# Patient Record
Sex: Female | Born: 1960
Health system: Southern US, Community
[De-identification: ages and names within clinical notes are randomized; demographics above are authoritative.]

## PROBLEM LIST (undated history)

## (undated) DIAGNOSIS — E119 Type 2 diabetes mellitus without complications: Secondary | ICD-10-CM

## (undated) DIAGNOSIS — I10 Essential (primary) hypertension: Secondary | ICD-10-CM

## (undated) DIAGNOSIS — E785 Hyperlipidemia, unspecified: Secondary | ICD-10-CM

## (undated) DIAGNOSIS — Z8719 Personal history of other diseases of the digestive system: Secondary | ICD-10-CM

## (undated) DIAGNOSIS — Z973 Presence of spectacles and contact lenses: Secondary | ICD-10-CM

## (undated) DIAGNOSIS — K589 Irritable bowel syndrome without diarrhea: Secondary | ICD-10-CM

## (undated) DIAGNOSIS — Z86010 Personal history of colonic polyps: Secondary | ICD-10-CM

## (undated) DIAGNOSIS — G4733 Obstructive sleep apnea (adult) (pediatric): Secondary | ICD-10-CM

## (undated) DIAGNOSIS — Z860101 Personal history of adenomatous and serrated colon polyps: Secondary | ICD-10-CM

## (undated) DIAGNOSIS — R112 Nausea with vomiting, unspecified: Secondary | ICD-10-CM

## (undated) DIAGNOSIS — F419 Anxiety disorder, unspecified: Secondary | ICD-10-CM

## (undated) DIAGNOSIS — T7840XA Allergy, unspecified, initial encounter: Secondary | ICD-10-CM

## (undated) DIAGNOSIS — K219 Gastro-esophageal reflux disease without esophagitis: Secondary | ICD-10-CM

## (undated) DIAGNOSIS — M6530 Trigger finger, unspecified finger: Secondary | ICD-10-CM

## (undated) DIAGNOSIS — I499 Cardiac arrhythmia, unspecified: Secondary | ICD-10-CM

## (undated) DIAGNOSIS — E1165 Type 2 diabetes mellitus with hyperglycemia: Secondary | ICD-10-CM

## (undated) DIAGNOSIS — G5602 Carpal tunnel syndrome, left upper limb: Secondary | ICD-10-CM

## (undated) DIAGNOSIS — Z8742 Personal history of other diseases of the female genital tract: Secondary | ICD-10-CM

## (undated) DIAGNOSIS — L121 Cicatricial pemphigoid: Secondary | ICD-10-CM

## (undated) DIAGNOSIS — R911 Solitary pulmonary nodule: Secondary | ICD-10-CM

## (undated) DIAGNOSIS — Z9889 Other specified postprocedural states: Secondary | ICD-10-CM

## (undated) DIAGNOSIS — D3500 Benign neoplasm of unspecified adrenal gland: Secondary | ICD-10-CM

## (undated) HISTORY — DX: Essential (primary) hypertension: I10

## (undated) HISTORY — DX: Cicatricial pemphigoid: L12.1

## (undated) HISTORY — DX: Anxiety disorder, unspecified: F41.9

## (undated) HISTORY — DX: Allergy, unspecified, initial encounter: T78.40XA

## (undated) HISTORY — DX: Hyperlipidemia, unspecified: E78.5

## (undated) HISTORY — PX: POLYPECTOMY: SHX149

## (undated) HISTORY — DX: Cardiac arrhythmia, unspecified: I49.9

---
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
## 1987-11-08 HISTORY — PX: CHOLECYSTECTOMY: SHX55

## 1998-11-07 HISTORY — PX: TUBAL LIGATION: SHX77

## 1999-04-15 ENCOUNTER — Other Ambulatory Visit: Admission: RE | Admit: 1999-04-15 | Discharge: 1999-04-15 | Payer: Self-pay | Admitting: Family Medicine

## 1999-07-29 ENCOUNTER — Other Ambulatory Visit: Admission: RE | Admit: 1999-07-29 | Discharge: 1999-07-29 | Payer: Self-pay | Admitting: Family Medicine

## 2001-07-05 ENCOUNTER — Encounter: Payer: Self-pay | Admitting: Unknown Physician Specialty

## 2001-07-05 ENCOUNTER — Ambulatory Visit (HOSPITAL_COMMUNITY): Admission: RE | Admit: 2001-07-05 | Discharge: 2001-07-05 | Payer: Self-pay | Admitting: Unknown Physician Specialty

## 2002-03-14 ENCOUNTER — Other Ambulatory Visit: Admission: RE | Admit: 2002-03-14 | Discharge: 2002-03-14 | Payer: Self-pay | Admitting: Unknown Physician Specialty

## 2002-11-04 ENCOUNTER — Ambulatory Visit: Admission: RE | Admit: 2002-11-04 | Discharge: 2002-11-04 | Payer: Self-pay | Admitting: Pulmonary Disease

## 2003-01-08 ENCOUNTER — Encounter: Payer: Self-pay | Admitting: Unknown Physician Specialty

## 2003-01-08 ENCOUNTER — Ambulatory Visit (HOSPITAL_COMMUNITY): Admission: RE | Admit: 2003-01-08 | Discharge: 2003-01-08 | Payer: Self-pay | Admitting: Unknown Physician Specialty

## 2003-01-08 ENCOUNTER — Ambulatory Visit (HOSPITAL_COMMUNITY): Admission: RE | Admit: 2003-01-08 | Discharge: 2003-01-08 | Payer: Self-pay | Admitting: Family Medicine

## 2003-01-08 ENCOUNTER — Encounter: Payer: Self-pay | Admitting: Family Medicine

## 2003-04-30 ENCOUNTER — Encounter (HOSPITAL_COMMUNITY): Admission: RE | Admit: 2003-04-30 | Discharge: 2003-05-30 | Payer: Self-pay | Admitting: *Deleted

## 2003-04-30 ENCOUNTER — Encounter: Payer: Self-pay | Admitting: *Deleted

## 2003-04-30 HISTORY — PX: CARDIOVASCULAR STRESS TEST: SHX262

## 2003-04-30 HISTORY — PX: TRANSTHORACIC ECHOCARDIOGRAM: SHX275

## 2004-01-15 ENCOUNTER — Ambulatory Visit (HOSPITAL_COMMUNITY): Admission: RE | Admit: 2004-01-15 | Discharge: 2004-01-15 | Payer: Self-pay | Admitting: Unknown Physician Specialty

## 2004-08-04 ENCOUNTER — Other Ambulatory Visit: Admission: RE | Admit: 2004-08-04 | Discharge: 2004-08-04 | Payer: Self-pay | Admitting: Dermatology

## 2004-10-06 ENCOUNTER — Ambulatory Visit: Payer: Self-pay | Admitting: Family Medicine

## 2004-10-06 ENCOUNTER — Ambulatory Visit (HOSPITAL_COMMUNITY): Admission: RE | Admit: 2004-10-06 | Discharge: 2004-10-06 | Payer: Self-pay | Admitting: Family Medicine

## 2005-01-13 ENCOUNTER — Ambulatory Visit: Payer: Self-pay | Admitting: Family Medicine

## 2005-01-13 ENCOUNTER — Ambulatory Visit (HOSPITAL_COMMUNITY): Admission: RE | Admit: 2005-01-13 | Discharge: 2005-01-13 | Payer: Self-pay | Admitting: Unknown Physician Specialty

## 2005-01-28 ENCOUNTER — Ambulatory Visit (HOSPITAL_COMMUNITY): Admission: RE | Admit: 2005-01-28 | Discharge: 2005-01-28 | Payer: Self-pay | Admitting: Unknown Physician Specialty

## 2005-03-23 ENCOUNTER — Ambulatory Visit: Payer: Self-pay | Admitting: Family Medicine

## 2005-04-27 ENCOUNTER — Ambulatory Visit: Payer: Self-pay | Admitting: Family Medicine

## 2005-06-13 ENCOUNTER — Ambulatory Visit: Payer: Self-pay | Admitting: Cardiology

## 2005-07-25 ENCOUNTER — Ambulatory Visit: Payer: Self-pay | Admitting: Family Medicine

## 2005-11-24 ENCOUNTER — Ambulatory Visit: Payer: Self-pay | Admitting: Family Medicine

## 2005-11-24 ENCOUNTER — Ambulatory Visit (HOSPITAL_COMMUNITY): Admission: RE | Admit: 2005-11-24 | Discharge: 2005-11-24 | Payer: Self-pay | Admitting: *Deleted

## 2005-11-24 ENCOUNTER — Ambulatory Visit: Payer: Self-pay | Admitting: *Deleted

## 2006-04-04 ENCOUNTER — Ambulatory Visit: Payer: Self-pay | Admitting: Family Medicine

## 2006-04-05 ENCOUNTER — Ambulatory Visit (HOSPITAL_COMMUNITY): Admission: RE | Admit: 2006-04-05 | Discharge: 2006-04-05 | Payer: Self-pay | Admitting: Unknown Physician Specialty

## 2006-05-15 ENCOUNTER — Ambulatory Visit: Payer: Self-pay | Admitting: Family Medicine

## 2006-05-16 ENCOUNTER — Ambulatory Visit (HOSPITAL_COMMUNITY): Admission: RE | Admit: 2006-05-16 | Discharge: 2006-05-16 | Payer: Self-pay | Admitting: Family Medicine

## 2006-08-04 ENCOUNTER — Ambulatory Visit: Payer: Self-pay | Admitting: Family Medicine

## 2006-08-23 ENCOUNTER — Encounter: Payer: Self-pay | Admitting: Family Medicine

## 2006-08-24 ENCOUNTER — Ambulatory Visit (HOSPITAL_COMMUNITY): Payer: Self-pay | Admitting: Psychiatry

## 2007-01-03 ENCOUNTER — Ambulatory Visit: Payer: Self-pay | Admitting: Family Medicine

## 2007-01-11 ENCOUNTER — Encounter: Payer: Self-pay | Admitting: Family Medicine

## 2007-01-11 LAB — CONVERTED CEMR LAB
ALT: 30 units/L (ref 0–35)
Albumin: 4.8 g/dL (ref 3.5–5.2)
Alkaline Phosphatase: 65 units/L (ref 39–117)
BUN: 14 mg/dL (ref 6–23)
Bilirubin, Direct: 0.1 mg/dL (ref 0.0–0.3)
CO2: 28 meq/L (ref 19–32)
Chloride: 102 meq/L (ref 96–112)
Cholesterol: 176 mg/dL (ref 0–200)
Hgb A1c MFr Bld: 7.1 % — ABNORMAL HIGH (ref 4.6–6.1)
Indirect Bilirubin: 0.3 mg/dL (ref 0.0–0.9)
LDL Cholesterol: 83 mg/dL (ref 0–99)
Potassium: 4.3 meq/L (ref 3.5–5.3)
Total Protein: 6.8 g/dL (ref 6.0–8.3)
VLDL: 45 mg/dL — ABNORMAL HIGH (ref 0–40)

## 2007-04-03 ENCOUNTER — Ambulatory Visit: Payer: Self-pay | Admitting: Family Medicine

## 2007-07-31 ENCOUNTER — Ambulatory Visit: Payer: Self-pay | Admitting: Family Medicine

## 2007-07-31 LAB — CONVERTED CEMR LAB: Blood Glucose, Fasting: 141 mg/dL

## 2007-12-06 ENCOUNTER — Encounter: Payer: Self-pay | Admitting: Family Medicine

## 2007-12-06 DIAGNOSIS — F411 Generalized anxiety disorder: Secondary | ICD-10-CM

## 2007-12-06 DIAGNOSIS — F419 Anxiety disorder, unspecified: Secondary | ICD-10-CM | POA: Insufficient documentation

## 2007-12-06 DIAGNOSIS — F332 Major depressive disorder, recurrent severe without psychotic features: Secondary | ICD-10-CM | POA: Insufficient documentation

## 2008-01-17 ENCOUNTER — Ambulatory Visit: Payer: Self-pay | Admitting: Family Medicine

## 2008-01-18 ENCOUNTER — Encounter: Payer: Self-pay | Admitting: Family Medicine

## 2008-01-23 ENCOUNTER — Ambulatory Visit (HOSPITAL_COMMUNITY): Admission: RE | Admit: 2008-01-23 | Discharge: 2008-01-23 | Payer: Self-pay | Admitting: Family Medicine

## 2008-01-25 ENCOUNTER — Encounter: Payer: Self-pay | Admitting: Family Medicine

## 2008-01-25 LAB — CONVERTED CEMR LAB
ALT: 42 units/L — ABNORMAL HIGH (ref 0–35)
AST: 23 units/L (ref 0–37)
Albumin: 4.6 g/dL (ref 3.5–5.2)
Alkaline Phosphatase: 75 units/L (ref 39–117)
CO2: 24 meq/L (ref 19–32)
Chloride: 100 meq/L (ref 96–112)
Cholesterol: 269 mg/dL — ABNORMAL HIGH (ref 0–200)
Eosinophils Absolute: 0.2 10*3/uL (ref 0.0–0.7)
Glucose, Bld: 155 mg/dL — ABNORMAL HIGH (ref 70–99)
HCT: 43.2 % (ref 36.0–46.0)
Hemoglobin: 14.3 g/dL (ref 12.0–15.0)
Indirect Bilirubin: 0.4 mg/dL (ref 0.0–0.9)
LDL Cholesterol: 169 mg/dL — ABNORMAL HIGH (ref 0–99)
Monocytes Absolute: 0.7 10*3/uL (ref 0.1–1.0)
Potassium: 4.4 meq/L (ref 3.5–5.3)
RBC: 4.65 M/uL (ref 3.87–5.11)
Sodium: 138 meq/L (ref 135–145)
TSH: 2.028 microintl units/mL (ref 0.350–5.50)
Total Protein: 7.1 g/dL (ref 6.0–8.3)
VLDL: 52 mg/dL — ABNORMAL HIGH (ref 0–40)
WBC: 10.5 10*3/uL (ref 4.0–10.5)

## 2008-03-20 ENCOUNTER — Ambulatory Visit: Payer: Self-pay | Admitting: Family Medicine

## 2008-03-24 ENCOUNTER — Ambulatory Visit: Payer: Self-pay | Admitting: Family Medicine

## 2008-08-11 ENCOUNTER — Encounter: Payer: Self-pay | Admitting: Family Medicine

## 2008-08-26 ENCOUNTER — Encounter: Payer: Self-pay | Admitting: Family Medicine

## 2008-09-15 ENCOUNTER — Ambulatory Visit: Payer: Self-pay | Admitting: Family Medicine

## 2008-09-15 ENCOUNTER — Ambulatory Visit (HOSPITAL_COMMUNITY): Admission: RE | Admit: 2008-09-15 | Discharge: 2008-09-15 | Payer: Self-pay | Admitting: Family Medicine

## 2008-09-15 DIAGNOSIS — J441 Chronic obstructive pulmonary disease with (acute) exacerbation: Secondary | ICD-10-CM | POA: Insufficient documentation

## 2008-09-15 LAB — CONVERTED CEMR LAB
Glucose, Bld: 240 mg/dL
Hgb A1c MFr Bld: 6.8 %

## 2008-09-16 LAB — CONVERTED CEMR LAB
Eosinophils Absolute: 0.3 10*3/uL (ref 0.0–0.7)
Monocytes Absolute: 1 10*3/uL (ref 0.1–1.0)
Monocytes Relative: 8 % (ref 3–12)
RDW: 12.8 % (ref 11.5–15.5)
WBC: 11.7 10*3/uL — ABNORMAL HIGH (ref 4.0–10.5)

## 2008-10-07 ENCOUNTER — Telehealth: Payer: Self-pay | Admitting: Family Medicine

## 2008-12-10 ENCOUNTER — Telehealth: Payer: Self-pay | Admitting: Family Medicine

## 2008-12-19 ENCOUNTER — Ambulatory Visit (HOSPITAL_COMMUNITY): Admission: RE | Admit: 2008-12-19 | Discharge: 2008-12-19 | Payer: Self-pay | Admitting: Family Medicine

## 2009-09-02 ENCOUNTER — Encounter: Payer: Self-pay | Admitting: Family Medicine

## 2009-09-14 ENCOUNTER — Telehealth: Payer: Self-pay | Admitting: Family Medicine

## 2009-10-09 ENCOUNTER — Encounter: Payer: Self-pay | Admitting: Family Medicine

## 2009-10-13 ENCOUNTER — Encounter: Payer: Self-pay | Admitting: Family Medicine

## 2009-10-14 ENCOUNTER — Encounter: Payer: Self-pay | Admitting: Family Medicine

## 2009-10-14 LAB — CONVERTED CEMR LAB
AST: 47 units/L — ABNORMAL HIGH (ref 0–37)
Albumin: 4.6 g/dL (ref 3.5–5.2)
Basophils Relative: 1 % (ref 0–1)
Calcium: 9 mg/dL (ref 8.4–10.5)
Chloride: 101 meq/L (ref 96–112)
Cholesterol: 236 mg/dL — ABNORMAL HIGH (ref 0–200)
Creatinine, Ser: 0.69 mg/dL (ref 0.40–1.20)
Eosinophils Absolute: 0.2 10*3/uL (ref 0.0–0.7)
Eosinophils Relative: 3 % (ref 0–5)
HCT: 42.9 % (ref 36.0–46.0)
HCV Ab: NEGATIVE
Hemoglobin: 14.3 g/dL (ref 12.0–15.0)
LDL Cholesterol: 150 mg/dL — ABNORMAL HIGH (ref 0–99)
MCHC: 33.3 g/dL (ref 30.0–36.0)
MCV: 91.9 fL (ref 78.0–100.0)
Microalb Creat Ratio: 15.6 mg/g (ref 0.0–30.0)
Microalb, Ur: 0.99 mg/dL (ref 0.00–1.89)
Monocytes Absolute: 0.5 10*3/uL (ref 0.1–1.0)
Neutrophils Relative %: 69 % (ref 43–77)
Potassium: 4.3 meq/L (ref 3.5–5.3)
Sodium: 140 meq/L (ref 135–145)
Total CHOL/HDL Ratio: 4.9
Total Protein: 6.7 g/dL (ref 6.0–8.3)
VLDL: 38 mg/dL (ref 0–40)
WBC: 8.1 10*3/uL (ref 4.0–10.5)

## 2009-10-16 ENCOUNTER — Ambulatory Visit: Payer: Self-pay | Admitting: Family Medicine

## 2009-10-16 DIAGNOSIS — R740 Nonspecific elevation of levels of transaminase and lactic acid dehydrogenase [LDH]: Secondary | ICD-10-CM

## 2009-10-16 DIAGNOSIS — R7401 Elevation of levels of liver transaminase levels: Secondary | ICD-10-CM | POA: Insufficient documentation

## 2009-10-16 LAB — CONVERTED CEMR LAB
Glucose, Bld: 159 mg/dL
Hgb A1c MFr Bld: 7.6 %
TSH: 1.349 microintl units/mL (ref 0.350–4.500)

## 2009-10-27 ENCOUNTER — Ambulatory Visit (HOSPITAL_COMMUNITY): Admission: RE | Admit: 2009-10-27 | Discharge: 2009-10-27 | Payer: Self-pay | Admitting: Family Medicine

## 2009-10-27 ENCOUNTER — Encounter: Payer: Self-pay | Admitting: Gastroenterology

## 2009-10-28 ENCOUNTER — Encounter: Payer: Self-pay | Admitting: Gastroenterology

## 2009-11-04 ENCOUNTER — Ambulatory Visit: Payer: Self-pay | Admitting: Gastroenterology

## 2009-11-04 ENCOUNTER — Ambulatory Visit (HOSPITAL_COMMUNITY): Admission: RE | Admit: 2009-11-04 | Discharge: 2009-11-04 | Payer: Self-pay | Admitting: Gastroenterology

## 2009-11-04 HISTORY — PX: COLONOSCOPY: SHX174

## 2010-01-25 ENCOUNTER — Telehealth: Payer: Self-pay | Admitting: Family Medicine

## 2010-04-27 ENCOUNTER — Telehealth: Payer: Self-pay | Admitting: Family Medicine

## 2010-04-30 ENCOUNTER — Ambulatory Visit: Payer: Self-pay | Admitting: Family Medicine

## 2010-04-30 ENCOUNTER — Encounter (INDEPENDENT_AMBULATORY_CARE_PROVIDER_SITE_OTHER): Payer: Self-pay | Admitting: *Deleted

## 2010-04-30 DIAGNOSIS — R0789 Other chest pain: Secondary | ICD-10-CM | POA: Insufficient documentation

## 2010-04-30 DIAGNOSIS — R079 Chest pain, unspecified: Secondary | ICD-10-CM | POA: Insufficient documentation

## 2010-04-30 LAB — CONVERTED CEMR LAB
AST: 32 units/L
AST: 32 units/L
Albumin: 4.6 g/dL
BUN: 13 mg/dL
Bilirubin, Direct: 0.1 mg/dL
CO2: 27 meq/L
CO2: 27 meq/L
Chloride: 101 meq/L
Chloride: 101 meq/L
Cholesterol: 248 mg/dL
HDL: 44 mg/dL
LDL Cholesterol: 161 mg/dL
Potassium: 4.4 meq/L
Sodium: 141 meq/L
Sodium: 141 meq/L
Triglycerides: 217 mg/dL
Triglycerides: 217 mg/dL

## 2010-05-02 DIAGNOSIS — E1169 Type 2 diabetes mellitus with other specified complication: Secondary | ICD-10-CM | POA: Insufficient documentation

## 2010-05-02 DIAGNOSIS — E669 Obesity, unspecified: Secondary | ICD-10-CM

## 2010-05-02 DIAGNOSIS — E1159 Type 2 diabetes mellitus with other circulatory complications: Secondary | ICD-10-CM | POA: Insufficient documentation

## 2010-05-02 DIAGNOSIS — Z87891 Personal history of nicotine dependence: Secondary | ICD-10-CM | POA: Insufficient documentation

## 2010-05-02 DIAGNOSIS — E785 Hyperlipidemia, unspecified: Secondary | ICD-10-CM | POA: Insufficient documentation

## 2010-05-02 DIAGNOSIS — J309 Allergic rhinitis, unspecified: Secondary | ICD-10-CM | POA: Insufficient documentation

## 2010-05-04 ENCOUNTER — Encounter (INDEPENDENT_AMBULATORY_CARE_PROVIDER_SITE_OTHER): Payer: Self-pay | Admitting: *Deleted

## 2010-05-06 ENCOUNTER — Encounter (INDEPENDENT_AMBULATORY_CARE_PROVIDER_SITE_OTHER): Payer: Self-pay | Admitting: *Deleted

## 2010-05-06 ENCOUNTER — Telehealth: Payer: Self-pay | Admitting: Family Medicine

## 2010-05-06 ENCOUNTER — Ambulatory Visit: Payer: Self-pay | Admitting: Cardiology

## 2010-05-06 DIAGNOSIS — E782 Mixed hyperlipidemia: Secondary | ICD-10-CM | POA: Insufficient documentation

## 2010-05-06 LAB — CONVERTED CEMR LAB
ALT: 54 units/L — ABNORMAL HIGH (ref 0–35)
AST: 32 units/L (ref 0–37)
Albumin: 4.6 g/dL (ref 3.5–5.2)
BUN: 13 mg/dL (ref 6–23)
CO2: 27 meq/L (ref 19–32)
Calcium: 9.4 mg/dL (ref 8.4–10.5)
Chloride: 101 meq/L (ref 96–112)
Glucose, Bld: 107 mg/dL — ABNORMAL HIGH (ref 70–99)
HDL: 44 mg/dL (ref >39)
Hgb A1c MFr Bld: 7.1 % — ABNORMAL HIGH (ref <5.7)
Indirect Bilirubin: 0.2 mg/dL (ref 0.0–0.9)
LDL Cholesterol: 161 mg/dL — ABNORMAL HIGH (ref 0–99)
Total Bilirubin: 0.3 mg/dL (ref 0.3–1.2)
Total Protein: 6.8 g/dL (ref 6.0–8.3)

## 2010-05-17 ENCOUNTER — Ambulatory Visit (HOSPITAL_COMMUNITY): Admission: RE | Admit: 2010-05-17 | Discharge: 2010-05-17 | Payer: Self-pay | Admitting: Cardiology

## 2010-05-17 ENCOUNTER — Encounter: Payer: Self-pay | Admitting: Cardiology

## 2010-05-17 ENCOUNTER — Ambulatory Visit: Payer: Self-pay | Admitting: Cardiology

## 2010-06-15 ENCOUNTER — Ambulatory Visit: Payer: Self-pay | Admitting: Psychology

## 2010-06-23 ENCOUNTER — Encounter (INDEPENDENT_AMBULATORY_CARE_PROVIDER_SITE_OTHER): Payer: Self-pay | Admitting: *Deleted

## 2010-06-24 ENCOUNTER — Ambulatory Visit: Payer: Self-pay | Admitting: Cardiology

## 2010-06-24 ENCOUNTER — Encounter (INDEPENDENT_AMBULATORY_CARE_PROVIDER_SITE_OTHER): Payer: Self-pay | Admitting: *Deleted

## 2010-07-02 ENCOUNTER — Ambulatory Visit: Payer: Self-pay | Admitting: Psychology

## 2010-07-30 ENCOUNTER — Ambulatory Visit: Payer: Self-pay | Admitting: Family Medicine

## 2010-08-02 ENCOUNTER — Encounter: Payer: Self-pay | Admitting: Family Medicine

## 2010-08-02 LAB — CONVERTED CEMR LAB: Hepatitis B Surface Ag: NEGATIVE

## 2010-08-03 LAB — CONVERTED CEMR LAB
ALT: 78 units/L — ABNORMAL HIGH (ref 0–35)
AST: 60 units/L — ABNORMAL HIGH (ref 0–37)
BUN: 12 mg/dL (ref 6–23)
Bilirubin, Direct: 0.1 mg/dL (ref 0.0–0.3)
Calcium: 9.2 mg/dL (ref 8.4–10.5)
Chloride: 100 meq/L (ref 96–112)
Cholesterol: 266 mg/dL — ABNORMAL HIGH (ref 0–200)
Creatinine, Ser: 0.68 mg/dL (ref 0.40–1.20)
HDL: 51 mg/dL (ref >39)
Hgb A1c MFr Bld: 7.9 % — ABNORMAL HIGH (ref <5.7)
Potassium: 4.1 meq/L (ref 3.5–5.3)
Total Bilirubin: 0.5 mg/dL (ref 0.3–1.2)
Total CHOL/HDL Ratio: 5.2
Triglycerides: 238 mg/dL — ABNORMAL HIGH (ref <150)

## 2010-08-04 ENCOUNTER — Telehealth: Payer: Self-pay | Admitting: Family Medicine

## 2010-08-09 ENCOUNTER — Encounter: Payer: Self-pay | Admitting: Family Medicine

## 2010-08-18 ENCOUNTER — Ambulatory Visit: Payer: Self-pay | Admitting: Family Medicine

## 2010-08-18 DIAGNOSIS — B009 Herpesviral infection, unspecified: Secondary | ICD-10-CM | POA: Insufficient documentation

## 2010-08-20 ENCOUNTER — Encounter: Payer: Self-pay | Admitting: Family Medicine

## 2010-08-23 ENCOUNTER — Encounter (INDEPENDENT_AMBULATORY_CARE_PROVIDER_SITE_OTHER): Payer: Self-pay | Admitting: *Deleted

## 2010-10-05 ENCOUNTER — Encounter (INDEPENDENT_AMBULATORY_CARE_PROVIDER_SITE_OTHER): Payer: Self-pay | Admitting: *Deleted

## 2010-11-02 ENCOUNTER — Telehealth: Payer: Self-pay | Admitting: Family Medicine

## 2010-11-04 ENCOUNTER — Ambulatory Visit: Payer: Self-pay | Admitting: Family Medicine

## 2010-11-05 ENCOUNTER — Encounter: Payer: Self-pay | Admitting: Family Medicine

## 2010-11-05 LAB — CONVERTED CEMR LAB
ALT: 64 units/L — ABNORMAL HIGH (ref 0–35)
AST: 36 units/L (ref 0–37)
Albumin: 4.7 g/dL (ref 3.5–5.2)
Alkaline Phosphatase: 72 units/L (ref 39–117)
BUN: 10 mg/dL (ref 6–23)
Basophils Absolute: 0.1 10*3/uL (ref 0.0–0.1)
Basophils Relative: 1 % (ref 0–1)
Bilirubin, Direct: 0.1 mg/dL (ref 0.0–0.3)
CO2: 27 meq/L (ref 19–32)
Calcium: 10 mg/dL (ref 8.4–10.5)
Chloride: 100 meq/L (ref 96–112)
Cholesterol: 245 mg/dL — ABNORMAL HIGH (ref 0–200)
Creatinine, Ser: 0.63 mg/dL (ref 0.40–1.20)
Creatinine, Urine: 87.3 mg/dL
Eosinophils Absolute: 0.2 10*3/uL (ref 0.0–0.7)
Eosinophils Relative: 2 % (ref 0–5)
Glucose, Bld: 143 mg/dL — ABNORMAL HIGH (ref 70–99)
HCT: 42.4 % (ref 36.0–46.0)
HDL: 50 mg/dL (ref >39)
Hemoglobin: 13.8 g/dL (ref 12.0–15.0)
Indirect Bilirubin: 0.4 mg/dL (ref 0.0–0.9)
LDL Cholesterol: 155 mg/dL — ABNORMAL HIGH (ref 0–99)
Lymphocytes Relative: 20 % (ref 12–46)
Lymphs Abs: 2 10*3/uL (ref 0.7–4.0)
MCHC: 32.5 g/dL (ref 30.0–36.0)
MCV: 94.6 fL (ref 78.0–100.0)
Microalb Creat Ratio: 9.4 mg/g (ref 0.0–30.0)
Microalb, Ur: 0.82 mg/dL (ref 0.00–1.89)
Monocytes Absolute: 0.6 10*3/uL (ref 0.1–1.0)
Monocytes Relative: 7 % (ref 3–12)
Neutro Abs: 7 10*3/uL (ref 1.7–7.7)
Neutrophils Relative %: 71 % (ref 43–77)
Platelets: 375 10*3/uL (ref 150–400)
Potassium: 4.3 meq/L (ref 3.5–5.3)
RBC: 4.48 M/uL (ref 3.87–5.11)
RDW: 14.4 % (ref 11.5–15.5)
Sodium: 138 meq/L (ref 135–145)
TSH: 1.344 microintl units/mL (ref 0.350–4.500)
Total Bilirubin: 0.5 mg/dL (ref 0.3–1.2)
Total CHOL/HDL Ratio: 4.9
Total Protein: 7.1 g/dL (ref 6.0–8.3)
Triglycerides: 198 mg/dL — ABNORMAL HIGH (ref <150)
VLDL: 40 mg/dL (ref 0–40)
WBC: 9.9 10*3/uL (ref 4.0–10.5)

## 2010-11-09 ENCOUNTER — Ambulatory Visit (HOSPITAL_COMMUNITY): Admission: RE | Admit: 2010-11-09 | Payer: Self-pay | Source: Home / Self Care | Admitting: Family Medicine

## 2010-11-09 ENCOUNTER — Encounter: Payer: Self-pay | Admitting: Family Medicine

## 2010-11-09 LAB — CONVERTED CEMR LAB: Hgb A1c MFr Bld: 8.7 % — ABNORMAL HIGH (ref <5.7)

## 2010-11-18 ENCOUNTER — Encounter: Payer: Self-pay | Admitting: Family Medicine

## 2010-11-28 ENCOUNTER — Encounter: Payer: Self-pay | Admitting: *Deleted

## 2010-11-28 ENCOUNTER — Encounter: Payer: Self-pay | Admitting: Family Medicine

## 2010-11-28 ENCOUNTER — Encounter: Payer: Self-pay | Admitting: Unknown Physician Specialty

## 2010-12-02 ENCOUNTER — Encounter: Payer: Self-pay | Admitting: Family Medicine

## 2010-12-07 ENCOUNTER — Encounter: Payer: Self-pay | Admitting: Family Medicine

## 2010-12-07 NOTE — Miscellaneous (Signed)
Summary: labs bmp,lipids,liver,hemoglobin,04/30/2010  Clinical Lists Changes  Observations: Added new observation of CALCIUM: 9.4 mg/dL (47/82/9562 13:08) Added new observation of ALBUMIN: 4.6 g/dL (65/78/4696 29:52) Added new observation of PROTEIN, TOT: 6.8 g/dL (84/13/2440 10:27) Added new observation of SGPT (ALT): 54 units/L (04/30/2010 16:24) Added new observation of SGOT (AST): 32 units/L (04/30/2010 16:24) Added new observation of ALK PHOS: 63 units/L (04/30/2010 16:24) Added new observation of BILI DIRECT: 0.1 mg/dL (25/36/6440 34:74) Added new observation of CREATININE: 0.74 mg/dL (25/95/6387 56:43) Added new observation of BUN: 13 mg/dL (32/95/1884 16:60) Added new observation of BG RANDOM: 107 mg/dL (63/11/6008 93:23) Added new observation of CO2 PLSM/SER: 27 meq/L (04/30/2010 16:24) Added new observation of CL SERUM: 101 meq/L (04/30/2010 16:24) Added new observation of K SERUM: 4.4 meq/L (04/30/2010 16:24) Added new observation of NA: 141 meq/L (04/30/2010 16:24) Added new observation of LDL: 161 mg/dL (55/73/2202 54:27) Added new observation of HDL: 44 mg/dL (04/30/7627 31:51) Added new observation of TRIGLYC TOT: 217 mg/dL (76/16/0737 10:62) Added new observation of CHOLESTEROL: 248 mg/dL (69/48/5462 70:35) Added new observation of HGBA1C: 7.1 % (04/30/2010 16:24)

## 2010-12-07 NOTE — Letter (Signed)
Summary: Stress Echocardiogram Information Sheet  [facility]  [address] [place] [address]   Phone: [phone]  Fax: [phone]      May 06, 2010 MRN: [id number] light prior to the test.   Alicia Neal  Doctor: Appointment Date: Appointment Time: Appointment Location: [facility]  Stress Echocardiogram Information Sheet    Instructions:   1. DO NOT  take your am  medicine morning  before the test.  2. Nothing to eat or drink after midnight  3. Dress prepared to exercise.  4. DO NOT use ANY caffine or tobacco products 3 hours before appointment.  5. Report to the Short Stay Center on the1st floor.  6. Please bring all current prescription medications.  7. If you have any questions, please call [phone]

## 2010-12-07 NOTE — Miscellaneous (Signed)
Summary: echo 05/17/2010  Clinical Lists Changes  Observations: Added new observation of ECHOINTERP:  Study Conclusions    - Stress ECG conclusions: There were no stress arrhythmias or     conduction abnormalities. Insignificant upsloping ST segment     depression present. The stress ECG was negative for ischemia.   - Staged echo: There was no echocardiographic evidence for     stress-induced ischemia.   Bruce protocol. Stress echocardiography. Height: Height: 162.6cm.   Height: 64in. Weight: Weight: 85.5kg. Weight: 188lb. Body mass   index: BMI: 32.3kg/m^2. Body surface area: BSA: 16m^2. Blood   pressure: 140/82. Patient status: Outpatient.    --------------------------------------------------------------------  (05/17/2010 14:32) Added new observation of COLONOSCOPY:  RECOMMENDATIONS:   1. Will call Ms. Groeneveld with the results of her biopsies.   2. She should follow a high-fiber and low-fat diet.  She is given a       handout on high-fiber and low-fat diet, polyps, hemorrhoids,       gastritis, and reflux.   3. Screening colonoscopy in 10 years with propofol and Entrada       overtube.   4. No aspirin, NSAIDs or anticoagulation for 7 days.   5. Add omeprazole 20 mg 30 minutes prior to her first meal.      MEDICATIONS:   1. Demerol 125 mg IV.   2. Versed 8 mg IV.      DESCRIPTION OF PROCEDURE:  Physical exam was performed.  Informed   consent was obtained for the patient after explaining the benefits,   risks and alternatives to the procedure.  The patient was connected to   monitor placed in left lateral position.  Continuous oxygen was provided   by nasal cannula and IV medicine administered through an indwelling   cannula.  After administration of sedation and rectal exam, the   patient's rectum is intubated.  The scope was advanced under direct   visualization to the cecum.  Due to inability to straighten the scope to   deploy a snare to remove the cecal polyp, the scope  was withdrawn.  The   Entrada overtube was  placed over the scope.  The scope was advanced   under direct visualization to 70 cm from the anal verge.  The Entrada   tube was inserted without resistance.  The scope was advanced to the   cecum.  The polyps were removed.  The scope was withdrawn slowly by   carefully examine the color, texture, anatomy and integrity of the   mucosa on the way out.  When the scope was withdrawn to 80 cm the   Entrada overtube was withdrawn.  The colonoscope exam was complete.  The   patient was recovered in endoscopy and discharged to home in   satisfactory condition.      PATH: SERRATED ADENOMAS. NORMAL COLON AND DUODENAL BIOPSIES. MILD GASTRITIS.   (12/01/2009 14:33)      Echocardiogram  Procedure date:  05/17/2010  Findings:       Study Conclusions    - Stress ECG conclusions: There were no stress arrhythmias or     conduction abnormalities. Insignificant upsloping ST segment     depression present. The stress ECG was negative for ischemia.   - Staged echo: There was no echocardiographic evidence for     stress-induced ischemia.   Bruce protocol. Stress echocardiography. Height: Height: 162.6cm.   Height: 64in. Weight: Weight: 85.5kg. Weight: 188lb. Body mass   index: BMI: 32.3kg/m^2. Body surface area:  BSA: 84m^2. Blood   pressure: 140/82. Patient status: Outpatient.    --------------------------------------------------------------------   Colonoscopy  Procedure date:  12/01/2009  Findings:       RECOMMENDATIONS:   1. Will call Ms. Worthing with the results of her biopsies.   2. She should follow a high-fiber and low-fat diet.  She is given a       handout on high-fiber and low-fat diet, polyps, hemorrhoids,       gastritis, and reflux.   3. Screening colonoscopy in 10 years with propofol and Entrada       overtube.   4. No aspirin, NSAIDs or anticoagulation for 7 days.   5. Add omeprazole 20 mg 30 minutes prior to her first meal.        MEDICATIONS:   1. Demerol 125 mg IV.   2. Versed 8 mg IV.      DESCRIPTION OF PROCEDURE:  Physical exam was performed.  Informed   consent was obtained for the patient after explaining the benefits,   risks and alternatives to the procedure.  The patient was connected to   monitor placed in left lateral position.  Continuous oxygen was provided   by nasal cannula and IV medicine administered through an indwelling   cannula.  After administration of sedation and rectal exam, the   patient's rectum is intubated.  The scope was advanced under direct   visualization to the cecum.  Due to inability to straighten the scope to   deploy a snare to remove the cecal polyp, the scope was withdrawn.  The   Entrada overtube was  placed over the scope.  The scope was advanced   under direct visualization to 70 cm from the anal verge.  The Entrada   tube was inserted without resistance.  The scope was advanced to the   cecum.  The polyps were removed.  The scope was withdrawn slowly by   carefully examine the color, texture, anatomy and integrity of the   mucosa on the way out.  When the scope was withdrawn to 80 cm the   Entrada overtube was withdrawn.  The colonoscope exam was complete.  The   patient was recovered in endoscopy and discharged to home in   satisfactory condition.      PATH: SERRATED ADENOMAS. NORMAL COLON AND DUODENAL BIOPSIES. MILD GASTRITIS.

## 2010-12-07 NOTE — Letter (Signed)
Summary: Appointment - Missed   [facility] [place]  [address], [place] [address]   Phone: [phone]  Fax: [phone]     October 05, 2010 MRN: [id number]   [facility] [address] [place], [place]  [address]   Dear Ms. Alen,  Our records indicate you missed your appointment on   10/05/10 NURSE VISIT      .                                    It is very important that we reach you to reschedule this appointment. We look forward to participating in your health care needs. Please contact us at the number listed above at your earliest convenience to reschedule this appointment.     Sincerely,    Glass blower/designer

## 2010-12-07 NOTE — Letter (Signed)
Summary: Appointment - Missed  [facility] [place]  [address], [place] [address]   Phone: [phone]  Fax: [phone]     August 23, 2010 MRN: [id number]   [facility] [address] [place], [place]  [address]   Dear Ms. Forcier,  Our records indicate you missed your appointment on         08/23/10 NURSE VISIT     .                                    It is very important that we reach you to reschedule this appointment. We look forward to participating in your health care needs. Please contact us at the number listed above at your earliest convenience to reschedule this appointment.     Sincerely,    Glass blower/designer

## 2010-12-07 NOTE — Assessment & Plan Note (Signed)
Summary: office visit   Vital Signs:  Patient profile:   50 year old female Height:      61.5 inches Weight:      192.50 pounds BMI:     35.91 O2 Sat:      94 % Pulse rate:   86 / minute Pulse rhythm:   regular Resp:     16 per minute BP sitting:   130 / 90  (right arm)  Vitals Entered By: Everitt Amber LPN (July 30, 2010 8:29 AM)  Nutrition Counseling: Patient's BMI is greater than 25 and therefore counseled on weight management options. CC: Follow up chronic problems   Primary Care Nandi Tonnesen:  Syliva Overman MD  CC:  Follow up chronic problems.  History of Present Illness: Reports  that she feels as though she has made a little progress. She did attend smoking cessation classes, and had quit with the patch, this past week while on vacation, she will again stop next week. She did go to a therapist, can't see the benefit , she is not depressed, just feels foolish and frustrated. Denies recent fever or chills. Denies sinus pressure, nasal congestion , ear pain or sore throat. Denies chest congestion, or cough productive of sputum. Denies chest pain, palpitations, PND, orthopnea or leg swelling. Denies abdominal pain, nausea, vomitting, diarrhea or constipation. Denies change in bowel movements or bloody stool. Denies dysuria , frequency, incontinence or hesitancy. Denies  joint pain, swelling, or reduced mobility. Denies headaches, vertigo, seizures. Denies depression, anxiety or insomnia. Denies  rash, lesions, or itch. She has started walking her dog also     Preventive Screening-Counseling & Management  Alcohol-Tobacco     Smoking Cessation Counseling: yes  Current Medications (verified): 1)  Celexa 40 Mg  Tabs (Citalopram Hydrobromide) .... Take One Tablet By Mouth At Bedtime 2)  Singulair 10 Mg  Tabs (Montelukast Sodium) .... Take One Tablet By Mouth At Bedtime 3)  Janumet 50-1000 Mg Tabs (Sitagliptin-Metformin Hcl) .... Take 1 Tablet By Mouth Two Times A  Day 4)  Flonase 50 Mcg/act Susp (Fluticasone Propionate) .... One Puff Per Nostril Daily 5)  Questran 4 Gm/dose Powd (Cholestyramine) .... Take 8 Mg Two Times A Day With Meals  in Water or Juice 6)  Losartan Potassium-Hctz 100-12.5 Mg Tabs (Losartan Potassium-Hctz) .... Take 1 Tablet By Mouth Once A Day  Allergies (verified): 1)  ! Zocor 2)  ! Vytorin (Ezetimibe-Simvastatin)  Review of Systems      See HPI General:  Complains of fatigue. Eyes:  Denies blurring and discharge. MS:  Complains of low back pain and mid back pain. Derm:  Complains of lesion(s) and rash; 6 week h/o rash across the forhead, blue star ointmenti. Endo:  Denies excessive thirst and excessive urination; not testing regulalrly. Heme:  Denies abnormal bruising and bleeding. Allergy:  Denies hives or rash and itching eyes.  Physical Exam  General:  Well-developed,obese,in no acute distress; alert,appropriate and cooperative throughout examination HEENT: No facial asymmetry,  EOMI, No sinus tenderness, TM's Clear, oropharynx  pink and moist.   Chest: decreased air entry, no crackles or wheezes CVS: S1, S2, No murmurs, No S3.   Abd: Soft, Nontender.  MS: Adequate ROM spine, hips, shoulders and knees.  Ext: No edema.   CNS: CN 2-12 intact, power tone and sensation normal throughout.   Skin: Intact, no visible lesions or rashes.  Psych: Good eye contact, normal affect.  Memory intact, not anxious or depressed appearing.    Impression & Recommendations:  Problem # 1:  HYPERTENSION (ICD-401.1) Assessment Improved  Her updated medication list for this problem includes:    Losartan Potassium-hctz 100-12.5 Mg Tabs (Losartan potassium-hctz) .Marland Kitchen... Take 1 tablet by mouth once a day  Orders: T-Basic Metabolic Panel [phone])  BP today: 130/90 Prior BP: 144/89 (06/24/2010)  Labs Reviewed: K+: 4.4 (04/30/2010) Creat: : 0.74 (04/30/2010)   Chol: 248 (04/30/2010)   HDL: 44 (04/30/2010)   LDL: 161 (04/30/2010)    TG: 217 (04/30/2010)  Problem # 2:  HYPERLIPIDEMIA (ICD-272.4) Assessment: Comment Only  Her updated medication list for this problem includes:    Questran 4 Gm/dose Powd (Cholestyramine) .Marland Kitchen... Take 8 mg two times a day with meals  in water or juice, has been using welchol to date Low fat dietdiscussed and encouraged  Orders: T-Hepatic Function [phone]) T-Lipid Profile [phone])  Labs Reviewed: SGOT: 32 (04/30/2010)   SGPT: 54 (04/30/2010)   HDL:44 (04/30/2010), 44 (04/30/2010)  LDL:161 (04/30/2010), 161 (04/30/2010)  Chol:248 (04/30/2010), 248 (04/30/2010)  Trig:217 (04/30/2010), 217 (04/30/2010)  Problem # 3:  TOBACCO ABUSE (ICD-305.1) Assessment: Improved  Encouraged smoking cessation and discussed different methods for smoking cessation.   Problem # 4:  OBESITY (ICD-278.00) Assessment: Deteriorated  Ht: 61.5 (07/30/2010)   Wt: 192.50 (07/30/2010)   BMI: 35.91 (07/30/2010) therapeutic lifestyle change discussed and encouraged  Problem # 5:  DIABETES MELLITUS, TYPE II (ICD-250.00) Assessment: Comment Only  Her updated medication list for this problem includes:    Janumet 50-1000 Mg Tabs (Sitagliptin-metformin hcl) .Marland Kitchen... Take 1 tablet by mouth two times a day    Losartan Potassium-hctz 100-12.5 Mg Tabs (Losartan potassium-hctz) .Marland Kitchen... Take 1 tablet by mouth once a day  Orders: T- Hemoglobin A1C ([id number]) Patient advised to reduce carbs and sweets, commit to regular physical activity, take meds as prescribed, test blood sugars as directed, and attempt to lose weight , to improve blood sugar control.  Labs Reviewed: Creat: 0.74 (04/30/2010)    Reviewed HgBA1c results: 7.1 (04/30/2010)  7.1 (04/30/2010)  Complete Medication List: 1)  Celexa 40 Mg Tabs (Citalopram hydrobromide) .... Take one tablet by mouth at bedtime 2)  Singulair 10 Mg Tabs (Montelukast sodium) .... Take one tablet by mouth at bedtime 3)  Janumet 50-1000 Mg Tabs (Sitagliptin-metformin hcl)  .... Take 1 tablet by mouth two times a day 4)  Flonase 50 Mcg/act Susp (Fluticasone propionate) .... One puff per nostril daily 5)  Questran 4 Gm/dose Powd (Cholestyramine) .... Take 8 mg two times a day with meals  in water or juice 6)  Losartan Potassium-hctz 100-12.5 Mg Tabs (Losartan potassium-hctz) .... Take 1 tablet by mouth once a day  Patient Instructions: 1)  Please schedule a follow-up appointment in 3 months. 2)  Tobacco is very bad for your health and your loved ones! You Should stop smoking!. 3)  Stop Smoking Tips: Choose a Quit date. Cut down before the Quit date. decide what you will do as a substitute when you feel the urge to smoke(gum,toothpick,exercise). 4)  It is important that you exercise regularly at least 20 minutes 5 times a week. If you develop chest pain, have severe difficulty breathing, or feel very tired , stop exercising immediately and seek medical attention. 5)  You need to lose weight. Consider a lower calorie diet and regular exercise.  6)  BMP prior to visit, ICD-9: 7)  Hepatic Panel prior to visit, ICD-9: 8)  Lipid Panel prior to visit, ICD-9:  fasting today 9)  HbgA1C prior to visit, ICD-9: 10)  BMP prior  to visit, ICD-9: 11)  Hepatic Panel prior to visit, ICD-9: 12)  Lipid Panel prior to visit, ICD-9:  fasting labs 13)  HbgA1C prior to visit, ICD-9:

## 2010-12-07 NOTE — Miscellaneous (Signed)
Summary: LABS BMP,LIPIDS,LIVER,04/30/2010  Clinical Lists Changes  Observations: Added new observation of CALCIUM: 9.4 mg/dL (16/08/9603 54:09) Added new observation of ALBUMIN: 4.6 g/dL (81/19/1478 29:56) Added new observation of PROTEIN, TOT: 6.8 g/dL (21/30/8657 84:69) Added new observation of SGPT (ALT): 54 units/L (04/30/2010 14:57) Added new observation of SGOT (AST): 32 units/L (04/30/2010 14:57) Added new observation of ALK PHOS: 63 units/L (04/30/2010 14:57) Added new observation of CREATININE: 0.74 mg/dL (62/95/2841 32:44) Added new observation of BUN: 13 mg/dL (11/09/7251 66:44) Added new observation of BG RANDOM: 107 mg/dL (03/47/4259 56:38) Added new observation of CO2 PLSM/SER: 27 meq/L (04/30/2010 14:57) Added new observation of CL SERUM: 101 meq/L (04/30/2010 14:57) Added new observation of K SERUM: 4.4 meq/L (04/30/2010 14:57) Added new observation of NA: 141 meq/L (04/30/2010 14:57) Added new observation of LDL: 161 mg/dL (75/64/3329 51:88) Added new observation of HDL: 44 mg/dL (41/66/0630 16:01) Added new observation of TRIGLYC TOT: 217 mg/dL (09/32/3557 32:20) Added new observation of CHOLESTEROL: 248 mg/dL (25/42/7062 37:62)

## 2010-12-07 NOTE — Progress Notes (Signed)
Summary: refills  Phone Note Call from Patient   Summary of Call: needs to get junmet and celexa and [facility] [phone] Initial call taken by: Rudene Anda,  April 27, 2010 1:56 PM  Follow-up for Phone Call        patient to make appt for this week per dr simpson patient aware Follow-up by: Adella Hare LPN,  April 27, 2010 2:05 PM

## 2010-12-07 NOTE — Letter (Signed)
Summary: [facility] [employer]  [address] [place] [address]   Phone: [phone]  Fax: [phone]     June 24, 2010 MRN: [id number]   [facility] [address] [place], [place]  [address]      YOUR LAB WORK IS DUE   July 26, 2010  Please go to [facility], located across the street from [facility] on the second floor.  Hours are Monday - Friday 7am until 7:30pm         Saturday 8am until 12noon    _X_  DO NOT EAT OR DRINK AFTER MIDNIGHT EVENING PRIOR TO LABWORK  __ YOUR LABWORK IS NOT FASTING --YOU MAY EAT PRIOR TO LABWORK

## 2010-12-07 NOTE — Progress Notes (Signed)
Summary: [facility] SEND ALL RX  Phone Note Call from Patient   Summary of Call: SEND ALL MEDICATIONS TO [facility]. WAS SUPPOSED TO HAVE BEEN DONE LAST FRIDAY OUT OF HER MEDS Initial call taken by: Lind Guest,  August 04, 2010 4:27 PM    Prescriptions: LOSARTAN POTASSIUM-HCTZ 100-12.5 MG TABS (LOSARTAN POTASSIUM-HCTZ) Take 1 tablet by mouth once a day  #90 x 2   Entered by:   Everitt Amber LPN   Authorized by:   Syliva Overman MD   Signed by:   Everitt Amber LPN on 16/08/9603   Method used:   Electronically to        [facility]* (retail)       [address]       [address] Shipping/mailing       [place], [place]  [address]       Ph: [phone]       Fax: [phone]   RxID:   [id number] JANUMET 50-1000 MG TABS (SITAGLIPTIN-METFORMIN HCL) Take 1 tablet by mouth two times a day  #180 x 2   Entered by:   Everitt Amber LPN   Authorized by:   Syliva Overman MD   Signed by:   Everitt Amber LPN on 09/03/2535   Method used:   Electronically to        [facility]* (retail)       [address]       [address] Shipping/mailing       [place], [place]  [address]       Ph: [phone]       Fax: [phone]   RxID:   [id number] SINGULAIR 10 MG  TABS (MONTELUKAST SODIUM) Take one tablet by mouth at bedtime  #90 x 2   Entered by:   Everitt Amber LPN   Authorized by:   Syliva Overman MD   Signed by:   Everitt Amber LPN on 32/35/5732   Method used:   Electronically to        [facility]* (retail)       [address]       [address] Shipping/mailing       [place], [place]  [address]       Ph: [phone]       Fax: [phone]   RxID:   [id number] CELEXA 40 MG  TABS (CITALOPRAM HYDROBROMIDE) Take one tablet by mouth at bedtime  #90 x 2   Entered by:   Everitt Amber LPN   Authorized by:   Syliva Overman MD   Signed by:   Everitt Amber LPN on 35/00/9381   Method used:   Electronically to       [facility]* (retail)       [address]       [address] Shipping/mailing       [place], [place]  [address]       Ph: [phone]       Fax: [phone]   RxID:   [id number]

## 2010-12-07 NOTE — Assessment & Plan Note (Signed)
Summary: 7 WK F/U PER CHECKOUT ON 05/06/10/TG   Visit Type:  Follow-up Referring Dyanara Cozza:  Psychology-Dr. Dellia Cloud Primary Camiyah Friberg:  Syliva Overman MD   History of Present Illness: Alicia Neal has done well from a symptomatic standpoint since I last saw her 2 months ago.  She is walking her dog on a daily basis without difficulty.  She has been evaluated by Dr. Dellia Cloud and is continuing treatment with him.  She discontinued her ACE inhibitor due to cough.  She attends the smoking cessation class where a nicotine taper is in progress.  She has difficulty with WelChol because of the large size of the pills.  As a result, she has been taking less than the recommended dosage, specifically one tablet b.i.d.  Current Medications (verified): 1)  Celexa 40 Mg  Tabs (Citalopram Hydrobromide) .... Take One Tablet By Mouth At Bedtime 2)  Singulair 10 Mg  Tabs (Montelukast Sodium) .... Take One Tablet By Mouth At Bedtime 3)  Janumet 50-1000 Mg Tabs (Sitagliptin-Metformin Hcl) .... Take 1 Tablet By Mouth Two Times A Day 4)  Flonase 50 Mcg/act Susp (Fluticasone Propionate) .... One Puff Per Nostril Daily 5)  Questran 4 Gm/dose Powd (Cholestyramine) .... Take 8 Mg Two Times A Day With Meals  in Water or Juice 6)  Losartan Potassium-Hctz 100-12.5 Mg Tabs (Losartan Potassium-Hctz) .... Take 1 Tablet By Mouth Once A Day  Allergies (verified): 1)  ! Zocor 2)  ! Vytorin (Ezetimibe-Simvastatin)  Past History:  PMH, FH, and Social History reviewed and updated.  Past Medical History: Hypertension Hyperlipidemia AODM Tobacco abuse-40 pack years OBESITY: BMI of 35.1 Adenomatous polyps of the colon-10/2009 ALLERGIC RHINITIS, SEASONAL (ICD-477.0) ANXIETY DISORDER (ICD-300.00)/DEPRESSION, CHRONIC-Dr. Dellia Cloud  Review of Systems       See history of present illness.  Vital Signs:  Patient profile:   50 year old female Weight:      190 pounds Pulse rate:   89 / minute BP sitting:    144 / 89  (right arm)  Vitals Entered By: Dreama Saa, CNA (June 24, 2010 1:11 PM)  Physical Exam  General:  Obese; well-developed; no acute distress;  HEENT-[place]/AT; PERRL; EOM intact; conjunctiva and lids nl:  Neck-No JVD; no carotid bruits: Endocrine-No thyromegaly: Lungs-No tachypnea, clear; no rales nor wheezes CV-normal PMI; normal S1 and S2; modest systolic ejection murmur Abdomen-BS normal; soft and non-tender without masses or organomegaly: MS-No deformities, cyanosis or clubbing: Neurologic-Nl cranial nerves; symmetric strength and tone: Skin- Warm, no sig. lesions: Extremities-Nl distal pulses; no edema    Impression & Recommendations:  Problem # 1:  HYPERTENSION (ICD-401.1) Blood pressure control is suboptimal for a diabetic.  Losartan/HCT 100/12.5 mg will be substituted for lisinopril/HCT.  Patient will follow her own blood pressure and return in 2 months for assessment by the cardiology nurses.  Problem # 2:  HYPERLIPIDEMIA (ICD-272.4) Her treatment will be switched to cholestyramine 8 g b.i.d. with a repeat lipid profile in 2 months.  Problem # 3:  TOBACCO ABUSE (ICD-305.1) Patient is not at all sanguine about her likelihood of discontinuing tobacco use.  She was encouraged to persist with her program and to call if she is tempted to smoke after her quit date of 8/30.  I will plan a return office visit in 6 months and try to monitor Alicia Neal's therapy in the interim.  Other Orders: Future Orders: T-Basic Metabolic Panel [phone]) ... 07/26/2010 T-Lipid Profile [phone]) ... 07/26/2010  Patient Instructions: 1)  Your physician recommends that you schedule  a follow-up appointment in: 6 months 2)  Your physician recommends that you return for lab work in: 1 month 3)  Your physician has recommended you make the following change in your medication:  stop lisinopril/hct, losartan/hct 100/12.5 Take 1 tablet by mouth once a day, stop welchol and start  cholestyramine 8mg  two times a day with meals in water or juice 4)  You have been referred to nurse visit in 2 months 5)  Your physician has requested that you regularly monitor and record your blood pressure readings at home.  Please use the same machine at the same time of day to check your readings and record them to bring to your follow-up visit. 6)  Your physician discussed the importance of regular exercise and recommended that you start or continue a regular exercise program for good health. continue walking 7)  Your physician discussed the hazards of tobacco use.  Tobacco use cessation is recommended and techniques and options to help you quit were discussed. stop 07/06/10 Prescriptions: LOSARTAN POTASSIUM-HCTZ 100-12.5 MG TABS (LOSARTAN POTASSIUM-HCTZ) Take 1 tablet by mouth once a day  #30 x 6   Entered by:   Teressa Lower RN   Authorized by:   Kathlen Brunswick, MD, [facility]   Signed by:   Teressa Lower RN on 06/24/2010   Method used:   Electronically to        [facility]* (retail)       [address]       [address] Shipping/mailing       [place], [place]  [address]       Ph: [phone]       Fax: [phone]   RxID:   [id number] QUESTRAN 4 GM/DOSE POWD (CHOLESTYRAMINE) take 8 mg two times a day with meals  in water or juice  #60 x 6   Entered by:   Teressa Lower RN   Authorized by:   Kathlen Brunswick, MD, [facility]   Signed by:   Teressa Lower RN on 06/24/2010   Method used:   Electronically to        [facility]* (retail)       [address]       [address] [place] [address]mail[address]       [place], [place]  [address]       Ph: [phone]       Fax: [phone]   RxID:   [id number]

## 2010-12-07 NOTE — Assessment & Plan Note (Signed)
Summary: NP6 CP MUTI RISH FACTORS   Visit Type:  Follow-up Primary Provider:  Syliva Overman MD   History of Present Illness: [name] returns to the office at the request of Dr. Lodema Hong after a four-year hiatus for reassessment of cardiovascular risk factors and evaluation of chest discomfort.  Alicia Neal continues to experience difficulty in improving her lifestyle.  She has not been able to maintain a healthful diet.  She works very long hours.  She continues to smoke cigarettes, but has been abstinent for the past few days.  She did cease tobacco use for more than a decade in the remote past and for 90 days more recently.  She describes localized left upper chest discomfort is unrelated to exertion.  There is no radiation, no associated dyspnea or diaphoresis and no pattern of recurrence.  Episodes were relatively brief and fairly mild, rated as intensity of 3/10.  This discomfort has resolved with no recurrence over the past week or so.    Patient continues to be sedentary, but reports no exercise intolerance.  She has no orthopnea, PND, lightheadedness or syncope.  She has never required hospital admission for pulmonary problems.  She denies chronic cough and sputum production.  Current Medications (verified): 1)  Celexa 40 Mg  Tabs (Citalopram Hydrobromide) .... Take One Tablet By Mouth At Bedtime 2)  Singulair 10 Mg  Tabs (Montelukast Sodium) .... Take One Tablet By Mouth At Bedtime 3)  Lisinopril-Hydrochlorothiazide 20-12.5 Mg Tabs (Lisinopril-Hydrochlorothiazide) .... .take 1 Tablet By Mouth Once A Day 4)  Janumet 50-1000 Mg Tabs (Sitagliptin-Metformin Hcl) .... Take 1 Tablet By Mouth Two Times A Day 5)  Flonase 50 Mcg/act Susp (Fluticasone Propionate) .... One Puff Per Nostril Daily 6)  Welchol 625 Mg Tabs (Colesevelam Hcl) .... Take 1 Three Times A Day X 1 Wk Then 2 Tablets By Mouth Three Times A Day  Allergies (verified): 1)  ! Zocor 2)  ! Vytorin  (Ezetimibe-Simvastatin)  Past History:  PMH, FH, and Social History reviewed and updated.  Past Medical History: Hypertension AODM Tobacco abuse-40 pack years OBESITY: BMI of 35.1 Adenomatous polyps of the colon-10/2009 ALLERGIC RHINITIS, SEASONAL (ICD-477.0) ANXIETY DISORDER (ICD-300.00)/DEPRESSION, CHRONIC  Social History: Married with one adult son Tobacco-40 pack years Alcohol use-no Drug use-no  Review of Systems       See history of present illness.  Vital Signs:  Patient profile:   50 year old female Weight:      188 pounds BMI:     35.07 Pulse rate:   82 / minute BP sitting:   142 / 94  (right arm)  Vitals Entered By: Dreama Saa, CNA (May 06, 2010 11:10 AM)  Physical Exam  General:  Obese; well-developed; no acute distress;  HEENT-[place]/AT; PERRL; EOM intact; conjunctiva and lids nl:  Neck-No JVD; no carotid bruits: Endocrine-No thyromegaly: Lungs-No tachypnea, clear without rales, rhonchi or wheezes: CV-normal PMI; normal S1 and S2; modest systolic ejection murmur Abdomen-BS normal; soft and non-tender without masses or organomegaly: MS-No deformities, cyanosis or clubbing: Neurologic-Nl cranial nerves; symmetric strength and tone: Skin- Warm, no sig. lesions: Extremities-Nl distal pulses; no edema    Impression & Recommendations:  Problem # 1:  CHEST PAIN (ICD-786.50) Patient's chest pain is very atypical and likely does not represent myocardial ischemia.  Due to the fact that she has diabetes and multiple cardiovascular risk factors, a stress test is certainly warranted.  We will proceed with a stress echocardiogram both to exclude coronary artery disease and to  evaluate exercise tolerance.  Problem # 2:  HYPERTENSION (ICD-401.1) Blood pressure control is suboptimal, but better than it has been on most occasions that I seen her in the past.  She will ultimately require adjustment of her antihypertensive medication, but I would like to maintain  clarity and focus in approaching her lifestyle problems , and would not be inclined to address this issue now.  Problem # 3:  TOBACCO ABUSE (ICD-305.1) Patient has repeatedly failed to discontinue cigarette smoking although she appears able to get through initial withdrawal.  This likely is a response to substantial stress and anxiety, which are the main issues that I believe need addressing at the present time  Problem # 4:  OBESITY (ICD-278.00) Patient eventually will require modification of her diet, but again, this is not something that she will be able to do successfully at present.  She may also be a candidate for bariatric surgery, but I would want to see her in a much better psychological state before even suggesting that.  Problem # 5:  DIABETES MELLITUS, TYPE II (ICD-250.00) Recent hemoglobin A1c level is 7.1 indicating near optimal management of at least this one problem.  Ultimately, I would hope that medication could be reduced if she is able to lose weight.  Problem # 6:  ANXIETY DISORDER (ICD-300.00) Alicia Neal has not had the psychological wherewithal to address her many lifestyle issues.  She works an excessive number of hours resulting in little time to prepare healthful meals or to achieve balance between her work life and non-work activities.  She reports that she has mentioned this to her supervisor, but I believe more needs to be done in this area.  [name] has given me permission to contact Alicia Neal.  I will do so and request her help in modifing Alicia Neal schedule.  After considerable effort, I also extracted an agreement to schedule an initial visit with a psychologist.  I would not be at all surprised if she does not keep that appointment, but I will refer her to Dr. Dellia Cloud.  Problem # 7:  HYPERLIPIDEMIA (ICD-272.4) Patient has experienced adverse effects of 2 statins.  We will attempt to achieve control with non-statin medication starting with WelChol one tablet t.i.d. with  meals.  She will increase that to 2 tablets, if the drug is tolerated, and return for a lipid profile in 6 weeks.  I will schedule a return office visit in 7 weeks.  Other Orders: Misc. Referral (Misc. Ref) Stress Echo (Stress Echo)  Patient Instructions: 1)  Your physician recommends that you schedule a follow-up appointment in: 7 weeks 2)  Your physician recommends that you return for lab work in: 6 weeks 3)  Your physician has recommended you make the following change in your medication: welchol 1 three times a day x 1 week then 2 tablets three times a day 4)  You have been referred to Dr. Dellia Cloud 5)  Your physician has requested that you have a stress echocardiogram. For further information please visit [email or web address]  Please follow instruction sheet as given. Prescriptions: WELCHOL 625 MG TABS (COLESEVELAM HCL) take 1 three times a day x 1 wk then 2 tablets by mouth three times a day  #90 x 3   Entered by:   Teressa Lower RN   Authorized by:   Kathlen Brunswick, MD, [facility]   Signed by:   Teressa Lower RN on 05/06/2010   Method used:   Electronically to  [facility]* (retail)       [address]       [address] Shipping/mailing       [place], [place]  [address]       Ph: [phone]       Fax: [phone]   RxID:   [id number]

## 2010-12-07 NOTE — Progress Notes (Signed)
Summary: refills  Phone Note Call from Alicia Neal   Summary of Call: needs to get singular, junmet and celexa call into [facility]. [phone] Initial call taken by: Rudene Anda,  January 25, 2010 10:55 AM    Prescriptions: JANUMET 50-1000 MG TABS (SITAGLIPTIN-METFORMIN HCL) Take 1 tablet by mouth two times a day  #180 x 0   Entered by:   Adella Hare LPN   Authorized by:   Syliva Overman MD   Signed by:   Adella Hare LPN on 45/40/9811   Method used:   Electronically to        [facility]* (retail)       [address]       [address] Shipping/mailing       [place], [place]  [address]       Ph: [phone]       Fax: [phone]   RxID:   [id number] SINGULAIR 10 MG  TABS (MONTELUKAST SODIUM) Take one tablet by mouth at bedtime  #90 x 0   Entered by:   Adella Hare LPN   Authorized by:   Syliva Overman MD   Signed by:   Adella Hare LPN on 66/44/0347   Method used:   Electronically to        [facility]* (retail)       [address]       [address] Shipping/mail[address]       [place], [place]  [address]       Ph: [phone]       Fax: [phone]   RxID:   [id number] CELEXA 40 MG  TABS (CITALOPRAM HYDROBROMIDE) Take one tablet by mouth at bedtime  #90 x 0   Entered by:   Adella Hare LPN   Authorized by:   Syliva Overman MD   Signed by:   Adella Hare LPN on 22/12/5425   Method used:   Electronically to        [facility]* (retail)       [address]       [address] Shipping/mailing       [place], [place]  [address]       Ph: [phone]       Fax: [phone]   RxID:   [id number]

## 2010-12-07 NOTE — Letter (Signed)
Summary: MED REVIEW SIGNES PAPER  MED REVIEW SIGNES PAPER   Imported By: Lind Guest 08/20/2010 09:09:21  _____________________________________________________________________  External Attachment:    Type:   Image     Comment:   External Document

## 2010-12-07 NOTE — Assessment & Plan Note (Signed)
Summary: OFFICE VISIT   Vital Signs:  Patient profile:   50 year old female Height:      61.5 inches Weight:      191.50 pounds BMI:     35.73 O2 Sat:      98 % on Room air Pulse rate:   87 / minute Pulse rhythm:   regular Resp:     16 per minute BP sitting:   142 / 90  (left arm)  Vitals Entered By: Johny Drilling  Nutrition Counseling: Patient's BMI is greater than 25 and therefore counseled on weight management options.  O2 Flow:  Room air CC: Blisters on forehead Comments did not bring meds   Primary Care Provider:  Syliva Overman MD  CC:  Blisters on forehead.  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 This past weekend she had a painful ulcer std the thursday resolved with additional valtrex dosing.  redness in lower gum with bleeding for the first  time about 2 weeks ago, first noted after valtrex.  Concerned about mRSA.  experienced chills the first nioght she took valtrex, nothing on rept dosing  Allergies (verified): 1)  ! Zocor 2)  ! Vytorin (Ezetimibe-Simvastatin)  Review of Systems      See HPI General:  Complains of fatigue and malaise. Eyes:  Denies blurring, discharge, eye pain, and red eye. ENT:  Denies earache and sinus pressure. CV:  Denies chest pain or discomfort and palpitations. Resp:  Denies cough and sputum productive. GI:  Denies abdominal pain, constipation, diarrhea, nausea, and vomiting. GU:  Denies dysuria and urinary frequency. MS:  Denies joint pain and stiffness. Endo:  Denies excessive thirst and excessive urination; testing more regularly and following a  healthier eating plan, aqlso inc activity.  Physical Exam  General:  Well-developed,well-nourished,in no acute distress; alert,appropriate and cooperative throughout examination HEENT: No facial asymmetry,  EOMI, No sinus tenderness, TM's Clear, oropharynx  pink and moist.   Chest: Clear to auscultation bilaterally.  CVS: S1, S2, No murmurs, No S3.   Abd: Soft, Nontender.  MS: Adequate ROM spine, hips, shoulders and knees.  Ext: No edema.   CNS: CN 2-12 intact, power tone and sensation normal throughout.   Skin:erythematous macularrash on left side of forehead with crusted vesicles, more extensive than at previous visit, also pt has 2 erythematous rashes on upper  ext . where she recently desribes as appearing like "pimples"  Psych: Good eye contact, normal affect.  Memory intact, not anxious or depressed appearing.    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  Problem # 2:  HYPERTENSION (ICD-401.1) Assessment: Comment Only  Her updated medication list for this problem  includes:    Losartan Potassium-hctz 100-12.5 Mg Tabs (Losartan potassium-hctz) .Marland Kitchen... Take 1 tablet by mouth once a day  BP today: 142/90 Prior BP: 130/90 (07/30/2010)  Labs Reviewed: K+: 4.1 (07/30/2010) Creat: : 0.68 (07/30/2010)   Chol: 266 (07/30/2010)   HDL: 51 (07/30/2010)   LDL: 167 (07/30/2010)   TG: 238 (07/30/2010)  Problem # 3:  HYPERLIPIDEMIA (ICD-272.4) Assessment: Comment Only  Her updated medication list for this problem includes:    Questran 4 Gm/dose Powd (Cholestyramine) .Marland Kitchen... Take 8 mg two times a day with meals   in water or juice Low fat dietdiscussed and encouraged  Labs Reviewed: SGOT: 60 (07/30/2010)   SGPT: 78 (07/30/2010)   HDL:51 (07/30/2010), 44 (04/30/2010)  LDL:167 (07/30/2010), 161 (04/30/2010)  Chol:266 (07/30/2010), 248 (04/30/2010)  Trig:238 (07/30/2010), 217 (04/30/2010)  Problem # 4:  DIABETES MELLITUS, TYPE II (ICD-250.00) Assessment: Comment Only  Her updated medication list for this problem includes:    Janumet 50-1000 Mg Tabs (Sitagliptin-metformin hcl) .Marland Kitchen... Take 1 tablet by mouth two times a day    Losartan Potassium-hctz 100-12.5 Mg Tabs (Losartan potassium-hctz) .Marland Kitchen... Take 1 tablet by mouth once a day  Labs Reviewed: Creat: 0.68 (07/30/2010)    Reviewed HgBA1c results: 7.9 (07/30/2010)  7.1 (04/30/2010)  Complete Medication List: 1)  Celexa 40 Mg Tabs (Citalopram hydrobromide) .... Take one tablet by mouth at bedtime 2)  Singulair 10 Mg Tabs (Montelukast sodium) .... Take one tablet by mouth at bedtime 3)  Janumet 50-1000 Mg Tabs (Sitagliptin-metformin hcl) .... Take 1 tablet by mouth two times a day 4)  Flonase 50 Mcg/act Susp (Fluticasone propionate) .... One puff per nostril daily 5)  Questran 4 Gm/dose Powd (Cholestyramine) .... Take 8 mg two times a day with meals  in water or juice 6)  Losartan Potassium-hctz 100-12.5 Mg Tabs (Losartan potassium-hctz) .... Take 1 tablet by mouth once a day  Patient Instructions: 1)  F/U as before 2)  Valtrex 500mg  one three times daily for 1 week.if the rash does not totally go away or worsensd, pLS do call to be seen by Dr Margo Aye as we discussed today. 3)  Use altabax  ointment if there appears to be pus in the area

## 2010-12-07 NOTE — Assessment & Plan Note (Signed)
Summary: PER DR   Vital Signs:  Patient profile:   50 year old female Height:      61.5 inches Weight:      190 pounds BMI:     35.45 O2 Sat:      96 % Pulse rate:   73 / minute Pulse rhythm:   regular Resp:     16 per minute BP sitting:   150 / 98  (left arm) Cuff size:   large  Vitals Entered By: Everitt Amber LPN (April 30, 2010 8:00 AM)  Nutrition Counseling: Patient's BMI is greater than 25 and therefore counseled on weight management options. CC: Follow up chronic problems   Primary Care Provider:  Syliva Overman MD  CC:  Follow up chronic problems.  History of Present Illness: Pt states she still is not caring herself. Eating is unheralthy and uncontrolled. Smoking is worse. Work demandscontinue to increase. She is testing her blood sugars and state that fastings are around 140 to 160. she has recently developed left chest pain radiating to back with increased fatigue. Her cardiovascular risk factors are worsening. She is not exercising regularly and she has gained weight.  Preventive Screening-Counseling & Management  Alcohol-Tobacco     Smoking Cessation Counseling: yes  Current Medications (verified): 1)  Celexa 40 Mg  Tabs (Citalopram Hydrobromide) .... Take One Tablet By Mouth At Bedtime 2)  Singulair 10 Mg  Tabs (Montelukast Sodium) .... Take One Tablet By Mouth At Bedtime 3)  Lisinopril-Hydrochlorothiazide 20-12.5 Mg Tabs (Lisinopril-Hydrochlorothiazide) .... .take 1 Tablet By Mouth Once A Day 4)  Janumet 50-1000 Mg Tabs (Sitagliptin-Metformin Hcl) .... Take 1 Tablet By Mouth Two Times A Day  Allergies (verified): 1)  ! Zocor 2)  ! Vytorin (Ezetimibe-Simvastatin)  Review of Systems      See HPI General:  Complains of fatigue and sleep disorder. Eyes:  Denies blurring and discharge. ENT:  Complains of nasal congestion and postnasal drainage; denies hoarseness, sinus pressure, and sore throat. CV:  Complains of chest pain or discomfort, fatigue, and  shortness of breath with exertion; denies difficulty breathing while lying down; 3 week h/o left chest discomfort, fatigue radiates to back smoking  aggravates it multiple risk factors pt did not take BP med today and has been under increased stress on the job with littlesleep  last night. Resp:  Complains of cough and shortness of breath; denies sputum productive. GI:  Denies abdominal pain, constipation, diarrhea, nausea, and vomiting. GU:  Denies dysuria and urinary frequency. MS:  Denies joint pain and stiffness. Derm:  Denies itching and rash. Neuro:  Complains of headaches; denies seizures and sensation of room spinning; sinus pressurand fullness. Psych:  alot of stress. Endo:  fasting blood sugars around 140. Heme:  Denies abnormal bruising and bleeding. Allergy:  Complains of seasonal allergies; uncontrolled allergies excessive sneezing.  Physical Exam  General:  Well-developed,obese,in no acute distress; alert,appropriate and cooperative throughout examination HEENT: No facial asymmetry,  EOMI, No sinus tenderness, TM's Clear, oropharynx  pink and moist.   Chest: decreased air entry, no crackles or wheezes CVS: S1, S2, No murmurs, No S3.   Abd: Soft, Nontender.  MS: Adequate ROM spine, hips, shoulders and knees.  Ext: No edema.   CNS: CN 2-12 intact, power tone and sensation normal throughout.   Skin: Intact, no visible lesions or rashes.  Psych: Good eye contact, normal affect.  Memory intact, not anxious or depressed appearing.    Impression & Recommendations:  Problem # 1:  CHEST  PAIN (ICD-786.50) Assessment Deteriorated  Orders: EKG w/ Interpretation (93000) Cardiology Referral (Cardiology)  Problem # 2:  OBESITY, UNSPECIFIED (ICD-278.00) Assessment: Deteriorated  Ht: 61.5 (04/30/2010)   Wt: 190 (04/30/2010)   BMI: 35.45 (04/30/2010)  Problem # 3:  ESSENTIAL HYPERTENSION, BENIGN (ICD-401.1) Assessment: Unchanged  The following medications were removed from  the medication list:    Lisinopril-hydrochlorothiazide 20-12.5 Mg Tabs (Lisinopril-hydrochlorothiazide) ..... One tab by mouth qd Her updated medication list for this problem includes:    Lisinopril-hydrochlorothiazide 20-12.5 Mg Tabs (Lisinopril-hydrochlorothiazide) ..... Marland Kitchentake 1 tablet by mouth once a day  Orders: T-Basic Metabolic Panel [id number])  BP today: 150/98 Prior BP: 170/100 (10/16/2009)  Labs Reviewed: K+: 4.3 (10/13/2009) Creat: : 0.69 (10/13/2009)   Chol: 236 (10/13/2009)   HDL: 48 (10/13/2009)   LDL: 150 (10/13/2009)   TG: 191 (10/13/2009)  Problem # 4:  NICOTINE ADDICTION (ICD-305.1) Assessment: Unchanged  Encouraged smoking cessation and discussed different methods for smoking cessation.   Problem # 5:  Preventive Health Care (ICD-V70.0)  Complete Medication List: 1)  Celexa 40 Mg Tabs (Citalopram hydrobromide) .... Take one tablet by mouth at bedtime 2)  Singulair 10 Mg Tabs (Montelukast sodium) .... Take one tablet by mouth at bedtime 3)  Lisinopril-hydrochlorothiazide 20-12.5 Mg Tabs (Lisinopril-hydrochlorothiazide) .... .take 1 tablet by mouth once a day 4)  Janumet 50-1000 Mg Tabs (Sitagliptin-metformin hcl) .... Take 1 tablet by mouth two times a day 5)  Flonase 50 Mcg/act Susp (Fluticasone propionate) .... One puff per nostril daily  Other Orders: T-Hepatic Function [id number]) T-Lipid Profile [id number]) T- Hemoglobin A1C ([id number])  Patient Instructions: 1)  Please schedule a follow-up appointment in 3 months. 2)  Tobacco is very bad for your health and your loved ones! You Should stop smoking!. 3)  Stop Smoking Tips: Choose a Quit date. Cut down before the Quit date. decide what you will do as a substitute when you feel the urge to smoke(gum,toothpick,exercise). 4)  It is important that you exercise regularly at least 20 minutes 5 times a week. If you develop chest pain, have severe difficulty breathing, or feel very tired , stop exercising  immediately and seek medical attention. 5)  You need to lose weight. Consider a lower calorie diet and regular exercise.  6)  BMP prior to visit, ICD-9: 7)  Hepatic Panel prior to visit, ICD-9: 8)  Lipid Panel prior to visit, ICD-9:    today 9)  HbgA1C prior to visit, ICD-9: 10)  Pls enroll with medlink, next session is July 26 11)  you are being referred to cardiology Prescriptions: FLONASE 50 MCG/ACT SUSP (FLUTICASONE PROPIONATE) one puff per nostril daily  #3 x 3   Entered and Authorized by:   Syliva Overman MD   Signed by:   Syliva Overman MD on 04/30/2010   Method used:   Electronically to        [facility]* (retail)       [address]       [address] Shipping/mailing       [place], [place]  [address]       Ph: [phone]       Fax: [phone]   RxID:   [id number] JANUMET 50-1000 MG TABS (SITAGLIPTIN-METFORMIN HCL) Take 1 tablet by mouth two times a day  #180 x 2   Entered by:   Everitt Amber LPN   Authorized by:   Syliva Overman MD   Signed by:   Everitt Amber LPN on 26/94/8546   Method  used:   Electronically to        [facility]* (retail)       [address]       [address] Shipping/mailing       [place], [place]  [address]       Ph: [phone]       Fax: [phone]   RxID:   [id number] LISINOPRIL-HYDROCHLOROTHIAZIDE 20-12.5 MG TABS (LISINOPRIL-HYDROCHLOROTHIAZIDE) .Take 1 tablet by mouth once a day  #90 x 2   Entered by:   Everitt Amber LPN   Authorized by:   Syliva Overman MD   Signed by:   Everitt Amber LPN on 27/25/3664   Method used:   Electronically to        [facility]* (retail)       [address]       [address] Shipping/mailing       [place], [place]  [address]       Ph: [phone]       Fax: [phone]   RxID:   [id number] SINGULAIR 10 MG  TABS (MONTELUKAST SODIUM) Take one tablet by mouth at bedtime  #90 x 2   Entered by:   Everitt Amber LPN   Authorized by:    Syliva Overman MD   Signed by:   Everitt Amber LPN on 35/57/3220   Method used:   Electronically to        [facility]* (retail)       [address]       [address] Shipping/mailing       [place], [place]  [address]       Ph: [phone]       Fax: [phone]   RxID:   [id number] CELEXA 40 MG  TABS (CITALOPRAM HYDROBROMIDE) Take one tablet by mouth at bedtime  #90 x 2   Entered by:   Everitt Amber LPN   Authorized by:   Syliva Overman MD   Signed by:   Everitt Amber LPN on 00/93/8182   Method used:   Electronically to        [facility]* (retail)       [address]       [address] Shipping/mailing       [place], [place]  [address]       Ph: [phone]       Fax: [phone]   RxID:   [id number]

## 2010-12-07 NOTE — Progress Notes (Signed)
Summary: RETURNING YOUR CALL  Phone Note Call from Patient   Summary of Call: CALL BACK AT [phone] Initial call taken by: Lind Guest,  May 06, 2010 10:48 AM  Follow-up for Phone Call        Already have an append opened for this patient. See previous note Follow-up by: Everitt Amber LPN,  May 06, 2010 11:40 AM

## 2010-12-07 NOTE — Letter (Signed)
Summary: LAB ADD ON   LAB ADD ON   Imported By: Lind Guest 08/10/2010 13:34:37  _____________________________________________________________________  External Attachment:    Type:   Image     Comment:   External Document

## 2010-12-08 ENCOUNTER — Encounter: Payer: Self-pay | Admitting: Family Medicine

## 2010-12-09 NOTE — Progress Notes (Signed)
Summary: MEDCIATION  Phone Note Call from Patient   Summary of Call: PATINET NEED REILL ON SINGULAR AND CELEXIA WILL BE OUT BEFOR OV  Initial call taken by: Eugenio Hoes,  November 02, 2010 9:02 AM    Prescriptions: SINGULAIR 10 MG  TABS (MONTELUKAST SODIUM) Take one tablet by mouth at bedtime  #90 x 0   Entered by:   Adella Hare LPN   Authorized by:   Syliva Overman MD   Signed by:   Adella Hare LPN on 78/29/5621   Method used:   Electronically to        [facility]* (retail)       [address]       [address] Shipping/mailing       [place], [place]  [address]       Ph: [phone]       Fax: [phone]   RxID:   [id number] CELEXA 40 MG  TABS (CITALOPRAM HYDROBROMIDE) Take one tablet by mouth at bedtime  #90 x 0   Entered by:   Adella Hare LPN   Authorized by:   Syliva Overman MD   Signed by:   Adella Hare LPN on 95/63/8756   Method used:   Electronically to        [facility]* (retail)       [address]       [address] Shipping/mailing       [place], [place]  [address]       Ph: [phone]       Fax: [phone]   RxID:   [id number]

## 2010-12-09 NOTE — Letter (Signed)
Summary: lab add on  lab add on   Imported By: Luann Bullins 11/09/2010 15:20:38  _____________________________________________________________________  External Attachment:    Type:   Image     Comment:   External Document

## 2010-12-09 NOTE — Letter (Signed)
Summary: DERMATOLOGY  DERMATOLOGY   Imported By: Lind Guest 11/29/2010 14:52:14  _____________________________________________________________________  External Attachment:    Type:   Image     Comment:   External Document
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
## 2010-12-15 NOTE — Letter (Signed)
Summary: DERMATOLOGY  DERMATOLOGY   Imported By: Lind Guest 12/06/2010 16:38:38  _____________________________________________________________________  External Attachment:    Type:   Image     Comment:   External Document

## 2010-12-23 NOTE — Letter (Signed)
Summary: eye exam  eye exam   Imported By: Lind Guest 12/13/2010 13:59:04  _____________________________________________________________________  External Attachment:    Type:   Image     Comment:   External Document

## 2010-12-29 NOTE — Letter (Signed)
Summary: DERMATOLOGY  DERMATOLOGY   Imported By: Lind Guest 12/22/2010 10:32:23  _____________________________________________________________________  External Attachment:    Type:   Image     Comment:   External Document

## 2011-01-07 ENCOUNTER — Encounter: Payer: Self-pay | Admitting: Family Medicine

## 2011-01-26 ENCOUNTER — Encounter: Payer: Self-pay | Admitting: Family Medicine

## 2011-01-26 ENCOUNTER — Other Ambulatory Visit: Payer: Self-pay | Admitting: *Deleted

## 2011-01-26 ENCOUNTER — Telehealth: Payer: Self-pay | Admitting: Family Medicine

## 2011-01-26 ENCOUNTER — Other Ambulatory Visit: Payer: Self-pay | Admitting: Family Medicine

## 2011-01-26 MED ORDER — CITALOPRAM HYDROBROMIDE 40 MG PO TABS: 40.0000 mg | ORAL_TABLET | Freq: Every day | ORAL | Status: DC

## 2011-01-27 NOTE — Telephone Encounter (Signed)
Has been done by [facility]

## 2011-02-10 ENCOUNTER — Other Ambulatory Visit: Payer: Self-pay | Admitting: Family Medicine

## 2011-02-24 ENCOUNTER — Ambulatory Visit (HOSPITAL_COMMUNITY)
Admission: RE | Admit: 2011-02-24 | Discharge: 2011-02-24 | Disposition: A | Discharge status: Home / Self Care | Payer: 59 | Source: Ambulatory Visit | Attending: Family Medicine | Admitting: Family Medicine

## 2011-02-24 ENCOUNTER — Telehealth: Payer: Self-pay | Admitting: Family Medicine

## 2011-02-24 ENCOUNTER — Other Ambulatory Visit: Payer: Self-pay

## 2011-02-24 DIAGNOSIS — M549 Dorsalgia, unspecified: Secondary | ICD-10-CM

## 2011-02-24 DIAGNOSIS — IMO0002 Reserved for concepts with insufficient information to code with codable children: Secondary | ICD-10-CM | POA: Insufficient documentation

## 2011-02-24 DIAGNOSIS — M533 Sacrococcygeal disorders, not elsewhere classified: Secondary | ICD-10-CM | POA: Insufficient documentation

## 2011-02-24 DIAGNOSIS — R296 Repeated falls: Secondary | ICD-10-CM | POA: Insufficient documentation

## 2011-02-24 NOTE — Telephone Encounter (Signed)
Ordered and called patient left message

## 2011-02-24 NOTE — Telephone Encounter (Signed)
Patient aware.

## 2011-02-24 NOTE — Telephone Encounter (Signed)
pls order xray of her lumbar spine and coccyx , dx evaluate pain recent h/o fall let her know

## 2011-02-24 NOTE — Telephone Encounter (Signed)
Can she get xray?

## 2011-02-25 ENCOUNTER — Telehealth: Payer: Self-pay | Admitting: Family Medicine

## 2011-02-25 ENCOUNTER — Encounter: Payer: Self-pay | Admitting: Family Medicine

## 2011-02-25 NOTE — Telephone Encounter (Signed)
Patient aware arthritis and spur in low back but no fracture

## 2011-02-28 ENCOUNTER — Encounter: Payer: Self-pay | Admitting: Family Medicine

## 2011-03-01 ENCOUNTER — Ambulatory Visit (INDEPENDENT_AMBULATORY_CARE_PROVIDER_SITE_OTHER): Payer: 59 | Admitting: Family Medicine

## 2011-03-01 ENCOUNTER — Encounter: Payer: Self-pay | Admitting: Family Medicine

## 2011-03-01 VITALS — BP 150/100 | HR 78 | Resp 18 | Ht 64.0 in | Wt 188.0 lb

## 2011-03-01 DIAGNOSIS — E785 Hyperlipidemia, unspecified: Secondary | ICD-10-CM

## 2011-03-01 DIAGNOSIS — K219 Gastro-esophageal reflux disease without esophagitis: Secondary | ICD-10-CM

## 2011-03-01 DIAGNOSIS — E119 Type 2 diabetes mellitus without complications: Secondary | ICD-10-CM

## 2011-03-01 DIAGNOSIS — I10 Essential (primary) hypertension: Secondary | ICD-10-CM

## 2011-03-01 MED ORDER — MONTELUKAST SODIUM 10 MG PO TABS: 10.0000 mg | ORAL_TABLET | Freq: Every day | ORAL | Status: DC

## 2011-03-01 MED ORDER — CITALOPRAM HYDROBROMIDE 40 MG PO TABS: 40.0000 mg | ORAL_TABLET | Freq: Every day | ORAL | Status: DC

## 2011-03-01 MED ORDER — SITAGLIPTIN PHOS-METFORMIN HCL 50-1000 MG PO TABS: 1.0000 | ORAL_TABLET | Freq: Two times a day (BID) | ORAL | Status: DC

## 2011-03-01 MED ORDER — ESOMEPRAZOLE MAGNESIUM 20 MG PO CPDR: 20.0000 mg | DELAYED_RELEASE_CAPSULE | Freq: Every day | ORAL | Status: DC

## 2011-03-01 MED ORDER — LOSARTAN POTASSIUM 100 MG PO TABS: 100.0000 mg | ORAL_TABLET | Freq: Every day | ORAL | Status: DC

## 2011-03-01 NOTE — Patient Instructions (Signed)
F/u in 3.5 months.  BP is high 150/100, pls start your med if you have probs let me know.  Pls sched your mammogram and an eye exam at [facility].  It is important that you exercise regularly at least 30 minutes 5 times a week. If you develop chest pain, have severe difficulty breathing, or feel very tired, stop exercising immediately and seek medical attention  i am happy that you have started walking. Continue to change your eating habits re food choices for better health.  Fasting labs are due , also p,ls try and get your next set before the next visit  Pls continue tom cut down ciggaretes with a view to quitting by next year

## 2011-03-01 NOTE — Progress Notes (Signed)
  Subjective:    Patient ID: Alicia Neal, female    DOB: 1961-07-23, 50 y.o.   MRN: [id number]  HPI The PT is here for follow up and re-evaluation of chronic medical conditions, medication management and review of recent lab and radiology data.  Preventive health is updated, specifically  Cancer screening, and Immunization.   Questions or concerns regarding consultations or procedures which the PT has had in the interim are  Addressed. 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 Unfortunately she has needed prednisone. She reports blood sugars to average about 160 when checked. She is less mentally stressed with poor self image, but still has not been able to live independently for financial reasons. Nicotine is approx 10/day. Walks about daily with her dogs for the past 3 weeks The PT is non compliant with BP med, it causes her to wet herself.  There are no new concerns.  There are no specific complaints       Review of Systems Denies recent fever or chills. Denies sinus pressure, nasal congestion, ear pain or sore throat. Denies chest congestion, productive cough or wheezing. Denies chest pains, palpitations, paroxysmal nocturnal dyspnea, orthopnea and leg swelling Denies abdominal pain, nausea, vomiting,diarrhea or constipation.  Denies rectal bleeding or change in bowel movement. Takes med for reflux and uses an excessive amt of caffeine Denies dysuria, frequency, hesitancy or incontinence. Denies joint pain, swelling and limitation in mobility. Denies headaches, seizure, numbness, or tingling. Denies depression, anxiety or insomnia. Denies skin break , rash on forehead improved       Objective:   Physical Exam Patient alert and oriented and in no Cardiopulmonary distress.  HEENT: No facial asymmetry, EOMI, no sinus tenderness, TM's clear, Oropharynx pink and moist.  Neck supple no  adenopathy.  Chest: Clear to auscultation bilaterally.Decreased air entry bilaterally  CVS: S1, S2 no murmurs, no S3.  ABD: Soft non tender. Bowel sounds normal.  Ext: No edema  MS: Adequate ROM spine, shoulders, hips and knees.  Skin: Intact, no ulcerations or rash noted.  Psych: Good eye contact, normal affect. Memory intact not anxious or depressed appearing.  CNS: CN 2-12 intact, power, tone and sensation normal throughout.        Assessment & Plan:  1. Hypertension:Uncotrolled, medication compliance addressed. Changes in medication made as needed and the importance of commitment to lifestyle changes to improve blood pressure discussed and encouraged. 2. DM: HBa1C to be checked. 3. Hyperlipidemia: fasting lipids to be checked 4.Nicotine use ; unchanged, cessation counseling done 5. Obesity: unchanged, lifestyle counseling done 6. Pemphigoid : improved

## 2011-03-02 ENCOUNTER — Encounter: Payer: Self-pay | Admitting: Family Medicine

## 2011-03-02 NOTE — Progress Notes (Signed)
Patient was in for OV monday

## 2011-03-25 NOTE — Procedures (Signed)
Alicia Neal, Alicia Neal                        ACCOUNT NO.:  [id number]   MEDICAL RECORD NO.:  [id number]                   PATIENT TYPE:  PREC   LOCATION:                                       FACILITY:   PHYSICIAN:  Kem Boroughs, M.D.                 DATE OF BIRTH:   DATE OF PROCEDURE:  04/30/2003  DATE OF DISCHARGE:                                  ECHOCARDIOGRAM   PROCEDURE:  Echocardiogram   SURGEON:  Sherral Hammers, M.D.   INDICATIONS:  This patient is a 50 year old female who has a quite strong  family history of heart disease and has been experiencing chest pain with  some shortness of breath.   The technical quality of this study is adequate.   FINDINGS:  1. The aorta is within normal limits at 3.1 cm.  2. The left atrium is also within normal limits at 3.7 cm.  No obvious clots     or masses were appreciated.  The patient appeared to be in sinus rhythm     during this procedure.  She did, however, exhibit significant heart rate     variability during the procedure.  3. The interventricular septum and posterior wall were within normal limits     in thickness.  4. The aortic valve appeared reasonably structurally normal with good     leaflet excursion.  No aortic insufficiency was noted.  Doppler     interrogation of the aortic valve was within normal limits.  5. The mitral valve was also structurally normal with good leaflet     excursion.  No mitral valve prolapse was noted.  Trivial mitral     regurgitation was noted.  Doppler interrogation of the mitral valve was     within normal limits.  6. The pulmonic valve was not well visualized.  7. The tricuspid valve appeared grossly structurally normal with mild     tricuspid regurgitation noted.  8. The left ventricle was normal in size with the LVIDD measuring 4.6 cm and     the LVISD measured at 3.0 cm.  Overall ejection fraction appeared to be     at the lower limits of normal (approximately 50%).  In one view  only     (apical 2-chamber) the inferior and posterior wall appeared mildly     hypokinetic as compared to the remaining walls. This was not confirmed in     multiple views.  Interior and posterior wall motion looks normal in the     remaining views.  The right atrium was normal in size,while the right     ventricle appeared mildly generous with normal right ventricular systolic     function.   IMPRESSION:  1. Normal left ventricular chamber size.  2. Overall ejection fraction at the lower limits of normal (approximately     50%).  3. In one view only (the apical 2-chamber  view), the inferior and posterior     wall appeared mildly hypokinetic; this was not appreciated in any other     view.  4. No mitral valve prolapse.  5.     Normal valvular function.  6. Trivial mitral and mild tricuspid regurgitation.  7. The right ventricle appeared mildly generous but with normal right     ventricular systolic function.                                               Kem Boroughs, M.D.    TB/MEDQ  D:  04/30/2003  T:  04/30/2003  Job:  [id number]   cc:   Milus Mallick. Lodema Hong, M.D.  [address]  [place], [place] [address]  Fax: [phone]

## 2011-03-25 NOTE — Procedures (Signed)
   NAME:  Alicia Neal, Alicia Neal                       ACCOUNT NO.:  [id number]   MEDICAL RECORD NO.:  [id number]                   PATIENT TYPE:  PREC   LOCATION:                                       FACILITY:   PHYSICIAN:  Kem Boroughs, M.D.                 DATE OF BIRTH:   DATE OF PROCEDURE:  04/30/2003  DATE OF DISCHARGE:                                    STRESS TEST   INDICATIONS FOR PROCEDURE:  Alicia Neal is a 50 year old female with known  diabetes mellitus and hypertension who has had chest pain.  She was referred  for a nuclear stress test to evaluate this chest pain, particularly given  her diabetic status.   PROCEDURE:  The baseline 12-lead electrocardiogram revealed normal sinus  rhythm.  Her baseline blood pressure was 126/84, with a resting pulse of 55  beats per minute.  She walked for 6 minutes and 44 seconds on the full Bruce  protocol.  Her peak heart rate was 156 beats per minute.  Her peak blood  pressure was 152/96 mmHg.  She experienced no chest discomfort during the  procedure.  She stopped secondary to fatigue and shortness of breath.  She  experienced no symptoms of claudication.  Her EKG peak heart rate revealed  sinus tachycardia with minimal (less than 0.5 mm) upsloping ST depression  inferiorly.  She recovered in less than five minutes.   EXERCISE STRESS TEST IMPRESSION:  Negative, adequate Bruce protocol exercise  stress test.   IMPRESSION:  1. Poor to fair overall exercise tolerance.  2. Appropriate blood pressure and heart rate response.  3. Negative, adequate Bruce protocol stress test.  4. Sinographic images are pending.                                               Kem Boroughs, M.D.    TB/MEDQ  D:  04/30/2003  T:  04/30/2003  Job:  [id number]   cc:   Milus Mallick. Lodema Hong, M.D.  [address]  [place], [place] [address]  Fax: [phone]

## 2011-08-17 ENCOUNTER — Other Ambulatory Visit: Payer: Self-pay | Admitting: Family Medicine

## 2011-08-17 DIAGNOSIS — Z139 Encounter for screening, unspecified: Secondary | ICD-10-CM

## 2011-08-19 ENCOUNTER — Ambulatory Visit (HOSPITAL_COMMUNITY)
Admission: RE | Admit: 2011-08-19 | Discharge: 2011-08-19 | Disposition: A | Discharge status: Home / Self Care | Payer: 59 | Source: Ambulatory Visit | Attending: Family Medicine | Admitting: Family Medicine

## 2011-08-19 DIAGNOSIS — Z139 Encounter for screening, unspecified: Secondary | ICD-10-CM

## 2011-08-19 DIAGNOSIS — Z1231 Encounter for screening mammogram for malignant neoplasm of breast: Secondary | ICD-10-CM | POA: Insufficient documentation

## 2011-09-15 ENCOUNTER — Ambulatory Visit (INDEPENDENT_AMBULATORY_CARE_PROVIDER_SITE_OTHER): Payer: 59 | Admitting: Otolaryngology

## 2011-09-15 DIAGNOSIS — R1312 Dysphagia, oropharyngeal phase: Secondary | ICD-10-CM

## 2011-09-15 DIAGNOSIS — R070 Pain in throat: Secondary | ICD-10-CM

## 2011-10-06 ENCOUNTER — Ambulatory Visit (INDEPENDENT_AMBULATORY_CARE_PROVIDER_SITE_OTHER): Payer: 59 | Admitting: Otolaryngology

## 2011-12-12 ENCOUNTER — Telehealth: Payer: Self-pay

## 2011-12-12 DIAGNOSIS — M899 Disorder of bone, unspecified: Secondary | ICD-10-CM

## 2011-12-12 DIAGNOSIS — M949 Disorder of cartilage, unspecified: Secondary | ICD-10-CM

## 2011-12-12 DIAGNOSIS — E785 Hyperlipidemia, unspecified: Secondary | ICD-10-CM

## 2011-12-12 DIAGNOSIS — E669 Obesity, unspecified: Secondary | ICD-10-CM

## 2011-12-12 DIAGNOSIS — E119 Type 2 diabetes mellitus without complications: Secondary | ICD-10-CM

## 2011-12-12 DIAGNOSIS — R5381 Other malaise: Secondary | ICD-10-CM

## 2011-12-12 NOTE — Telephone Encounter (Signed)
Ned to collect lab orders that are past due. Pt to collect

## 2012-01-12 ENCOUNTER — Encounter: Payer: Self-pay | Admitting: Family Medicine

## 2012-01-12 ENCOUNTER — Ambulatory Visit (INDEPENDENT_AMBULATORY_CARE_PROVIDER_SITE_OTHER): Payer: 59 | Admitting: Family Medicine

## 2012-01-12 VITALS — BP 130/90 | HR 83 | Resp 16 | Ht 64.0 in | Wt 187.0 lb

## 2012-01-12 DIAGNOSIS — E785 Hyperlipidemia, unspecified: Secondary | ICD-10-CM

## 2012-01-12 DIAGNOSIS — I10 Essential (primary) hypertension: Secondary | ICD-10-CM | POA: Insufficient documentation

## 2012-01-12 DIAGNOSIS — F3289 Other specified depressive episodes: Secondary | ICD-10-CM

## 2012-01-12 DIAGNOSIS — F32A Depression, unspecified: Secondary | ICD-10-CM

## 2012-01-12 DIAGNOSIS — E119 Type 2 diabetes mellitus without complications: Secondary | ICD-10-CM

## 2012-01-12 DIAGNOSIS — F17200 Nicotine dependence, unspecified, uncomplicated: Secondary | ICD-10-CM

## 2012-01-12 DIAGNOSIS — E559 Vitamin D deficiency, unspecified: Secondary | ICD-10-CM | POA: Insufficient documentation

## 2012-01-12 DIAGNOSIS — F329 Major depressive disorder, single episode, unspecified: Secondary | ICD-10-CM

## 2012-01-12 DIAGNOSIS — J301 Allergic rhinitis due to pollen: Secondary | ICD-10-CM

## 2012-01-12 LAB — CBC WITH DIFFERENTIAL/PLATELET
Basophils Absolute: 0.1 10*3/uL (ref 0.0–0.1)
Basophils Relative: 1 % (ref 0–1)
Hemoglobin: 13.3 g/dL (ref 12.0–15.0)
MCHC: 31.4 g/dL (ref 30.0–36.0)
Monocytes Relative: 6 % (ref 3–12)
Neutro Abs: 6.5 10*3/uL (ref 1.7–7.7)
Neutrophils Relative %: 71 % (ref 43–77)
Platelets: 392 10*3/uL (ref 150–400)

## 2012-01-12 LAB — COMPLETE METABOLIC PANEL WITH GFR
Albumin: 4.7 g/dL (ref 3.5–5.2)
BUN: 11 mg/dL (ref 6–23)
CO2: 21 mEq/L (ref 19–32)
Calcium: 9.7 mg/dL (ref 8.4–10.5)
Chloride: 100 mEq/L (ref 96–112)
Creat: 0.59 mg/dL (ref 0.50–1.10)
GFR, Est African American: >89 mL/min
Glucose, Bld: 152 mg/dL — ABNORMAL HIGH (ref 70–99)
Potassium: 4.2 mEq/L (ref 3.5–5.3)

## 2012-01-12 LAB — LIPID PANEL
Cholesterol: 267 mg/dL — ABNORMAL HIGH (ref 0–200)
Triglycerides: 179 mg/dL — ABNORMAL HIGH (ref <150)
VLDL: 36 mg/dL (ref 0–40)

## 2012-01-12 LAB — HEMOGLOBIN A1C: Mean Plasma Glucose: 183 mg/dL — ABNORMAL HIGH (ref <117)

## 2012-01-12 LAB — MICROALBUMIN / CREATININE URINE RATIO
Creatinine, Urine: 77.6 mg/dL
Microalb Creat Ratio: 25.1 mg/g (ref 0.0–30.0)
Microalb, Ur: 1.95 mg/dL — ABNORMAL HIGH (ref 0.00–1.89)

## 2012-01-12 MED ORDER — VITAMIN D3 1.25 MG (50000 UT) PO CAPS: 50000.0000 [IU] | ORAL_CAPSULE | ORAL | Status: DC

## 2012-01-12 MED ORDER — LOSARTAN POTASSIUM 25 MG PO TABS: ORAL_TABLET | ORAL | Status: DC

## 2012-01-12 MED ORDER — GLIPIZIDE ER 5 MG PO TB24: 5.0000 mg | ORAL_TABLET | Freq: Every day | ORAL | Status: DC

## 2012-01-12 MED ORDER — PRAVASTATIN SODIUM 40 MG PO TABS: ORAL_TABLET | ORAL | Status: DC

## 2012-01-12 NOTE — Patient Instructions (Addendum)
F/u  In 6 weeks  You will get a direct referral for individual diabetic counseling this week Monday at [facility]  You will be started on glipizide XL 5mg  one at breakfast.  Need to test up to twice, but at least need to test once daily record, bring the record to visit.  Start 30 mins per day of exercise.  New med pravachol , let me know if a problem.  Pls start the cozaar for blood pressure and kidney protection  9 ciggs per day in March,April 8 etc  Start calcium wit D 1200mg /1000IU one daily

## 2012-01-13 ENCOUNTER — Encounter: Payer: Self-pay | Admitting: Family Medicine

## 2012-01-13 DIAGNOSIS — F329 Major depressive disorder, single episode, unspecified: Secondary | ICD-10-CM | POA: Insufficient documentation

## 2012-01-13 DIAGNOSIS — F32A Depression, unspecified: Secondary | ICD-10-CM | POA: Insufficient documentation

## 2012-01-13 MED ORDER — ERGOCALCIFEROL 1.25 MG (50000 UT) PO CAPS: 50000.0000 [IU] | ORAL_CAPSULE | ORAL | Status: DC

## 2012-01-13 NOTE — Assessment & Plan Note (Signed)
Cessation counseling done, no quit date set at this time

## 2012-01-13 NOTE — Assessment & Plan Note (Signed)
Controlled, no change in medication  

## 2012-01-13 NOTE — Assessment & Plan Note (Signed)
Hyperlipidemia:Low fat diet discussed and encouraged.  Has been statin intolerant in the past with muscle cramps, has not been taking cholestyramine, trial of pravastatin

## 2012-01-13 NOTE — Assessment & Plan Note (Signed)
Improved since last checked, but still uncontrolled, start glipizide and refer to med link Pt to test at least once daily and return with diary in the next 6 weeks

## 2012-01-13 NOTE — Assessment & Plan Note (Addendum)
New dx med sent in pt counseled re importance of supplement

## 2012-01-13 NOTE — Progress Notes (Signed)
  Subjective:    Patient ID: Alicia Neal, female    DOB: [birth year], 51 y.o.   MRN: [id number]  HPI The PT is here for follow up and re-evaluation of chronic medical conditions, medication management and review of any available recent lab and radiology data. Has not had labs for over 12 months, and at that time they were abnormal. Smokes about 10 cigarettes daily, unwilling to set a quit date. Not compliant with regard to diet, exercise or blood sugar testing,currently re- challenged to change this Preventive health is updated, specifically  Cancer screening and Immunization.   Questions or concerns regarding consultations or procedures which the PT has had in the interim are  addressed.   There are no specific complaints      Review of Systems See HPI Denies recent fever or chills. Denies sinus pressure, nasal congestion, ear pain or sore throat. Denies chest congestion, productive cough or wheezing. Denies chest pains, palpitations and leg swelling Denies abdominal pain, nausea, vomiting,diarrhea or constipation.   Denies dysuria, frequency, hesitancy or incontinence. Denies joint pain, swelling and limitation in mobility. Denies headaches, seizures, numbness, or tingling. Denies depression, anxiety or insomnia. Denies skin break down or rash.        Objective:   Physical Exam  Patient alert and oriented and in no cardiopulmonary distress.  HEENT: No facial asymmetry, EOMI, no sinus tenderness,  oropharynx pink and moist.  Neck supple no adenopathy.  Chest: Clear to auscultation bilaterally.Decreased air entry throughout  CVS: S1, S2 no murmurs, no S3.  ABD: Soft non tender. Bowel sounds normal.  Ext: No edema  MS: Adequate ROM spine, shoulders, hips and knees.  Skin: Intact, no ulcerations or rash noted.  Psych: Good eye contact, normal affect. Memory intact not anxious or depressed appearing.  CNS: CN 2-12 intact, power, tone and sensation normal  throughout.       Assessment & Plan:

## 2012-02-07 ENCOUNTER — Telehealth: Payer: Self-pay | Admitting: Family Medicine

## 2012-02-08 NOTE — Telephone Encounter (Signed)
Labs faxed

## 2012-02-24 ENCOUNTER — Ambulatory Visit: Payer: 59 | Admitting: Family Medicine

## 2012-03-07 ENCOUNTER — Encounter: Payer: Self-pay | Admitting: Family Medicine

## 2012-03-07 ENCOUNTER — Ambulatory Visit (INDEPENDENT_AMBULATORY_CARE_PROVIDER_SITE_OTHER): Payer: 59 | Admitting: Family Medicine

## 2012-03-07 VITALS — BP 150/90 | HR 76 | Resp 16 | Ht 64.0 in | Wt 186.0 lb

## 2012-03-07 DIAGNOSIS — F411 Generalized anxiety disorder: Secondary | ICD-10-CM

## 2012-03-07 DIAGNOSIS — F3289 Other specified depressive episodes: Secondary | ICD-10-CM

## 2012-03-07 DIAGNOSIS — IMO0001 Reserved for inherently not codable concepts without codable children: Secondary | ICD-10-CM

## 2012-03-07 DIAGNOSIS — J301 Allergic rhinitis due to pollen: Secondary | ICD-10-CM

## 2012-03-07 DIAGNOSIS — I10 Essential (primary) hypertension: Secondary | ICD-10-CM

## 2012-03-07 DIAGNOSIS — L121 Cicatricial pemphigoid: Secondary | ICD-10-CM

## 2012-03-07 DIAGNOSIS — IMO0002 Reserved for concepts with insufficient information to code with codable children: Secondary | ICD-10-CM

## 2012-03-07 DIAGNOSIS — E1165 Type 2 diabetes mellitus with hyperglycemia: Secondary | ICD-10-CM

## 2012-03-07 DIAGNOSIS — F32A Depression, unspecified: Secondary | ICD-10-CM

## 2012-03-07 DIAGNOSIS — F329 Major depressive disorder, single episode, unspecified: Secondary | ICD-10-CM

## 2012-03-07 DIAGNOSIS — E785 Hyperlipidemia, unspecified: Secondary | ICD-10-CM

## 2012-03-07 DIAGNOSIS — F17200 Nicotine dependence, unspecified, uncomplicated: Secondary | ICD-10-CM

## 2012-03-07 DIAGNOSIS — E669 Obesity, unspecified: Secondary | ICD-10-CM

## 2012-03-07 MED ORDER — ALPRAZOLAM 0.25 MG PO TABS: ORAL_TABLET | ORAL | Status: AC

## 2012-03-07 NOTE — Patient Instructions (Signed)
F/u in end June  Fasting lipid, cmp and eGFR and hBA1C approx 3 to 7 days [facility] app[facility].  Blood pressure is high, pls commit to 30 minutes of exercise before you leave work every day and continue weekend exercise.  Dose increase on cozaar to 50mg  daily, ok to take tWO 25mg  tablets  Set weight loss goal of 2.5 pounds per month  Reduce cigarettes by 1 per month  cONGRATS on checking and logging blood sugars

## 2012-03-07 NOTE — Progress Notes (Signed)
  Subjective:    Patient ID: Alicia Neal, female    DOB: 01-23-1961, 51 y.o.   MRN: [id number]  HPI The PT is here for follow up and re-evaluation of chronic medical conditions, medication management and review of any available recent lab and radiology data.  Preventive health is updated, specifically  Cancer screening and Immunization.   Questions or concerns regarding consultations or procedures which the PT has had in the interim are  Addressed.Has had to have prednisone at high doses started in past several weeks due to pemphigoid flare. She has extreme anxiety and insomnia as a result  There are no new concerns.  There are no specific complaints  She has worked on dietary modification and recording blood sugars has her diary whch shows good numbers      Review of Systems See HPI Denies recent fever or chills. Denies sinus pressure, nasal congestion, ear pain or sore throat. Denies chest congestion, productive cough or wheezing. Denies chest pains, palpitations and leg swelling Denies abdominal pain, nausea, vomiting,diarrhea or constipation.   Denies dysuria, frequency, hesitancy or incontinence. Denies joint pain, swelling and limitation in mobility. Denies headaches, seizures, numbness, or tingling. Denies depression,has increased  Anxiety and insomnia. Flare of oral ulcers in past several weeks.        Objective:   Physical Exam  Patient alert and oriented and in no cardiopulmonary distress.  HEENT: No facial asymmetry, EOMI, no sinus tenderness,  oropharynx pink and moist.  Neck supple no adenopathy.  Chest: Clear to auscultation bilaterally.Decreased air entry throughout  CVS: S1, S2 no murmurs, no S3.  ABD: Soft non tender. Bowel sounds normal.  Ext: No edema  MS: Adequate ROM spine, shoulders, hips and knees.  Skin: Intact, no ulcerations or rash noted.  Psych: Good eye contact, normal affect. Memory intact not anxious or depressed appearing.  CNS:  CN 2-12 intact, power, tone and sensation normal throughout.       Assessment & Plan:

## 2012-03-07 NOTE — Assessment & Plan Note (Signed)
Currently on prednisone 30mg  , has slow taper from 40 mg start for pemphigoid flare orally, has extreme anxiety and some insonmnia

## 2012-03-11 DIAGNOSIS — L121 Cicatricial pemphigoid: Secondary | ICD-10-CM | POA: Insufficient documentation

## 2012-03-11 NOTE — Assessment & Plan Note (Signed)
Controlled, no change in medication  

## 2012-03-11 NOTE — Assessment & Plan Note (Signed)
Marked improvement, pt now testing dail has record which shows fasting sugars to average between 120 to 130, look forward to HBa1C result. Needs to start regular exercise

## 2012-03-11 NOTE — Assessment & Plan Note (Signed)
Unchanged. Patient re-educated about  the importance of commitment to a  minimum of 150 minutes of exercise per week. The importance of healthy food choices with portion control discussed. Encouraged to start a food diary, count calories and to consider  joining a support group. Sample diet sheets offered. Goals set by the patient for the next several months.    

## 2012-03-11 NOTE — Assessment & Plan Note (Signed)
Uncontrolled increase in med dose

## 2012-03-11 NOTE — Assessment & Plan Note (Signed)
Hyperlipidemia:Low fat diet discussed and encouraged.  currently compliant with pravastatin, no adverse effects noted

## 2012-03-11 NOTE — Assessment & Plan Note (Signed)
Currently having flare of oral ulcers , on high dose prednisone through derm at [facility]

## 2012-03-11 NOTE — Assessment & Plan Note (Signed)
Unchanged

## 2012-03-11 NOTE — Assessment & Plan Note (Signed)
Improved, and controlled on current med

## 2012-04-09 ENCOUNTER — Other Ambulatory Visit: Payer: Self-pay | Admitting: Family Medicine

## 2012-04-19 LAB — LIPID PANEL
LDL Cholesterol: 113 mg/dL — ABNORMAL HIGH (ref 0–99)
Total CHOL/HDL Ratio: 3.9 Ratio

## 2012-04-19 LAB — COMPLETE METABOLIC PANEL WITH GFR
ALT: 23 U/L (ref 0–35)
Albumin: 4.4 g/dL (ref 3.5–5.2)
Alkaline Phosphatase: 69 U/L (ref 39–117)
CO2: 26 mEq/L (ref 19–32)
GFR, Est Non African American: >89 mL/min
Glucose, Bld: 125 mg/dL — ABNORMAL HIGH (ref 70–99)
Potassium: 4.5 mEq/L (ref 3.5–5.3)
Sodium: 138 mEq/L (ref 135–145)
Total Protein: 6.8 g/dL (ref 6.0–8.3)

## 2012-04-21 LAB — HEMOGLOBIN A1C
Hgb A1c MFr Bld: 6.6 % — ABNORMAL HIGH (ref <5.7)
Mean Plasma Glucose: 143 mg/dL — ABNORMAL HIGH (ref <117)

## 2012-04-23 ENCOUNTER — Ambulatory Visit (INDEPENDENT_AMBULATORY_CARE_PROVIDER_SITE_OTHER): Payer: 59 | Admitting: Family Medicine

## 2012-04-23 VITALS — BP 122/84 | HR 88 | Resp 16 | Ht 64.0 in | Wt 186.0 lb

## 2012-04-23 DIAGNOSIS — G4730 Sleep apnea, unspecified: Secondary | ICD-10-CM

## 2012-04-23 DIAGNOSIS — E1165 Type 2 diabetes mellitus with hyperglycemia: Secondary | ICD-10-CM

## 2012-04-23 DIAGNOSIS — F17200 Nicotine dependence, unspecified, uncomplicated: Secondary | ICD-10-CM

## 2012-04-23 DIAGNOSIS — I10 Essential (primary) hypertension: Secondary | ICD-10-CM

## 2012-04-23 DIAGNOSIS — E559 Vitamin D deficiency, unspecified: Secondary | ICD-10-CM

## 2012-04-23 DIAGNOSIS — IMO0001 Reserved for inherently not codable concepts without codable children: Secondary | ICD-10-CM

## 2012-04-23 DIAGNOSIS — IMO0002 Reserved for concepts with insufficient information to code with codable children: Secondary | ICD-10-CM

## 2012-04-23 DIAGNOSIS — E785 Hyperlipidemia, unspecified: Secondary | ICD-10-CM

## 2012-04-23 DIAGNOSIS — E669 Obesity, unspecified: Secondary | ICD-10-CM

## 2012-04-23 NOTE — Patient Instructions (Addendum)
F/u in 3 month  CONGRATS on excellent  blood pressure, and cholesterol.  CONGRATS on commiting to regular exercise, pls set a goal of 5 days per week for at least 30 minutes each day.  CONGRATS blood sugar average is now 6.6!!!   You are being referred for chest CT scan and you really dO need to commit on cutting down cigarette use consistently, you NEED to quit!  Plan on cutting back by at least 6 cigarettes in the next 12 weeks please.  Start calcium with D gel cap 1200mg /1000IU one daily for [facility] please  You are being referred for a sleep study and to see Dr Gerilyn Pilgrim, you need your sleep apnea TREATED    Call for zyrtec to augment the singulair since your allergy symptoms are uncontrolled.  OK to use sudafed one daily for a few days when your symptoms of drainage and pressure are excessive  HBA1C and Vit D  in 3 months, non fasting

## 2012-04-23 NOTE — Progress Notes (Signed)
  Subjective:    Patient ID: Alicia Neal, female    DOB: [birth year], 51 y.o.   MRN: [id number]  HPI The PT is here for follow up and re-evaluation of chronic medical conditions, medication management and review of any available recent lab and radiology data.  Preventive health is updated, specifically  Cancer screening and Immunization.   Questions or concerns regarding consultations or procedures which the PT has had in the interim are  addressed. The PT denies any adverse reactions to current medications since the last visit.  C/o excessive fatigue and daytime sleepiness, was diagnosed with sleep apnea years agfo, never "tolerated" equipment. Nicotine use unchanged. Has been consisten with blood sugar testing and taking medication, and she adjusts her diet based on blood sugar results, eg cutting back on water melon    Review of Systems See HPI Denies recent fever or chills. Denies sinus pressure, nasal congestion, ear pain or sore throat. Denies chest congestion, productive cough or wheezing. Denies chest pains, palpitations and leg swelling Denies abdominal pain, nausea, vomiting,diarrhea or constipation.   Denies dysuria, frequency, hesitancy or incontinence. Denies joint pain, swelling and limitation in mobility. Denies headaches, seizures, numbness, or tingling. Denies depression, anxiety or insomnia. Denies skin break down or rash.        Objective:   Physical Exam Patient alert and oriented and in no cardiopulmonary distress.  HEENT: No facial asymmetry, EOMI, no sinus tenderness,  oropharynx pink and moist.  Neck supple no adenopathy.  Chest: Clear to auscultation bilaterally.Decreased air entry bilaterally  CVS: S1, S2 no murmurs, no S3.  ABD: Soft non tender. Bowel sounds normal.  Ext: No edema  MS: Adequate ROM spine, shoulders, hips and knees.  Skin: Intact, no ulcerations or rash noted.  Psych: Good eye contact, normal affect. Memory intact not  anxious or depressed appearing.  CNS: CN 2-12 intact, power, tone and sensation normal throughout.        Assessment & Plan:

## 2012-04-24 ENCOUNTER — Encounter: Payer: Self-pay | Admitting: Family Medicine

## 2012-04-24 NOTE — Assessment & Plan Note (Signed)
Verbally HBA1C is 6.6 marked improvement, pt congratulated on this

## 2012-04-24 NOTE — Assessment & Plan Note (Signed)
Unchanged, pt has comited to regular physical activity Patient re-educated about  the importance of commitment to a  minimum of 150 minutes of exercise per week. The importance of healthy food choices with portion control discussed. Encouraged to start a food diary, count calories and to consider  joining a support group. Sample diet sheets offered. Goals set by the patient for the next several months.

## 2012-04-24 NOTE — Assessment & Plan Note (Signed)
Marked improvement, no med change, further attention to low fat diet

## 2012-04-24 NOTE — Assessment & Plan Note (Signed)
Symptomatic and untreated, refer for study and to sleep specialist

## 2012-04-24 NOTE — Assessment & Plan Note (Signed)
Controlled, no change in medication  

## 2012-04-24 NOTE — Assessment & Plan Note (Signed)
Unchanged, strong family h/o lung cancer, chest ct ordered, counseled to quit

## 2012-04-25 ENCOUNTER — Ambulatory Visit (HOSPITAL_COMMUNITY)
Admission: RE | Admit: 2012-04-25 | Discharge: 2012-04-25 | Disposition: A | Discharge status: Home / Self Care | Payer: 59 | Source: Ambulatory Visit | Attending: Family Medicine | Admitting: Family Medicine

## 2012-04-25 DIAGNOSIS — E279 Disorder of adrenal gland, unspecified: Secondary | ICD-10-CM | POA: Insufficient documentation

## 2012-04-25 DIAGNOSIS — R059 Cough, unspecified: Secondary | ICD-10-CM | POA: Insufficient documentation

## 2012-04-25 DIAGNOSIS — F17200 Nicotine dependence, unspecified, uncomplicated: Secondary | ICD-10-CM | POA: Insufficient documentation

## 2012-04-25 DIAGNOSIS — I10 Essential (primary) hypertension: Secondary | ICD-10-CM | POA: Insufficient documentation

## 2012-04-25 DIAGNOSIS — K7689 Other specified diseases of liver: Secondary | ICD-10-CM | POA: Insufficient documentation

## 2012-04-25 DIAGNOSIS — R05 Cough: Secondary | ICD-10-CM | POA: Insufficient documentation

## 2012-04-25 DIAGNOSIS — E119 Type 2 diabetes mellitus without complications: Secondary | ICD-10-CM | POA: Insufficient documentation

## 2012-04-25 MED ORDER — IOHEXOL 300 MG/ML  SOLN: 100.0000 mL | Freq: Once | INTRAMUSCULAR | Status: AC | PRN

## 2012-05-04 ENCOUNTER — Telehealth: Payer: Self-pay | Admitting: Family Medicine

## 2012-05-07 ENCOUNTER — Other Ambulatory Visit: Payer: Self-pay | Admitting: Family Medicine

## 2012-05-08 NOTE — Telephone Encounter (Signed)
Patient is aware 

## 2012-05-14 ENCOUNTER — Telehealth: Payer: Self-pay | Admitting: Family Medicine

## 2012-05-14 MED ORDER — MONTELUKAST SODIUM 10 MG PO TABS: ORAL_TABLET | ORAL | Status: DC

## 2012-05-14 NOTE — Telephone Encounter (Signed)
Had already been refilled last month but I sent in again

## 2012-05-21 ENCOUNTER — Other Ambulatory Visit: Payer: Self-pay

## 2012-05-21 MED ORDER — SITAGLIPTIN PHOS-METFORMIN HCL 50-1000 MG PO TABS: 1.0000 | ORAL_TABLET | Freq: Two times a day (BID) | ORAL | Status: DC

## 2012-07-11 ENCOUNTER — Other Ambulatory Visit: Payer: Self-pay | Admitting: Family Medicine

## 2012-07-23 ENCOUNTER — Telehealth: Payer: Self-pay | Admitting: Family Medicine

## 2012-07-23 DIAGNOSIS — I10 Essential (primary) hypertension: Secondary | ICD-10-CM

## 2012-07-24 MED ORDER — LOSARTAN POTASSIUM 50 MG PO TABS: 50.0000 mg | ORAL_TABLET | Freq: Every day | ORAL | Status: DC

## 2012-07-24 NOTE — Telephone Encounter (Signed)
Sent in to [facility]

## 2012-08-06 ENCOUNTER — Other Ambulatory Visit: Payer: Self-pay

## 2012-08-06 MED ORDER — CITALOPRAM HYDROBROMIDE 40 MG PO TABS: 40.0000 mg | ORAL_TABLET | Freq: Every day | ORAL | Status: DC

## 2012-08-13 ENCOUNTER — Other Ambulatory Visit: Payer: Self-pay | Admitting: Family Medicine

## 2012-08-14 ENCOUNTER — Other Ambulatory Visit: Payer: Self-pay | Admitting: Family Medicine

## 2012-08-14 DIAGNOSIS — Z139 Encounter for screening, unspecified: Secondary | ICD-10-CM

## 2012-08-15 ENCOUNTER — Encounter: Payer: Self-pay | Admitting: Family Medicine

## 2012-08-17 LAB — VITAMIN D 25 HYDROXY (VIT D DEFICIENCY, FRACTURES): Vit D, 25-Hydroxy: 59 ng/mL (ref 30–89)

## 2012-08-21 ENCOUNTER — Inpatient Hospital Stay (HOSPITAL_COMMUNITY): Admission: RE | Admit: 2012-08-21 | Payer: 59 | Source: Ambulatory Visit

## 2012-10-16 ENCOUNTER — Telehealth: Payer: Self-pay | Admitting: Family Medicine

## 2012-10-16 DIAGNOSIS — E785 Hyperlipidemia, unspecified: Secondary | ICD-10-CM

## 2012-10-16 DIAGNOSIS — E1165 Type 2 diabetes mellitus with hyperglycemia: Secondary | ICD-10-CM

## 2012-10-16 DIAGNOSIS — IMO0002 Reserved for concepts with insufficient information to code with codable children: Secondary | ICD-10-CM

## 2012-10-16 NOTE — Telephone Encounter (Signed)
Fasting lipid, cmp and eGFR needed

## 2012-10-16 NOTE — Telephone Encounter (Signed)
What labs are to be ordered?

## 2012-10-17 NOTE — Telephone Encounter (Signed)
Labs ordered and message left on personal voicemail notifying patient.

## 2012-10-24 ENCOUNTER — Ambulatory Visit (INDEPENDENT_AMBULATORY_CARE_PROVIDER_SITE_OTHER): Payer: 59 | Admitting: Family Medicine

## 2012-10-24 ENCOUNTER — Other Ambulatory Visit: Payer: Self-pay | Admitting: Family Medicine

## 2012-10-24 ENCOUNTER — Encounter: Payer: Self-pay | Admitting: Family Medicine

## 2012-10-24 VITALS — BP 150/96 | HR 71 | Resp 16 | Ht 64.0 in | Wt 173.0 lb

## 2012-10-24 DIAGNOSIS — F3289 Other specified depressive episodes: Secondary | ICD-10-CM

## 2012-10-24 DIAGNOSIS — F329 Major depressive disorder, single episode, unspecified: Secondary | ICD-10-CM

## 2012-10-24 DIAGNOSIS — E669 Obesity, unspecified: Secondary | ICD-10-CM

## 2012-10-24 DIAGNOSIS — F17200 Nicotine dependence, unspecified, uncomplicated: Secondary | ICD-10-CM

## 2012-10-24 DIAGNOSIS — E538 Deficiency of other specified B group vitamins: Secondary | ICD-10-CM | POA: Insufficient documentation

## 2012-10-24 DIAGNOSIS — I10 Essential (primary) hypertension: Secondary | ICD-10-CM

## 2012-10-24 DIAGNOSIS — J301 Allergic rhinitis due to pollen: Secondary | ICD-10-CM

## 2012-10-24 DIAGNOSIS — K219 Gastro-esophageal reflux disease without esophagitis: Secondary | ICD-10-CM | POA: Insufficient documentation

## 2012-10-24 DIAGNOSIS — F411 Generalized anxiety disorder: Secondary | ICD-10-CM

## 2012-10-24 DIAGNOSIS — E119 Type 2 diabetes mellitus without complications: Secondary | ICD-10-CM

## 2012-10-24 DIAGNOSIS — E785 Hyperlipidemia, unspecified: Secondary | ICD-10-CM

## 2012-10-24 DIAGNOSIS — F32A Depression, unspecified: Secondary | ICD-10-CM

## 2012-10-24 LAB — COMPLETE METABOLIC PANEL WITH GFR
ALT: 13 U/L (ref 0–35)
CO2: 29 mEq/L (ref 19–32)
Calcium: 9.6 mg/dL (ref 8.4–10.5)
Chloride: 101 mEq/L (ref 96–112)
Creat: 0.71 mg/dL (ref 0.50–1.10)
GFR, Est African American: >89 mL/min
Sodium: 138 mEq/L (ref 135–145)
Total Protein: 6.7 g/dL (ref 6.0–8.3)

## 2012-10-24 LAB — LIPID PANEL
LDL Cholesterol: 82 mg/dL (ref 0–99)
Triglycerides: 151 mg/dL — ABNORMAL HIGH (ref <150)

## 2012-10-24 MED ORDER — ESOMEPRAZOLE MAGNESIUM 20 MG PO CPDR: 20.0000 mg | DELAYED_RELEASE_CAPSULE | Freq: Every day | ORAL | Status: DC

## 2012-10-24 MED ORDER — FLUTICASONE PROPIONATE 50 MCG/ACT NA SUSP: 2.0000 | Freq: Every day | NASAL | Status: DC

## 2012-10-24 NOTE — Assessment & Plan Note (Signed)
Uncontrolled DASH diet and commitment to daily physical activity for a minimum of 30 minutes discussed and encouraged, as a part of hypertension management. The importance of attaining a healthy weight is also discussed. Return in 6 weeks

## 2012-10-24 NOTE — Assessment & Plan Note (Signed)
Relates stress as a big factor. Willing to cut back to 15 per day and by 1 cigarette every 2 to 4 weeks, knows she needs to quit

## 2012-10-24 NOTE — Assessment & Plan Note (Signed)
Improved. Pt applauded on succesful weight loss through lifestyle change, and encouraged to continue same. Weight loss goal set for the next several months.  

## 2012-10-24 NOTE — Assessment & Plan Note (Signed)
Uncontrolled, pt encouraged to start daily flonase

## 2012-10-24 NOTE — Assessment & Plan Note (Signed)
Controlled, no change in medication  

## 2012-10-24 NOTE — Assessment & Plan Note (Signed)
Uses nexium

## 2012-10-24 NOTE — Assessment & Plan Note (Signed)
Controlled and improved. Pt applauded on dietary change and weight loss which has facilitated this. She needs to start walking

## 2012-10-24 NOTE — Assessment & Plan Note (Signed)
Updated lab today Hyperlipidemia:Low fat diet discussed and encouraged.   

## 2012-10-24 NOTE — Assessment & Plan Note (Signed)
Reports starting monthly injections through neurology, needs updated lab which I will fax to dr Gerilyn Pilgrim

## 2012-10-24 NOTE — Patient Instructions (Addendum)
F/U end January  Congrats on weight loss, and change in eating   Start 15 ciggs per day, and plan to reduce by 1 every 2 to 4 weeks.   It is important that you exercise regularly at least 15 minutes 5 times a week. If you develop chest pain, have severe difficulty breathing, or feel very tired, stop exercising immediately and seek medical attention    Reconsider the plan to have sleep study  Reconsider methotrexate  Blood pressure is high today, this is a concern  Resume flonase  And nexium will be sent in

## 2012-10-24 NOTE — Progress Notes (Signed)
  Subjective:    Patient ID: Alicia Neal, female    DOB: 16-Mar-1961, 51 y.o.   MRN: [id number]  HPI The PT is here for follow up and re-evaluation of chronic medical conditions, medication management and review of any available recent lab and radiology data.  Preventive health is updated, specifically  Cancer screening and Immunization.   Questions or concerns regarding consultations or procedures which the PT has had in the interim are  Addressed.Was evaluated by sleep specialist, has B12 deficiency and has been receiving monthly injections for this, states she feels better, and is still delaying the sleep study. 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  Feels very stressed as far as work is concerned, nicotine averages 1PPD       Review of Systems .See HPI Denies recent fever or chills. Denies sinus pressure, nasal congestion, ear pain or sore throat. Denies chest congestion, productive cough or wheezing. Denies chest pains, palpitations and leg swelling Denies abdominal pain, nausea, vomiting,diarrhea or constipation.   Denies dysuria, frequency, hesitancy or incontinence. Denies joint pain, swelling and limitation in mobility. Denies headaches, seizures, numbness, or tingling. Denies depression, anxiety when she takes prednisone, uses xanax to help with sleep.        Objective:   Physical Exam Patient alert and oriented and in no cardiopulmonary distress.  HEENT: No facial asymmetry, EOMI, no sinus tenderness,  oropharynx pink and moist.  Neck supple no adenopathy.Erytrhema and edema of nasal mucosa  Chest: Clear to auscultation bilaterally.  CVS: S1, S2 no murmurs, no S3.  ABD: Soft non tender. Bowel sounds normal.  Ext: No edema  MS: Adequate ROM spine, shoulders, hips and  knees.  Skin: Intact, no ulcerations or rash noted.  Psych: Good eye contact, normal affect. Memory intact not anxious or depressed appearing.  CNS: CN 2-12 intact, power, tone and sensation normal throughout.        Assessment & Plan:

## 2012-10-24 NOTE — Assessment & Plan Note (Signed)
Increased work stress, coping by smoking more. Encouraged pt to start walking daily [employer] reduction in nicotine every 2 to 4 weeks

## 2012-12-07 ENCOUNTER — Ambulatory Visit: Payer: 59 | Admitting: Family Medicine

## 2012-12-22 ENCOUNTER — Other Ambulatory Visit: Payer: Self-pay

## 2013-04-11 ENCOUNTER — Telehealth: Payer: Self-pay | Admitting: Family Medicine

## 2013-04-11 DIAGNOSIS — E119 Type 2 diabetes mellitus without complications: Secondary | ICD-10-CM

## 2013-04-11 DIAGNOSIS — R5381 Other malaise: Secondary | ICD-10-CM

## 2013-04-11 DIAGNOSIS — E669 Obesity, unspecified: Secondary | ICD-10-CM

## 2013-04-11 DIAGNOSIS — E785 Hyperlipidemia, unspecified: Secondary | ICD-10-CM

## 2013-04-11 DIAGNOSIS — R5383 Other fatigue: Secondary | ICD-10-CM

## 2013-04-11 NOTE — Telephone Encounter (Signed)
Microalb, tSH, fasting lipid, cmp and EGFR, cBC pls

## 2013-04-11 NOTE — Telephone Encounter (Signed)
What labs should be ordered?  

## 2013-04-12 NOTE — Telephone Encounter (Signed)
Ordered and will fax 

## 2013-04-15 ENCOUNTER — Other Ambulatory Visit: Payer: Self-pay | Admitting: Family Medicine

## 2013-04-15 ENCOUNTER — Ambulatory Visit (INDEPENDENT_AMBULATORY_CARE_PROVIDER_SITE_OTHER): Payer: 59 | Admitting: Family Medicine

## 2013-04-15 ENCOUNTER — Ambulatory Visit (HOSPITAL_COMMUNITY)
Admission: RE | Admit: 2013-04-15 | Discharge: 2013-04-15 | Disposition: A | Discharge status: Home / Self Care | Payer: 59 | Source: Ambulatory Visit | Attending: Family Medicine | Admitting: Family Medicine

## 2013-04-15 ENCOUNTER — Encounter: Payer: Self-pay | Admitting: Family Medicine

## 2013-04-15 VITALS — BP 134/82 | HR 76 | Resp 18 | Ht 64.0 in | Wt 175.1 lb

## 2013-04-15 DIAGNOSIS — E785 Hyperlipidemia, unspecified: Secondary | ICD-10-CM

## 2013-04-15 DIAGNOSIS — R3129 Other microscopic hematuria: Secondary | ICD-10-CM

## 2013-04-15 DIAGNOSIS — F3289 Other specified depressive episodes: Secondary | ICD-10-CM

## 2013-04-15 DIAGNOSIS — E119 Type 2 diabetes mellitus without complications: Secondary | ICD-10-CM

## 2013-04-15 DIAGNOSIS — M549 Dorsalgia, unspecified: Secondary | ICD-10-CM

## 2013-04-15 DIAGNOSIS — F17200 Nicotine dependence, unspecified, uncomplicated: Secondary | ICD-10-CM

## 2013-04-15 DIAGNOSIS — Z8742 Personal history of other diseases of the female genital tract: Secondary | ICD-10-CM

## 2013-04-15 DIAGNOSIS — R319 Hematuria, unspecified: Secondary | ICD-10-CM | POA: Insufficient documentation

## 2013-04-15 DIAGNOSIS — F32A Depression, unspecified: Secondary | ICD-10-CM

## 2013-04-15 DIAGNOSIS — F329 Major depressive disorder, single episode, unspecified: Secondary | ICD-10-CM

## 2013-04-15 DIAGNOSIS — E669 Obesity, unspecified: Secondary | ICD-10-CM

## 2013-04-15 DIAGNOSIS — N390 Urinary tract infection, site not specified: Secondary | ICD-10-CM | POA: Insufficient documentation

## 2013-04-15 DIAGNOSIS — I10 Essential (primary) hypertension: Secondary | ICD-10-CM

## 2013-04-15 LAB — POCT URINALYSIS DIPSTICK
Bilirubin, UA: NEGATIVE
Clarity, UA: NEGATIVE
Glucose, UA: NEGATIVE
Nitrite, UA: NEGATIVE

## 2013-04-15 LAB — CBC WITH DIFFERENTIAL/PLATELET
Eosinophils Relative: 4 % (ref 0–5)
HCT: 38.1 % (ref 36.0–46.0)
Hemoglobin: 13.1 g/dL (ref 12.0–15.0)
Lymphocytes Relative: 19 % (ref 12–46)
Lymphs Abs: 1.7 10*3/uL (ref 0.7–4.0)
MCV: 84.5 fL (ref 78.0–100.0)
Monocytes Absolute: 0.6 10*3/uL (ref 0.1–1.0)
RBC: 4.51 MIL/uL (ref 3.87–5.11)
WBC: 9 10*3/uL (ref 4.0–10.5)

## 2013-04-15 LAB — COMPLETE METABOLIC PANEL WITH GFR
Alkaline Phosphatase: 57 U/L (ref 39–117)
GFR, Est Non African American: >89 mL/min
Glucose, Bld: 116 mg/dL — ABNORMAL HIGH (ref 70–99)
Sodium: 136 mEq/L (ref 135–145)
Total Bilirubin: 0.5 mg/dL (ref 0.3–1.2)
Total Protein: 6.8 g/dL (ref 6.0–8.3)

## 2013-04-15 LAB — LIPID PANEL
HDL: 46 mg/dL (ref >39)
LDL Cholesterol: 113 mg/dL — ABNORMAL HIGH (ref 0–99)
Total CHOL/HDL Ratio: 4.2 Ratio

## 2013-04-15 MED ORDER — BUPROPION HCL ER (SR) 150 MG PO TB12: 150.0000 mg | ORAL_TABLET | Freq: Two times a day (BID) | ORAL | Status: DC

## 2013-04-15 MED ORDER — HYDROCODONE-ACETAMINOPHEN 7.5-300 MG PO TABS: ORAL_TABLET | ORAL | Status: AC

## 2013-04-15 MED ORDER — METHYLPREDNISOLONE ACETATE 80 MG/ML IJ SUSP
80.0000 mg | Freq: Once | INTRAMUSCULAR | Status: AC
  Administered 2013-04-15: 80 mg via INTRAMUSCULAR

## 2013-04-15 MED ORDER — KETOROLAC TROMETHAMINE 60 MG/2ML IM SOLN
60.0000 mg | Freq: Once | INTRAMUSCULAR | Status: AC
  Administered 2013-04-15: 60 mg via INTRAMUSCULAR

## 2013-04-15 MED ORDER — IBUPROFEN 800 MG PO TABS: 800.0000 mg | ORAL_TABLET | Freq: Three times a day (TID) | ORAL | Status: AC | PRN

## 2013-04-15 MED ORDER — CIPROFLOXACIN HCL 500 MG PO TABS: 500.0000 mg | ORAL_TABLET | Freq: Two times a day (BID) | ORAL | Status: AC

## 2013-04-15 MED ORDER — CYCLOBENZAPRINE HCL 10 MG PO TABS: 10.0000 mg | ORAL_TABLET | Freq: Three times a day (TID) | ORAL | Status: DC | PRN

## 2013-04-15 NOTE — Progress Notes (Signed)
  Subjective:    Patient ID: Alicia Neal, female    DOB: 1961/01/24, 52 y.o.   MRN: [id number]  HPI The PT is here for follow up and re-evaluation of chronic medical conditions, medication management and review of any available recent lab and radiology data.  Preventive health is updated, specifically  Cancer screening and Immunization.  Mammogram to be scheduled.  The PT denies any adverse reactions to current medications since the last visit.  6 day h/o intermittent sharp right lumbar pain raditing to groin, up to an 8 8, has past h/o disc disease, no intervention, no precipitating factor noted. No aggravating factor noted. Tends to be worse at night , waking pt from sleep in the past 6 days, and she breaks out in sweat.  Pain sometimes extends to right anterior thigh to the knee. Denies lower extremity weakness or numbness, and has no incontinence of stool or urine Ready to quit smoking , feels heavy in the chest , full of smoke, current is 15 per day. Will start wellbutrin Denies any urinary symptoms  Fasting sugars averaging 140's, not exercising as much as she would like       Review of Systems See HPI Denies recent fever or chills. Denies sinus pressure, nasal congestion, ear pain or sore throat.  Denies chest pains, palpitations and leg swelling Denies abdominal pain, nausea, vomiting,diarrhea or constipation.   C/o urinary frequency and malodour Denies headaches, seizures, numbness, or tingling. Denies uncontrolled  depression, anxiety or insomnia. Denies skin break down or rash.No recent flare of her pemphigoid disease        Objective:   Physical Exam  Patient alert and oriented and in no cardiopulmonary distress.pt in pain, unable to find a comfortable position to sit   HEENT: No facial asymmetry, EOMI, no sinus tenderness,  oropharynx pink and moist.  Neck supple no adenopathy.  Chest: Clear to auscultation bilaterally.Decreased air entry  CVS: S1, S2 no  murmurs, no S3.  ABD: Soft non tender. Bowel sounds normal.  Ext: No edema  MS: decreased  ROM spine, adequate in shoulders, hips and knees.  Skin: Intact, no ulcerations or rash noted.  Psych: Good eye contact, normal affect. Memory intact not anxious or depressed appearing.  CNS: CN 2-12 intact, power, tone and sensation normal throughout.       Assessment & Plan:

## 2013-04-15 NOTE — Patient Instructions (Addendum)
F/u in 6 weeks, call if you need me before  Urine suggests infection,though you deny urinary symptoms, this will be sent for culture, and you will be prescribed a 5 day course of antibiotics, we will contact you with f/u on labs and you can view this in my chart also  History and exam is more consistent with a flare of your disc disease, Toradol 60 mg IM in the office and depo medrol 80mg  Im . Take ibuprofen 800mg  as prescribed for 10 days start tomorrow, also prednisone 10mg  tablets one twice daily for 5 days start today.  Muscle relaxant sent in for 3 times  Daily as needed. Has sedative effect so limit to bedtime use  Initially.  Vicodin is also prescribed if pain is excessive, for bedtime use  Since there is blood in your urine with severe back pain , you are referred for ultrasound of your kidneys also  Please set a date to quit smoking latest 2 weeks after you start wellbutrin.Good to stop TODAY. Take wellbutrin once daily for 3 days then twice daily, as prescribed  You are referred for pelvic exam  Please schedule your mammogram  Pneumonia vaccine next visit

## 2013-04-16 ENCOUNTER — Ambulatory Visit (HOSPITAL_COMMUNITY): Payer: 59

## 2013-04-16 LAB — MICROALBUMIN / CREATININE URINE RATIO: Microalb Creat Ratio: 6 mg/g (ref 0.0–30.0)

## 2013-04-17 ENCOUNTER — Encounter: Payer: Self-pay | Admitting: *Deleted

## 2013-04-17 LAB — URINE CULTURE: Colony Count: >=100000

## 2013-04-21 NOTE — Assessment & Plan Note (Signed)
Deteriorated, has been non compliant with med, importance of same stressed Hyperlipidemia:Low fat diet discussed and encouraged. \

## 2013-04-21 NOTE — Assessment & Plan Note (Signed)
Controlled, no change in medication DASH diet and commitment to daily physical activity for a minimum of 30 minutes discussed and encouraged, as a part of hypertension management. The importance of attaining a healthy weight is also discussed.  

## 2013-04-21 NOTE — Assessment & Plan Note (Signed)
abnormal UA, positive for e coli, pt treated with atibiotics

## 2013-04-21 NOTE — Assessment & Plan Note (Signed)
New onset , severe and disabling. Anti inflammatories. Needs MRI based on history

## 2013-04-21 NOTE — Assessment & Plan Note (Signed)
Controlled, no change in medication  

## 2013-04-21 NOTE — Assessment & Plan Note (Signed)
Unchanged. Patient re-educated about  the importance of commitment to a  minimum of 150 minutes of exercise per week. The importance of healthy food choices with portion control discussed. Encouraged to start a food diary, count calories and to consider  joining a support group. Sample diet sheets offered. Goals set by the patient for the next several months.    

## 2013-04-21 NOTE — Assessment & Plan Note (Signed)
Unchanged,tates she is ready to quit, unwilling to set quit ate, will start wellbutrin Patient counseled for approximately 5 minutes regarding the health risks of ongoing nicotine use, specifically all types of cancer, heart disease, stroke and respiratory failure. The options available for help with cessation ,the behavioral changes to assist the process, and the option to either gradully reduce usage  Or abruptly stop.is also discussed. Pt is also encouraged to set specific goals in number of cigarettes used daily, as well as to set a quit date.

## 2013-04-30 ENCOUNTER — Ambulatory Visit (HOSPITAL_COMMUNITY)
Admission: RE | Admit: 2013-04-30 | Discharge: 2013-04-30 | Disposition: A | Discharge status: Home / Self Care | Payer: 59 | Source: Ambulatory Visit | Attending: Family Medicine | Admitting: Family Medicine

## 2013-04-30 DIAGNOSIS — M545 Low back pain, unspecified: Secondary | ICD-10-CM | POA: Insufficient documentation

## 2013-04-30 DIAGNOSIS — M51379 Other intervertebral disc degeneration, lumbosacral region without mention of lumbar back pain or lower extremity pain: Secondary | ICD-10-CM | POA: Insufficient documentation

## 2013-04-30 DIAGNOSIS — M5137 Other intervertebral disc degeneration, lumbosacral region: Secondary | ICD-10-CM | POA: Insufficient documentation

## 2013-04-30 DIAGNOSIS — M549 Dorsalgia, unspecified: Secondary | ICD-10-CM

## 2013-04-30 DIAGNOSIS — M5126 Other intervertebral disc displacement, lumbar region: Secondary | ICD-10-CM | POA: Insufficient documentation

## 2013-05-06 ENCOUNTER — Encounter: Payer: Self-pay | Admitting: Obstetrics and Gynecology

## 2013-05-16 ENCOUNTER — Other Ambulatory Visit: Payer: Self-pay

## 2013-05-21 ENCOUNTER — Other Ambulatory Visit: Payer: Self-pay | Admitting: Obstetrics & Gynecology

## 2013-05-27 ENCOUNTER — Encounter: Payer: Self-pay | Admitting: Family Medicine

## 2013-05-27 ENCOUNTER — Ambulatory Visit: Payer: 59 | Admitting: Family Medicine

## 2013-05-28 ENCOUNTER — Other Ambulatory Visit: Payer: Self-pay

## 2013-05-28 MED ORDER — SITAGLIPTIN PHOS-METFORMIN HCL 50-1000 MG PO TABS: 1.0000 | ORAL_TABLET | Freq: Two times a day (BID) | ORAL | Status: DC

## 2013-05-31 ENCOUNTER — Other Ambulatory Visit: Payer: Self-pay

## 2013-05-31 ENCOUNTER — Other Ambulatory Visit: Payer: Self-pay | Admitting: Family Medicine

## 2013-05-31 MED ORDER — CITALOPRAM HYDROBROMIDE 40 MG PO TABS: 40.0000 mg | ORAL_TABLET | Freq: Every day | ORAL | Status: DC

## 2013-06-04 ENCOUNTER — Ambulatory Visit (INDEPENDENT_AMBULATORY_CARE_PROVIDER_SITE_OTHER): Payer: 59 | Admitting: Women's Health

## 2013-06-04 ENCOUNTER — Other Ambulatory Visit (HOSPITAL_COMMUNITY)
Admission: RE | Admit: 2013-06-04 | Discharge: 2013-06-04 | Disposition: A | Discharge status: Home / Self Care | Payer: 59 | Source: Ambulatory Visit | Attending: Obstetrics & Gynecology | Admitting: Obstetrics & Gynecology

## 2013-06-04 ENCOUNTER — Encounter: Payer: Self-pay | Admitting: Women's Health

## 2013-06-04 VITALS — BP 158/94 | Ht 61.25 in | Wt 176.8 lb

## 2013-06-04 DIAGNOSIS — Z1212 Encounter for screening for malignant neoplasm of rectum: Secondary | ICD-10-CM

## 2013-06-04 DIAGNOSIS — Z01419 Encounter for gynecological examination (general) (routine) without abnormal findings: Secondary | ICD-10-CM

## 2013-06-04 DIAGNOSIS — Z1151 Encounter for screening for human papillomavirus (HPV): Secondary | ICD-10-CM | POA: Insufficient documentation

## 2013-06-04 DIAGNOSIS — N3946 Mixed incontinence: Secondary | ICD-10-CM | POA: Insufficient documentation

## 2013-06-04 LAB — HEMOCCULT GUIAC POC 1CARD (OFFICE)

## 2013-06-04 NOTE — Progress Notes (Addendum)
Subjective:     Alicia Neal is a 52 y.o. Caucasian female here for a well woman exam.  No LMP recorded. Patient is postmenopausal. x app 9 years. States last pap smear was app 7 years ago. Had abnormal pap w/ colpo and cyrosurgery, w/ 2-3 subsequent normal paps. Not sexually active. Last mammogram sometime last year and was normal, pt states she is due for another. Last TCS app 1 yr ago, had some benign polyps, and was told next TCS 10 yrs. Has strong family h/o colorectal CA, and gastroenterologist aware of this.  Dr. Lodema Hong is PCP and manages all of chronic conditions including CHTN, DM, hyperlipidemia, depression/anxiety, she sees her q 3months, and is due for another visit in app 2 months.  She smokes 1ppd cigarettes. Has a sinus infection and presently on Keflex.  Active Ambulatory Problems    Diagnosis Date Noted  . HSV 08/18/2010  . DM type 2, goal A1c below 7 05/02/2010  . HYPERLIPIDEMIA 05/06/2010  . OBESITY 05/02/2010  . ANXIETY DISORDER 12/06/2007  . TOBACCO ABUSE 05/02/2010  . ALLERGIC RHINITIS, SEASONAL 05/02/2010  . CHEST PAIN 04/30/2010  . TRANSAMINASES, SERUM, ELEVATED 10/16/2009  . Vitamin d deficiency 01/12/2012  . HTN, goal below 130/80 01/12/2012  . Depression 01/13/2012  . 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 Sleep apnea 04/23/2012  . B12 deficiency 10/24/2012  . GERD (gastroesophageal reflux disease) 10/24/2012  . H/O abnormal cervical Papanicolaou smear 04/15/2013  . Back pain with radiation 04/15/2013  . UTI (urinary tract infection) 04/15/2013  . 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 DEPRESSION, CHRONIC, SEVERE 12/06/2007  . Obstructive chronic bronchitis with exacerbation 09/15/2008   Past Medical History  Diagnosis Date  . Hypertension   . Hyperlipidemia   . Tobacco abuse   . Obesity   . Adenomatous polyps 10/2009  . Allergic rhinitis   . Anxiety disorder   . Chronic  depression   . Abnormal Pap smear   . Diabetes mellitus without complication      The following portions of the patient's history were reviewed and updated as appropriate: allergies, current medications, past family history, past medical history, past social history, past surgical history and problem list.  Review of Systems  Review of Systems  Constitutional: Negative for fever, chills, weight loss, malaise/fatigue and diaphoresis.  HENT: Negative for hearing loss, ear pain, nosebleeds, sore throat, neck pain, tinnitus and ear discharge.+congestion, has sinus infection.   Eyes: Negative for blurred vision, double vision, photophobia, pain, discharge and redness.  Respiratory: Negative for hemoptysis, shortness of breath, wheezing and stridor.  +productive cough- sinus infection. Cardiovascular: Negative for chest pain, palpitations, orthopnea, claudication, leg swelling and PND.  Gastrointestinal: negative for abdominal pain. Negative for heartburn, nausea, vomiting, diarrhea, constipation, blood in stool and melena.  Genitourinary: Negative for dysuria, frequency, hematuria and flank pain. Does report urge and stress UI, has to wear pad daily. Musculoskeletal: Negative for myalgias, back pain, joint pain and falls.  Skin: Negative for itching and rash.  Neurological: Negative for dizziness, tingling, tremors, sensory change, speech change, focal weakness, seizures, loss of consciousness, weakness and headaches.  Endo/Heme/Allergies: Negative for environmental allergies and polydipsia. Does not bruise/bleed easily.  Psychiatric/Behavioral: Negative for worsened depression, suicidal ideas, hallucinations, memory loss and substance abuse. The patient is not nervous/anxious and does not have insomnia.        Objective:   BP 158/94  Ht 5' 1.25" (1.556  m)  Wt 176 lb 12.8 oz (80.196 kg)  BMI 33.12 kg/m2    Physical Exam  Vitals reviewed. Constitutional: She is oriented to person, place,  and time. She appears well-developed and well-nourished.  HENT:  Head: Normocephalic and atraumatic.        Right Ear: External ear normal.  Left Ear: External ear normal.  Nose: Nose normal.  Mouth/Throat: Oropharynx is clear and moist.  Eyes: Conjunctivae and EOM are normal. Pupils are equal, round, and reactive to light. Right eye exhibits no discharge. Left eye exhibits no discharge. No scleral icterus.  Neck: Normal range of motion. Neck supple. No tracheal deviation present. No thyromegaly present.  Cardiovascular: Normal rate, regular rhythm, normal heart sounds and intact distal pulses.  Exam reveals no gallop and no friction rub.   No murmur heard. Respiratory: Effort normal and breath sounds normal. No respiratory distress. She has no wheezes. She has no rales. She exhibits no tenderness.  Breasts: no dominate palpable mass, retraction or nipple discharge  GI: Soft. Bowel sounds are normal. She exhibits no distension and no mass. There is no tenderness. There is no rebound and no guarding.  Genitourinary:  Vulva is normal without lesions Vagina is pink moist without discharge Cervix normal in appearance and pap is done w/ HR HPV screening Uterus is normal size shape and contour Adnexa is negative with normal sized ovaries   Rectal: Hemoccult neg Musculoskeletal: Normal range of motion. She exhibits no edema and no tenderness.  Neurological: She is alert and oriented to person, place, and time. She has normal reflexes. She displays normal reflexes. No cranial nerve deficit. She exhibits normal muscle tone. Coordination normal.  Skin: Skin is warm and dry. No rash noted. No erythema. No pallor.  Psychiatric: She has a normal mood and affect. Her behavior is normal. Judgment and thought content normal.       Assessment:    Healthy female exam.  Mixed stress & urge incontinence- declines meds CHTN, BP today 158/94 on Cozarr 50mg  daily DM Type II on Janumet Hyperlipidemia on  Pravachol Depression/Anxiety on Celexa   Plan:   Kegel exercises daily, to call if wants med for urge UI Call Dr. Lodema Hong today about BP  Mammogram ASAP as it is due per pt TCS 10 yrs from last as recommended by GI F/U 1 yr for physical as long as pap normal  Cheral Marker, CNM, [facility] 06/04/13 @ 0930

## 2013-06-04 NOTE — Patient Instructions (Addendum)

## 2013-08-05 ENCOUNTER — Telehealth: Payer: Self-pay | Admitting: Family Medicine

## 2013-08-05 ENCOUNTER — Other Ambulatory Visit: Payer: Self-pay | Admitting: Family Medicine

## 2013-08-05 DIAGNOSIS — E119 Type 2 diabetes mellitus without complications: Secondary | ICD-10-CM

## 2013-08-05 DIAGNOSIS — E785 Hyperlipidemia, unspecified: Secondary | ICD-10-CM

## 2013-08-05 NOTE — Telephone Encounter (Signed)
Pls order fasting lipid, cmp and EGFR and HBA1c due mid to end October, BEFORE visit, pls sched an appt date and time put her at 8 am and enclose appt card , pls mail to her home she is expecting this. putr a note for her to resched the appt date given if unable pls an  Specifically ask that she get the lab BEFORE the visit, thanks (I just spoke with her)

## 2013-08-06 NOTE — Telephone Encounter (Signed)
Labs ordered and appt card enclosed with them to be mailed

## 2013-08-06 NOTE — Addendum Note (Signed)
Addended by: Abner Greenspan on: 08/06/2013 04:14 PM   Modules accepted: Orders

## 2013-08-26 ENCOUNTER — Other Ambulatory Visit: Payer: Self-pay | Admitting: Family Medicine

## 2013-09-12 ENCOUNTER — Other Ambulatory Visit: Payer: Self-pay

## 2013-09-13 ENCOUNTER — Ambulatory Visit: Payer: 59 | Admitting: Family Medicine

## 2013-11-18 ENCOUNTER — Encounter: Payer: Self-pay | Admitting: Family Medicine

## 2013-11-18 ENCOUNTER — Ambulatory Visit (INDEPENDENT_AMBULATORY_CARE_PROVIDER_SITE_OTHER): Payer: 59 | Admitting: Family Medicine

## 2013-11-18 ENCOUNTER — Ambulatory Visit (HOSPITAL_COMMUNITY)
Admission: RE | Admit: 2013-11-18 | Discharge: 2013-11-18 | Disposition: A | Discharge status: Home / Self Care | Payer: 59 | Source: Ambulatory Visit | Attending: Family Medicine | Admitting: Family Medicine

## 2013-11-18 VITALS — BP 142/86 | HR 84 | Temp 98.7°F | Resp 16 | Ht 61.25 in | Wt 180.1 lb

## 2013-11-18 DIAGNOSIS — R0609 Other forms of dyspnea: Secondary | ICD-10-CM | POA: Insufficient documentation

## 2013-11-18 DIAGNOSIS — J208 Acute bronchitis due to other specified organisms: Secondary | ICD-10-CM

## 2013-11-18 DIAGNOSIS — F411 Generalized anxiety disorder: Secondary | ICD-10-CM

## 2013-11-18 DIAGNOSIS — J441 Chronic obstructive pulmonary disease with (acute) exacerbation: Secondary | ICD-10-CM

## 2013-11-18 DIAGNOSIS — E785 Hyperlipidemia, unspecified: Secondary | ICD-10-CM

## 2013-11-18 DIAGNOSIS — A499 Bacterial infection, unspecified: Secondary | ICD-10-CM

## 2013-11-18 DIAGNOSIS — E119 Type 2 diabetes mellitus without complications: Secondary | ICD-10-CM

## 2013-11-18 DIAGNOSIS — J209 Acute bronchitis, unspecified: Secondary | ICD-10-CM

## 2013-11-18 DIAGNOSIS — B9689 Other specified bacterial agents as the cause of diseases classified elsewhere: Secondary | ICD-10-CM

## 2013-11-18 DIAGNOSIS — R05 Cough: Secondary | ICD-10-CM | POA: Insufficient documentation

## 2013-11-18 DIAGNOSIS — J329 Chronic sinusitis, unspecified: Secondary | ICD-10-CM

## 2013-11-18 DIAGNOSIS — R059 Cough, unspecified: Secondary | ICD-10-CM | POA: Insufficient documentation

## 2013-11-18 DIAGNOSIS — R509 Fever, unspecified: Secondary | ICD-10-CM | POA: Insufficient documentation

## 2013-11-18 DIAGNOSIS — R0989 Other specified symptoms and signs involving the circulatory and respiratory systems: Secondary | ICD-10-CM | POA: Insufficient documentation

## 2013-11-18 DIAGNOSIS — I10 Essential (primary) hypertension: Secondary | ICD-10-CM

## 2013-11-18 DIAGNOSIS — E669 Obesity, unspecified: Secondary | ICD-10-CM

## 2013-11-18 LAB — CBC WITH DIFFERENTIAL/PLATELET
BASOS PCT: 1 % (ref 0–1)
Basophils Absolute: 0.1 10*3/uL (ref 0.0–0.1)
EOS ABS: 0.5 10*3/uL (ref 0.0–0.7)
EOS PCT: 6 % — AB (ref 0–5)
HEMATOCRIT: 39.1 % (ref 36.0–46.0)
HEMOGLOBIN: 13.1 g/dL (ref 12.0–15.0)
Lymphocytes Relative: 23 % (ref 12–46)
Lymphs Abs: 1.7 10*3/uL (ref 0.7–4.0)
MCH: 28.5 pg (ref 26.0–34.0)
MCHC: 33.5 g/dL (ref 30.0–36.0)
MCV: 85.2 fL (ref 78.0–100.0)
MONO ABS: 0.8 10*3/uL (ref 0.1–1.0)
MONOS PCT: 11 % (ref 3–12)
Neutro Abs: 4.5 10*3/uL (ref 1.7–7.7)
Neutrophils Relative %: 59 % (ref 43–77)
Platelets: 438 10*3/uL — ABNORMAL HIGH (ref 150–400)
RBC: 4.59 MIL/uL (ref 3.87–5.11)
RDW: 13.8 % (ref 11.5–15.5)
WBC: 7.5 10*3/uL (ref 4.0–10.5)

## 2013-11-18 MED ORDER — PREDNISONE (PAK) 5 MG PO TABS: 5.0000 mg | ORAL_TABLET | ORAL | Status: DC

## 2013-11-18 MED ORDER — METHYLPREDNISOLONE ACETATE 80 MG/ML IJ SUSP
80.0000 mg | Freq: Once | INTRAMUSCULAR | Status: AC
  Administered 2013-11-18: 80 mg via INTRAMUSCULAR

## 2013-11-18 MED ORDER — CEFTRIAXONE SODIUM 500 MG IJ SOLR
500.0000 mg | Freq: Once | INTRAMUSCULAR | Status: AC
  Administered 2013-11-18: 500 mg via INTRAMUSCULAR

## 2013-11-18 MED ORDER — SULFAMETHOXAZOLE-TMP DS 800-160 MG PO TABS: 1.0000 | ORAL_TABLET | Freq: Two times a day (BID) | ORAL | Status: DC

## 2013-11-18 MED ORDER — TIOTROPIUM BROMIDE MONOHYDRATE 18 MCG IN CAPS: 18.0000 ug | ORAL_CAPSULE | Freq: Every day | RESPIRATORY_TRACT | Status: DC

## 2013-11-18 MED ORDER — ALBUTEROL SULFATE HFA 108 (90 BASE) MCG/ACT IN AERS: 2.0000 | INHALATION_SPRAY | Freq: Four times a day (QID) | RESPIRATORY_TRACT | Status: DC | PRN

## 2013-11-18 MED ORDER — HYDROCOD POLST-CHLORPHEN POLST 10-8 MG/5ML PO LQCR: 5.0000 mL | Freq: Every evening | ORAL | Status: DC | PRN

## 2013-11-18 MED ORDER — BUDESONIDE-FORMOTEROL FUMARATE 160-4.5 MCG/ACT IN AERO: 2.0000 | INHALATION_SPRAY | Freq: Two times a day (BID) | RESPIRATORY_TRACT | Status: DC

## 2013-11-18 MED ORDER — BENZONATATE 100 MG PO CAPS: 100.0000 mg | ORAL_CAPSULE | Freq: Three times a day (TID) | ORAL | Status: DC | PRN

## 2013-11-18 NOTE — Patient Instructions (Addendum)
F/u in 3.5 month, call if you need me before please  CONGRATS on stopping nicotine since 11/07/2013, that is WONDERFUL, please keep this up  You need 2 weeks of antibiotics for chronic sinusitis and acute bacterial bronchitis,  CXR today, reconsider the sinus CT scan, I believe that you need this  Neb treatment, Rocephin 500mg  iM and depo medrol 80mg  Im in the office today  Start new for symbicort daily, and spiriva daily, albuterol MDI for daily use over the next 2 weeks based on symptoms over the weekend  You are being diagniosed also with COPD flare, and  you need lung function testing  Based on discussion , you will need to resume cholesterol medication  Lipid, cmp and EGFr, hBA1C and CBc and diff today please

## 2013-11-18 NOTE — Progress Notes (Signed)
Subjective:    Patient ID: Alicia Neal, female    DOB: 03/19/1961, 53 y.o.   MRN: [id number]  HPI  4 week h/o head a congestion with yellow green nasal drainage anterior and posterior started early December and finally cleared by end December. From Jan.5 to present cough, chest congestion, temp of 100.2 no sleep for 1 week due to cough, sputum is thick tenacious , white , has been hunks of green Has had episodes of inability to breathe over the past weekend, felt as if she was unable to breathe, woke her out sleep Awoke from sleep with tightness in her chest, not even with an associated cough. Smoking steadily since age 1; 1 and up to 1.5 PPD  Chronic health issues are also addressed and updated  Review of Systems See HPI . Denies chest pains, palpitations and leg swelling Denies abdominal pain, nausea, vomiting,diarrhea or constipation.   Denies dysuria, frequency, hesitancy or incontinence. Denies joint pain, swelling and limitation in mobility. Denies headaches, seizures, numbness, or tingling. Denies uncontrolled  depression, anxiety or insomnia. Denies skin break down or rash.        Objective:   Physical Exam BP 142/86  Pulse 84  Temp(Src) 98.7 F (37.1 C) (Oral)  Resp 16  Ht 5' 1.25" (1.556 m)  Wt 180 lb 1.9 oz (81.702 kg)  BMI 33.75 kg/m2  SpO2 97% Patient alert and oriented and in mild  cardiopulmonary distress.Ill appearing  HEENT: No facial asymmetry, EOMI, frontal and maxillary  sinus tenderness,  oropharynx pink and moist.  Neck supple, bilateral anterior cervical  Adenopathy.TM clear bilaterally  Chest: decreased air entry throughout , scattered crackles and wheezes  CVS: S1, S2 no murmurs, no S3.  ABD: Soft non tender. Bowel sounds normal.  Ext: No edema  MS: Adequate ROM spine, shoulders, hips and knees.  Skin: Intact, no ulcerations or rash noted.  Psych: Good eye contact, normal affect. Memory intact not anxious or depressed  appearing.  CNS: CN 2-12 intact, power, tone and sensation normal throughout.        Assessment & Plan:  Acute bronchitis, bacterial Antibiotics and decongestants prescribed medication also administered at office visit.  Pt was unable to tolerate neb treatment  Chronic recurrent sinusitis Antibiotics and decongestants prescribed medication also administered at office visit.  Needs sinus scan based on symptom report, holding oiff on this now per pt request  DM type 2, goal A1c below 7 Deteriorated. Patient advised to reduce carb and sweets, commit to regular physical activity, take meds as prescribed, test blood as directed, and attempt to lose weight, to improve blood sugar control. Updated lab needed at/ before next visit.   ANXIETY DISORDER Controlled, no change in medication   HYPERLIPIDEMIA Uncontrolled due to dietary and med non compliance, pt to address both Hyperlipidemia:Low fat diet discussed and encouraged.  Updated lab needed at/ before next visit.   OBESITY Deteriorated. Patient re-educated about  the importance of commitment to a  minimum of 150 minutes of exercise per week. The importance of healthy food choices with portion control discussed. Encouraged to start a food diary, count calories and to consider  joining a support group. Sample diet sheets offered. Goals set by the patient for the next several months.     HTN, goal below 130/80 Uncontrolled and not at goal. Pt has been using oTC decongestants and did not take med on day of visit as yet No med change today , will readdress at f/u DASH  diet and commitment to daily physical activity for a minimum of 30 minutes discussed and encouraged, as a part of hypertension management. The importance of attaining a healthy weight is also discussed. aed on history, she DOES need higher med dose , resistant however

## 2013-11-19 LAB — LIPID PANEL
CHOL/HDL RATIO: 5.4 ratio
CHOLESTEROL: 220 mg/dL — AB (ref 0–200)
HDL: 41 mg/dL (ref >39)
LDL Cholesterol: 155 mg/dL — ABNORMAL HIGH (ref 0–99)
TRIGLYCERIDES: 119 mg/dL (ref <150)
VLDL: 24 mg/dL (ref 0–40)

## 2013-11-19 LAB — COMPLETE METABOLIC PANEL WITH GFR
ALK PHOS: 69 U/L (ref 39–117)
ALT: 26 U/L (ref 0–35)
AST: 18 U/L (ref 0–37)
Albumin: 4.6 g/dL (ref 3.5–5.2)
BILIRUBIN TOTAL: 0.3 mg/dL (ref 0.3–1.2)
BUN: 12 mg/dL (ref 6–23)
CO2: 23 mEq/L (ref 19–32)
CREATININE: 0.55 mg/dL (ref 0.50–1.10)
Calcium: 9.3 mg/dL (ref 8.4–10.5)
Chloride: 101 mEq/L (ref 96–112)
GFR, Est African American: >89 mL/min
GLUCOSE: 102 mg/dL — AB (ref 70–99)
Potassium: 4.1 mEq/L (ref 3.5–5.3)
Sodium: 138 mEq/L (ref 135–145)
Total Protein: 6.9 g/dL (ref 6.0–8.3)

## 2013-11-19 LAB — HEMOGLOBIN A1C
Hgb A1c MFr Bld: 7.1 % — ABNORMAL HIGH (ref <5.7)
Mean Plasma Glucose: 157 mg/dL — ABNORMAL HIGH (ref <117)

## 2013-11-20 MED ORDER — CANAGLIFLOZIN 300 MG PO TABS: ORAL_TABLET | ORAL | Status: DC

## 2013-11-27 ENCOUNTER — Other Ambulatory Visit: Payer: Self-pay | Admitting: Family Medicine

## 2013-11-28 ENCOUNTER — Ambulatory Visit (HOSPITAL_COMMUNITY): Admission: RE | Admit: 2013-11-28 | Payer: 59 | Source: Ambulatory Visit

## 2013-12-15 ENCOUNTER — Encounter: Payer: Self-pay | Admitting: Family Medicine

## 2013-12-15 NOTE — Assessment & Plan Note (Signed)
Uncontrolled due to dietary and med non compliance, pt to address both Hyperlipidemia:Low fat diet discussed and encouraged.  Updated lab needed at/ before next visit.

## 2013-12-15 NOTE — Assessment & Plan Note (Signed)
Deteriorated. Patient re-educated about  the importance of commitment to a  minimum of 150 minutes of exercise per week. The importance of healthy food choices with portion control discussed. Encouraged to start a food diary, count calories and to consider  joining a support group. Sample diet sheets offered. Goals set by the patient for the next several months.    

## 2013-12-15 NOTE — Assessment & Plan Note (Signed)
Antibiotics and decongestants prescribed medication also administered at office visit.  Needs sinus scan based on symptom report, holding oiff on this now per pt request

## 2013-12-15 NOTE — Assessment & Plan Note (Signed)
Uncontrolled and not at goal. Pt has been using oTC decongestants and did not take med on day of visit as yet No med change today , will readdress at f/u DASH diet and commitment to daily physical activity for a minimum of 30 minutes discussed and encouraged, as a part of hypertension management. The importance of attaining a healthy weight is also discussed. aed on history, she DOES need higher med dose , resistant however

## 2013-12-15 NOTE — Assessment & Plan Note (Signed)
Controlled, no change in medication  

## 2013-12-15 NOTE — Assessment & Plan Note (Signed)
Antibiotics and decongestants prescribed medication also administered at office visit.  Pt was unable to tolerate neb treatment

## 2013-12-15 NOTE — Assessment & Plan Note (Signed)
Deteriorated Patient advised to reduce carb and sweets, commit to regular physical activity, take meds as prescribed, test blood as directed, and attempt to lose weight, to improve blood sugar control. Updated lab needed at/ before next visit.  

## 2014-02-20 ENCOUNTER — Other Ambulatory Visit: Payer: Self-pay | Admitting: Family Medicine

## 2014-02-20 DIAGNOSIS — Z1231 Encounter for screening mammogram for malignant neoplasm of breast: Secondary | ICD-10-CM

## 2014-02-24 ENCOUNTER — Ambulatory Visit (HOSPITAL_COMMUNITY): Payer: 59

## 2014-02-24 ENCOUNTER — Other Ambulatory Visit: Payer: Self-pay | Admitting: Family Medicine

## 2014-02-24 LAB — LIPID PANEL
CHOL/HDL RATIO: 4.7 ratio
CHOLESTEROL: 232 mg/dL — AB (ref 0–200)
HDL: 49 mg/dL (ref >39)
LDL Cholesterol: 159 mg/dL — ABNORMAL HIGH (ref 0–99)
Triglycerides: 119 mg/dL (ref <150)
VLDL: 24 mg/dL (ref 0–40)

## 2014-02-24 LAB — COMPLETE METABOLIC PANEL WITH GFR
ALK PHOS: 57 U/L (ref 39–117)
ALT: 20 U/L (ref 0–35)
AST: 14 U/L (ref 0–37)
Albumin: 4.2 g/dL (ref 3.5–5.2)
BUN: 12 mg/dL (ref 6–23)
CO2: 28 mEq/L (ref 19–32)
Calcium: 9.3 mg/dL (ref 8.4–10.5)
Chloride: 102 mEq/L (ref 96–112)
Creat: 0.59 mg/dL (ref 0.50–1.10)
GFR, Est African American: >89 mL/min
Glucose, Bld: 132 mg/dL — ABNORMAL HIGH (ref 70–99)
Potassium: 4.2 mEq/L (ref 3.5–5.3)
SODIUM: 138 meq/L (ref 135–145)
Total Bilirubin: 0.4 mg/dL (ref 0.2–1.2)
Total Protein: 6.4 g/dL (ref 6.0–8.3)

## 2014-02-24 LAB — HEMOGLOBIN A1C
Hgb A1c MFr Bld: 6.7 % — ABNORMAL HIGH (ref <5.7)
Mean Plasma Glucose: 146 mg/dL — ABNORMAL HIGH (ref <117)

## 2014-02-27 ENCOUNTER — Other Ambulatory Visit: Payer: Self-pay | Admitting: Family Medicine

## 2014-03-03 ENCOUNTER — Encounter: Payer: Self-pay | Admitting: Family Medicine

## 2014-03-03 ENCOUNTER — Ambulatory Visit (INDEPENDENT_AMBULATORY_CARE_PROVIDER_SITE_OTHER): Payer: 59 | Admitting: Family Medicine

## 2014-03-03 ENCOUNTER — Ambulatory Visit (HOSPITAL_COMMUNITY)
Admission: RE | Admit: 2014-03-03 | Discharge: 2014-03-03 | Disposition: A | Discharge status: Home / Self Care | Payer: 59 | Source: Ambulatory Visit | Attending: Family Medicine | Admitting: Family Medicine

## 2014-03-03 VITALS — BP 144/82 | HR 70 | Resp 16 | Ht 61.25 in | Wt 181.0 lb

## 2014-03-03 DIAGNOSIS — F329 Major depressive disorder, single episode, unspecified: Secondary | ICD-10-CM

## 2014-03-03 DIAGNOSIS — E278 Other specified disorders of adrenal gland: Secondary | ICD-10-CM

## 2014-03-03 DIAGNOSIS — L121 Cicatricial pemphigoid: Secondary | ICD-10-CM

## 2014-03-03 DIAGNOSIS — J301 Allergic rhinitis due to pollen: Secondary | ICD-10-CM

## 2014-03-03 DIAGNOSIS — F32A Depression, unspecified: Secondary | ICD-10-CM

## 2014-03-03 DIAGNOSIS — J449 Chronic obstructive pulmonary disease, unspecified: Secondary | ICD-10-CM | POA: Insufficient documentation

## 2014-03-03 DIAGNOSIS — E119 Type 2 diabetes mellitus without complications: Secondary | ICD-10-CM

## 2014-03-03 DIAGNOSIS — G4700 Insomnia, unspecified: Secondary | ICD-10-CM | POA: Insufficient documentation

## 2014-03-03 DIAGNOSIS — E669 Obesity, unspecified: Secondary | ICD-10-CM

## 2014-03-03 DIAGNOSIS — K219 Gastro-esophageal reflux disease without esophagitis: Secondary | ICD-10-CM

## 2014-03-03 DIAGNOSIS — I10 Essential (primary) hypertension: Secondary | ICD-10-CM

## 2014-03-03 DIAGNOSIS — E279 Disorder of adrenal gland, unspecified: Secondary | ICD-10-CM

## 2014-03-03 DIAGNOSIS — J4489 Other specified chronic obstructive pulmonary disease: Secondary | ICD-10-CM | POA: Insufficient documentation

## 2014-03-03 DIAGNOSIS — E559 Vitamin D deficiency, unspecified: Secondary | ICD-10-CM

## 2014-03-03 DIAGNOSIS — E785 Hyperlipidemia, unspecified: Secondary | ICD-10-CM

## 2014-03-03 DIAGNOSIS — F3289 Other specified depressive episodes: Secondary | ICD-10-CM

## 2014-03-03 DIAGNOSIS — F17200 Nicotine dependence, unspecified, uncomplicated: Secondary | ICD-10-CM

## 2014-03-03 DIAGNOSIS — D3500 Benign neoplasm of unspecified adrenal gland: Secondary | ICD-10-CM | POA: Insufficient documentation

## 2014-03-03 MED ORDER — PANTOPRAZOLE SODIUM 40 MG PO TBEC: 40.0000 mg | DELAYED_RELEASE_TABLET | Freq: Every day | ORAL | Status: DC

## 2014-03-03 MED ORDER — PRAVASTATIN SODIUM 40 MG PO TABS: 40.0000 mg | ORAL_TABLET | Freq: Every day | ORAL | Status: DC

## 2014-03-03 MED ORDER — MIRTAZAPINE 15 MG PO TABS: 15.0000 mg | ORAL_TABLET | Freq: Every day | ORAL | Status: DC

## 2014-03-03 NOTE — Patient Instructions (Addendum)
F/u in 3.5 month, depression screen, and pneumonia vaccine for next visit, and foot exam  Reduction in citalopram dose will start at next visit , once we have your depression screen completed and documented  Start aspirin $RemoveBefor'81mg'[id number]$  one daily to reduce stroke risk  Start calcium 1000 to 1200 mg daily for bone health  (OK to caol for this at the pharmacy please get it combined with vitamin D)  Continue weekly vitamin D supplement   New meds protonix, pravachol and remron, correction made to restoril Need mammogram , please schedule and keep appt  Referral for chest CT scan  F/u adrenal nodule ,      Blood pressure high today, work on regular exercise and weight loss, will need increase in dose of medication if not better  Labs for next visit, microalb, fasting lipid, cmp and EGFr, hBA1C , Vit D, CBC and TSH, fasting  CONGRATS on smoking cessation

## 2014-03-04 ENCOUNTER — Ambulatory Visit (HOSPITAL_COMMUNITY): Payer: 59

## 2014-03-04 ENCOUNTER — Ambulatory Visit (HOSPITAL_COMMUNITY)
Admission: RE | Admit: 2014-03-04 | Discharge: 2014-03-04 | Disposition: A | Discharge status: Home / Self Care | Payer: 59 | Source: Ambulatory Visit | Attending: Family Medicine | Admitting: Family Medicine

## 2014-03-04 DIAGNOSIS — Z1231 Encounter for screening mammogram for malignant neoplasm of breast: Secondary | ICD-10-CM | POA: Insufficient documentation

## 2014-03-05 ENCOUNTER — Ambulatory Visit (HOSPITAL_COMMUNITY): Admission: RE | Admit: 2014-03-05 | Payer: 59 | Source: Ambulatory Visit

## 2014-03-07 ENCOUNTER — Encounter: Payer: Self-pay | Admitting: Family Medicine

## 2014-03-07 ENCOUNTER — Other Ambulatory Visit: Payer: Self-pay | Admitting: Family Medicine

## 2014-03-07 DIAGNOSIS — Z87891 Personal history of nicotine dependence: Secondary | ICD-10-CM

## 2014-03-07 DIAGNOSIS — R918 Other nonspecific abnormal finding of lung field: Secondary | ICD-10-CM

## 2014-03-07 DIAGNOSIS — Z801 Family history of malignant neoplasm of trachea, bronchus and lung: Secondary | ICD-10-CM

## 2014-03-07 MED ORDER — TEMAZEPAM 7.5 MG PO CAPS: 7.5000 mg | ORAL_CAPSULE | Freq: Every evening | ORAL | Status: DC | PRN

## 2014-03-09 NOTE — Assessment & Plan Note (Signed)
Unchnaged, has not been taking statin states waa trying to "do on her own" Full benefit of statin in cv risk reduction stressed, states she will start med as prescribed Hyperlipidemia:Low fat diet discussed and encouraged.  Updated lab needed at/ before next visit.

## 2014-03-09 NOTE — Assessment & Plan Note (Signed)
remainjs quit. Positive f/h of lung cancer, pt has over 30 pack year history will obtain spiral chest scan  She is applauded on the fact that she has now been successful in smoking cessation

## 2014-03-09 NOTE — Assessment & Plan Note (Signed)
Currently stable, jot on daily medication currently

## 2014-03-09 NOTE — Assessment & Plan Note (Signed)
Intermittent flares occur followed by dermatology at [facility]

## 2014-03-09 NOTE — Assessment & Plan Note (Signed)
Not at goal pt resistant to increase in med dose at this time DASH diet and commitment to daily physical activity for a minimum of 30 minutes discussed and encouraged, as a part of hypertension management. The importance of attaining a healthy weight is also discussed.

## 2014-03-09 NOTE — Progress Notes (Signed)
Subjective:    Patient ID: Alicia Neal, female    DOB: October 17, 1961, 53 y.o.   MRN: [id number]  HPI The PT is here for follow up and re-evaluation of chronic medical conditions, medication management and review of any available recent lab and radiology data.  Preventive health is updated, specifically  Cancer screening and Immunization.  Needs mammogram Recently had pelvic and breast exam.The PT denies any adverse reactions to current medications since the last visit. Elected to take neither invokana or statin, blood suagr better and lipids are elevated and unchanged Wants re evaluation of chest scan and adrenal nodule, f/h of lung cancer and personal h/o over 30 pack year smoking, fortunately, she is now nicotine free    Review of Systems See HPI Denies recent fever or chills. Denies sinus pressure, nasal congestion, ear pain or sore throat. Denies chest congestion, productive cough or wheezing. Denies chest pains, palpitations and leg swelling Denies abdominal pain, nausea, vomiting,diarrhea or constipation.   Denies dysuria, frequency, hesitancy or incontinence. Denies joint pain, swelling and limitation in mobility. Denies headaches, seizures, numbness, or tingling. Denies depression, anxiety or insomnia.Wants to d/c antidepressant but requests help wit sleep, this is not as new problem but is worsening. Intermittent flares stil occur of her skin disease but not to a disabling extent        Objective:   Physical Exam  BP 144/82  Pulse 70  Resp 16  Ht 5' 1.25" (1.556 m)  Wt 181 lb (82.101 kg)  BMI 33.91 kg/m2  SpO2 98% Patient alert and oriented and in no cardiopulmonary distress.  HEENT: No facial asymmetry, EOMI, no sinus tenderness,  oropharynx pink and moist.  Neck supple no adenopathy.  Chest: Clear to auscultation bilaterally.  CVS: S1, S2 no murmurs, no S3.  ABD: Soft non tender. Bowel sounds normal.  Ext: No edema  MS: Adequate ROM spine, shoulders,  hips and knees.  Skin: Intact, no ulcerations or rash noted.  Psych: Good eye contact, normal affect. Memory intact not anxious or depressed appearing.  CNS: CN 2-12 intact, power, tone and sensation normal throughout.       Assessment & Plan:  HTN, goal below 130/80 Not at goal pt resistant to increase in med dose at this time DASH diet and commitment to daily physical activity for a minimum of 30 minutes discussed and encouraged, as a part of hypertension management. The importance of attaining a healthy weight is also discussed.   ALLERGIC RHINITIS, SEASONAL Currently stable, jot on daily medication currently  DM type 2, goal A1c below 7 Improved, she is applauded on this Patient advised to reduce carb and sweets, commit to regular physical activity, take meds as prescribed, test blood as directed, and attempt to lose weight, to improve blood sugar control.   Adrenal nodule F/u imaging needed  HYPERLIPIDEMIA Unchnaged, has not been taking statin states waa trying to "do on her own" Full benefit of statin in cv risk reduction stressed, states she will start med as prescribed Hyperlipidemia:Low fat diet discussed and encouraged.  Updated lab needed at/ before next visit.   OBESITY Unchanged Patient re-educated about  the importance of commitment to a  minimum of 150 minutes of exercise per week. The importance of healthy food choices with portion control discussed. Encouraged to start a food diary, count calories and to consider  joining a support group. Sample diet sheets offered. Goals set by the patient for the next several months.     H/O  tobacco use, presenting hazards to health remainjs quit. Positive f/h of lung cancer, pt has over 30 pack year history will obtain spiral chest scan  She is applauded on the fact that she has now been successful in smoking cessation  Insomnia Sleep hygiene reviewed and written information offered also. Prescription sent for   medication needed.   Depression Improving , requests reduction in citalopram dose , wants to eventually d/c totally will address at next visit. She is encouraged to commit to daily exercise as a means of stress and depression reduction  Pemphigoid, cicatricial Intermittent flares occur followed by dermatology at [facility]  GERD (gastroesophageal reflux disease) Uncontrolled needs daily PPI, also reduce caffeine intake

## 2014-03-09 NOTE — Assessment & Plan Note (Signed)
Sleep hygiene reviewed and written information offered also. Prescription sent for  medication needed.  

## 2014-03-09 NOTE — Assessment & Plan Note (Signed)
F/u imaging needed

## 2014-03-09 NOTE — Assessment & Plan Note (Signed)
Improved, she is applauded on this Patient advised to reduce carb and sweets, commit to regular physical activity, take meds as prescribed, test blood as directed, and attempt to lose weight, to improve blood sugar control.

## 2014-03-09 NOTE — Assessment & Plan Note (Addendum)
Uncontrolled needs daily PPI, also reduce caffeine intake

## 2014-03-09 NOTE — Assessment & Plan Note (Signed)
Improving , requests reduction in citalopram dose , wants to eventually d/c totally will address at next visit. She is encouraged to commit to daily exercise as a means of stress and depression reduction

## 2014-03-09 NOTE — Assessment & Plan Note (Signed)
Unchanged. Patient re-educated about  the importance of commitment to a  minimum of 150 minutes of exercise per week. The importance of healthy food choices with portion control discussed. Encouraged to start a food diary, count calories and to consider  joining a support group. Sample diet sheets offered. Goals set by the patient for the next several months.    

## 2014-03-18 ENCOUNTER — Ambulatory Visit (INDEPENDENT_AMBULATORY_CARE_PROVIDER_SITE_OTHER): Payer: 59 | Admitting: Pulmonary Disease

## 2014-03-18 ENCOUNTER — Encounter: Payer: Self-pay | Admitting: Pulmonary Disease

## 2014-03-18 VITALS — BP 142/86 | HR 61 | Ht 62.0 in | Wt 182.0 lb

## 2014-03-18 DIAGNOSIS — R918 Other nonspecific abnormal finding of lung field: Secondary | ICD-10-CM | POA: Insufficient documentation

## 2014-03-18 DIAGNOSIS — Z87891 Personal history of nicotine dependence: Secondary | ICD-10-CM

## 2014-03-18 DIAGNOSIS — R0602 Shortness of breath: Secondary | ICD-10-CM

## 2014-03-18 NOTE — Progress Notes (Signed)
Subjective:    Patient ID: Alicia Neal, female    DOB: 08/08/1961, 53 y.o.   MRN: [id number]  HPI  This is a very pleasant 53 year old female who comes her clinic today for evaluation of multiple pulmonary nodules which are seen on a CT scan. She has smoked up to one pack of cigarettes daily for 39 years and quit on 11/07/2013. She is able to quit cold [place] and she has not started smoking since then. She has CT scan performed initially in 2013 as a screening CT because of her smoking history. Apparently that showed some 1-2 mm nodules. She had a repeat CT scan performed this year and that showed no change in the 1-2 mm nodules but there was some emphysema.  She has not had weight loss, shortness of breath, chest pain, or hemoptysis.  She does have a family history significant for lung can great uncle died at age 41 from lung cancer. Both were smokers.  She denies any significant shortness of breath on exertion. She does have some allergies this time year. She does not have a personal history of malignancy. No weight loss, chest pain and malignancy   Past Medical History  Diagnosis Date  . Hypertension   . Hyperlipidemia   . Tobacco abuse   . Obesity     BMI 35.1  . Adenomatous polyps 10/2009    of the colon  . Allergic rhinitis   . Anxiety disorder   . Chronic depression     Dr. Cheryln Manly  . Pemphigoid, cicatricial 2012  . Abnormal Pap smear   . Diabetes mellitus without complication      Family History  Problem Relation Age of Onset  . Lung cancer Father   . Diabetes Father   . Cancer Father     lung  . Arthritis Mother   . Cancer Brother     skin  . Cancer Maternal Grandmother     colon and vaginal  . Cancer Maternal Grandfather     colon  . COPD Maternal Grandfather   . Hypertension Paternal Grandmother   . Stroke Paternal Grandmother      History   Social History  . Marital Status: Legally Separated    Spouse Name: N/A    Number of Children: 1  .  Years of Education: N/A   Occupational History  . Not on file.   Social History Main Topics  . Smoking status: Former Smoker -- 1.00 packs/day for 40 years    Types: Cigarettes    Quit date: 11/07/2013  . Smokeless tobacco: Not on file  . Alcohol Use: No  . Drug Use: No  . Sexual Activity: No   Other Topics Concern  . Not on file   Social History Narrative  . No narrative on file     Allergies  Allergen Reactions  . Ezetimibe-Simvastatin     REACTION: muscle aches  . Simvastatin     REACTION: muscle aches     Outpatient Prescriptions Prior to Visit  Medication Sig Dispense Refill  . albuterol (PROVENTIL HFA;VENTOLIN HFA) 108 (90 BASE) MCG/ACT inhaler Inhale 2 puffs into the lungs every 6 (six) hours as needed for wheezing or shortness of breath.  1 Inhaler  2  . citalopram (CELEXA) 40 MG tablet TAKE 1 TABLET BY MOUTH ONCE DAILY  90 tablet  2  . JANUMET 50-1000 MG per tablet TAKE 1 TABLET BY MOUTH 2 TIMES DAILY WITH A MEAL.  180 tablet  PRN  .  losartan (COZAAR) 50 MG tablet TAKE 1 TABLET BY MOUTH DAILY  90 tablet  3  . montelukast (SINGULAIR) 10 MG tablet TAKE 1 TABLET (10 MG TOTAL) BY MOUTH AT BEDTIME.  90 tablet  3  . mycophenolate (CELLCEPT) 500 MG tablet 3 tabs twice a day      . pantoprazole (PROTONIX) 40 MG tablet Take 1 tablet (40 mg total) by mouth daily.  90 tablet  3  . pravastatin (PRAVACHOL) 40 MG tablet Take 1 tablet (40 mg total) by mouth daily.  90 tablet  3  . temazepam (RESTORIL) 7.5 MG capsule Take 1 capsule (7.5 mg total) by mouth at bedtime as needed for sleep.  30 capsule  4  . Vitamin D, Ergocalciferol, (DRISDOL) 50000 UNITS CAPS capsule TAKE 1 CAPSULE BY MOUTH ONCE A WEEK.  12 capsule  PRN  . fluticasone (FLONASE) 50 MCG/ACT nasal spray Place 2 sprays into the nose daily.  16 g  3   No facility-administered medications prior to visit.      Review of Systems  Constitutional: Negative for fever and unexpected weight change.  HENT: Negative for  congestion, dental problem, ear pain, nosebleeds, postnasal drip, rhinorrhea, sinus pressure, sneezing, sore throat and trouble swallowing.   Eyes: Negative for redness and itching.  Respiratory: Negative for cough, chest tightness, shortness of breath and wheezing.   Cardiovascular: Negative for palpitations and leg swelling.  Gastrointestinal: Negative for nausea and vomiting.  Genitourinary: Negative for dysuria.  Musculoskeletal: Negative for joint swelling.  Skin: Negative for rash.  Neurological: Negative for headaches.  Hematological: Does not bruise/bleed easily.  Psychiatric/Behavioral: Negative for dysphoric mood. The patient is not nervous/anxious.        Objective:   Physical Exam Filed Vitals:   03/18/14 1040  BP: 142/86  Pulse: 61  Height: 5\' 2"  (1.575 m)  Weight: 182 lb (82.555 kg)  SpO2: 99%   Gen: well appearing, no acute distress HEENT: NCAT, PERRL, EOMi, OP clear, neck supple without masses PULM: CTA B CV: RRR, no mgr, no JVD AB: BS+, soft, nontender, no hsm Ext: warm, no edema, no clubbing, no cyanosis Derm: no rash or skin breakdown Neuro: A&Ox4, CN II-XII intact, strength 5/5 in all 4 extremities   03/18/2014 simple spirometry normal     Assessment & Plan:   H/O tobacco use, presenting hazards to health She quit smoking in January 2015. Because her overall smoking history is consistent with greater than 30-pack-year history and she is age 43 she does qualify for low dose CT screening.  Today we performed simple spirometry clinic and it was completely normal.  She does not have COPD.  Plan: -Repeat low dose CTs screening for lung cancer in 2016  Pulmonary nodules The lung nodule seen on the 2015 CT scan were tiny. They were 1-2 mm in size which considering the fact that they have not grown in 2 years would normally not be followed further.  However, because she has smoked more than 30 pack years and she is age greater than 20 she will need repeat  CT scans on a screening basis for the next 15 years.  Plan: -Repeat low dose lung cancer screening CT scan in 2016   Updated Medication List Outpatient Encounter Prescriptions as of 03/18/2014  Medication Sig  . albuterol (PROVENTIL HFA;VENTOLIN HFA) 108 (90 BASE) MCG/ACT inhaler Inhale 2 puffs into the lungs every 6 (six) hours as needed for wheezing or shortness of breath.  . citalopram (CELEXA) 40 MG tablet  TAKE 1 TABLET BY MOUTH ONCE DAILY  . JANUMET 50-1000 MG per tablet TAKE 1 TABLET BY MOUTH 2 TIMES DAILY WITH A MEAL.  Marland Kitchen losartan (COZAAR) 50 MG tablet TAKE 1 TABLET BY MOUTH DAILY  . montelukast (SINGULAIR) 10 MG tablet TAKE 1 TABLET (10 MG TOTAL) BY MOUTH AT BEDTIME.  . mycophenolate (CELLCEPT) 500 MG tablet 3 tabs twice a day  . pantoprazole (PROTONIX) 40 MG tablet Take 1 tablet (40 mg total) by mouth daily.  . pravastatin (PRAVACHOL) 40 MG tablet Take 1 tablet (40 mg total) by mouth daily.  . temazepam (RESTORIL) 7.5 MG capsule Take 1 capsule (7.5 mg total) by mouth at bedtime as needed for sleep.  . Vitamin D, Ergocalciferol, (DRISDOL) 50000 UNITS CAPS capsule TAKE 1 CAPSULE BY MOUTH ONCE A WEEK.  . fluticasone (FLONASE) 50 MCG/ACT nasal spray Place 2 sprays into the nose daily.

## 2014-03-18 NOTE — Assessment & Plan Note (Signed)
The lung nodule seen on the 2015 CT scan were tiny. They were 1-2 mm in size which considering the fact that they have not grown in 2 years would normally not be followed further.  However, because she has smoked more than 30 pack years and she is age greater than 23 she will need repeat CT scans on a screening basis for the next 15 years.  Plan: -Repeat low dose lung cancer screening CT scan in 2016

## 2014-03-18 NOTE — Patient Instructions (Signed)
You don't have COPD You need annual CT scans of the chest (Low Dose CT Scan of the chest) through 2030

## 2014-03-18 NOTE — Assessment & Plan Note (Signed)
She quit smoking in January 2015. Because her overall smoking history is consistent with greater than 30-pack-year history and she is age 53 she does qualify for low dose CT screening.  Today we performed simple spirometry clinic and it was completely normal.  She does not have COPD.  Plan: -Repeat low dose CTs screening for lung cancer in 2016

## 2014-03-19 ENCOUNTER — Institutional Professional Consult (permissible substitution): Payer: 59 | Admitting: Pulmonary Disease

## 2014-03-24 ENCOUNTER — Other Ambulatory Visit: Payer: Self-pay | Admitting: Family Medicine

## 2014-03-24 MED ORDER — EZETIMIBE 10 MG PO TABS: 10.0000 mg | ORAL_TABLET | Freq: Every day | ORAL | Status: DC

## 2014-06-02 ENCOUNTER — Other Ambulatory Visit: Payer: Self-pay | Admitting: Family Medicine

## 2014-06-23 LAB — CBC WITH DIFFERENTIAL/PLATELET
BASOS ABS: 0.1 10*3/uL (ref 0.0–0.1)
Basophils Relative: 1 % (ref 0–1)
EOS PCT: 3 % (ref 0–5)
Eosinophils Absolute: 0.2 10*3/uL (ref 0.0–0.7)
HCT: 37.7 % (ref 36.0–46.0)
Hemoglobin: 12.3 g/dL (ref 12.0–15.0)
Lymphocytes Relative: 17 % (ref 12–46)
Lymphs Abs: 1.3 10*3/uL (ref 0.7–4.0)
MCH: 28.3 pg (ref 26.0–34.0)
MCHC: 32.6 g/dL (ref 30.0–36.0)
MCV: 86.9 fL (ref 78.0–100.0)
Monocytes Absolute: 0.6 10*3/uL (ref 0.1–1.0)
Monocytes Relative: 8 % (ref 3–12)
Neutro Abs: 5.5 10*3/uL (ref 1.7–7.7)
Neutrophils Relative %: 71 % (ref 43–77)
Platelets: 396 10*3/uL (ref 150–400)
RBC: 4.34 MIL/uL (ref 3.87–5.11)
RDW: 14.2 % (ref 11.5–15.5)
WBC: 7.7 10*3/uL (ref 4.0–10.5)

## 2014-06-24 LAB — COMPLETE METABOLIC PANEL WITH GFR
ALT: 19 U/L (ref 0–35)
AST: 13 U/L (ref 0–37)
Albumin: 4.5 g/dL (ref 3.5–5.2)
Alkaline Phosphatase: 56 U/L (ref 39–117)
BUN: 15 mg/dL (ref 6–23)
CO2: 27 mEq/L (ref 19–32)
CREATININE: 0.59 mg/dL (ref 0.50–1.10)
Calcium: 9.1 mg/dL (ref 8.4–10.5)
Chloride: 101 mEq/L (ref 96–112)
GFR, Est African American: >89 mL/min
GLUCOSE: 126 mg/dL — AB (ref 70–99)
Potassium: 4.2 mEq/L (ref 3.5–5.3)
Sodium: 138 mEq/L (ref 135–145)
Total Bilirubin: 0.3 mg/dL (ref 0.2–1.2)
Total Protein: 6.4 g/dL (ref 6.0–8.3)

## 2014-06-24 LAB — HEMOGLOBIN A1C
Hgb A1c MFr Bld: 6.6 % — ABNORMAL HIGH (ref <5.7)
Mean Plasma Glucose: 143 mg/dL — ABNORMAL HIGH (ref <117)

## 2014-06-24 LAB — LIPID PANEL
CHOLESTEROL: 192 mg/dL (ref 0–200)
HDL: 61 mg/dL (ref >39)
LDL Cholesterol: 113 mg/dL — ABNORMAL HIGH (ref 0–99)
TRIGLYCERIDES: 91 mg/dL (ref <150)
Total CHOL/HDL Ratio: 3.1 Ratio
VLDL: 18 mg/dL (ref 0–40)

## 2014-06-24 LAB — MICROALBUMIN / CREATININE URINE RATIO
Creatinine, Urine: 133.9 mg/dL
MICROALB/CREAT RATIO: 6.9 mg/g (ref 0.0–30.0)
Microalb, Ur: 0.93 mg/dL (ref 0.00–1.89)

## 2014-06-24 LAB — VITAMIN D 25 HYDROXY (VIT D DEFICIENCY, FRACTURES): Vit D, 25-Hydroxy: 57 ng/mL (ref 30–89)

## 2014-06-24 LAB — TSH: TSH: 1.739 u[IU]/mL (ref 0.350–4.500)

## 2014-06-25 ENCOUNTER — Encounter: Payer: Self-pay | Admitting: Family Medicine

## 2014-06-25 ENCOUNTER — Ambulatory Visit (INDEPENDENT_AMBULATORY_CARE_PROVIDER_SITE_OTHER): Payer: 59 | Admitting: Family Medicine

## 2014-06-25 VITALS — BP 128/82 | HR 70 | Resp 16 | Ht 62.0 in | Wt 178.0 lb

## 2014-06-25 DIAGNOSIS — E785 Hyperlipidemia, unspecified: Secondary | ICD-10-CM

## 2014-06-25 DIAGNOSIS — E669 Obesity, unspecified: Secondary | ICD-10-CM

## 2014-06-25 DIAGNOSIS — B351 Tinea unguium: Secondary | ICD-10-CM

## 2014-06-25 DIAGNOSIS — I10 Essential (primary) hypertension: Secondary | ICD-10-CM

## 2014-06-25 DIAGNOSIS — Z23 Encounter for immunization: Secondary | ICD-10-CM | POA: Insufficient documentation

## 2014-06-25 DIAGNOSIS — G4700 Insomnia, unspecified: Secondary | ICD-10-CM

## 2014-06-25 DIAGNOSIS — R918 Other nonspecific abnormal finding of lung field: Secondary | ICD-10-CM

## 2014-06-25 DIAGNOSIS — F329 Major depressive disorder, single episode, unspecified: Secondary | ICD-10-CM

## 2014-06-25 DIAGNOSIS — E119 Type 2 diabetes mellitus without complications: Secondary | ICD-10-CM

## 2014-06-25 DIAGNOSIS — L121 Cicatricial pemphigoid: Secondary | ICD-10-CM

## 2014-06-25 DIAGNOSIS — F3289 Other specified depressive episodes: Secondary | ICD-10-CM

## 2014-06-25 DIAGNOSIS — Z87891 Personal history of nicotine dependence: Secondary | ICD-10-CM

## 2014-06-25 DIAGNOSIS — F32A Depression, unspecified: Secondary | ICD-10-CM

## 2014-06-25 MED ORDER — TERBINAFINE HCL 250 MG PO TABS: 250.0000 mg | ORAL_TABLET | Freq: Every day | ORAL | Status: DC

## 2014-06-25 MED ORDER — CITALOPRAM HYDROBROMIDE 20 MG PO TABS: 20.0000 mg | ORAL_TABLET | Freq: Every day | ORAL | Status: DC

## 2014-06-25 NOTE — Assessment & Plan Note (Signed)
Followed in [place], continues to have intermittent flares

## 2014-06-25 NOTE — Assessment & Plan Note (Signed)
Controlled, no change in medication Improved Patient advised to reduce carb and sweets, commit to regular physical activity, take meds as prescribed, test blood as directed, and attempt to lose weight, to improve blood sugar control.

## 2014-06-25 NOTE — Assessment & Plan Note (Signed)
Administered at visit 

## 2014-06-25 NOTE — Assessment & Plan Note (Signed)
Controlled, no change in medication DASH diet and commitment to daily physical activity for a minimum of 30 minutes discussed and encouraged, as a part of hypertension management. The importance of attaining a healthy weight is also discussed.  

## 2014-06-25 NOTE — Assessment & Plan Note (Signed)
3 month course of terbinafine prescribed

## 2014-06-25 NOTE — Progress Notes (Signed)
   Subjective:    Patient ID: Alicia Neal, female    DOB: 04/06/1961, 53 y.o.   MRN: [id number]  HPI  The PT is here for follow up and re-evaluation of chronic medical conditions, medication management and review of any available recent lab and radiology data.  Preventive health is updated, specifically  Cancer screening and Immunization.   Questions or concerns regarding consultations or procedures which the PT has had in the interim are  addressed. The PT denies any adverse reactions to current medications since the last visit. Excellent result with low dose restoril, feels like a "new person"  Wants to try to reduce lexapro dose, and I applaud this idea There are no new concerns.  There are no specific complaints      Review of Systems See HPI Denies recent fever or chills. Denies sinus pressure, nasal congestion, ear pain or sore throat. Denies chest congestion, productive cough or wheezing. Denies chest pains, palpitations and leg swelling Denies abdominal pain, nausea, vomiting,diarrhea or constipation.   Denies dysuria, frequency, hesitancy or incontinence. Denies joint pain, swelling and limitation in mobility. Denies headaches, seizures, numbness, or tingling. Denies depression, anxiety or insomnia. Denies skin break down or rash.Chronic thick and crumbly toenails        Objective:   Physical Exam  BP 128/82  Pulse 70  Resp 16  Ht 5\' 2"  (1.575 m)  Wt 178 lb (80.74 kg)  BMI 32.55 kg/m2  SpO2 98% Patient alert and oriented and in no cardiopulmonary distress.  HEENT: No facial asymmetry, EOMI,   oropharynx pink and moist.  Neck supple no JVD, no mass.  Chest: Clear to auscultation bilaterally.  CVS: S1, S2 no murmurs, no S3.Regular rate.  ABD: Soft non tender.   Ext: No edema  MS: Adequate ROM spine, shoulders, hips and knees.  Skin: Intact, no ulcerations or rash noted.  Psych: Good eye contact, normal affect. Memory intact not anxious or  depressed appearing.  CNS: CN 2-12 intact, power,  normal throughout.no focal deficits noted.       Assessment & Plan:  HTN, goal below 130/80 Controlled, no change in medication DASH diet and commitment to daily physical activity for a minimum of 30 minutes discussed and encouraged, as a part of hypertension management. The importance of attaining a healthy weight is also discussed.    Depression Improved , with better sleep Reduce dose to 20mg  daily, continue exercise  Insomnia Excellent response to low dose restoril, continue same   OBESITY Improved. Pt applauded on succesful weight loss through lifestyle change, and encouraged to continue same. Weight loss goal set for the next several months.   DM type 2, goal A1c below 7 Controlled, no change in medication Improved Patient advised to reduce carb and sweets, commit to regular physical activity, take meds as prescribed, test blood as directed, and attempt to lose weight, to improve blood sugar control.   Onychomycosis 3 month course of terbinafine prescribed  Pemphigoid, cicatricial Followed in [place], continues to have intermittent flares  Need for Tdap vaccination Administered at visit  Need for vaccination with 13-polyvalent pneumococcal conjugate vaccine Administered at visit  HYPERLIPIDEMIA Improved, not at goal as yet.Pt applauded on this Hyperlipidemia:Low fat diet discussed and encouraged.  Updated lab needed at/ before next visit.

## 2014-06-25 NOTE — Assessment & Plan Note (Signed)
Improved , with better sleep Reduce dose to 20mg  daily, continue exercise

## 2014-06-25 NOTE — Assessment & Plan Note (Signed)
Improved, not at goal as yet.Pt applauded on this Hyperlipidemia:Low fat diet discussed and encouraged.  Updated lab needed at/ before next visit.

## 2014-06-25 NOTE — Assessment & Plan Note (Signed)
Improved. Pt applauded on succesful weight loss through lifestyle change, and encouraged to continue same. Weight loss goal set for the next several months.  

## 2014-06-25 NOTE — Assessment & Plan Note (Signed)
Excellent response to low dose restoril, continue same

## 2014-06-25 NOTE — Patient Instructions (Addendum)
CONGRATS, keep doing well!!  F/u end December, call if you need me before  Reduce citalopram to $RemoveBefor'20mg'AnMGWaHckOEh$  once daily  New for nails is terbinafine one daily for 3 months only   START aspirin $RemoveBefor'81mg'UxAKZeHdEToE$  once daily for stroke risk reduction  Enjoy an extra 15 mins of exercise even 3 days per week   Foot exam is good, excellent labs and blood pressure  REDUCE FAT so LDL is less than 100 (PLEASE)  Fasting lipid, cmp and EGFR, HBA1C

## 2014-09-04 ENCOUNTER — Other Ambulatory Visit: Payer: Self-pay | Admitting: Family Medicine

## 2014-09-29 ENCOUNTER — Other Ambulatory Visit: Payer: Self-pay | Admitting: Family Medicine

## 2014-09-30 ENCOUNTER — Other Ambulatory Visit: Payer: Self-pay

## 2014-09-30 MED ORDER — TEMAZEPAM 7.5 MG PO CAPS: 7.5000 mg | ORAL_CAPSULE | Freq: Every evening | ORAL | Status: DC | PRN

## 2014-11-03 LAB — COMPLETE METABOLIC PANEL WITH GFR
ALBUMIN: 4.3 g/dL (ref 3.5–5.2)
ALK PHOS: 61 U/L (ref 39–117)
ALT: 31 U/L (ref 0–35)
AST: 23 U/L (ref 0–37)
BILIRUBIN TOTAL: 0.4 mg/dL (ref 0.2–1.2)
BUN: 12 mg/dL (ref 6–23)
CO2: 29 mEq/L (ref 19–32)
Calcium: 9 mg/dL (ref 8.4–10.5)
Chloride: 100 mEq/L (ref 96–112)
Creat: 0.65 mg/dL (ref 0.50–1.10)
GFR, Est African American: >89 mL/min
GFR, Est Non African American: >89 mL/min
Glucose, Bld: 157 mg/dL — ABNORMAL HIGH (ref 70–99)
POTASSIUM: 3.9 meq/L (ref 3.5–5.3)
SODIUM: 138 meq/L (ref 135–145)
TOTAL PROTEIN: 6.3 g/dL (ref 6.0–8.3)

## 2014-11-03 LAB — LIPID PANEL
CHOL/HDL RATIO: 4.2 ratio
Cholesterol: 204 mg/dL — ABNORMAL HIGH (ref 0–200)
HDL: 49 mg/dL (ref >39)
LDL CALC: 122 mg/dL — AB (ref 0–99)
TRIGLYCERIDES: 165 mg/dL — AB (ref <150)
VLDL: 33 mg/dL (ref 0–40)

## 2014-11-03 LAB — HEMOGLOBIN A1C
HEMOGLOBIN A1C: 8.5 % — AB (ref <5.7)
Mean Plasma Glucose: 197 mg/dL — ABNORMAL HIGH (ref <117)

## 2014-11-05 ENCOUNTER — Ambulatory Visit: Payer: 59 | Admitting: Family Medicine

## 2014-11-06 ENCOUNTER — Encounter: Payer: Self-pay | Admitting: Family Medicine

## 2014-11-11 ENCOUNTER — Ambulatory Visit (INDEPENDENT_AMBULATORY_CARE_PROVIDER_SITE_OTHER): Payer: 59 | Admitting: Orthopedic Surgery

## 2014-11-11 VITALS — BP 147/89 | Ht 62.0 in | Wt 190.0 lb

## 2014-11-11 DIAGNOSIS — G5602 Carpal tunnel syndrome, left upper limb: Secondary | ICD-10-CM

## 2014-11-11 MED ORDER — GABAPENTIN 100 MG PO CAPS: 100.0000 mg | ORAL_CAPSULE | Freq: Three times a day (TID) | ORAL | Status: DC

## 2014-11-11 MED ORDER — VITAMIN B-6 100 MG PO TABS: 100.0000 mg | ORAL_TABLET | Freq: Two times a day (BID) | ORAL | Status: DC

## 2014-11-11 NOTE — Progress Notes (Signed)
Patient ID: Alicia Neal, female   DOB: 1961-04-13, 54 y.o.   MRN: [id number] Patient ID: Alicia Neal, female   DOB: [birth year], 54 y.o.   MRN: [id number]  Chief Complaint  Patient presents with  . Hand Pain    Left hand pain and numbness, no injury.    HPI Alicia Neal is a 54 y.o. female.  Who presents with severe pain numbness and tingling in the left hand and wrist. The fingers of the left hand are completely numb including the fingertips with pain in the distal forearm and wrist crease. Her pain is 10 its worse at night is unrelieved by Aleve or Advil or hydrocodone. She did note improvement while she was on prednisone for different matter. However, she is on various medications to control her diabetes and the prednisone of course makes that more difficult. She notes difficulties driving holding the phone and doing certain activities daily living involving her left hand. No previous history of problems with this extremity. She did have some mild to moderate shoulder pain which has improved over the last few weeks  Her review of systems is notable for hayfever and seasonal allergy she's had some joint pain in her knee and foot her other systems were reviewed and were normal. HPI  Review of Systems Review of Systems   Past Medical History  Diagnosis Date  . Hypertension   . Hyperlipidemia   . Tobacco abuse   . Obesity     BMI 35.1  . Adenomatous polyps 10/2009    of the colon  . Allergic rhinitis   . Anxiety disorder   . Chronic depression     Dr. Cheryln Manly  . Pemphigoid, cicatricial 2012  . Abnormal Pap smear   . Diabetes mellitus without complication     Past Surgical History  Procedure Laterality Date  . Cholecystectomy  1989  . Tubal ligation  2000    Family History  Problem Relation Age of Onset  . Lung cancer Father   . Diabetes Father   . Cancer Father     lung  . Arthritis Mother   . Cancer Brother     skin  . Cancer Maternal Grandmother    colon and vaginal  . Cancer Maternal Grandfather     colon  . COPD Maternal Grandfather   . Hypertension Paternal Grandmother   . Stroke Paternal Grandmother     Social History History  Substance Use Topics  . Smoking status: Former Smoker -- 1.00 packs/day for 40 years    Types: Cigarettes    Quit date: 11/07/2013  . Smokeless tobacco: Not on file  . Alcohol Use: No    Allergies  Allergen Reactions  . Ezetimibe-Simvastatin     REACTION: muscle aches  . Pravachol [Pravastatin Sodium] Other (See Comments)    Joint pains and memory loss  . Simvastatin     REACTION: muscle aches    Current Outpatient Prescriptions  Medication Sig Dispense Refill  . albuterol (PROVENTIL HFA;VENTOLIN HFA) 108 (90 BASE) MCG/ACT inhaler Inhale 2 puffs into the lungs every 6 (six) hours as needed for wheezing or shortness of breath. 1 Inhaler 2  . citalopram (CELEXA) 20 MG tablet Take 1 tablet (20 mg total) by mouth daily. 90 tablet 1  . ezetimibe (ZETIA) 10 MG tablet Take 1 tablet (10 mg total) by mouth daily. 90 tablet 3  . fluticasone (FLONASE) 50 MCG/ACT nasal spray Place 2 sprays into the nose daily. 16 g 3  .  gabapentin (NEURONTIN) 100 MG capsule Take 1 capsule (100 mg total) by mouth 3 (three) times daily. 60 capsule 0  . JANUMET 50-1000 MG per tablet TAKE 1 TABLET BY MOUTH 2 TIMES DAILY WITH A MEAL. 180 tablet PRN  . losartan (COZAAR) 50 MG tablet TAKE 1 TABLET BY MOUTH DAILY 90 tablet 3  . mycophenolate (CELLCEPT) 500 MG tablet 3 tabs twice a day    . pantoprazole (PROTONIX) 40 MG tablet Take 1 tablet (40 mg total) by mouth daily. 90 tablet 3  . pyridOXINE (VITAMIN B-6) 100 MG tablet Take 1 tablet (100 mg total) by mouth 2 (two) times daily. 90 tablet 0  . temazepam (RESTORIL) 7.5 MG capsule Take 1 capsule (7.5 mg total) by mouth at bedtime as needed for sleep. 30 capsule 3  . terbinafine (LAMISIL) 250 MG tablet Take 1 tablet (250 mg total) by mouth daily. 90 tablet 0  . Vitamin D,  Ergocalciferol, (DRISDOL) 50000 UNITS CAPS capsule TAKE 1 CAPSULE BY MOUTH ONCE A WEEK. 12 capsule PRN   No current facility-administered medications for this visit.       Physical Exam Blood pressure 147/89, height 5\' 2"  (1.575 m), weight 190 lb (86.183 kg). Physical Exam Meticulously groomed well-developed well-nourished female, she is alert and oriented 3 mood and affect are normal. She has tenderness over the carpal tunnel the left wrist with decreased sensation in all 5 digits. Wrist joint is stable her grip strength is normal the skin is intact there are no changes in terms of color. She has a good pulse sensation is noted system is negative. Data Reviewed Cervical spine range of motion is normal and without symptoms  Assessment    Most likely a carpal tunnel syndrome    Plan    She would like to proceed with surgery but I think a course of nonoperative treatment is in order we will start be 600 mg twice a day and gabapentin 100 mg 3 times a day follow-up in 2 weeks. However, she would like to communicate by e - chart if possible and I have gone ahead and approve that.

## 2014-11-20 ENCOUNTER — Telehealth: Payer: Self-pay | Admitting: Family Medicine

## 2014-11-20 ENCOUNTER — Encounter: Payer: Self-pay | Admitting: Family Medicine

## 2014-11-20 NOTE — Telephone Encounter (Signed)
needs to add invokana 100 mg one daily pls send in 30 day supply, discuss s/e of excess urination and  may lower blood pressure , increased risk of vag yeast infection as extra sugar excreted in the urine bUT ensure good water intake and kep carbs in check, generally no problem and may have hte extra benefit of weight loss Dose my need to be increased to 300 mg so only 30 day supply for starters

## 2014-11-21 ENCOUNTER — Other Ambulatory Visit: Payer: Self-pay

## 2014-11-21 ENCOUNTER — Encounter: Payer: Self-pay | Admitting: Family Medicine

## 2014-11-21 MED ORDER — CANAGLIFLOZIN 100 MG PO TABS: 100.0000 mg | ORAL_TABLET | Freq: Every day | ORAL | Status: DC

## 2014-11-21 NOTE — Telephone Encounter (Signed)
Patient aware.  Invokana sent to [place].

## 2014-11-23 ENCOUNTER — Other Ambulatory Visit: Payer: Self-pay | Admitting: Family Medicine

## 2014-11-23 DIAGNOSIS — R52 Pain, unspecified: Secondary | ICD-10-CM

## 2014-11-23 DIAGNOSIS — Z8261 Family history of arthritis: Secondary | ICD-10-CM

## 2014-11-23 DIAGNOSIS — L121 Cicatricial pemphigoid: Secondary | ICD-10-CM

## 2014-11-24 ENCOUNTER — Other Ambulatory Visit: Payer: Self-pay | Admitting: Family Medicine

## 2014-11-24 MED ORDER — CANAGLIFLOZIN 300 MG PO TABS: 300.0000 mg | ORAL_TABLET | Freq: Every day | ORAL | Status: DC

## 2014-12-02 ENCOUNTER — Encounter: Payer: Self-pay | Admitting: Family Medicine

## 2014-12-02 ENCOUNTER — Ambulatory Visit (INDEPENDENT_AMBULATORY_CARE_PROVIDER_SITE_OTHER): Payer: 59 | Admitting: Family Medicine

## 2014-12-02 VITALS — BP 120/82 | HR 81 | Resp 16 | Ht 62.0 in | Wt 183.0 lb

## 2014-12-02 DIAGNOSIS — I10 Essential (primary) hypertension: Secondary | ICD-10-CM

## 2014-12-02 DIAGNOSIS — L121 Cicatricial pemphigoid: Secondary | ICD-10-CM

## 2014-12-02 DIAGNOSIS — E785 Hyperlipidemia, unspecified: Secondary | ICD-10-CM

## 2014-12-02 DIAGNOSIS — M79643 Pain in unspecified hand: Secondary | ICD-10-CM

## 2014-12-02 DIAGNOSIS — F32A Depression, unspecified: Secondary | ICD-10-CM

## 2014-12-02 DIAGNOSIS — E119 Type 2 diabetes mellitus without complications: Secondary | ICD-10-CM

## 2014-12-02 DIAGNOSIS — J301 Allergic rhinitis due to pollen: Secondary | ICD-10-CM

## 2014-12-02 DIAGNOSIS — E669 Obesity, unspecified: Secondary | ICD-10-CM

## 2014-12-02 DIAGNOSIS — F329 Major depressive disorder, single episode, unspecified: Secondary | ICD-10-CM

## 2014-12-02 DIAGNOSIS — G4700 Insomnia, unspecified: Secondary | ICD-10-CM

## 2014-12-02 LAB — GLUCOSE, POCT (MANUAL RESULT ENTRY): POC Glucose: 117 mg/dl — AB (ref 70–99)

## 2014-12-02 NOTE — Patient Instructions (Addendum)
F/u in 3 month, call if you need me before  Restrict carbohydrate load to 45 gm per m,eal, crackers and potato soup add up to [facility] more than that  Commit to daily exercise for 30 minutes, figure out some exercise to do in the house  Please reduce fried and fattty foods  Fasting l;ipid, cmp and EGFR and HBA1C in 3 months, approx 5 days before visit  Goal for fasting blood sugar ranges from 80 to 120 and 2 hours after any meal or at bedtime should be between 130 to 170.   OK to test twice daily for next several weeks until blood sugar gets within a normal rnage, call with questions  Call Dr Anette Guarneri office for appt if you do not hear from them in the next 2 weeks  Pls schedule your eye exam   Congrats on 1 year nicotine free!  All the best for 2016!

## 2014-12-02 NOTE — Progress Notes (Signed)
Subjective:    Patient ID: Alicia Neal, female    DOB: December 02, 1960, 54 y.o.   MRN: [id number]  HPI The PT is here for follow up and re-evaluation of chronic medical conditions, medication management and review of any available recent lab and radiology data.  Preventive health is updated, specifically  Cancer screening and Immunization.   She has had severe problems with her pemphigoid disease, states that she had to be on approx 6 weeks of prednisone, blood sugar has become uncontrolled and she has been experiencing significant joint pain, she has expressed concern re the possibility of rheumatoid arhtritis, and wishe  To have evaluation, her Mom has severe disease, she has some small joint defromity and one established auto immune disease     Review of Systems See HPI Denies recent fever or chills. Denies sinus pressure, nasal congestion, ear pain or sore throat. Denies chest congestion, productive cough or wheezing. Recent increased clear nasal drainage and sniffles, no fevr , chills or sinus pressure,  Denies chest pains, palpitations and leg swelling Denies abdominal pain, nausea, vomiting,diarrhea or constipation.   Denies dysuria, frequency, hesitancy or incontinence.  Denies headaches, seizures, numbness, or tingling. Denies depression, anxiety or insomnia. Denies skin break down or rash.        Objective:   Physical Exam  BP 120/82 mmHg  Pulse 81  Resp 16  Ht 5\' 2"  (1.575 m)  Wt 183 lb (83.008 kg)  BMI 33.46 kg/m2  SpO2 98% Patient alert and oriented and in no cardiopulmonary distress.  HEENT: No facial asymmetry, EOMI,   oropharynx pink and moist.  Neck supple no JVD, no mass. TM clear bilaterally, nasal mucosa erythematous and edematous. Oropharynx and gums appear normal, no ulcers, no sinus tenderness Chest: Clear to auscultation bilaterally.  CVS: S1, S2 no murmurs, no S3.Regular rate.  ABD: Soft non tender.   Ext: No edema  MS: Adequate ROM spine,  shoulders, hips and knees.  Skin: Intact, no ulcerations or rash noted.  Psych: Good eye contact, normal affect. Memory intact not anxious or depressed appearing.  CNS: CN 2-12 intact, power,  normal throughout.no focal deficits noted.       Assessment & Plan:  HTN, goal below 130/80 Controlled, no change in medication DASH diet and commitment to daily physical activity for a minimum of 30 minutes discussed and encouraged, as a part of hypertension management. The importance of attaining a healthy weight is also discussed.    ALLERGIC RHINITIS, SEASONAL Uncontrolled, needs to start daily meds   DM type 2, goal A1c below 7 Marked deterioration in ecent times, but has been exposed to high dose steroids , with recent medication adjustment blood sugar has improved, in house test at visit was good (better than she had gotten earlier this morning at home, she is to check her monitor   Pemphigoid, cicatricial Recent severe flare entir mouth involved reportedly, necessitating 6 weeks of prednisone, I advised hat in future , at symptom onset she call dermatology for help , in the hope that extent of debility and duration of high dose prednisone are both lessened   Depression Controlled, no change in medication    Insomnia Sleep hygiene reviewed and written information offered also. Prescription sent for  medication needed.    Obesity (BMI 30.0-34.9) Deteriorated. Patient re-educated about  the importance of commitment to a  minimum of 150 minutes of exercise per week. The importance of healthy food choices with portion control discussed. Encouraged to start  a food diary, count calories and to consider  joining a support group. Sample diet sheets offered. Goals set by the patient for the next several months.      Hyperlipidemia LDL goal <100 Deteriorated Hyperlipidemia:Low fat diet discussed and encouraged.  Updated lab needed at/ before next visit.    Pain, joint,  hand Bilateral hand and finger pain, deformity of digits , f/h of rheumatoid arthritis, pt referred for eval for this disease. 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

## 2014-12-07 ENCOUNTER — Encounter: Payer: Self-pay | Admitting: Family Medicine

## 2014-12-07 DIAGNOSIS — M25549 Pain in joints of unspecified hand: Secondary | ICD-10-CM | POA: Insufficient documentation

## 2014-12-07 NOTE — Assessment & Plan Note (Signed)
Bilateral hand and finger pain, deformity of digits , f/h of rheumatoid arthritis, pt referred for eval for this disease. 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

## 2014-12-07 NOTE — Assessment & Plan Note (Signed)
Marked deterioration in ecent times, but has been exposed to high dose steroids , with recent medication adjustment blood sugar has improved, in house test at visit was good (better than she had gotten earlier this morning at home, she is to check her monitor

## 2014-12-07 NOTE — Assessment & Plan Note (Signed)
Recent severe flare entir mouth involved reportedly, necessitating 6 weeks of prednisone, I advised hat in future , at symptom onset she call dermatology for help , in the hope that extent of debility and duration of high dose prednisone are both lessened

## 2014-12-07 NOTE — Assessment & Plan Note (Signed)
Controlled, no change in medication  

## 2014-12-07 NOTE — Assessment & Plan Note (Signed)
Controlled, no change in medication DASH diet and commitment to daily physical activity for a minimum of 30 minutes discussed and encouraged, as a part of hypertension management. The importance of attaining a healthy weight is also discussed.  

## 2014-12-07 NOTE — Assessment & Plan Note (Signed)
Uncontrolled, needs to start daily meds

## 2014-12-07 NOTE — Assessment & Plan Note (Signed)
Sleep hygiene reviewed and written information offered also. Prescription sent for  medication needed.  

## 2014-12-07 NOTE — Assessment & Plan Note (Signed)
Deteriorated Hyperlipidemia:Low fat diet discussed and encouraged.  Updated lab needed at/ before next visit.

## 2014-12-07 NOTE — Assessment & Plan Note (Signed)
Deteriorated. Patient re-educated about  the importance of commitment to a  minimum of 150 minutes of exercise per week. The importance of healthy food choices with portion control discussed. Encouraged to start a food diary, count calories and to consider  joining a support group. Sample diet sheets offered. Goals set by the patient for the next several months.    

## 2015-01-16 ENCOUNTER — Other Ambulatory Visit (HOSPITAL_COMMUNITY)
Admission: RE | Admit: 2015-01-16 | Discharge: 2015-01-16 | Disposition: A | Discharge status: Home / Self Care | Payer: 59 | Source: Ambulatory Visit | Attending: Gastroenterology | Admitting: Gastroenterology

## 2015-01-16 DIAGNOSIS — I10 Essential (primary) hypertension: Secondary | ICD-10-CM | POA: Insufficient documentation

## 2015-01-16 DIAGNOSIS — E785 Hyperlipidemia, unspecified: Secondary | ICD-10-CM | POA: Diagnosis not present

## 2015-01-16 DIAGNOSIS — L129 Pemphigoid, unspecified: Secondary | ICD-10-CM | POA: Diagnosis not present

## 2015-01-16 DIAGNOSIS — Z79899 Other long term (current) drug therapy: Secondary | ICD-10-CM | POA: Diagnosis not present

## 2015-01-16 DIAGNOSIS — E119 Type 2 diabetes mellitus without complications: Secondary | ICD-10-CM | POA: Diagnosis not present

## 2015-01-16 LAB — COMPREHENSIVE METABOLIC PANEL
ALT: 33 U/L (ref 0–35)
AST: 24 U/L (ref 0–37)
Albumin: 4.2 g/dL (ref 3.5–5.2)
Alkaline Phosphatase: 77 U/L (ref 39–117)
Anion gap: 9 (ref 5–15)
BUN: 13 mg/dL (ref 6–23)
CO2: 27 mmol/L (ref 19–32)
Calcium: 9.8 mg/dL (ref 8.4–10.5)
Chloride: 104 mmol/L (ref 96–112)
Creatinine, Ser: 0.66 mg/dL (ref 0.50–1.10)
GFR calc Af Amer: >90 mL/min (ref >90)
GFR calc non Af Amer: >90 mL/min (ref >90)
GLUCOSE: 83 mg/dL (ref 70–99)
POTASSIUM: 4 mmol/L (ref 3.5–5.1)
Sodium: 140 mmol/L (ref 135–145)
Total Bilirubin: 0.6 mg/dL (ref 0.3–1.2)
Total Protein: 7.9 g/dL (ref 6.0–8.3)

## 2015-01-16 LAB — CBC
HEMATOCRIT: 42.2 % (ref 36.0–46.0)
Hemoglobin: 13.4 g/dL (ref 12.0–15.0)
MCH: 28.1 pg (ref 26.0–34.0)
MCHC: 31.8 g/dL (ref 30.0–36.0)
MCV: 88.5 fL (ref 78.0–100.0)
Platelets: 510 10*3/uL — ABNORMAL HIGH (ref 150–400)
RBC: 4.77 MIL/uL (ref 3.87–5.11)
RDW: 12.9 % (ref 11.5–15.5)
WBC: 10.6 10*3/uL — AB (ref 4.0–10.5)

## 2015-01-22 ENCOUNTER — Other Ambulatory Visit: Payer: Self-pay

## 2015-01-22 DIAGNOSIS — E119 Type 2 diabetes mellitus without complications: Secondary | ICD-10-CM

## 2015-01-22 DIAGNOSIS — Z1159 Encounter for screening for other viral diseases: Secondary | ICD-10-CM

## 2015-01-22 DIAGNOSIS — E785 Hyperlipidemia, unspecified: Secondary | ICD-10-CM

## 2015-01-26 ENCOUNTER — Other Ambulatory Visit: Payer: Self-pay

## 2015-01-26 DIAGNOSIS — F329 Major depressive disorder, single episode, unspecified: Secondary | ICD-10-CM

## 2015-01-26 DIAGNOSIS — F32A Depression, unspecified: Secondary | ICD-10-CM

## 2015-01-26 MED ORDER — CITALOPRAM HYDROBROMIDE 20 MG PO TABS: 20.0000 mg | ORAL_TABLET | Freq: Every day | ORAL | Status: DC

## 2015-02-11 ENCOUNTER — Other Ambulatory Visit (HOSPITAL_COMMUNITY)
Admission: RE | Admit: 2015-02-11 | Discharge: 2015-02-11 | Disposition: A | Discharge status: Home / Self Care | Payer: 59 | Source: Ambulatory Visit | Attending: Family Medicine | Admitting: Family Medicine

## 2015-02-11 DIAGNOSIS — E119 Type 2 diabetes mellitus without complications: Secondary | ICD-10-CM | POA: Diagnosis present

## 2015-02-11 DIAGNOSIS — E785 Hyperlipidemia, unspecified: Secondary | ICD-10-CM | POA: Diagnosis not present

## 2015-02-11 LAB — LIPID PANEL
CHOLESTEROL: 230 mg/dL — AB (ref 0–200)
HDL: 56 mg/dL (ref >39)
LDL Cholesterol: 145 mg/dL — ABNORMAL HIGH (ref 0–99)
Total CHOL/HDL Ratio: 4.1 RATIO
Triglycerides: 143 mg/dL (ref <150)
VLDL: 29 mg/dL (ref 0–40)

## 2015-02-11 LAB — COMPREHENSIVE METABOLIC PANEL
ALBUMIN: 4.2 g/dL (ref 3.5–5.2)
ALT: 21 U/L (ref 0–35)
ANION GAP: 11 (ref 5–15)
AST: 22 U/L (ref 0–37)
Alkaline Phosphatase: 64 U/L (ref 39–117)
BUN: 19 mg/dL (ref 6–23)
CALCIUM: 9.4 mg/dL (ref 8.4–10.5)
CO2: 24 mmol/L (ref 19–32)
Chloride: 104 mmol/L (ref 96–112)
Creatinine, Ser: 0.66 mg/dL (ref 0.50–1.10)
GFR calc Af Amer: >90 mL/min (ref >90)
GFR calc non Af Amer: >90 mL/min (ref >90)
GLUCOSE: 106 mg/dL — AB (ref 70–99)
Potassium: 4.5 mmol/L (ref 3.5–5.1)
SODIUM: 139 mmol/L (ref 135–145)
TOTAL PROTEIN: 7.1 g/dL (ref 6.0–8.3)
Total Bilirubin: 0.4 mg/dL (ref 0.3–1.2)

## 2015-02-12 LAB — HEMOGLOBIN A1C
Hgb A1c MFr Bld: 6.9 % — ABNORMAL HIGH (ref 4.8–5.6)
Mean Plasma Glucose: 151 mg/dL

## 2015-02-13 LAB — HM DIABETES EYE EXAM

## 2015-02-17 ENCOUNTER — Encounter: Payer: Self-pay | Admitting: Family Medicine

## 2015-02-17 ENCOUNTER — Ambulatory Visit (INDEPENDENT_AMBULATORY_CARE_PROVIDER_SITE_OTHER): Payer: 59 | Admitting: Family Medicine

## 2015-02-17 VITALS — BP 130/82 | HR 83 | Resp 16 | Ht 62.0 in | Wt 176.0 lb

## 2015-02-17 DIAGNOSIS — F32A Depression, unspecified: Secondary | ICD-10-CM

## 2015-02-17 DIAGNOSIS — G4730 Sleep apnea, unspecified: Secondary | ICD-10-CM | POA: Diagnosis not present

## 2015-02-17 DIAGNOSIS — E119 Type 2 diabetes mellitus without complications: Secondary | ICD-10-CM | POA: Diagnosis not present

## 2015-02-17 DIAGNOSIS — F329 Major depressive disorder, single episode, unspecified: Secondary | ICD-10-CM

## 2015-02-17 DIAGNOSIS — L121 Cicatricial pemphigoid: Secondary | ICD-10-CM

## 2015-02-17 DIAGNOSIS — R0683 Snoring: Secondary | ICD-10-CM | POA: Diagnosis not present

## 2015-02-17 DIAGNOSIS — E559 Vitamin D deficiency, unspecified: Secondary | ICD-10-CM

## 2015-02-17 DIAGNOSIS — E66811 Obesity, class 1: Secondary | ICD-10-CM

## 2015-02-17 DIAGNOSIS — I10 Essential (primary) hypertension: Secondary | ICD-10-CM

## 2015-02-17 DIAGNOSIS — E785 Hyperlipidemia, unspecified: Secondary | ICD-10-CM

## 2015-02-17 DIAGNOSIS — G4700 Insomnia, unspecified: Secondary | ICD-10-CM

## 2015-02-17 DIAGNOSIS — E669 Obesity, unspecified: Secondary | ICD-10-CM

## 2015-02-17 MED ORDER — CITALOPRAM HYDROBROMIDE 10 MG PO TABS: 10.0000 mg | ORAL_TABLET | Freq: Every day | ORAL | Status: DC

## 2015-02-17 NOTE — Assessment & Plan Note (Signed)
Currently in remission , had sever episode last Fall

## 2015-02-17 NOTE — Assessment & Plan Note (Signed)
Controlled, no change in medication DASH diet and commitment to daily physical activity for a minimum of 30 minutes discussed and encouraged, as a part of hypertension management. The importance of attaining a healthy weight is also discussed.  BP/Weight 02/17/2015 12/02/2014 11/11/2014 06/25/2014 03/18/2014 03/03/2014 4/70/7615  Systolic BP 183 437 357 897 847 841 282  Diastolic BP 82 82 89 82 86 82 86  Wt. (Lbs) 176 183 190 178 182 181 180.12  BMI 32.18 33.46 34.74 32.55 33.28 33.91 33.75

## 2015-02-17 NOTE — Assessment & Plan Note (Signed)
Excessive snoring and apneic spells noted ,refer for eval and treatment, has been intolerant of mask in the past

## 2015-02-17 NOTE — Assessment & Plan Note (Signed)
Improved. Pt applauded on succesful weight loss through lifestyle change, and encouraged to continue same. Weight loss goal set for the next several months.  

## 2015-02-17 NOTE — Patient Instructions (Signed)
F/u in 4 months, call if you need  Me before  Congrats on healthy lifestyle, keep it up  Celexa dose is reduced to 10 mg   STOP zetia  Stop butter, and continue regular exercise   Fasting lipid chem 7 and HBA1C, vit D and TSH

## 2015-02-17 NOTE — Assessment & Plan Note (Signed)
Improved Patient advised to reduce carb and sweets, commit to regular physical activity, take meds as prescribed, test blood as directed, and attempt to lose weight, to improve blood sugar control. Patient educated about the importance of limiting  Carbohydrate intake , the need to commit to daily physical activity for a minimum of 30 minutes , and to commit weight loss. The fact that changes in all these areas will reduce or eliminate all together the development of diabetes is stressed.   Diabetic Labs Latest Ref Rng 02/11/2015 01/16/2015 11/03/2014 06/23/2014 02/24/2014  HbA1c 4.8 - 5.6 % 6.9(H) - 8.5(H) 6.6(H) 6.7(H)  Microalbumin 0.00 - 1.89 mg/dL - - - 0.93 -  Micro/Creat Ratio 0.0 - 30.0 mg/g - - - 6.9 -  Chol 0 - 200 mg/dL 230(H) - 204(H) 192 232(H)  HDL >39 mg/dL 56 - 49 61 49  Calc LDL 0 - 99 mg/dL 145(H) - 122(H) 113(H) 159(H)  Triglycerides <150 mg/dL 143 - 165(H) 91 119  Creatinine 0.50 - 1.10 mg/dL 0.66 0.66 0.65 0.59 0.59   BP/Weight 02/17/2015 12/02/2014 11/11/2014 06/25/2014 03/18/2014 03/03/2014 06/24/2992  Systolic BP 716 967 893 810 175 102 585  Diastolic BP 82 82 89 82 86 82 86  Wt. (Lbs) 176 183 190 178 182 181 180.12  BMI 32.18 33.46 34.74 32.55 33.28 33.91 33.75   Foot/eye exam completion dates 06/25/2014 04/15/2013  Foot Form Completion Done Done

## 2015-02-17 NOTE — Assessment & Plan Note (Signed)
Unchanged and intolerant of zetia , caused joint pain, will work on diet only, butter is a problem Hyperlipidemia:Low fat diet discussed and encouraged.   Lipid Panel  Lab Results  Component Value Date   CHOL 230* 02/11/2015   HDL 56 02/11/2015   LDLCALC 145* 02/11/2015   TRIG 143 02/11/2015   CHOLHDL 4.1 02/11/2015      Updated lab needed at/ before next visit.

## 2015-02-17 NOTE — Assessment & Plan Note (Signed)
Reduce celexa to 10 mg daily

## 2015-02-17 NOTE — Progress Notes (Signed)
Alicia Neal     MRN: [id number]      DOB: Feb 07, 1961   HPI Alicia Neal is here for follow up and re-evaluation of chronic medical conditions, medication management and review of any available recent lab and radiology data.  Preventive health is updated, specifically  Cancer screening and Immunization.   Questions or concerns regarding consultations or procedures which the PT has had in the interim are  addressed. The PT denies any adverse reactions to current medications since the last visit.  There are no new concerns.  There are no specific complaints   ROS Denies recent fever or chills. Denies sinus pressure, nasal congestion, ear pain or sore throat. Denies chest congestion, productive cough or wheezing. Denies chest pains, palpitations and leg swelling Denies abdominal pain, nausea, vomiting,diarrhea or constipation.   Denies dysuria, frequency, hesitancy or incontinence. Denies joint pain, swelling and limitation in mobility. Denies headaches, seizures, numbness, or tingling. Denies uncontrolled depression, anxiety or insomnia.Concerned about snoring and sleep apnea wants this checked Denies skin break down or rash.   PE  BP 130/82 mmHg  Pulse 83  Resp 16  Ht 5\' 2"  (1.575 m)  Wt 176 lb (79.833 kg)  BMI 32.18 kg/m2  SpO2 98%  Patient alert and oriented and in no cardiopulmonary distress.  HEENT: No facial asymmetry, EOMI,   oropharynx pink and moist.  Neck supple no JVD, no mass.  Chest: Clear to auscultation bilaterally.  CVS: S1, S2 no murmurs, no S3.Regular rate.  ABD: Soft non tender.   Ext: No edema  MS: Adequate ROM spine, shoulders, hips and knees.  Skin: Intact, no ulcerations or rash noted.  Psych: Good eye contact, normal affect. Memory intact not anxious or depressed appearing.  CNS: CN 2-12 intact, power,  normal throughout.no focal deficits noted.   Assessment & Plan   HTN, goal below 130/80 Controlled, no change in medication DASH  diet and commitment to daily physical activity for a minimum of 30 minutes discussed and encouraged, as a part of hypertension management. The importance of attaining a healthy weight is also discussed.  BP/Weight 02/17/2015 12/02/2014 11/11/2014 06/25/2014 03/18/2014 03/03/2014 02/02/9241  Systolic BP 683 419 622 297 989 211 941  Diastolic BP 82 82 89 82 86 82 86  Wt. (Lbs) 176 183 190 178 182 181 180.12  BMI 32.18 33.46 34.74 32.55 33.28 33.91 33.75         DM type 2, goal A1c below 7 Improved Patient advised to reduce carb and sweets, commit to regular physical activity, take meds as prescribed, test blood as directed, and attempt to lose weight, to improve blood sugar control. Patient educated about the importance of limiting  Carbohydrate intake , the need to commit to daily physical activity for a minimum of 30 minutes , and to commit weight loss. The fact that changes in all these areas will reduce or eliminate all together the development of diabetes is stressed.   Diabetic Labs Latest Ref Rng 02/11/2015 01/16/2015 11/03/2014 06/23/2014 02/24/2014  HbA1c 4.8 - 5.6 % 6.9(H) - 8.5(H) 6.6(H) 6.7(H)  Microalbumin 0.00 - 1.89 mg/dL - - - 0.93 -  Micro/Creat Ratio 0.0 - 30.0 mg/g - - - 6.9 -  Chol 0 - 200 mg/dL 230(H) - 204(H) 192 232(H)  HDL >39 mg/dL 56 - 49 61 49  Calc LDL 0 - 99 mg/dL 145(H) - 122(H) 113(H) 159(H)  Triglycerides <150 mg/dL 143 - 165(H) 91 119  Creatinine 0.50 - 1.10 mg/dL 0.66  0.66 0.65 0.59 0.59   BP/Weight 02/17/2015 12/02/2014 11/11/2014 06/25/2014 03/18/2014 03/03/2014 03/23/16  Systolic BP 494 496 759 163 846 659 935  Diastolic BP 82 82 89 82 86 82 86  Wt. (Lbs) 176 183 190 178 182 181 180.12  BMI 32.18 33.46 34.74 32.55 33.28 33.91 33.75   Foot/eye exam completion dates 06/25/2014 04/15/2013  Foot Form Completion Done Done        Insomnia Sleep hygiene reviewed and written information offered also. Prescription sent for  medication needed. Pt cutting back on  depenedence on restoril   Depression Reduce celexa to 10 mg daily   Obesity (BMI 30.0-34.9) Improved. Pt applauded on succesful weight loss through lifestyle change, and encouraged to continue same. Weight loss goal set for the next several months.    Hyperlipidemia LDL goal <100 Unchanged and intolerant of zetia , caused joint pain, will work on diet only, butter is a problem Hyperlipidemia:Low fat diet discussed and encouraged.   Lipid Panel  Lab Results  Component Value Date   CHOL 230* 02/11/2015   HDL 56 02/11/2015   LDLCALC 145* 02/11/2015   TRIG 143 02/11/2015   CHOLHDL 4.1 02/11/2015      Updated lab needed at/ before next visit.    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

## 2015-02-17 NOTE — Assessment & Plan Note (Signed)
Sleep hygiene reviewed and written information offered also. Prescription sent for  medication needed. Pt cutting back on depenedence on restoril

## 2015-02-25 ENCOUNTER — Other Ambulatory Visit (HOSPITAL_COMMUNITY): Payer: Self-pay | Admitting: Radiology

## 2015-02-25 DIAGNOSIS — G4730 Sleep apnea, unspecified: Secondary | ICD-10-CM

## 2015-03-03 ENCOUNTER — Other Ambulatory Visit: Payer: Self-pay | Admitting: Family Medicine

## 2015-04-07 ENCOUNTER — Other Ambulatory Visit: Payer: Self-pay | Admitting: Family Medicine

## 2015-05-27 ENCOUNTER — Other Ambulatory Visit: Payer: Self-pay | Admitting: Family Medicine

## 2015-05-27 DIAGNOSIS — Z1231 Encounter for screening mammogram for malignant neoplasm of breast: Secondary | ICD-10-CM

## 2015-05-29 ENCOUNTER — Ambulatory Visit (HOSPITAL_COMMUNITY)
Admission: RE | Admit: 2015-05-29 | Discharge: 2015-05-29 | Disposition: A | Discharge status: Home / Self Care | Payer: 59 | Source: Ambulatory Visit | Attending: Family Medicine | Admitting: Family Medicine

## 2015-05-29 DIAGNOSIS — Z1231 Encounter for screening mammogram for malignant neoplasm of breast: Secondary | ICD-10-CM | POA: Insufficient documentation

## 2015-06-03 ENCOUNTER — Other Ambulatory Visit: Payer: Self-pay

## 2015-06-03 MED ORDER — CANAGLIFLOZIN 300 MG PO TABS: 300.0000 mg | ORAL_TABLET | Freq: Every day | ORAL | Status: DC

## 2015-06-08 ENCOUNTER — Other Ambulatory Visit (HOSPITAL_COMMUNITY)
Admission: RE | Admit: 2015-06-08 | Discharge: 2015-06-08 | Disposition: A | Discharge status: Home / Self Care | Payer: 59 | Source: Ambulatory Visit | Attending: Family Medicine | Admitting: Family Medicine

## 2015-06-08 ENCOUNTER — Other Ambulatory Visit (HOSPITAL_COMMUNITY): Payer: Self-pay | Admitting: Respiratory Therapy

## 2015-06-08 DIAGNOSIS — F329 Major depressive disorder, single episode, unspecified: Secondary | ICD-10-CM | POA: Insufficient documentation

## 2015-06-08 DIAGNOSIS — E119 Type 2 diabetes mellitus without complications: Secondary | ICD-10-CM | POA: Insufficient documentation

## 2015-06-08 DIAGNOSIS — E785 Hyperlipidemia, unspecified: Secondary | ICD-10-CM | POA: Insufficient documentation

## 2015-06-08 DIAGNOSIS — E559 Vitamin D deficiency, unspecified: Secondary | ICD-10-CM | POA: Diagnosis not present

## 2015-06-08 LAB — BASIC METABOLIC PANEL
ANION GAP: 8 (ref 5–15)
BUN: 12 mg/dL (ref 6–20)
CALCIUM: 8.7 mg/dL — AB (ref 8.9–10.3)
CHLORIDE: 104 mmol/L (ref 101–111)
CO2: 24 mmol/L (ref 22–32)
Creatinine, Ser: 0.72 mg/dL (ref 0.44–1.00)
GFR calc non Af Amer: >60 mL/min (ref >60)
Glucose, Bld: 112 mg/dL — ABNORMAL HIGH (ref 65–99)
Potassium: 3.9 mmol/L (ref 3.5–5.1)
Sodium: 136 mmol/L (ref 135–145)

## 2015-06-08 LAB — LIPID PANEL
CHOL/HDL RATIO: 4.5 ratio
Cholesterol: 236 mg/dL — ABNORMAL HIGH (ref 0–200)
HDL: 52 mg/dL (ref >40)
LDL Cholesterol: 152 mg/dL — ABNORMAL HIGH (ref 0–99)
Triglycerides: 158 mg/dL — ABNORMAL HIGH (ref <150)
VLDL: 32 mg/dL (ref 0–40)

## 2015-06-08 LAB — TSH: TSH: 1.62 u[IU]/mL (ref 0.350–4.500)

## 2015-06-09 LAB — HEMOGLOBIN A1C
Hgb A1c MFr Bld: 6.3 % — ABNORMAL HIGH (ref 4.8–5.6)
MEAN PLASMA GLUCOSE: 134 mg/dL

## 2015-06-09 LAB — VITAMIN D 25 HYDROXY (VIT D DEFICIENCY, FRACTURES): VIT D 25 HYDROXY: 27.8 ng/mL — AB (ref 30.0–100.0)

## 2015-06-12 ENCOUNTER — Other Ambulatory Visit (HOSPITAL_COMMUNITY): Payer: Self-pay | Admitting: Respiratory Therapy

## 2015-06-17 ENCOUNTER — Other Ambulatory Visit (HOSPITAL_COMMUNITY): Payer: Self-pay | Admitting: Respiratory Therapy

## 2015-06-18 ENCOUNTER — Encounter: Payer: 59 | Admitting: Sleep Medicine

## 2015-06-18 ENCOUNTER — Ambulatory Visit (INDEPENDENT_AMBULATORY_CARE_PROVIDER_SITE_OTHER): Payer: 59 | Admitting: Family Medicine

## 2015-06-18 ENCOUNTER — Encounter: Payer: Self-pay | Admitting: Family Medicine

## 2015-06-18 VITALS — BP 122/74 | HR 70 | Resp 16 | Ht 62.0 in | Wt 170.0 lb

## 2015-06-18 DIAGNOSIS — F1910 Other psychoactive substance abuse, uncomplicated: Secondary | ICD-10-CM

## 2015-06-18 DIAGNOSIS — Z1159 Encounter for screening for other viral diseases: Secondary | ICD-10-CM | POA: Diagnosis not present

## 2015-06-18 DIAGNOSIS — K219 Gastro-esophageal reflux disease without esophagitis: Secondary | ICD-10-CM

## 2015-06-18 DIAGNOSIS — E785 Hyperlipidemia, unspecified: Secondary | ICD-10-CM

## 2015-06-18 DIAGNOSIS — I10 Essential (primary) hypertension: Secondary | ICD-10-CM | POA: Diagnosis not present

## 2015-06-18 DIAGNOSIS — G4700 Insomnia, unspecified: Secondary | ICD-10-CM

## 2015-06-18 DIAGNOSIS — F329 Major depressive disorder, single episode, unspecified: Secondary | ICD-10-CM

## 2015-06-18 DIAGNOSIS — Z87891 Personal history of nicotine dependence: Secondary | ICD-10-CM

## 2015-06-18 DIAGNOSIS — Z720 Tobacco use: Secondary | ICD-10-CM

## 2015-06-18 DIAGNOSIS — E66811 Obesity, class 1: Secondary | ICD-10-CM

## 2015-06-18 DIAGNOSIS — E119 Type 2 diabetes mellitus without complications: Secondary | ICD-10-CM

## 2015-06-18 DIAGNOSIS — E669 Obesity, unspecified: Secondary | ICD-10-CM

## 2015-06-18 DIAGNOSIS — L121 Cicatricial pemphigoid: Secondary | ICD-10-CM

## 2015-06-18 DIAGNOSIS — E559 Vitamin D deficiency, unspecified: Secondary | ICD-10-CM

## 2015-06-18 DIAGNOSIS — J301 Allergic rhinitis due to pollen: Secondary | ICD-10-CM

## 2015-06-18 DIAGNOSIS — F32A Depression, unspecified: Secondary | ICD-10-CM

## 2015-06-18 MED ORDER — JANUMET 50-1000 MG PO TABS: ORAL_TABLET | ORAL | Status: DC

## 2015-06-18 NOTE — Patient Instructions (Addendum)
F/u in 4 months, call if you need me before  Fasting lipid, cmp and EGFr, HIV, HBA1C in 4 month  CONGRATS on improved blood sugar  Take celexa alt days for 2 weeks, then twice weekly for 2 weeks , then stop  Reduce janumet to one daily due to s/e  Cheese, red meat, butter need to be reduced  Low dose chest scan  Will be scheduled  It is important that you exercise regularly at least 30 minutes 5 times a week. If you develop chest pain, have severe difficulty breathing, or feel very tired, stop exercising immediately and seek medical attention    Aim for additional 5 pound weight

## 2015-06-22 ENCOUNTER — Other Ambulatory Visit (HOSPITAL_COMMUNITY): Payer: Self-pay | Admitting: Respiratory Therapy

## 2015-06-23 DIAGNOSIS — E559 Vitamin D deficiency, unspecified: Secondary | ICD-10-CM | POA: Insufficient documentation

## 2015-06-23 NOTE — Progress Notes (Signed)
Alicia Neal     MRN: [id number]      DOB: [birth year]   HPI Alicia Neal is here for follow up and re-evaluation of chronic medical conditions, medication management and review of any available recent lab and radiology data.  Preventive health is updated, specifically  Cancer screening and Immunization.   Questions or concerns regarding consultations or procedures which the PT has had in the interim are  Addressed.Just completing prednisone taper for pemphigus flare C/o excess diarrhea, not new, will reduce dose of janumet Committed to healthy lifestyle with excellent results except that cholesterol needs to improve Remains nicotine free but ready to enrol in annual screening Denies polyuria, polydipsia, blurred vision , or hypoglycemic episodes.    ROS Denies recent fever or chills. Denies sinus pressure, nasal congestion, ear pain or sore throat. Denies chest congestion, productive cough or wheezing. Denies chest pains, palpitations and leg swelling .   Denies dysuria, frequency, hesitancy or incontinence. Denies joint pain, swelling and limitation in mobility. Denies headaches, seizures, numbness, or tingling. Denies depression, anxiety or insomnia.  PE  BP 122/74 mmHg  Pulse 70  Resp 16  Ht 5\' 2"  (1.575 m)  Wt 170 lb (77.111 kg)  BMI 31.09 kg/m2  SpO2 98%  Patient alert and oriented and in no cardiopulmonary distress.  HEENT: No facial asymmetry, EOMI,   oropharynx pink and moist.  Neck supple no JVD, no mass.  Chest: Clear to auscultation bilaterally.  CVS: S1, S2 no murmurs, no S3.Regular rate.  ABD: Soft non tender.   Ext: No edema  MS: Adequate ROM spine, shoulders, hips and knees.  Skin: Intact, no ulcerations or rash noted.  Psych: Good eye contact, normal affect. Memory intact not anxious or depressed appearing.  CNS: CN 2-12 intact, power,  normal throughout.no focal deficits noted.   Assessment & Plan   HTN, goal below 130/80 Controlled, no  change in medication DASH diet and commitment to daily physical activity for a minimum of 30 minutes discussed and encouraged, as a part of hypertension management. The importance of attaining a healthy weight is also discussed.  BP/Weight 06/18/2015 02/17/2015 12/02/2014 11/11/2014 06/25/2014 03/18/2014 2/70/3500  Systolic BP 938 182 993 716 967 893 810  Diastolic BP 74 82 82 89 82 86 82  Wt. (Lbs) 170 176 183 190 178 182 181  BMI 31.09 32.18 33.46 34.74 32.55 33.28 33.91        DM type 2, goal A1c below 7 Controlled, no change in medication Alicia Neal is reminded of the importance of commitment to daily physical activity for 30 minutes or more, as able and the need to limit carbohydrate intake to 30 to 60 grams per meal to help with blood sugar control.   The need to take medication as prescribed, test blood sugar as directed, and to call between visits if there is a concern that blood sugar is uncontrolled is also discussed.   Alicia Neal is reminded of the importance of daily foot exam, annual eye examination, and good blood sugar, blood pressure and cholesterol control.  Diabetic Labs Latest Ref Rng 06/08/2015 02/11/2015 01/16/2015 11/03/2014 06/23/2014  HbA1c 4.8 - 5.6 % 6.3(H) 6.9(H) - 8.5(H) 6.6(H)  Microalbumin 0.00 - 1.89 mg/dL - - - - 0.93  Micro/Creat Ratio 0.0 - 30.0 mg/g - - - - 6.9  Chol 0 - 200 mg/dL 236(H) 230(H) - 204(H) 192  HDL >40 mg/dL 52 56 - 49 61  Calc LDL 0 - 99 mg/dL 152(H)  145(H) - 122(H) 113(H)  Triglycerides <150 mg/dL 158(H) 143 - 165(H) 91  Creatinine 0.44 - 1.00 mg/dL 0.72 0.66 0.66 0.65 0.59   BP/Weight 06/18/2015 02/17/2015 12/02/2014 11/11/2014 06/25/2014 03/18/2014 3/97/6734  Systolic BP 193 790 240 973 532 992 426  Diastolic BP 74 82 82 89 82 86 82  Wt. (Lbs) 170 176 183 190 178 182 181  BMI 31.09 32.18 33.46 34.74 32.55 33.28 33.91   Foot/eye exam completion dates Latest Ref Rng 02/13/2015 06/25/2014  Eye Exam No Retinopathy No Retinopathy -  Foot Form  Completion - - Done   Reduce med dose due to GI side effect      H/O tobacco use, presenting hazards to health Pt has an established smoking history that exceeds 30 pack years, she quit 18 months ago and will start low dose chest screening  Depression Marked improvement , will complete celexa taper  Obesity (BMI 30.0-34.9) Improved Patient re-educated about  the importance of commitment to a  minimum of 150 minutes of exercise per week.  The importance of healthy food choices with portion control discussed. Encouraged to start a food diary, count calories and to consider  joining a support group. Sample diet sheets offered. Goals set by the patient for the next several months.   Weight /BMI 06/18/2015 02/17/2015 12/02/2014  WEIGHT 170 lb 176 lb 183 lb  HEIGHT 5\' 2"  5\' 2"  5\' 2"   BMI 31.09 kg/m2 32.18 kg/m2 33.46 kg/m2    Current exercise per week 200 minutes.   Hyperlipidemia LDL goal <100 Uncontrolled Hyperlipidemia:Low fat diet discussed and encouraged.   Lipid Panel  Lab Results  Component Value Date   CHOL 236* 06/08/2015   HDL 52 06/08/2015   LDLCALC 152* 06/08/2015   TRIG 158* 06/08/2015   CHOLHDL 4.5 06/08/2015   Updated lab needed at/ before next visit.      GERD (gastroesophageal reflux disease) Controlled, no change in medication   ALLERGIC RHINITIS, SEASONAL Controlled, no change in medication   Pemphigoid, cicatricial Managed through dermatology at [facility], recovering from recent flare, maintained on cellcept  Insomnia Improved Sleep hygiene reviewed and written information offered also. Prescription sent for  medication needed.   Vitamin D deficiency Updated lab needed at/ before next visit. Continue weekly vit D

## 2015-06-23 NOTE — Assessment & Plan Note (Signed)
Controlled, no change in medication  

## 2015-06-23 NOTE — Assessment & Plan Note (Signed)
Controlled, no change in medication Alicia Neal is reminded of the importance of commitment to daily physical activity for 30 minutes or more, as able and the need to limit carbohydrate intake to 30 to 60 grams per meal to help with blood sugar control.   The need to take medication as prescribed, test blood sugar as directed, and to call between visits if there is a concern that blood sugar is uncontrolled is also discussed.   Alicia Neal is reminded of the importance of daily foot exam, annual eye examination, and good blood sugar, blood pressure and cholesterol control.  Diabetic Labs Latest Ref Rng 06/08/2015 02/11/2015 01/16/2015 11/03/2014 06/23/2014  HbA1c 4.8 - 5.6 % 6.3(H) 6.9(H) - 8.5(H) 6.6(H)  Microalbumin 0.00 - 1.89 mg/dL - - - - 0.93  Micro/Creat Ratio 0.0 - 30.0 mg/g - - - - 6.9  Chol 0 - 200 mg/dL 236(H) 230(H) - 204(H) 192  HDL >40 mg/dL 52 56 - 49 61  Calc LDL 0 - 99 mg/dL 152(H) 145(H) - 122(H) 113(H)  Triglycerides <150 mg/dL 158(H) 143 - 165(H) 91  Creatinine 0.44 - 1.00 mg/dL 0.72 0.66 0.66 0.65 0.59   BP/Weight 06/18/2015 02/17/2015 12/02/2014 11/11/2014 06/25/2014 03/18/2014 5/88/3254  Systolic BP 982 641 583 094 076 808 811  Diastolic BP 74 82 82 89 82 86 82  Wt. (Lbs) 170 176 183 190 178 182 181  BMI 31.09 32.18 33.46 34.74 32.55 33.28 33.91   Foot/eye exam completion dates Latest Ref Rng 02/13/2015 06/25/2014  Eye Exam No Retinopathy No Retinopathy -  Foot Form Completion - - Done   Reduce med dose due to GI side effect

## 2015-06-23 NOTE — Assessment & Plan Note (Signed)
Updated lab needed at/ before next visit. Continue weekly vit D

## 2015-06-23 NOTE — Assessment & Plan Note (Signed)
Improved Patient re-educated about  the importance of commitment to a  minimum of 150 minutes of exercise per week.  The importance of healthy food choices with portion control discussed. Encouraged to start a food diary, count calories and to consider  joining a support group. Sample diet sheets offered. Goals set by the patient for the next several months.   Weight /BMI 06/18/2015 02/17/2015 12/02/2014  WEIGHT 170 lb 176 lb 183 lb  HEIGHT 5\' 2"  5\' 2"  5\' 2"   BMI 31.09 kg/m2 32.18 kg/m2 33.46 kg/m2    Current exercise per week 200 minutes.

## 2015-06-23 NOTE — Assessment & Plan Note (Signed)
Pt has an established smoking history that exceeds 30 pack years, she quit 18 months ago and will start low dose chest screening

## 2015-06-23 NOTE — Assessment & Plan Note (Signed)
Marked improvement , will complete celexa taper

## 2015-06-23 NOTE — Assessment & Plan Note (Signed)
Improved Sleep hygiene reviewed and written information offered also. Prescription sent for  medication needed.

## 2015-06-23 NOTE — Assessment & Plan Note (Signed)
Uncontrolled Hyperlipidemia:Low fat diet discussed and encouraged.   Lipid Panel  Lab Results  Component Value Date   CHOL 236* 06/08/2015   HDL 52 06/08/2015   LDLCALC 152* 06/08/2015   TRIG 158* 06/08/2015   CHOLHDL 4.5 06/08/2015   Updated lab needed at/ before next visit.

## 2015-06-23 NOTE — Assessment & Plan Note (Signed)
Managed through dermatology at [facility], recovering from recent flare, maintained on cellcept

## 2015-06-23 NOTE — Assessment & Plan Note (Signed)
Controlled, no change in medication DASH diet and commitment to daily physical activity for a minimum of 30 minutes discussed and encouraged, as a part of hypertension management. The importance of attaining a healthy weight is also discussed.  BP/Weight 06/18/2015 02/17/2015 12/02/2014 11/11/2014 06/25/2014 03/18/2014 08/05/5746  Systolic BP 340 370 964 383 818 403 754  Diastolic BP 74 82 82 89 82 86 82  Wt. (Lbs) 170 176 183 190 178 182 181  BMI 31.09 32.18 33.46 34.74 32.55 33.28 33.91

## 2015-06-26 ENCOUNTER — Ambulatory Visit (HOSPITAL_COMMUNITY): Admission: RE | Admit: 2015-06-26 | Payer: 59 | Source: Ambulatory Visit

## 2015-07-24 ENCOUNTER — Other Ambulatory Visit (HOSPITAL_COMMUNITY): Payer: Self-pay | Admitting: Respiratory Therapy

## 2015-08-19 ENCOUNTER — Encounter: Payer: Self-pay | Admitting: Family Medicine

## 2015-08-23 ENCOUNTER — Other Ambulatory Visit: Payer: Self-pay | Admitting: Family Medicine

## 2015-08-25 ENCOUNTER — Other Ambulatory Visit: Payer: Self-pay

## 2015-08-25 MED ORDER — CITALOPRAM HYDROBROMIDE 10 MG PO TABS: 10.0000 mg | ORAL_TABLET | Freq: Every day | ORAL | Status: DC

## 2015-08-27 ENCOUNTER — Other Ambulatory Visit: Payer: Self-pay | Admitting: Family Medicine

## 2015-09-10 ENCOUNTER — Ambulatory Visit (INDEPENDENT_AMBULATORY_CARE_PROVIDER_SITE_OTHER): Payer: 59 | Admitting: Acute Care

## 2015-09-10 ENCOUNTER — Encounter: Payer: Self-pay | Admitting: Acute Care

## 2015-09-10 ENCOUNTER — Ambulatory Visit: Admission: RE | Admit: 2015-09-10 | Payer: 59 | Source: Ambulatory Visit

## 2015-09-10 ENCOUNTER — Other Ambulatory Visit: Payer: Self-pay | Admitting: Acute Care

## 2015-09-10 DIAGNOSIS — F17210 Nicotine dependence, cigarettes, uncomplicated: Secondary | ICD-10-CM

## 2015-09-10 DIAGNOSIS — Z87891 Personal history of nicotine dependence: Secondary | ICD-10-CM

## 2015-09-10 NOTE — Progress Notes (Signed)
Shared Decision Making Visit Lung Cancer Screening Program [phone])   Eligibility:  Age 54 y.o.  Pack Years Smoking History Calculation 40 pack years (# packs/per year x # years smoked)  Recent History of coughing up blood  no  Unexplained weight loss? no ( >Than 15 pounds within the last 6 months )  Prior History Lung / other cancer no (Diagnosis within the last 5 years already requiring surveillance chest CT Scans).  Smoking Status: Former smoker  Former Smokers: Years since quit: 1  Quit Date: 11/07/2013  Visit Components:  Discussion included one or more decision making aids. yes  Discussion included risk/benefits of screening. yes  Discussion included potential follow up diagnostic testing for abnormal scans. yes  Discussion included meaning and risk of over diagnosis. yes  Discussion included meaning and risk of False Positives. yes  Discussion included meaning of total radiation exposure. yes  Counseling Included:  Importance of adherence to annual lung cancer LDCT screening. yes  Impact of comorbidities on ability to participate in the program. yes  Ability and willingness to under diagnostic treatment. yes  Smoking Cessation Counseling:  Current Smokers:   Discussed importance of smoking cessation. NA; Former smoker  Information about tobacco cessation classes and interventions provided to patient. yes  Patient provided with "ticket" for LDCT Scan. yes  Symptomatic Patient. no  CounselingNA  Diagnosis Code: Tobacco Use Z72.0  Asymptomatic Patient yes  Counseling NA; already quit  Former Smokers:   Discussed the importance of maintaining cigarette abstinence. Yes  Diagnosis Code: Personal History of Nicotine Dependence. X79.390  Information about tobacco cessation classes and interventions provided to patient. Yes  Patient provided with "ticket" for LDCT Scan. yes  Written Order for Lung Cancer Screening with LDCT placed in Epic. Yes (CT  Chest Lung Cancer Screening Low Dose W/O CM) [id number] Z12.2-Screening of respiratory organs Z87.891-Personal history of nicotine dependence  I spent 15 minutes of face to face time with Ms. Marton discussing the risks and benefits of lung cancer screening. We viewed a power point together that discussed the above noted topics, pausing at intervals to allow for questions to be asked and answered to ensure understanding. We discussed that she had already taken  the single most powerful action that she could to decrease her risk of developing lung cancer by quitting smoking. She quit cold [place] and has no desire to smoke again. We discussed the time and location of her scan appointment, and that I will call with the results of the scan within 24-48 hours of receiving them. I have given Ms. Schauf my card and contact information and told her to call me with any questions or concerns. Additionally I have provided her with a copy of the power point we reviewed for use as a resource in the future.She verbalized understanding of all of the above and had no further questions upon leaving my office.   Magdalen Spatz, NP

## 2015-09-11 ENCOUNTER — Ambulatory Visit (HOSPITAL_COMMUNITY)
Admission: RE | Admit: 2015-09-11 | Discharge: 2015-09-11 | Disposition: A | Discharge status: Home / Self Care | Payer: 59 | Source: Ambulatory Visit | Attending: Acute Care | Admitting: Acute Care

## 2015-09-11 DIAGNOSIS — Z87891 Personal history of nicotine dependence: Secondary | ICD-10-CM | POA: Diagnosis present

## 2015-09-11 DIAGNOSIS — Z122 Encounter for screening for malignant neoplasm of respiratory organs: Secondary | ICD-10-CM | POA: Insufficient documentation

## 2015-09-11 DIAGNOSIS — D3500 Benign neoplasm of unspecified adrenal gland: Secondary | ICD-10-CM | POA: Diagnosis not present

## 2015-09-11 DIAGNOSIS — I700 Atherosclerosis of aorta: Secondary | ICD-10-CM | POA: Diagnosis not present

## 2015-09-14 ENCOUNTER — Telehealth: Payer: Self-pay | Admitting: Acute Care

## 2015-09-14 NOTE — Telephone Encounter (Signed)
I have called these results to the patient. I explained that her scan was read as a Lung RADS 2, nodules with very low likelihood of becoming a clinically active cancer due to lack is size or growth. I explained that the recommendation is for a follow up CT in 12 months, which we will call and schedule to be done at [facility] in [place] . I also reviewed the incidental findings of Aortic Atherosclerosis and a right adrenal nodule, which the radiologist feels is a benign adenoma, both of which were seen in previous Ct's and the patient was aware of. I will message these results to Alicia Neal, the patient's PCP, for continuity of care. Alicia Neal had no further questions for me upon ending the call, but has my contact information in the event she does have any questions in the future.

## 2015-10-14 ENCOUNTER — Other Ambulatory Visit (HOSPITAL_COMMUNITY)
Admission: RE | Admit: 2015-10-14 | Discharge: 2015-10-14 | Disposition: A | Discharge status: Home / Self Care | Payer: 59 | Source: Ambulatory Visit | Attending: Family Medicine | Admitting: Family Medicine

## 2015-10-14 DIAGNOSIS — E119 Type 2 diabetes mellitus without complications: Secondary | ICD-10-CM | POA: Insufficient documentation

## 2015-10-14 DIAGNOSIS — Z1159 Encounter for screening for other viral diseases: Secondary | ICD-10-CM | POA: Insufficient documentation

## 2015-10-14 DIAGNOSIS — E785 Hyperlipidemia, unspecified: Secondary | ICD-10-CM | POA: Insufficient documentation

## 2015-10-14 LAB — COMPREHENSIVE METABOLIC PANEL
ALT: 18 U/L (ref 14–54)
AST: 16 U/L (ref 15–41)
Albumin: 4.5 g/dL (ref 3.5–5.0)
Alkaline Phosphatase: 59 U/L (ref 38–126)
Anion gap: 5 (ref 5–15)
BUN: 18 mg/dL (ref 6–20)
CO2: 30 mmol/L (ref 22–32)
Calcium: 9.3 mg/dL (ref 8.9–10.3)
Chloride: 102 mmol/L (ref 101–111)
Creatinine, Ser: 0.68 mg/dL (ref 0.44–1.00)
GFR calc Af Amer: >60 mL/min (ref >60)
GFR calc non Af Amer: >60 mL/min (ref >60)
Glucose, Bld: 120 mg/dL — ABNORMAL HIGH (ref 65–99)
Potassium: 4.3 mmol/L (ref 3.5–5.1)
Sodium: 137 mmol/L (ref 135–145)
Total Bilirubin: 0.7 mg/dL (ref 0.3–1.2)
Total Protein: 7.2 g/dL (ref 6.5–8.1)

## 2015-10-14 LAB — LIPID PANEL
Cholesterol: 272 mg/dL — ABNORMAL HIGH (ref 0–200)
HDL: 56 mg/dL (ref >40)
LDL Cholesterol: 176 mg/dL — ABNORMAL HIGH (ref 0–99)
Total CHOL/HDL Ratio: 4.9 RATIO
Triglycerides: 199 mg/dL — ABNORMAL HIGH (ref <150)
VLDL: 40 mg/dL (ref 0–40)

## 2015-10-15 LAB — HEMOGLOBIN A1C
Hgb A1c MFr Bld: 6.5 % — ABNORMAL HIGH (ref 4.8–5.6)
MEAN PLASMA GLUCOSE: 140 mg/dL

## 2015-10-15 LAB — HIV ANTIBODY (ROUTINE TESTING W REFLEX): HIV SCREEN 4TH GENERATION: NONREACTIVE

## 2015-10-19 ENCOUNTER — Ambulatory Visit: Payer: 59 | Admitting: Orthopedic Surgery

## 2015-10-22 ENCOUNTER — Encounter: Payer: Self-pay | Admitting: Family Medicine

## 2015-10-22 ENCOUNTER — Ambulatory Visit (INDEPENDENT_AMBULATORY_CARE_PROVIDER_SITE_OTHER): Payer: 59 | Admitting: Family Medicine

## 2015-10-22 VITALS — BP 124/82 | HR 70 | Resp 16 | Ht 62.0 in | Wt 173.0 lb

## 2015-10-22 DIAGNOSIS — I10 Essential (primary) hypertension: Secondary | ICD-10-CM | POA: Diagnosis not present

## 2015-10-22 DIAGNOSIS — G5600 Carpal tunnel syndrome, unspecified upper limb: Secondary | ICD-10-CM

## 2015-10-22 DIAGNOSIS — E785 Hyperlipidemia, unspecified: Secondary | ICD-10-CM | POA: Diagnosis not present

## 2015-10-22 DIAGNOSIS — F411 Generalized anxiety disorder: Secondary | ICD-10-CM

## 2015-10-22 DIAGNOSIS — E559 Vitamin D deficiency, unspecified: Secondary | ICD-10-CM | POA: Diagnosis not present

## 2015-10-22 DIAGNOSIS — K219 Gastro-esophageal reflux disease without esophagitis: Secondary | ICD-10-CM

## 2015-10-22 DIAGNOSIS — E669 Obesity, unspecified: Secondary | ICD-10-CM

## 2015-10-22 DIAGNOSIS — E119 Type 2 diabetes mellitus without complications: Secondary | ICD-10-CM

## 2015-10-22 DIAGNOSIS — L121 Cicatricial pemphigoid: Secondary | ICD-10-CM

## 2015-10-22 MED ORDER — FLUTICASONE PROPIONATE 50 MCG/ACT NA SUSP: 2.0000 | Freq: Every day | NASAL | Status: DC

## 2015-10-22 MED ORDER — LOSARTAN POTASSIUM 25 MG PO TABS: 25.0000 mg | ORAL_TABLET | Freq: Every day | ORAL | Status: DC

## 2015-10-22 MED ORDER — EZETIMIBE 10 MG PO TABS: 10.0000 mg | ORAL_TABLET | Freq: Every day | ORAL | Status: DC

## 2015-10-22 NOTE — Patient Instructions (Addendum)
F/u in 4 month, call if you need me before  Start new is zetia  one at bedtime, and stop the NUTS!  Cholesterol will improve  Losartan lower dose Half 50 mg tab till done, only for kidney protectiomn  It is important that you exercise regularly at least 30 minutes 5 times a week. If you develop chest pain, have severe difficulty breathing, or feel very tired, stop exercising immediately and seek medical attention   A healthy diet is rich in fruit, vegetables and whole grains. Poultry fish, nuts and beans are a healthy choice for protein rather then red meat. A low sodium diet and drinking 64 ounces of water daily is generally recommended. Oils and sweet should be limited. Carbohydrates especially for those who are diabetic or overweight, should be limited to 45 to 60 gram per meal. It is important to eat on a regular schedule, at least 3 times daily. Snacks should be primarily fruits, vegetables or nuts.  Fasting lipid, cmp and EGFr, Vit D, hBA1c in 4 month and CBC  All the best with surgery and 2017!

## 2015-10-22 NOTE — Progress Notes (Signed)
Subjective:    Patient ID: Alicia Neal, female    DOB: 11-Apr-1961, 54 y.o.   MRN: [id number]  HPI   Alicia Neal     MRN: [id number]      DOB: June 19, 1961   HPI Alicia Neal is here for follow up and re-evaluation of chronic medical conditions, medication management and review of any available recent lab and radiology data.  Preventive health is updated, specifically  Cancer screening and Immunization.   Questions or concerns regarding consultations or procedures which the PT has had in the interim are  Addressed.Orhto recommends surgery for  Carpal tunnel synd which now disturbs her sleep, also has a hand nodule which may be causing triggering  The PT denies any adverse reactions to current medications since the last visit.  Denies polyuria, polydipsia, blurred vision , or hypoglycemic episodes. Still walks daily and enjoys this. Had to resume citalopram for uncontrolled GAD C/ o excessive reflux symptoms, denies dysphagia, has been off PPI will start an H2 blocker and no caffeine, elevation of HOB also recommended  ROS Denies recent fever or chills. Denies sinus pressure, nasal congestion, ear pain or sore throat. Denies chest congestion, productive cough or wheezing. Denies chest pains, palpitations and leg swelling Denies , nausea, vomiting,diarrhea or constipation.   Denies dysuria, frequency, hesitancy does have  incontinence.  Denies headaches, seizures, . Denies depression, uncontrolled  anxiety  Denies skin break down or rash.   PE  BP 124/82 mmHg  Pulse 70  Resp 16  Ht 5\' 2"  (1.575 m)  Wt 173 lb (78.472 kg)  BMI 31.63 kg/m2  SpO2 98%  Patient alert and oriented and in no cardiopulmonary distress.  HEENT: No facial asymmetry, EOMI,   oropharynx pink and moist.  Neck supple no JVD, no mass.  Chest: Clear to auscultation bilaterally.  CVS: S1, S2 no murmurs, no S3.Regular rate.  ABD: Soft non tender.   Ext: No edema  MS: Adequate ROM spine, shoulders,  hips and knees.  Skin: Intact, no ulcerations or rash noted.  Psych: Good eye contact, normal affect. Memory intact not anxious or depressed appearing.  CNS: CN 2-12 intact, power,  normal throughout.no focal deficits noted.   Assessment & Plan  HTN, goal below 130/80 Controlled off med, however , will reduce lower dose ARB for renalk protection as she is diabetic DASH diet and commitment to daily physical activity for a minimum of 30 minutes discussed and encouraged, as a part of hypertension management. The importance of attaining a healthy weight is also discussed.  BP/Weight 10/22/2015 06/18/2015 02/17/2015 12/02/2014 11/11/2014 06/25/2014 99991111  Systolic BP A999333 [id number] 0000000 [id number]  Diastolic BP 82 74 82 82 89 82 86  Wt. (Lbs) 173 170 176 183 190 178 182  BMI 31.63 31.09 32.18 33.46 34.74 32.55 33.28        GERD (gastroesophageal reflux disease) Uncontrolled with severe symptoms of reflux actually disturbing her sleep. She had discontinued protonix in an attempt to wean off of med, howaver, is advised to resume zantac/ H2  blocker initially.Needs hydrogen breath test due to symptom severity She denies dysphagia Caffeine to be fully eliminated and elevation of bed head also suggested  She is to call back in next 2 to 3 weeks if symptoms persist  For resumption of PPI  DM type 2, goal A1c below 7 Alicia Neal is reminded of the importance of commitment to daily physical activity for 30 minutes or more, as able and  the need to limit carbohydrate intake to 30 to 60 grams per meal to help with blood sugar control.   The need to take medication as prescribed, test blood sugar as directed, and to call between visits if there is a concern that blood sugar is uncontrolled is also discussed.   Alicia Neal is reminded of the importance of daily foot exam, annual eye examination, and good blood sugar, blood pressure and cholesterol control. Controlled, no change in  medication    Diabetic Labs Latest Ref Rng 10/14/2015 06/08/2015 02/11/2015 01/16/2015 11/03/2014  HbA1c 4.8 - 5.6 % 6.5(H) 6.3(H) 6.9(H) - 8.5(H)  Microalbumin 0.00 - 1.89 mg/dL - - - - -  Micro/Creat Ratio 0.0 - 30.0 mg/g - - - - -  Chol 0 - 200 mg/dL 272(H) 236(H) 230(H) - 204(H)  HDL >40 mg/dL 56 52 56 - 49  Calc LDL 0 - 99 mg/dL 176(H) 152(H) 145(H) - 122(H)  Triglycerides <150 mg/dL 199(H) 158(H) 143 - 165(H)  Creatinine 0.44 - 1.00 mg/dL 0.68 0.72 0.66 0.66 0.65   BP/Weight 10/22/2015 06/18/2015 02/17/2015 12/02/2014 11/11/2014 06/25/2014 [id number]  Systolic BP A999333 [id number] 123456 [id number] 0000000 [id number]  Diastolic BP 82 74 82 82 89 82 86  Wt. (Lbs) 173 170 176 183 190 178 182  BMI 31.63 31.09 32.18 33.46 34.74 32.55 33.28   Foot/eye exam completion dates Latest Ref Rng 02/13/2015 06/25/2014  Eye Exam No Retinopathy No Retinopathy -  Foot Form Completion - - Done         Pemphigoid, cicatricial Currently stable no flare since last visit  Obesity (BMI 30.0-34.9) Deteriorated. Patient re-educated about  the importance of commitment to a  minimum of 150 minutes of exercise per week.  The importance of healthy food choices with portion control discussed. Encouraged to start a food diary, count calories and to consider  joining a support group. Sample diet sheets offered. Goals set by the patient for the next several months.   Weight /BMI 10/22/2015 06/18/2015 02/17/2015  WEIGHT 173 lb 170 lb 176 lb  HEIGHT 5\' 2"  5\' 2"  5\' 2"   BMI 31.63 kg/m2 31.09 kg/m2 32.18 kg/m2    Current exercise per week over 250 minutes.   Hyperlipidemia LDL goal <100 Deteriroated, over eating nuts Start zetia, toitally intolerant of all other lipid lowering meds Hyperlipidemia:Low fat diet discussed and encouraged.   Lipid Panel  Lab Results  Component Value Date   CHOL 272* 10/14/2015   HDL 56 10/14/2015   LDLCALC 176* 10/14/2015   TRIG 199* 10/14/2015   CHOLHDL 4.9 10/14/2015      Updated lab needed at/  before next visit.   GAD (generalized anxiety disorder) Recent attempt to discontinue citalopram WAS UNSUCCESSFUL, DUE TO ANXIETY, RESUMED SAME AT LOW DOSE WITH GREAT SUCCESS, SHE IS TO REMAIN ON MEDICATION  Carpal tunnel syndrome Has had recent ortho eval and surgery is planned for January, also has cyst/ nodule in hand causing trigger finger      Review of Systems     Objective:   Physical Exam        Assessment & Plan:

## 2015-10-25 ENCOUNTER — Telehealth: Payer: Self-pay | Admitting: Family Medicine

## 2015-10-25 DIAGNOSIS — G5600 Carpal tunnel syndrome, unspecified upper limb: Secondary | ICD-10-CM | POA: Insufficient documentation

## 2015-10-25 DIAGNOSIS — F411 Generalized anxiety disorder: Secondary | ICD-10-CM | POA: Insufficient documentation

## 2015-10-25 DIAGNOSIS — K219 Gastro-esophageal reflux disease without esophagitis: Secondary | ICD-10-CM

## 2015-10-25 DIAGNOSIS — R1013 Epigastric pain: Secondary | ICD-10-CM

## 2015-10-25 NOTE — Assessment & Plan Note (Signed)
Deteriorated. Patient re-educated about  the importance of commitment to a  minimum of 150 minutes of exercise per week.  The importance of healthy food choices with portion control discussed. Encouraged to start a food diary, count calories and to consider  joining a support group. Sample diet sheets offered. Goals set by the patient for the next several months.   Weight /BMI 10/22/2015 06/18/2015 02/17/2015  WEIGHT 173 lb 170 lb 176 lb  HEIGHT 5\' 2"  5\' 2"  5\' 2"   BMI 31.63 kg/m2 31.09 kg/m2 32.18 kg/m2    Current exercise per week over 250 minutes.

## 2015-10-25 NOTE — Assessment & Plan Note (Addendum)
Uncontrolled with severe symptoms of reflux actually disturbing her sleep. She had discontinued protonix in an attempt to wean off of med, howaver, is advised to resume zantac/ H2  blocker initially.Needs hydrogen breath test due to symptom severity She denies dysphagia Caffeine to be fully eliminated and elevation of bed head also suggested  She is to call back in next 2 to 3 weeks if symptoms persist  For resumption of PPI

## 2015-10-25 NOTE — Assessment & Plan Note (Signed)
Deteriroated, over eating nuts Start zetia, toitally intolerant of all other lipid lowering meds Hyperlipidemia:Low fat diet discussed and encouraged.   Lipid Panel  Lab Results  Component Value Date   CHOL 272* 10/14/2015   HDL 56 10/14/2015   LDLCALC 176* 10/14/2015   TRIG 199* 10/14/2015   CHOLHDL 4.9 10/14/2015      Updated lab needed at/ before next visit.

## 2015-10-25 NOTE — Assessment & Plan Note (Signed)
Currently stable no flare since last visit

## 2015-10-25 NOTE — Assessment & Plan Note (Addendum)
Has had recent ortho eval and surgery is planned for January, also has cyst/ nodule in hand causing trigger finger

## 2015-10-25 NOTE — Assessment & Plan Note (Signed)
Controlled off med, however , will reduce lower dose ARB for renalk protection as she is diabetic DASH diet and commitment to daily physical activity for a minimum of 30 minutes discussed and encouraged, as a part of hypertension management. The importance of attaining a healthy weight is also discussed.  BP/Weight 10/22/2015 06/18/2015 02/17/2015 12/02/2014 11/11/2014 06/25/2014 [id number]  Systolic BP A999333 [id number] 123456 [id number] 0000000 [id number]  Diastolic BP 82 74 82 82 89 82 86  Wt. (Lbs) 173 170 176 183 190 178 182  BMI 31.63 31.09 32.18 33.46 34.74 32.55 33.28

## 2015-10-25 NOTE — Assessment & Plan Note (Signed)
Recent attempt to discontinue citalopram WAS UNSUCCESSFUL, DUE TO ANXIETY, RESUMED SAME AT LOW DOSE WITH GREAT SUCCESS, SHE IS TO REMAIN ON MEDICATION

## 2015-10-25 NOTE — Assessment & Plan Note (Signed)
Alicia Neal is reminded of the importance of commitment to daily physical activity for 30 minutes or more, as able and the need to limit carbohydrate intake to 30 to 60 grams per meal to help with blood sugar control.   The need to take medication as prescribed, test blood sugar as directed, and to call between visits if there is a concern that blood sugar is uncontrolled is also discussed.   Alicia Neal is reminded of the importance of daily foot exam, annual eye examination, and good blood sugar, blood pressure and cholesterol control. Controlled, no change in medication    Diabetic Labs Latest Ref Rng 10/14/2015 06/08/2015 02/11/2015 01/16/2015 11/03/2014  HbA1c 4.8 - 5.6 % 6.5(H) 6.3(H) 6.9(H) - 8.5(H)  Microalbumin 0.00 - 1.89 mg/dL - - - - -  Micro/Creat Ratio 0.0 - 30.0 mg/g - - - - -  Chol 0 - 200 mg/dL 272(H) 236(H) 230(H) - 204(H)  HDL >40 mg/dL 56 52 56 - 49  Calc LDL 0 - 99 mg/dL 176(H) 152(H) 145(H) - 122(H)  Triglycerides <150 mg/dL 199(H) 158(H) 143 - 165(H)  Creatinine 0.44 - 1.00 mg/dL 0.68 0.72 0.66 0.66 0.65   BP/Weight 10/22/2015 06/18/2015 02/17/2015 12/02/2014 11/11/2014 06/25/2014 [id number]  Systolic BP A999333 [id number] 123456 [id number] 0000000 [id number]  Diastolic BP 82 74 82 82 89 82 86  Wt. (Lbs) 173 170 176 183 190 178 182  BMI 31.63 31.09 32.18 33.46 34.74 32.55 33.28   Foot/eye exam completion dates Latest Ref Rng 02/13/2015 06/25/2014  Eye Exam No Retinopathy No Retinopathy -  Foot Form Completion - - Done

## 2015-10-25 NOTE — Telephone Encounter (Signed)
Pls do f/u call to pt. GERD not adequately addressed at visit. Pls mail her info on management to inc no caffeine , weight loss and elevation of HOB etc, stress importance of doing this My understanding is that she intends to try an H2 blocker like  Zantac or pepcid which she can call in for before resuming PPI NEEDS breath test due to symptom severity, pls check lab to see if any prob with her getting this now, she is not on a PPI and she needs it  Pls get back to me with updated info, thanks!

## 2015-10-26 LAB — MICROALBUMIN / CREATININE URINE RATIO
CREATININE, URINE: 65 mg/dL (ref 20–320)
MICROALB/CREAT RATIO: 6 ug/mg{creat} (ref <30)
Microalb, Ur: 0.4 mg/dL

## 2015-10-27 NOTE — Addendum Note (Signed)
Addended by: Denman George B on: 10/27/2015 10:30 AM   Modules accepted: Orders

## 2015-10-27 NOTE — Telephone Encounter (Signed)
Patient agrees to breath test.  Wants to see if she can have done at [facility] lab so will mail lab order.  Will also mail info on gerd tips

## 2015-10-29 ENCOUNTER — Other Ambulatory Visit: Payer: Self-pay

## 2015-10-29 MED ORDER — TEMAZEPAM 7.5 MG PO CAPS: ORAL_CAPSULE | ORAL | Status: DC

## 2015-11-19 ENCOUNTER — Encounter (HOSPITAL_BASED_OUTPATIENT_CLINIC_OR_DEPARTMENT_OTHER): Payer: Self-pay | Admitting: *Deleted

## 2015-11-20 ENCOUNTER — Encounter (HOSPITAL_BASED_OUTPATIENT_CLINIC_OR_DEPARTMENT_OTHER): Payer: Self-pay | Admitting: *Deleted

## 2015-11-20 NOTE — Progress Notes (Addendum)
NPO AFTER MN WITH EXCEPTION CLEAR LIQUIDS UNTIL 0800 (NO CREAM/ MILK PRODUCTS).  ARRIVE AT 1230.  NEEDS ISTAT AND EKG.  WILL TAKE AM MEDS AND PROTONIX ,W/ EXCEPTION NO INVOKONA, DOS W/ SIPS OF WATER.

## 2015-11-26 ENCOUNTER — Ambulatory Visit (HOSPITAL_COMMUNITY)
Admission: RE | Admit: 2015-11-26 | Discharge: 2015-11-26 | Disposition: A | Discharge status: Home / Self Care | Payer: 59 | Source: Ambulatory Visit | Attending: Orthopedic Surgery | Admitting: Orthopedic Surgery

## 2015-11-26 ENCOUNTER — Encounter (HOSPITAL_COMMUNITY): Payer: Self-pay | Admitting: *Deleted

## 2015-11-26 ENCOUNTER — Ambulatory Visit (HOSPITAL_COMMUNITY): Payer: 59 | Admitting: Certified Registered Nurse Anesthetist

## 2015-11-26 ENCOUNTER — Encounter (HOSPITAL_COMMUNITY)
Admission: RE | Disposition: A | Discharge status: Home / Self Care | Payer: Self-pay | Source: Ambulatory Visit | Attending: Orthopedic Surgery

## 2015-11-26 DIAGNOSIS — Z79899 Other long term (current) drug therapy: Secondary | ICD-10-CM | POA: Diagnosis not present

## 2015-11-26 DIAGNOSIS — Z6832 Body mass index (BMI) 32.0-32.9, adult: Secondary | ICD-10-CM | POA: Insufficient documentation

## 2015-11-26 DIAGNOSIS — E119 Type 2 diabetes mellitus without complications: Secondary | ICD-10-CM | POA: Insufficient documentation

## 2015-11-26 DIAGNOSIS — Z7984 Long term (current) use of oral hypoglycemic drugs: Secondary | ICD-10-CM | POA: Insufficient documentation

## 2015-11-26 DIAGNOSIS — E669 Obesity, unspecified: Secondary | ICD-10-CM | POA: Diagnosis not present

## 2015-11-26 DIAGNOSIS — K219 Gastro-esophageal reflux disease without esophagitis: Secondary | ICD-10-CM | POA: Insufficient documentation

## 2015-11-26 DIAGNOSIS — K589 Irritable bowel syndrome without diarrhea: Secondary | ICD-10-CM | POA: Insufficient documentation

## 2015-11-26 DIAGNOSIS — M65342 Trigger finger, left ring finger: Secondary | ICD-10-CM | POA: Insufficient documentation

## 2015-11-26 DIAGNOSIS — E785 Hyperlipidemia, unspecified: Secondary | ICD-10-CM | POA: Insufficient documentation

## 2015-11-26 DIAGNOSIS — G5602 Carpal tunnel syndrome, left upper limb: Secondary | ICD-10-CM | POA: Diagnosis not present

## 2015-11-26 DIAGNOSIS — G4733 Obstructive sleep apnea (adult) (pediatric): Secondary | ICD-10-CM | POA: Insufficient documentation

## 2015-11-26 DIAGNOSIS — I10 Essential (primary) hypertension: Secondary | ICD-10-CM | POA: Insufficient documentation

## 2015-11-26 DIAGNOSIS — F419 Anxiety disorder, unspecified: Secondary | ICD-10-CM | POA: Insufficient documentation

## 2015-11-26 DIAGNOSIS — Z87891 Personal history of nicotine dependence: Secondary | ICD-10-CM | POA: Insufficient documentation

## 2015-11-26 DIAGNOSIS — M65842 Other synovitis and tenosynovitis, left hand: Secondary | ICD-10-CM | POA: Diagnosis not present

## 2015-11-26 HISTORY — DX: Personal history of other diseases of the female genital tract: Z87.42

## 2015-11-26 HISTORY — DX: Trigger finger, unspecified finger: M65.30

## 2015-11-26 HISTORY — DX: Carpal tunnel syndrome, left upper limb: G56.02

## 2015-11-26 HISTORY — DX: Personal history of colonic polyps: Z86.010

## 2015-11-26 HISTORY — DX: Personal history of other diseases of the digestive system: Z87.19

## 2015-11-26 HISTORY — DX: Obstructive sleep apnea (adult) (pediatric): G47.33

## 2015-11-26 HISTORY — DX: Personal history of adenomatous and serrated colon polyps: Z86.0101

## 2015-11-26 HISTORY — DX: Presence of spectacles and contact lenses: Z97.3

## 2015-11-26 HISTORY — PX: CARPAL TUNNEL RELEASE: SHX101

## 2015-11-26 HISTORY — DX: Irritable bowel syndrome, unspecified: K58.9

## 2015-11-26 HISTORY — DX: Other specified postprocedural states: Z98.890

## 2015-11-26 HISTORY — DX: Gastro-esophageal reflux disease without esophagitis: K21.9

## 2015-11-26 HISTORY — DX: Nausea with vomiting, unspecified: R11.2

## 2015-11-26 HISTORY — DX: Solitary pulmonary nodule: R91.1

## 2015-11-26 HISTORY — PX: TRIGGER FINGER RELEASE: SHX641

## 2015-11-26 HISTORY — DX: Benign neoplasm of unspecified adrenal gland: D35.00

## 2015-11-26 HISTORY — DX: Type 2 diabetes mellitus without complications: E11.9

## 2015-11-26 LAB — GLUCOSE, CAPILLARY: Glucose-Capillary: 89 mg/dL (ref 65–99)

## 2015-11-26 LAB — POCT I-STAT 4, (NA,K, GLUC, HGB,HCT)
GLUCOSE: 107 mg/dL — AB (ref 65–99)
HCT: 41 % (ref 36.0–46.0)
Hemoglobin: 13.9 g/dL (ref 12.0–15.0)
POTASSIUM: 4 mmol/L (ref 3.5–5.1)
Sodium: 139 mmol/L (ref 135–145)

## 2015-11-26 SURGERY — CARPAL TUNNEL RELEASE
Anesthesia: Monitor Anesthesia Care | Site: Hand | Laterality: Left

## 2015-11-26 MED ORDER — LACTATED RINGERS IV SOLN
INTRAVENOUS | Status: DC
  Administered 2015-11-26 (×2): via INTRAVENOUS

## 2015-11-26 MED ORDER — BUPIVACAINE HCL (PF) 0.25 % IJ SOLN
INTRAMUSCULAR | Status: AC
  Filled 2015-11-26: qty 30

## 2015-11-26 MED ORDER — FENTANYL CITRATE (PF) 100 MCG/2ML IJ SOLN
INTRAMUSCULAR | Status: DC | PRN
  Administered 2015-11-26 (×2): 50 ug via INTRAVENOUS

## 2015-11-26 MED ORDER — DOCUSATE SODIUM 100 MG PO CAPS: 100.0000 mg | ORAL_CAPSULE | Freq: Two times a day (BID) | ORAL | Status: DC

## 2015-11-26 MED ORDER — LIDOCAINE HCL (PF) 1 % IJ SOLN
INTRAMUSCULAR | Status: AC
  Filled 2015-11-26: qty 30

## 2015-11-26 MED ORDER — BUPIVACAINE HCL (PF) 0.25 % IJ SOLN
INTRAMUSCULAR | Status: DC | PRN
  Administered 2015-11-26: 9 mL

## 2015-11-26 MED ORDER — PROPOFOL 10 MG/ML IV BOLUS
INTRAVENOUS | Status: AC
  Filled 2015-11-26: qty 20

## 2015-11-26 MED ORDER — 0.9 % SODIUM CHLORIDE (POUR BTL) OPTIME
TOPICAL | Status: DC | PRN
  Administered 2015-11-26: 1000 mL

## 2015-11-26 MED ORDER — LIDOCAINE HCL (CARDIAC) 20 MG/ML IV SOLN
INTRAVENOUS | Status: DC | PRN
  Administered 2015-11-26 (×2): 50 mg via INTRAVENOUS

## 2015-11-26 MED ORDER — EPHEDRINE SULFATE 50 MG/ML IJ SOLN
INTRAMUSCULAR | Status: DC | PRN
  Administered 2015-11-26: 10 mg via INTRAVENOUS

## 2015-11-26 MED ORDER — CEFAZOLIN SODIUM-DEXTROSE 2-3 GM-% IV SOLR
2.0000 g | INTRAVENOUS | Status: AC
  Administered 2015-11-26: 2 g via INTRAVENOUS
  Filled 2015-11-26: qty 50

## 2015-11-26 MED ORDER — LIDOCAINE HCL (PF) 1 % IJ SOLN
INTRAMUSCULAR | Status: DC | PRN
  Administered 2015-11-26: 9 mL

## 2015-11-26 MED ORDER — HYDROCODONE-ACETAMINOPHEN 5-300 MG PO TABS: 1.0000 | ORAL_TABLET | Freq: Four times a day (QID) | ORAL | Status: DC | PRN

## 2015-11-26 MED ORDER — ONDANSETRON HCL 4 MG/2ML IJ SOLN
INTRAMUSCULAR | Status: DC | PRN
  Administered 2015-11-26: 4 mg via INTRAVENOUS

## 2015-11-26 MED ORDER — MIDAZOLAM HCL 2 MG/2ML IJ SOLN
INTRAMUSCULAR | Status: AC
  Filled 2015-11-26: qty 2

## 2015-11-26 MED ORDER — FENTANYL CITRATE (PF) 250 MCG/5ML IJ SOLN
INTRAMUSCULAR | Status: AC
  Filled 2015-11-26: qty 5

## 2015-11-26 MED ORDER — GLYCOPYRROLATE 0.2 MG/ML IJ SOLN
INTRAMUSCULAR | Status: DC | PRN
  Administered 2015-11-26 (×2): 0.1 mg via INTRAVENOUS

## 2015-11-26 MED ORDER — CHLORHEXIDINE GLUCONATE 4 % EX LIQD: 60.0000 mL | Freq: Once | CUTANEOUS | Status: DC

## 2015-11-26 MED ORDER — DEXAMETHASONE SODIUM PHOSPHATE 10 MG/ML IJ SOLN
INTRAMUSCULAR | Status: DC | PRN
  Administered 2015-11-26: 10 mg via INTRAVENOUS

## 2015-11-26 MED ORDER — LACTATED RINGERS IV SOLN: INTRAVENOUS | Status: DC

## 2015-11-26 MED ORDER — PROPOFOL 500 MG/50ML IV EMUL
INTRAVENOUS | Status: DC | PRN
  Administered 2015-11-26: 75 ug/kg/min via INTRAVENOUS

## 2015-11-26 MED ORDER — MIDAZOLAM HCL 5 MG/5ML IJ SOLN
INTRAMUSCULAR | Status: DC | PRN
  Administered 2015-11-26: 2 mg via INTRAVENOUS

## 2015-11-26 MED FILL — DOK 100 MG SOFTGEL: 100 | 15 days supply | Qty: 30 | Fill #0

## 2015-11-26 SURGICAL SUPPLY — 45 items
BANDAGE ACE 3X5.8 VEL STRL LF (GAUZE/BANDAGES/DRESSINGS) ×2 IMPLANT
BANDAGE ELASTIC 3 VELCRO ST LF (GAUZE/BANDAGES/DRESSINGS) ×4 IMPLANT
BANDAGE ELASTIC 4 VELCRO ST LF (GAUZE/BANDAGES/DRESSINGS) ×2 IMPLANT
BNDG ESMARK 4X9 LF (GAUZE/BANDAGES/DRESSINGS) ×2 IMPLANT
BNDG GAUZE ELAST 4 BULKY (GAUZE/BANDAGES/DRESSINGS) ×2 IMPLANT
CORDS BIPOLAR (ELECTRODE) ×2 IMPLANT
COVER SURGICAL LIGHT HANDLE (MISCELLANEOUS) ×2 IMPLANT
CUFF TOURNIQUET SINGLE 18IN (TOURNIQUET CUFF) ×2 IMPLANT
CUFF TOURNIQUET SINGLE 24IN (TOURNIQUET CUFF) IMPLANT
DRAPE SURG 17X23 STRL (DRAPES) ×2 IMPLANT
EVACUATOR 1/8 PVC DRAIN (DRAIN) IMPLANT
GAUZE SPONGE 4X4 12PLY STRL (GAUZE/BANDAGES/DRESSINGS) ×2 IMPLANT
GAUZE XEROFORM 1X8 LF (GAUZE/BANDAGES/DRESSINGS) ×2 IMPLANT
GLOVE BIOGEL PI IND STRL 6.5 (GLOVE) ×1 IMPLANT
GLOVE BIOGEL PI IND STRL 8.5 (GLOVE) ×1 IMPLANT
GLOVE BIOGEL PI INDICATOR 6.5 (GLOVE) ×1
GLOVE BIOGEL PI INDICATOR 8.5 (GLOVE) ×1
GLOVE SURG ORTHO 8.0 STRL STRW (GLOVE) ×2 IMPLANT
GOWN STRL REUS W/ TWL LRG LVL3 (GOWN DISPOSABLE) ×2 IMPLANT
GOWN STRL REUS W/ TWL XL LVL3 (GOWN DISPOSABLE) ×1 IMPLANT
GOWN STRL REUS W/TWL LRG LVL3 (GOWN DISPOSABLE) ×2
GOWN STRL REUS W/TWL XL LVL3 (GOWN DISPOSABLE) ×1
KIT BASIN OR (CUSTOM PROCEDURE TRAY) ×2 IMPLANT
KIT ROOM TURNOVER OR (KITS) ×2 IMPLANT
LOOP VESSEL MAXI BLUE (MISCELLANEOUS) IMPLANT
NEEDLE HYPO 25GX1X1/2 BEV (NEEDLE) ×4 IMPLANT
NS IRRIG 1000ML POUR BTL (IV SOLUTION) ×2 IMPLANT
PACK ORTHO EXTREMITY (CUSTOM PROCEDURE TRAY) ×2 IMPLANT
PAD ARMBOARD 7.5X6 YLW CONV (MISCELLANEOUS) ×4 IMPLANT
PAD CAST 4YDX4 CTTN HI CHSV (CAST SUPPLIES) ×2 IMPLANT
PADDING CAST COTTON 4X4 STRL (CAST SUPPLIES) ×2
SOAP 2 % CHG 4 OZ (WOUND CARE) ×2 IMPLANT
SPONGE GAUZE 4X4 12PLY STER LF (GAUZE/BANDAGES/DRESSINGS) ×2 IMPLANT
SUCTION FRAZIER TIP 10 FR DISP (SUCTIONS) IMPLANT
SUT PROLENE 4 0 PS 2 18 (SUTURE) ×4 IMPLANT
SUT VIC AB 2-0 CT1 27 (SUTURE)
SUT VIC AB 2-0 CT1 TAPERPNT 27 (SUTURE) IMPLANT
SUT VIC AB 3-0 FS2 27 (SUTURE) IMPLANT
SYR CONTROL 10ML LL (SYRINGE) ×4 IMPLANT
SYSTEM CHEST DRAIN TLS 7FR (DRAIN) IMPLANT
TOWEL OR 17X24 6PK STRL BLUE (TOWEL DISPOSABLE) ×2 IMPLANT
TOWEL OR 17X26 10 PK STRL BLUE (TOWEL DISPOSABLE) ×2 IMPLANT
TUBE CONNECTING 12X1/4 (SUCTIONS) IMPLANT
UNDERPAD 30X30 INCONTINENT (UNDERPADS AND DIAPERS) ×2 IMPLANT
WATER STERILE IRR 1000ML POUR (IV SOLUTION) IMPLANT

## 2015-11-26 NOTE — Discharge Instructions (Signed)
KEEP BANDAGE CLEAN AND DRY °CALL OFFICE FOR F/U APPT [phone] in 14 days °Dr Mosi Hannold cell [phone] °KEEP HAND ELEVATED ABOVE HEART °OK TO APPLY ICE TO OPERATIVE AREA °CONTACT OFFICE IF ANY WORSENING PAIN OR CONCERNS. °

## 2015-11-26 NOTE — Brief Op Note (Signed)
11/26/2015  11:58 AM  PATIENT:  [name]  55 y.o. female  PRE-OPERATIVE DIAGNOSIS:  LEFT HAND CARPAL TUNNEL SYNDROME,LEFT RING FINGER TRIGGER  POST-OPERATIVE DIAGNOSIS:  * No post-op diagnosis entered *  PROCEDURE:  Procedure(s): CARPAL TUNNEL RELEASE (Left) RELEASE TRIGGER FINGER/A-1 PULLEY LEFT RING  FINGER (Left)  SURGEON:  Surgeon(s) and Role:    * Iran Planas, MD - Primary  PHYSICIAN ASSISTANT:   ASSISTANTS: NONE  ANESTHESIA:   general  EBL:     BLOOD ADMINISTERED:none  DRAINS: none   LOCAL MEDICATIONS USED:  MARCAINE     SPECIMEN:  No Specimen  DISPOSITION OF SPECIMEN:  N/A  COUNTS:  YES  TOURNIQUET:    DICTATION: [id number]  PLAN OF CARE: Discharge to home after PACU  PATIENT DISPOSITION:  PACU - hemodynamically stable.   Delay start of Pharmacological VTE agent (>24hrs) due to surgical blood loss or risk of bleeding: not applicable

## 2015-11-26 NOTE — H&P (Signed)
Alicia Neal is an 55 y.o. female.   Chief Complaint: left hand numbness and left ring finger trigger finger HPI: Pt followed in office Pt here for surgery  No prior surgery to left hand Pt concerned about numbness and tingling in hand and triggering and nodule of left ring finger  Past Medical History  Diagnosis Date  . Hypertension   . Hyperlipidemia   . Allergic rhinitis   . Anxiety disorder   . Chronic depression     Dr. Cheryln Manly  . Pemphigoid, cicatricial     pruritic autoimmune blistering skin disorder  . History of adenomatous polyp of colon   . History of abnormal cervical Pap smear   . Type 2 diabetes mellitus ([place])   . Adrenal adenoma     right side  . Carpal tunnel syndrome of left wrist   . Pulmonary nodule, right pulmologist-  dr Nathaneil Canary mcquaid    x2    per CT 09-11-2015  . Trigger finger, left     ring finger  . GERD (gastroesophageal reflux disease)   . History of hiatal hernia   . IBS (irritable bowel syndrome)   . Wears glasses   . OSA (obstructive sleep apnea)     per pt study yrs ago-- CPAP intolerant   . PONV (postoperative nausea and vomiting)     Past Surgical History  Procedure Laterality Date  . Cholecystectomy  1989  . Tubal ligation  2000  . Cardiovascular stress test  04-30-2003    Small focus of decreased perfusion on rest images in the mid-distal anterior wall (worrisome for ischemia),  normal LV function and wall motion , ef 60%  . Transthoracic echocardiogram  04-30-2003    mild hypokinetic in the inferior and posterior wall, ef 50%/  trivial MR /  mild TR    Family History  Problem Relation Age of Onset  . Lung cancer Father   . Diabetes Father   . Cancer Father     lung  . Arthritis Mother   . Cancer Brother     skin  . Cancer Maternal Grandmother     colon and vaginal  . Cancer Maternal Grandfather     colon  . COPD Maternal Grandfather   . Hypertension Paternal Grandmother   . Stroke Paternal Grandmother    Social  History:  reports that she quit smoking about 2 years ago. Her smoking use included Cigarettes. She has a 40 pack-year smoking history. She has never used smokeless tobacco. She reports that she drinks alcohol. She reports that she does not use illicit drugs.  Allergies:  Allergies  Allergen Reactions  . Ezetimibe-Simvastatin Other (See Comments)    REACTION: muscle aches  . Pravachol [Pravastatin Sodium] Other (See Comments)    Joint pains and memory loss  . Simvastatin Other (See Comments)    REACTION: muscle aches  . Zetia [Ezetimibe] Other (See Comments)    Generalized joint pains    Medications Prior to Admission  Medication Sig Dispense Refill  . canagliflozin (INVOKANA) 300 MG TABS tablet Take 300 mg by mouth daily before breakfast. 90 tablet 1  . citalopram (CELEXA) 10 MG tablet Take 1 tablet (10 mg total) by mouth daily. (Patient taking differently: Take 10 mg by mouth every evening. ) 90 tablet 1  . ezetimibe (ZETIA) 10 MG tablet Take 1 tablet (10 mg total) by mouth daily. (Patient taking differently: Take 10 mg by mouth every morning. ) 90 tablet 1  . fluticasone (FLONASE)  50 MCG/ACT nasal spray Place 2 sprays into both nostrils daily. (Patient taking differently: Place 2 sprays into both nostrils daily as needed for allergies. ) 16 g 3  . JANUMET 50-1000 MG per tablet TAKE 1 TABLET BY MOUTH once daily (Patient taking differently: Take 1 tablet by mouth. TAKE 1 TABLET BY MOUTH once daily -- takes in pm--  Pt states will take in am if CBG is >120) 90 tablet 1  . losartan (COZAAR) 25 MG tablet Take 1 tablet (25 mg total) by mouth daily. (Patient taking differently: Take 25 mg by mouth every morning. ) 90 tablet 1  . mycophenolate (CELLCEPT) 500 MG tablet Take 1,500 mg by mouth 2 (two) times daily. 3 tabs twice a day    . pantoprazole (PROTONIX) 40 MG tablet Take 40 mg by mouth as needed.    . temazepam (RESTORIL) 7.5 MG capsule TAKE 1 CAPSULE BY MOUTH DAILY AT BEDTIME AS NEEDED FOR  SLEEP 30 capsule 3  . Vitamin D, Ergocalciferol, (DRISDOL) 50000 UNITS CAPS capsule TAKE 1 CAPSULE BY MOUTH ONCE A WEEK. 12 capsule PRN    Results for orders placed or performed during the hospital encounter of 11/26/15 (from the past 48 hour(s))  I-STAT 4, (NA,K, GLUC, HGB,HCT)     Status: Abnormal   Collection Time: 11/26/15 10:45 AM  Result Value Ref Range   Sodium 139 135 - 145 mmol/L   Potassium 4.0 3.5 - 5.1 mmol/L   Glucose, Bld 107 (H) 65 - 99 mg/dL   HCT 41.0 36.0 - 46.0 %   Hemoglobin 13.9 12.0 - 15.0 g/dL   No results found.  ROS NO RECENT ILLNESSES OR HOSPITALIZATIONS  Blood pressure 123/73, pulse 60, temperature 98.2 F (36.8 C), temperature source Oral, resp. rate 20, height 5\' 2"  (1.575 m), weight 79.379 kg (175 lb), SpO2 99 %. Physical Exam  General Appearance:  Alert, cooperative, no distress, appears stated age  Head:  Normocephalic, without obvious abnormality, atraumatic  Eyes:  Pupils equal, conjunctiva/corneas clear,         Throat: Lips, mucosa, and tongue normal; teeth and gums normal  Neck: No visible masses     Lungs:   respirations unlabored  Chest Wall:  No tenderness or deformity  Heart:  Regular rate and rhythm,  Abdomen:   Soft, non-tender,         Extremities: LEFT HAND: ABLE TO PALMAR ABDUCT THUMB +COMPRESSION TEST OVER MEDIAN NERVE GOOD DIGITAL MOTION FINGERS WARM WELL PERFUSED +TRIGGERING TO RING FINGER  Pulses: 2+ and symmetric  Skin: Skin color, texture, turgor normal, no rashes or lesions     Neurologic: Normal   Assessment/Plan LEFT HAND CARPAL TUNNEL SYNDROME, LEFT RING FINGER STENOSING TENOSYNOVITIS, TRIGGERING  LEFT HAND CARPAL TUNNEL RELEASE, LEFT RING FINGER A1 PULLEY RELEASE AND NODULE EXCISION  R/B/A DISCUSSED WITH PT IN OFFICE.  PT VOICED UNDERSTANDING OF PLAN CONSENT SIGNED DAY OF SURGERY PT SEEN AND EXAMINED PRIOR TO OPERATIVE PROCEDURE/DAY OF SURGERY SITE MARKED. QUESTIONS ANSWERED WILL GO HOME FOLLOWING  SURGERY  WE ARE PLANNING SURGERY FOR YOUR UPPER EXTREMITY. THE RISKS AND BENEFITS OF SURGERY INCLUDE BUT NOT LIMITED TO BLEEDING INFECTION, DAMAGE TO NEARBY NERVES ARTERIES TENDONS, FAILURE OF SURGERY TO ACCOMPLISH ITS INTENDED GOALS, PERSISTENT SYMPTOMS AND NEED FOR FURTHER SURGICAL INTERVENTION. WITH THIS IN MIND WE WILL PROCEED. I HAVE DISCUSSED WITH THE PATIENT THE PRE AND POSTOPERATIVE REGIMEN AND THE DOS AND DON'TS. PT VOICED UNDERSTANDING AND INFORMED CONSENT SIGNED.  Linna Hoff 11/26/2015, 11:51 AM

## 2015-11-26 NOTE — Op Note (Signed)
NAMEINDIRA, WESBY              ACCOUNT NO.:  [id number]  MEDICAL RECORD NO.:  [id number]  LOCATION:  [facility]                         FACILITY:  [facility]  PHYSICIAN:  Linna Hoff IV, M.D.DATE OF BIRTH:  1961-05-19  DATE OF PROCEDURE:  11/26/2015 DATE OF DISCHARGE:  11/26/2015                              OPERATIVE REPORT   PREOPERATIVE DIAGNOSES: 1. Left hand carpal tunnel syndrome. 2. Left ring finger stenosing tenosynovitis.  POSTOPERATIVE DIAGNOSES: 1. Left hand carpal tunnel syndrome. 2. Left ring finger stenosing tenosynovitis.  SURGEON:  Linna Hoff, M.D., who scrubbed and present for the entire procedure.  ASSISTANT SURGEON:  None.  ANESTHESIA:  General via LMA.  PROCEDURE: 1. Left hand carpal tunnel release. 2. Left ring finger tenosynovectomy, flexor digitorum superficialis. 3. Left ring finger tenosynovectomy, flexor digitorum profundus.  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  It was with interligamentous.  This was carefully dissected out and protected. Anatomical variant was noted.  SURGICAL INDICATIONS:  Ms. Almeda is a right-hand-dominant female with signs and symptoms consistent with left hand carpal tunnel syndrome and left ring finger stenosing tenosynovitis.  The patient elected to undergo the above procedure.  Risks, benefits, and alternatives were discussed in detail with the patient and signed informed consent was obtained.  Risks include, but not limited to bleeding; infection; damage to nearby nerves, arteries, or tendons; loss of motion of wrist and digits; incomplete relief of symptoms; need for further surgical intervention.  DESCRIPTION OF PROCEDURE:  The patient was properly identified in the preop holding area, marked with a permanent marker made on left hand to indicate the correct operative site.  The patient was brought back to the operating room,  placed supine on the anesthesia table, where general anesthesia was administered.  We tried to do this under local MAC, but the patient was unable to sit still, therefore LMA was introduced.  A well-padded tourniquet was placed on the left forearm and sealed with 1000 drape.  Left upper extremity was prepped and draped in normal sterile fashion.  Time-out was called, correct side was identified, and procedure then begun.  Attention then turned to the left hand.  Several centimeters incision was made directly in the mid palm.  Tourniquet was insufflated.  Dissection carried down through the skin and subcutaneous tissues.  The dissection carried down through the palmar fascia.  The transligamentous branch was a recurrent branch, median nerve was then carefully identified.  The median nerve was protected throughout.  It was released both proximally and distally.  No other abnormalities were noted.  The wound was then thoroughly irrigated.  Copious wound irrigation done.  The skin was then closed using horizontal mattress Prolene sutures.  Attention was then turned to the ring finger, where an oblique incision was made directly over the A1 pulley.  Dissection was carried down through the skin and subcutaneous tissues.  Deep dissection was carried down to the A1 pulley which was released.  The patient did have a large amount of inflammatory changes along the FDS and tenosynovectomy was then carried out in the palm extending into the finger.  This was also done through the FDP.  The wound was then thoroughly irrigated after tenosynovectomy of both tendons.  The skin was then closed using horizontal mattress Prolene sutures.  Xeroform dressing and sterile compressive bandage were applied.  The patient tolerated the procedure well.  Returned to recovery room in good condition.  POSTPROCEDURE PLAN:  The patient discharged home, seen back in the office in approximately 2 weeks for wound check,  suture removal, and gradual use and activity and begin a postoperative carpal tunnel evaluation and treatment with GelFlex glove.     Melrose Nakayama, M.D.     FWO/MEDQ  D:  11/26/2015  T:  11/26/2015  Job:  [id number]

## 2015-11-26 NOTE — Anesthesia Postprocedure Evaluation (Signed)
Anesthesia Post Note  Patient: [name]  Procedure(s) Performed: Procedure(s) (LRB): CARPAL TUNNEL RELEASE (Left) RELEASE TRIGGER FINGER/A-1 PULLEY LEFT RING  FINGER (Left)  Patient location during evaluation: PACU Anesthesia Type: General Level of consciousness: awake and alert Pain management: pain level controlled Vital Signs Assessment: post-procedure vital signs reviewed and stable Respiratory status: spontaneous breathing, nonlabored ventilation, respiratory function stable and patient connected to nasal cannula oxygen Cardiovascular status: blood pressure returned to baseline and stable Postop Assessment: no signs of nausea or vomiting Anesthetic complications: no    Last Vitals:  Filed Vitals:   11/26/15 1430 11/26/15 1448  BP: 122/75 119/61  Pulse: 65 62  Temp: 36.1 C   Resp:  17    Last Pain:  Filed Vitals:   11/26/15 1504  PainSc: 0-No pain                 Keatin Benham,JAMES TERRILL

## 2015-11-26 NOTE — Anesthesia Preprocedure Evaluation (Signed)
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  Reviewed: Allergy & Precautions, NPO status , Patient's Chart, lab work & pertinent test results  History of Anesthesia Complications (+) PONV  Airway Mallampati: II  TM Distance: >3 FB Neck ROM: Full    Dental  (+) Teeth Intact   Pulmonary sleep apnea , former smoker,    breath sounds clear to auscultation       Cardiovascular hypertension,  Rhythm:Regular Rate:Normal     Neuro/Psych    GI/Hepatic hiatal hernia, GERD  ,  Endo/Other  diabetes  Renal/GU      Musculoskeletal   Abdominal (+) + obese,   Peds  Hematology   Anesthesia Other Findings   Reproductive/Obstetrics                             Anesthesia Physical Anesthesia Plan  ASA: II  Anesthesia Plan: MAC   Post-op Pain Management:    Induction: Intravenous  Airway Management Planned: Natural Airway  Additional Equipment:   Intra-op Plan:   Post-operative Plan:   Informed Consent: I have reviewed the patients History and Physical, chart, labs and discussed the procedure including the risks, benefits and alternatives for the proposed anesthesia with the patient or authorized representative who has indicated his/her understanding and acceptance.     Plan Discussed with: CRNA and Surgeon  Anesthesia Plan Comments:         Anesthesia Quick Evaluation

## 2015-11-26 NOTE — Anesthesia Procedure Notes (Signed)
Procedure Name: LMA Insertion Performed by: Judeth Cornfield T Pre-anesthesia Checklist: Patient identified, Emergency Drugs available, Suction available, Patient being monitored and Timeout performed Patient Re-evaluated:Patient Re-evaluated prior to inductionOxygen Delivery Method: Circle system utilized Preoxygenation: Pre-oxygenation with 100% oxygen Intubation Type: IV induction Ventilation: Mask ventilation without difficulty LMA: LMA inserted LMA Size: 4.0 Number of attempts: 1 Placement Confirmation: positive ETCO2,  breath sounds checked- equal and bilateral and CO2 detector Tube secured with: Tape Dental Injury: Teeth and Oropharynx as per pre-operative assessment

## 2015-11-26 NOTE — Transfer of Care (Signed)
Immediate Anesthesia Transfer of Care Note  Patient: Alicia Neal  Procedure(s) Performed: Procedure(s): CARPAL TUNNEL RELEASE (Left) RELEASE TRIGGER FINGER/A-1 PULLEY LEFT RING  FINGER (Left)  Patient Location: PACU  Anesthesia Type:MAC and General  Level of Consciousness: awake, alert , oriented, patient cooperative and responds to stimulation  Airway & Oxygen Therapy: Patient Spontanous Breathing and Patient connected to nasal cannula oxygen  Post-op Assessment: Report given to RN and Post -op Vital signs reviewed and stable  Post vital signs: Reviewed and stable  Last Vitals:  Filed Vitals:   11/26/15 1013  BP: 123/73  Pulse: 60  Temp: 36.8 C  Resp: 20    Complications: No apparent anesthesia complications

## 2015-11-27 ENCOUNTER — Encounter (HOSPITAL_COMMUNITY): Payer: Self-pay | Admitting: Orthopedic Surgery

## 2015-12-04 ENCOUNTER — Other Ambulatory Visit: Payer: Self-pay | Admitting: Family Medicine

## 2015-12-04 MED FILL — INVOKANA 300 MG TABLET: 300 | 90 days supply | Qty: 90 | Fill #0

## 2015-12-07 ENCOUNTER — Other Ambulatory Visit: Payer: Self-pay | Admitting: Family Medicine

## 2015-12-07 MED FILL — VIT D2 1.25 MG (50,000 UNIT: 1.25 MG | 84 days supply | Qty: 12 | Fill #0

## 2015-12-10 DIAGNOSIS — M65342 Trigger finger, left ring finger: Secondary | ICD-10-CM | POA: Diagnosis not present

## 2015-12-10 DIAGNOSIS — Z4789 Encounter for other orthopedic aftercare: Secondary | ICD-10-CM | POA: Diagnosis not present

## 2015-12-10 DIAGNOSIS — G5602 Carpal tunnel syndrome, left upper limb: Secondary | ICD-10-CM | POA: Diagnosis not present

## 2015-12-30 MED FILL — TEMAZEPAM 7.5 MG CAPSULE: 7.5 | 30 days supply | Qty: 30 | Fill #1

## 2016-01-15 ENCOUNTER — Telehealth: Payer: Self-pay

## 2016-01-15 NOTE — Telephone Encounter (Signed)
States she hasn't been feeling good and her lungs hurt when she breathes in. Wanted to be seen today but advised Dr wasn't in the office. Advised patient to be seen and the urgent care or the ER and she said it if worsens then she would

## 2016-01-18 ENCOUNTER — Other Ambulatory Visit (HOSPITAL_COMMUNITY)
Admission: RE | Admit: 2016-01-18 | Discharge: 2016-01-18 | Disposition: A | Discharge status: Home / Self Care | Payer: 59 | Source: Ambulatory Visit | Attending: Dermatology | Admitting: Dermatology

## 2016-01-18 DIAGNOSIS — Z5181 Encounter for therapeutic drug level monitoring: Secondary | ICD-10-CM | POA: Insufficient documentation

## 2016-01-18 LAB — CBC WITH DIFFERENTIAL/PLATELET
BASOS ABS: 0.1 10*3/uL (ref 0.0–0.1)
Basophils Relative: 1 %
EOS PCT: 5 %
Eosinophils Absolute: 0.4 10*3/uL (ref 0.0–0.7)
HEMATOCRIT: 41.3 % (ref 36.0–46.0)
Hemoglobin: 13.3 g/dL (ref 12.0–15.0)
LYMPHS ABS: 1.7 10*3/uL (ref 0.7–4.0)
LYMPHS PCT: 22 %
MCH: 29 pg (ref 26.0–34.0)
MCHC: 32.2 g/dL (ref 30.0–36.0)
MCV: 90.2 fL (ref 78.0–100.0)
MONO ABS: 0.4 10*3/uL (ref 0.1–1.0)
MONOS PCT: 5 %
NEUTROS ABS: 5.4 10*3/uL (ref 1.7–7.7)
Neutrophils Relative %: 67 %
Platelets: 355 10*3/uL (ref 150–400)
RBC: 4.58 MIL/uL (ref 3.87–5.11)
RDW: 13.2 % (ref 11.5–15.5)
WBC: 8 10*3/uL (ref 4.0–10.5)

## 2016-01-18 MED FILL — MYCOPHENOLATE 500 MG TABLET: 500 | 90 days supply | Qty: 540 | Fill #0

## 2016-01-18 MED FILL — LOSARTAN POTASSIUM 50 MG TA: 50 | 90 days supply | Qty: 90 | Fill #1

## 2016-01-21 DIAGNOSIS — Z5181 Encounter for therapeutic drug level monitoring: Secondary | ICD-10-CM | POA: Diagnosis not present

## 2016-01-21 DIAGNOSIS — L121 Cicatricial pemphigoid: Secondary | ICD-10-CM | POA: Diagnosis not present

## 2016-01-25 MED FILL — JANUMET 50-1,000 MG TABLET: 50-1000 | 90 days supply | Qty: 180 | Fill #3

## 2016-01-27 ENCOUNTER — Other Ambulatory Visit: Payer: Self-pay | Admitting: Acute Care

## 2016-01-27 DIAGNOSIS — Z87891 Personal history of nicotine dependence: Secondary | ICD-10-CM

## 2016-02-16 ENCOUNTER — Other Ambulatory Visit (HOSPITAL_COMMUNITY)
Admission: RE | Admit: 2016-02-16 | Discharge: 2016-02-16 | Disposition: A | Discharge status: Home / Self Care | Payer: 59 | Source: Ambulatory Visit | Attending: Family Medicine | Admitting: Family Medicine

## 2016-02-16 DIAGNOSIS — E119 Type 2 diabetes mellitus without complications: Secondary | ICD-10-CM | POA: Diagnosis not present

## 2016-02-16 DIAGNOSIS — E559 Vitamin D deficiency, unspecified: Secondary | ICD-10-CM | POA: Insufficient documentation

## 2016-02-16 DIAGNOSIS — E785 Hyperlipidemia, unspecified: Secondary | ICD-10-CM | POA: Insufficient documentation

## 2016-02-16 DIAGNOSIS — I10 Essential (primary) hypertension: Secondary | ICD-10-CM | POA: Insufficient documentation

## 2016-02-16 LAB — LIPID PANEL
Cholesterol: 299 mg/dL — ABNORMAL HIGH (ref 0–200)
HDL: 54 mg/dL (ref >40)
LDL CALC: 208 mg/dL — AB (ref 0–99)
Total CHOL/HDL Ratio: 5.5 RATIO
Triglycerides: 184 mg/dL — ABNORMAL HIGH (ref <150)
VLDL: 37 mg/dL (ref 0–40)

## 2016-02-16 LAB — CBC
HCT: 40.9 % (ref 36.0–46.0)
HEMOGLOBIN: 13.4 g/dL (ref 12.0–15.0)
MCH: 29 pg (ref 26.0–34.0)
MCHC: 32.8 g/dL (ref 30.0–36.0)
MCV: 88.5 fL (ref 78.0–100.0)
PLATELETS: 371 10*3/uL (ref 150–400)
RBC: 4.62 MIL/uL (ref 3.87–5.11)
RDW: 13.4 % (ref 11.5–15.5)
WBC: 8.4 10*3/uL (ref 4.0–10.5)

## 2016-02-16 LAB — COMPREHENSIVE METABOLIC PANEL
ALK PHOS: 63 U/L (ref 38–126)
ALT: 21 U/L (ref 14–54)
AST: 20 U/L (ref 15–41)
Albumin: 4.4 g/dL (ref 3.5–5.0)
Anion gap: 11 (ref 5–15)
BUN: 15 mg/dL (ref 6–20)
CHLORIDE: 104 mmol/L (ref 101–111)
CO2: 21 mmol/L — AB (ref 22–32)
Calcium: 9.1 mg/dL (ref 8.9–10.3)
Creatinine, Ser: 0.76 mg/dL (ref 0.44–1.00)
GFR calc non Af Amer: >60 mL/min (ref >60)
GLUCOSE: 119 mg/dL — AB (ref 65–99)
Potassium: 4 mmol/L (ref 3.5–5.1)
SODIUM: 136 mmol/L (ref 135–145)
Total Bilirubin: 0.4 mg/dL (ref 0.3–1.2)
Total Protein: 7.2 g/dL (ref 6.5–8.1)

## 2016-02-17 LAB — HEMOGLOBIN A1C
Hgb A1c MFr Bld: 6.8 % — ABNORMAL HIGH (ref 4.8–5.6)
Mean Plasma Glucose: 148 mg/dL

## 2016-02-17 LAB — VITAMIN D 25 HYDROXY (VIT D DEFICIENCY, FRACTURES): VIT D 25 HYDROXY: 30.8 ng/mL (ref 30.0–100.0)

## 2016-02-22 MED FILL — TEMAZEPAM 7.5 MG CAPSULE: 7.5 | 30 days supply | Qty: 30 | Fill #2

## 2016-02-24 ENCOUNTER — Ambulatory Visit (INDEPENDENT_AMBULATORY_CARE_PROVIDER_SITE_OTHER): Payer: 59 | Admitting: Family Medicine

## 2016-02-24 ENCOUNTER — Encounter: Payer: Self-pay | Admitting: Family Medicine

## 2016-02-24 VITALS — BP 120/82 | HR 57 | Resp 16 | Ht 62.0 in | Wt 180.0 lb

## 2016-02-24 DIAGNOSIS — G5602 Carpal tunnel syndrome, left upper limb: Secondary | ICD-10-CM

## 2016-02-24 DIAGNOSIS — E785 Hyperlipidemia, unspecified: Secondary | ICD-10-CM | POA: Diagnosis not present

## 2016-02-24 DIAGNOSIS — E119 Type 2 diabetes mellitus without complications: Secondary | ICD-10-CM | POA: Diagnosis not present

## 2016-02-24 DIAGNOSIS — I10 Essential (primary) hypertension: Secondary | ICD-10-CM | POA: Diagnosis not present

## 2016-02-24 DIAGNOSIS — G4700 Insomnia, unspecified: Secondary | ICD-10-CM

## 2016-02-24 DIAGNOSIS — E669 Obesity, unspecified: Secondary | ICD-10-CM | POA: Diagnosis not present

## 2016-02-24 DIAGNOSIS — F411 Generalized anxiety disorder: Secondary | ICD-10-CM

## 2016-02-24 DIAGNOSIS — E559 Vitamin D deficiency, unspecified: Secondary | ICD-10-CM

## 2016-02-24 DIAGNOSIS — L121 Cicatricial pemphigoid: Secondary | ICD-10-CM

## 2016-02-24 MED ORDER — EZETIMIBE 10 MG PO TABS: 10.0000 mg | ORAL_TABLET | Freq: Every day | ORAL | Status: DC

## 2016-02-24 MED FILL — EZETIMIBE 10 MG TABLET: 10 | 90 days supply | Qty: 90 | Fill #0

## 2016-02-24 NOTE — Assessment & Plan Note (Signed)
Controlled, no change in medication DASH diet and commitment to daily physical activity for a minimum of 30 minutes discussed and encouraged, as a part of hypertension management. The importance of attaining a healthy weight is also discussed.  BP/Weight 02/24/2016 11/26/2015 10/22/2015 06/18/2015 02/17/2015 [id number]  Systolic BP 123456 123456 [id number] 123456 [id number]  Diastolic BP 82 61 82 74 82 82 89  Wt. (Lbs) 180 175 173 170 176 183 190  BMI 32.91 32 31.63 31.09 32.18 33.46 34.74

## 2016-02-24 NOTE — Assessment & Plan Note (Addendum)
Successful surgery , pain free, surgery on 11/21/2015

## 2016-02-24 NOTE — Assessment & Plan Note (Signed)
Controlled, no change in medication  

## 2016-02-24 NOTE — Assessment & Plan Note (Signed)
Deteriorated, needs to start zetia and change diet Hyperlipidemia:Low fat diet discussed and encouraged.   Lipid Panel  Lab Results  Component Value Date   CHOL 299* 02/16/2016   HDL 54 02/16/2016   LDLCALC 208* 02/16/2016   TRIG 184* 02/16/2016   CHOLHDL 5.5 02/16/2016      Updated lab needed at/ before next visit.

## 2016-02-24 NOTE — Assessment & Plan Note (Signed)
Controlled, no change in medication Alicia Neal is reminded of the importance of commitment to daily physical activity for 30 minutes or more, as able and the need to limit carbohydrate intake to 30 to 60 grams per meal to help with blood sugar control.   The need to take medication as prescribed, test blood sugar as directed, and to call between visits if there is a concern that blood sugar is uncontrolled is also discussed.   Alicia Neal is reminded of the importance of daily foot exam, annual eye examination, and good blood sugar, blood pressure and cholesterol control.  Diabetic Labs Latest Ref Rng 02/16/2016 10/26/2015 10/14/2015 06/08/2015 02/11/2015  HbA1c 4.8 - 5.6 % 6.8(H) - 6.5(H) 6.3(H) 6.9(H)  Microalbumin Not estab mg/dL - 0.4 - - -  Micro/Creat Ratio <30 mcg/mg creat - 6 - - -  Chol 0 - 200 mg/dL 299(H) - 272(H) 236(H) 230(H)  HDL >40 mg/dL 54 - 56 52 56  Calc LDL 0 - 99 mg/dL 208(H) - 176(H) 152(H) 145(H)  Triglycerides <150 mg/dL 184(H) - 199(H) 158(H) 143  Creatinine 0.44 - 1.00 mg/dL 0.76 - 0.68 0.72 0.66   BP/Weight 02/24/2016 11/26/2015 10/22/2015 06/18/2015 02/17/2015 [id number]  Systolic BP 123456 123456 [id number] 123456 [id number]  Diastolic BP 82 61 82 74 82 82 89  Wt. (Lbs) 180 175 173 170 176 183 190  BMI 32.91 32 31.63 31.09 32.18 33.46 34.74   Foot/eye exam completion dates Latest Ref Rng 10/22/2015 02/13/2015  Eye Exam No Retinopathy - No Retinopathy  Foot Form Completion - Done -

## 2016-02-24 NOTE — Assessment & Plan Note (Signed)
Deteriorated. Patient re-educated about  the importance of commitment to a  minimum of 150 minutes of exercise per week.  The importance of healthy food choices with portion control discussed. Encouraged to start a food diary, count calories and to consider  joining a support group. Sample diet sheets offered. Goals set by the patient for the next several months.   Weight /BMI 02/24/2016 11/26/2015 10/22/2015  WEIGHT 180 lb 175 lb 173 lb  HEIGHT 5\' 2"  5\' 2"  5\' 2"   BMI 32.91 kg/m2 32 kg/m2 31.63 kg/m2    Current exercise per week over 200 minutes.

## 2016-02-24 NOTE — Patient Instructions (Signed)
F/u in 3 months, call if you need me before  Start zetia again, and change food environment    Fasting lipid, cmp and EGFR, and HBA1C in 3 months  Congrats on daily exercise and nicotine free   Thank you  for choosing [facility]. We consider it a privelige to serve you.  Delivering excellent health care in a caring and  compassionate way is our goal.  Partnering with you,  so that together we can achieve this goal is our strategy.

## 2016-02-24 NOTE — Assessment & Plan Note (Signed)
Controlled, no change in medication No flare in past 4 month, followed in [place]

## 2016-02-24 NOTE — Assessment & Plan Note (Signed)
Corrected needs to continue weekly supplement

## 2016-02-24 NOTE — Assessment & Plan Note (Signed)
Improved, using chamomilee tea more often, and restoril less often, which is  good

## 2016-02-24 NOTE — Progress Notes (Signed)
Subjective:    Patient ID: Alicia Neal, female    DOB: August 06, 1961, 55 y.o.   MRN: [id number]  HPI   Alicia Neal     MRN: [id number]      DOB: 04-17-1961   HPI Alicia Neal is here for follow up and re-evaluation of chronic medical conditions, medication management and review of any available recent lab and radiology data.  Preventive health is updated, specifically  Cancer screening and Immunization.   Questions or concerns regarding consultations or procedures which the PT has had in the interim are  addressed. The PT denies any adverse reactions to current medications since the last visit.  Has not been taking zetia, states that the pharmacy told her that none had been sent in, and has been overindulging in sweets , deserts etc since Christmas Still exercising and still nicotine free Not depressed ROS Denies recent fever or chills. Denies sinus pressure, nasal congestion, ear pain or sore throat. Denies chest congestion, productive cough or wheezing. Denies chest pains, palpitations and leg swelling Denies abdominal pain, nausea, vomiting,diarrhea or constipation.   Denies dysuria, frequency, hesitancy or incontinence. Denies joint pain, swelling and limitation in mobility. Denies headaches, seizures, numbness, or tingling. Denies depression, anxiety or insomnia. Denies skin break down or rash.   PE  BP 120/82 mmHg  Pulse 57  Resp 16  Ht 5\' 2"  (1.575 m)  Wt 180 lb (81.647 kg)  BMI 32.91 kg/m2  SpO2 97%  Patient alert and oriented and in no cardiopulmonary distress.  HEENT: No facial asymmetry, EOMI,   oropharynx pink and moist.  Neck supple no JVD, no mass.  Chest: Clear to auscultation bilaterally.  CVS: S1, S2 no murmurs, no S3.Regular rate.  ABD: Soft non tender.   Ext: No edema  MS: Adequate ROM spine, shoulders, hips and knees.  Skin: Intact, no ulcerations or rash noted.  Psych: Good eye contact, normal affect. Memory intact not anxious or  depressed appearing.  CNS: CN 2-12 intact, power,  normal throughout.no focal deficits noted.   Assessment & Plan  HTN, goal below 130/80 Controlled, no change in medication DASH diet and commitment to daily physical activity for a minimum of 30 minutes discussed and encouraged, as a part of hypertension management. The importance of attaining a healthy weight is also discussed.  BP/Weight 02/24/2016 11/26/2015 10/22/2015 06/18/2015 02/17/2015 [id number]  Systolic BP 123456 123456 [id number] 123456 [id number]  Diastolic BP 82 61 82 74 82 82 89  Wt. (Lbs) 180 175 173 170 176 183 190  BMI 32.91 32 31.63 31.09 32.18 33.46 34.74        Hyperlipidemia LDL goal <100 Deteriorated, needs to start zetia and change diet Hyperlipidemia:Low fat diet discussed and encouraged.   Lipid Panel  Lab Results  Component Value Date   CHOL 299* 02/16/2016   HDL 54 02/16/2016   LDLCALC 208* 02/16/2016   TRIG 184* 02/16/2016   CHOLHDL 5.5 02/16/2016      Updated lab needed at/ before next visit.   Obesity (BMI 30.0-34.9) Deteriorated. Patient re-educated about  the importance of commitment to a  minimum of 150 minutes of exercise per week.  The importance of healthy food choices with portion control discussed. Encouraged to start a food diary, count calories and to consider  joining a support group. Sample diet sheets offered. Goals set by the patient for the next several months.   Weight /BMI 02/24/2016 11/26/2015 10/22/2015  WEIGHT 180 lb 175 lb 173  lb  HEIGHT 5\' 2"  5\' 2"  5\' 2"   BMI 32.91 kg/m2 32 kg/m2 31.63 kg/m2    Current exercise per week over 200 minutes.   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   The need to take medication as prescribed, test blood sugar as directed, and to call between visits if there is a concern that blood sugar is uncontrolled is also discussed.   Alicia Neal is reminded of the importance of daily foot exam, annual eye examination, and good blood sugar, blood pressure and cholesterol control.  Diabetic Labs Latest Ref Rng 02/16/2016 10/26/2015 10/14/2015 06/08/2015 02/11/2015  HbA1c 4.8 - 5.6 % 6.8(H) - 6.5(H) 6.3(H) 6.9(H)  Microalbumin Not estab mg/dL - 0.4 - - -  Micro/Creat Ratio <30 mcg/mg creat - 6 - - -  Chol 0 - 200 mg/dL 299(H) - 272(H) 236(H) 230(H)  HDL >40 mg/dL 54 - 56 52 56  Calc LDL 0 - 99 mg/dL 208(H) - 176(H) 152(H) 145(H)  Triglycerides <150 mg/dL 184(H) - 199(H) 158(H) 143  Creatinine 0.44 - 1.00 mg/dL 0.76 - 0.68 0.72 0.66   BP/Weight 02/24/2016 11/26/2015 10/22/2015 06/18/2015 02/17/2015 [id number]  Systolic BP 123456 123456 [id number] 123456 [id number]  Diastolic BP 82 61 82 74 82 82 89  Wt. (Lbs) 180 175 173 170 176 183 190  BMI 32.91 32 31.63 31.09 32.18 33.46 34.74   Foot/eye exam completion dates Latest Ref Rng 10/22/2015 02/13/2015  Eye Exam No Retinopathy - No Retinopathy  Foot Form Completion - Done -             Review of Systems     Objective:   Physical Exam        Assessment & Plan:

## 2016-03-02 MED FILL — INVOKANA 300 MG TABLET: 300 | 90 days supply | Qty: 90 | Fill #1

## 2016-03-16 MED FILL — VIT D2 1.25 MG (50,000 UNIT: 1.25 MG | 84 days supply | Qty: 12 | Fill #1

## 2016-03-18 ENCOUNTER — Other Ambulatory Visit: Payer: Self-pay

## 2016-03-18 MED ORDER — CITALOPRAM HYDROBROMIDE 10 MG PO TABS: 10.0000 mg | ORAL_TABLET | Freq: Every day | ORAL | Status: DC

## 2016-03-18 MED FILL — CITALOPRAM HBR 10 MG TABLET: 10 | 90 days supply | Qty: 90 | Fill #0

## 2016-04-25 MED FILL — MYCOPHENOLATE 500 MG TABLET: 500 | 90 days supply | Qty: 540 | Fill #1

## 2016-04-25 MED FILL — TEMAZEPAM 7.5 MG CAPSULE: 7.5 | 30 days supply | Qty: 30 | Fill #3

## 2016-05-23 ENCOUNTER — Ambulatory Visit: Payer: 59 | Admitting: Family Medicine

## 2016-06-06 ENCOUNTER — Other Ambulatory Visit: Payer: Self-pay | Admitting: Family Medicine

## 2016-06-06 DIAGNOSIS — Z1231 Encounter for screening mammogram for malignant neoplasm of breast: Secondary | ICD-10-CM

## 2016-06-08 ENCOUNTER — Ambulatory Visit (HOSPITAL_COMMUNITY): Payer: 59

## 2016-06-10 MED FILL — INVOKANA 300 MG TABLET: 300 | 90 days supply | Qty: 90 | Fill #2

## 2016-06-13 ENCOUNTER — Other Ambulatory Visit (HOSPITAL_COMMUNITY)
Admission: RE | Admit: 2016-06-13 | Discharge: 2016-06-13 | Disposition: A | Discharge status: Home / Self Care | Payer: 59 | Source: Ambulatory Visit | Attending: Family Medicine | Admitting: Family Medicine

## 2016-06-13 DIAGNOSIS — E785 Hyperlipidemia, unspecified: Secondary | ICD-10-CM | POA: Insufficient documentation

## 2016-06-13 DIAGNOSIS — E119 Type 2 diabetes mellitus without complications: Secondary | ICD-10-CM | POA: Diagnosis not present

## 2016-06-13 LAB — COMPREHENSIVE METABOLIC PANEL
ALK PHOS: 57 U/L (ref 38–126)
ALT: 30 U/L (ref 14–54)
AST: 21 U/L (ref 15–41)
Albumin: 4.1 g/dL (ref 3.5–5.0)
Anion gap: 8 (ref 5–15)
BILIRUBIN TOTAL: 0.5 mg/dL (ref 0.3–1.2)
BUN: 18 mg/dL (ref 6–20)
CHLORIDE: 102 mmol/L (ref 101–111)
CO2: 24 mmol/L (ref 22–32)
Calcium: 8.2 mg/dL — ABNORMAL LOW (ref 8.9–10.3)
Creatinine, Ser: 0.61 mg/dL (ref 0.44–1.00)
Glucose, Bld: 132 mg/dL — ABNORMAL HIGH (ref 65–99)
POTASSIUM: 4.1 mmol/L (ref 3.5–5.1)
Sodium: 134 mmol/L — ABNORMAL LOW (ref 135–145)
Total Protein: 6.8 g/dL (ref 6.5–8.1)

## 2016-06-13 LAB — LIPID PANEL
CHOLESTEROL: 259 mg/dL — AB (ref 0–200)
HDL: 47 mg/dL (ref >40)
LDL CALC: 159 mg/dL — AB (ref 0–99)
TRIGLYCERIDES: 264 mg/dL — AB (ref <150)
Total CHOL/HDL Ratio: 5.5 RATIO
VLDL: 53 mg/dL — ABNORMAL HIGH (ref 0–40)

## 2016-06-14 LAB — HEMOGLOBIN A1C
HEMOGLOBIN A1C: 6.8 % — AB (ref 4.8–5.6)
MEAN PLASMA GLUCOSE: 148 mg/dL

## 2016-06-16 ENCOUNTER — Ambulatory Visit (INDEPENDENT_AMBULATORY_CARE_PROVIDER_SITE_OTHER): Payer: 59 | Admitting: Family Medicine

## 2016-06-16 ENCOUNTER — Encounter: Payer: Self-pay | Admitting: Family Medicine

## 2016-06-16 VITALS — BP 134/84 | HR 72 | Resp 16 | Ht 62.0 in | Wt 189.0 lb

## 2016-06-16 DIAGNOSIS — I10 Essential (primary) hypertension: Secondary | ICD-10-CM | POA: Diagnosis not present

## 2016-06-16 DIAGNOSIS — E785 Hyperlipidemia, unspecified: Secondary | ICD-10-CM | POA: Diagnosis not present

## 2016-06-16 DIAGNOSIS — F411 Generalized anxiety disorder: Secondary | ICD-10-CM | POA: Diagnosis not present

## 2016-06-16 DIAGNOSIS — E669 Obesity, unspecified: Secondary | ICD-10-CM

## 2016-06-16 DIAGNOSIS — E119 Type 2 diabetes mellitus without complications: Secondary | ICD-10-CM

## 2016-06-16 DIAGNOSIS — L121 Cicatricial pemphigoid: Secondary | ICD-10-CM

## 2016-06-16 MED ORDER — CITALOPRAM HYDROBROMIDE 20 MG PO TABS: 20.0000 mg | ORAL_TABLET | Freq: Every day | ORAL | 3 refills | Status: DC

## 2016-06-16 MED ORDER — BUSPIRONE HCL 5 MG PO TABS: 5.0000 mg | ORAL_TABLET | Freq: Three times a day (TID) | ORAL | 3 refills | Status: DC

## 2016-06-16 MED FILL — CITALOPRAM HBR 20 MG TABLET: 20 | 90 days supply | Qty: 90 | Fill #0

## 2016-06-16 MED FILL — busPIRone HCL 5 MG TABS: 5 | 30 days supply | Qty: 90 | Fill #0

## 2016-06-16 NOTE — Patient Instructions (Addendum)
F/u in 2 month, call if you need me before  Fasting lipid, cmp and eGFR, HBA1C, in 3 month  Two books recommended New medication for anxiety   Social Anxiety Disorder Social anxiety disorder, previously called social phobia, is a mental disorder. People with social anxiety disorder frequently feel nervous, afraid, or embarrassed when around other people in social situations. They constantly worry that other people are judging or criticizing them for how they look, what they say, or how they act. They may worry that other people might reject them because of their appearance or behavior. Social anxiety disorder is more than just occasional shyness or self-consciousness. It can cause severe emotional distress. It can interfere with daily life activities. Social anxiety disorder also may lead to excessive alcohol or drug use and even suicide.  Social anxiety disorder is actually one of the most common mental disorders. It can develop at any time but usually starts in the teenage years. Women are more commonly affected than men. Social anxiety disorder is also more common in people who have family members with anxiety disorders. It also is more common in people who have physical deformities or conditions with characteristics that are obvious to others, such as stuttered speech or movement abnormalities (Parkinson disease).  SYMPTOMS  In addition to feeling anxious or fearful in social situations, people with social anxiety disorder frequently have physical symptoms. Examples include:  Red face (blushing).  Racing heart.  Sweating.  Shaky hands or voice.  Confusion.  Light-headedness.  Upset stomach and diarrhea. DIAGNOSIS  Social anxiety disorder is diagnosed through an assessment by your health care provider. Your health care provider will ask you questions about your mood, thoughts, and reactions in social situations. Your health care provider may ask you about your medical history and use  of alcohol or drugs, including prescription medicines. Certain medical conditions and the use of certain substances, including caffeine, can cause symptoms similar to social anxiety disorder. Your health care provider may refer you to a mental health specialist for further evaluation or treatment. The criteria for diagnosis of social anxiety disorder are:  Marked fear or anxiety in one or more social situations in which you may be closely watched or studied by others. Examples of such situations include:  Interacting socially (having a conversation with others, going to a party, or meeting strangers).  Being observed (eating or drinking in public or being called on in class).  Performing in front of others (giving a speech).  The social situations of concern almost always cause fear or anxiety, not just occasionally.  People with social anxiety disorder fear that they will be viewed negatively in a way that will be embarrassing, will lead to rejection, or will offend others. This fear is out of proportion to the actual threat posed by the social situation.  Often the triggering social situations are avoided, or they are endured with intense fear or anxiety. The fear, anxiety, or avoidance is persistent and lasts for 6 months or longer.  The anxiety causes difficulty functioning in at least some parts of your daily life. TREATMENT  Several types of treatment are available for social anxiety disorder. These treatments are often used in combination and include:   Talk therapy. Group talk therapy allows you to see that you are not alone with these problems. Individual talk therapy helps you address your specific anxiety issues with a caring professional. The most effective forms of talk therapy for social anxiety disorder are cognitive-behavioral therapy and exposure  therapy. Cognitive-behavioral therapy helps you to identify and change negative thoughts and beliefs that are at the root of the  disorder. Exposure therapy allows you to gradually face the situations that you fear most.  Relaxation and coping techniques. These include deep breathing, self-talk, meditation, visual imagery, and yoga. Relaxation techniques help to keep you calm in social situations.  Social [employer] secretary.Social skills can be learned on your own or with the help of a talk therapist. They can help you feel more confident and comfortable in social situations.  Medicine. For anxiety limited to performance situations (performance anxiety), medicine called beta blockers can help by reducing or preventing the physical symptoms of social anxiety disorder. For more persistent and generalized social anxiety, antidepressant medicine may be prescribed to help control symptoms. In severe cases of social anxiety disorder, strong antianxiety medicine, called benzodiazepines, may be prescribed on a limited basis and for a short time.   This information is not intended to replace advice given to you by your health care provider. Make sure you discuss any questions you have with your health care provider.   Document Released: 09/22/2005 Document Revised: 11/14/2014 Document Reviewed: 01/22/2013 Elsevier Interactive Patient Education 2016 Elsevier Inc. Generalized Anxiety Disorder Generalized anxiety disorder (GAD) is a mental disorder. It interferes with life functions, including relationships, work, and school. GAD is different from normal anxiety, which everyone experiences at some point in their lives in response to specific life events and activities. Normal anxiety actually helps [place] prepare for and get through these life events and activities. Normal anxiety goes away after the event or activity is over.  GAD causes anxiety that is not necessarily related to specific events or activities. It also causes excess anxiety in proportion to specific events or activities. The anxiety associated with GAD is also difficult to  control. GAD can vary from mild to severe. People with severe GAD can have intense waves of anxiety with physical symptoms (panic attacks).  SYMPTOMS The anxiety and worry associated with GAD are difficult to control. This anxiety and worry are related to many life events and activities and also occur more days than not for 6 months or longer. People with GAD also have three or more of the following symptoms (one or more in children):  Restlessness.   Fatigue.  Difficulty concentrating.   Irritability.  Muscle tension.  Difficulty sleeping or unsatisfying sleep. DIAGNOSIS GAD is diagnosed through an assessment by your health care provider. Your health care provider will ask you questions aboutyour mood,physical symptoms, and events in your life. Your health care provider may ask you about your medical history and use of alcohol or drugs, including prescription medicines. Your health care provider may also do a physical exam and blood tests. Certain medical conditions and the use of certain substances can cause symptoms similar to those associated with GAD. Your health care provider may refer you to a mental health specialist for further evaluation. TREATMENT The following therapies are usually used to treat GAD:   Medication. Antidepressant medication usually is prescribed for long-term daily control. Antianxiety medicines may be added in severe cases, especially when panic attacks occur.   Talk therapy (psychotherapy). Certain types of talk therapy can be helpful in treating GAD by providing support, education, and guidance. A form of talk therapy called cognitive behavioral therapy can teach you healthy ways to think about and react to daily life events and activities.  Stress managementtechniques. These include yoga, meditation, and exercise and can be very helpful  when they are practiced regularly. A mental health specialist can help determine which treatment is best for you. Some  people see improvement with one therapy. However, other people require a combination of therapies.   This information is not intended to replace advice given to you by your health care provider. Make sure you discuss any questions you have with your health care provider.   Document Released: 02/18/2013 Document Revised: 11/14/2014 Document Reviewed: 02/18/2013 Elsevier Interactive Patient Education [employer].

## 2016-06-18 NOTE — Assessment & Plan Note (Signed)
Hyperlipidemia:Low fat diet discussed and encouraged.   Lipid Panel  Lab Results  Component Value Date   CHOL 259 (H) 06/13/2016   HDL 47 06/13/2016   LDLCALC 159 (H) 06/13/2016   TRIG 264 (H) 06/13/2016   CHOLHDL 5.5 06/13/2016   Deteriorated and intolerant of meds, will need to rely on dietary chnage

## 2016-06-18 NOTE — Assessment & Plan Note (Signed)
Controlled, no change in medication DASH diet and commitment to daily physical activity for a minimum of 30 minutes discussed and encouraged, as a part of hypertension management. The importance of attaining a healthy weight is also discussed.  BP/Weight 06/16/2016 02/24/2016 11/26/2015 10/22/2015 06/18/2015 02/17/2015 [id number]  Systolic BP [id number] 123456 123456 [id number] 123456  Diastolic BP 84 82 61 82 74 82 82  Wt. (Lbs) 189 180 175 173 170 176 183  BMI 34.57 32.91 32 31.63 31.09 32.18 33.46

## 2016-06-18 NOTE — Assessment & Plan Note (Signed)
Uncontrolled, increase citalopram dose , add buspar, and strongly recommended therapy, no interest/ hesitant at this time

## 2016-06-18 NOTE — Assessment & Plan Note (Signed)
No recent flare and treated by dermatology at [facility]

## 2016-06-18 NOTE — Progress Notes (Signed)
Alicia Neal     MRN: [id number]      DOB: [birth year]   HPI Alicia Neal is here for follow up and re-evaluation of chronic medical conditions, medication management and review of any available recent lab and radiology data.  Preventive health is updated, specifically  Cancer screening and Immunization.   Questions or concerns regarding consultations or procedures which the PT has had in the interim are  addressed. The PT denies any adverse reactions to current medications since the last visit.  Increased anxiety and feeling overwhelmed x 7 months, worse in past 3 months, eating has again become uncontrolled, feels as though food should be her pleasure   ROS Denies recent fever or chills. Denies sinus pressure, nasal congestion, ear pain or sore throat. Denies chest congestion, productive cough or wheezing. Denies chest pains, palpitations and leg swelling Denies abdominal pain, nausea, vomiting,diarrhea or constipation.   Denies dysuria, frequency, hesitancy or incontinence. Denies joint pain, swelling and limitation in mobility. Denies headaches, seizures, numbness, or tingling.  Denies skin break down or rash.   PE  BP 134/84   Pulse 72   Resp 16   Ht 5\' 2"  (1.575 m)   Wt 189 lb (85.7 kg)   SpO2 99%   BMI 34.57 kg/m   Patient alert and oriented and in no cardiopulmonary distress.  HEENT: No facial asymmetry, EOMI,   oropharynx pink and moist.  Neck supple no JVD, no mass.  Chest: Clear to auscultation bilaterally.  CVS: S1, S2 no murmurs, no S3.Regular rate.  ABD: Soft non tender.   Ext: No edema  MS: Adequate ROM spine, shoulders, hips and knees.  Skin: Intact, no ulcerations or rash noted.  Psych: Good eye contact, normal affect. Memory intact  anxious not depressed appearing.  CNS: CN 2-12 intact, power,  normal throughout.no focal deficits noted.   Assessment & Plan HTN, goal below 130/80 Controlled, no change in medication DASH diet and commitment  to daily physical activity for a minimum of 30 minutes discussed and encouraged, as a part of hypertension management. The importance of attaining a healthy weight is also discussed.  BP/Weight 06/16/2016 02/24/2016 11/26/2015 10/22/2015 06/18/2015 02/17/2015 [id number]  Systolic BP Q000111Q 123456 [id number]  Diastolic BP 84 82 61 82 74 82 82  Wt. (Lbs) 189 180 175 173 170 176 183  BMI 34.57 32.91 32 31.63 31.09 32.18 33.46       GAD (generalized anxiety disorder) Uncontrolled, increase citalopram dose , add buspar, and strongly recommended therapy, no interest/ hesitant at this time  Obesity (BMI 30.0-34.9) Deteriorated. Patient re-educated about  the importance of commitment to a  minimum of 150 minutes of exercise per week.  The importance of healthy food choices with portion control discussed. Encouraged to start a food diary, count calories and to consider  joining a support group. Sample diet sheets offered. Goals set by the patient for the next several months.   Weight /BMI 06/16/2016 02/24/2016 11/26/2015  WEIGHT 189 lb 180 lb 175 lb  HEIGHT 5\' 2"  5\' 2"  5\' 2"   BMI 34.57 kg/m2 32.91 kg/m2 32 kg/m2       Hyperlipidemia LDL goal <100 Hyperlipidemia:Low fat diet discussed and encouraged.   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

## 2016-06-18 NOTE — Assessment & Plan Note (Addendum)
Deteriorated. Patient re-educated about  the importance of commitment to a  minimum of 150 minutes of exercise per week.  The importance of healthy food choices with portion control discussed. Encouraged to start a food diary, count calories and to consider  joining a support group. Sample diet sheets offered. Goals set by the patient for the next several months.   Weight /BMI 06/16/2016 02/24/2016 11/26/2015  WEIGHT 189 lb 180 lb 175 lb  HEIGHT 5\' 2"  5\' 2"  5\' 2"   BMI 34.57 kg/m2 32.91 kg/m2 32 kg/m2

## 2016-06-22 ENCOUNTER — Other Ambulatory Visit: Payer: Self-pay | Admitting: Family Medicine

## 2016-06-22 ENCOUNTER — Ambulatory Visit (HOSPITAL_COMMUNITY): Payer: 59

## 2016-06-22 MED FILL — JANUMET 50-1,000 MG TABLET: 50-1000 | 90 days supply | Qty: 180 | Fill #0

## 2016-06-22 MED FILL — PANTOPRAZOLE SOD DR 40 MG T: 40 | 90 days supply | Qty: 90 | Fill #1

## 2016-06-22 MED FILL — LOSARTAN POTASSIUM 50 MG TA: 50 | 90 days supply | Qty: 90 | Fill #2

## 2016-07-15 ENCOUNTER — Ambulatory Visit (HOSPITAL_COMMUNITY)
Admission: RE | Admit: 2016-07-15 | Discharge: 2016-07-15 | Disposition: A | Discharge status: Home / Self Care | Payer: 59 | Source: Ambulatory Visit | Attending: Family Medicine | Admitting: Family Medicine

## 2016-07-15 DIAGNOSIS — Z1231 Encounter for screening mammogram for malignant neoplasm of breast: Secondary | ICD-10-CM | POA: Insufficient documentation

## 2016-07-18 MED FILL — VIT D2 1.25 MG (50,000 UNIT: 1.25 MG | 84 days supply | Qty: 12 | Fill #2

## 2016-07-27 ENCOUNTER — Other Ambulatory Visit (HOSPITAL_COMMUNITY)
Admission: RE | Admit: 2016-07-27 | Discharge: 2016-07-27 | Disposition: A | Discharge status: Home / Self Care | Payer: 59 | Source: Ambulatory Visit | Attending: Dermatology | Admitting: Dermatology

## 2016-07-27 DIAGNOSIS — Z79899 Other long term (current) drug therapy: Secondary | ICD-10-CM | POA: Diagnosis not present

## 2016-07-27 LAB — CBC WITH DIFFERENTIAL/PLATELET
BASOS ABS: 0.1 10*3/uL (ref 0.0–0.1)
Basophils Relative: 1 %
EOS PCT: 3 %
Eosinophils Absolute: 0.2 10*3/uL (ref 0.0–0.7)
HEMATOCRIT: 41.9 % (ref 36.0–46.0)
Hemoglobin: 13.8 g/dL (ref 12.0–15.0)
LYMPHS PCT: 12 %
Lymphs Abs: 1.2 10*3/uL (ref 0.7–4.0)
MCH: 29.8 pg (ref 26.0–34.0)
MCHC: 32.9 g/dL (ref 30.0–36.0)
MCV: 90.5 fL (ref 78.0–100.0)
Monocytes Absolute: 0.7 10*3/uL (ref 0.1–1.0)
Monocytes Relative: 7 %
NEUTROS ABS: 7.6 10*3/uL (ref 1.7–7.7)
Neutrophils Relative %: 77 %
PLATELETS: 335 10*3/uL (ref 150–400)
RBC: 4.63 MIL/uL (ref 3.87–5.11)
RDW: 13.4 % (ref 11.5–15.5)
WBC: 9.7 10*3/uL (ref 4.0–10.5)

## 2016-08-02 MED FILL — MYCOPHENOLATE 500 MG TABLET: 500 | 90 days supply | Qty: 540 | Fill #0

## 2016-08-12 ENCOUNTER — Ambulatory Visit: Payer: 59 | Admitting: Family Medicine

## 2016-08-22 ENCOUNTER — Other Ambulatory Visit (HOSPITAL_COMMUNITY)
Admission: RE | Admit: 2016-08-22 | Discharge: 2016-08-22 | Disposition: A | Discharge status: Home / Self Care | Payer: 59 | Source: Ambulatory Visit | Attending: Family Medicine | Admitting: Family Medicine

## 2016-08-22 DIAGNOSIS — E785 Hyperlipidemia, unspecified: Secondary | ICD-10-CM | POA: Diagnosis not present

## 2016-08-22 DIAGNOSIS — E119 Type 2 diabetes mellitus without complications: Secondary | ICD-10-CM | POA: Insufficient documentation

## 2016-08-22 LAB — COMPREHENSIVE METABOLIC PANEL
ALBUMIN: 4.5 g/dL (ref 3.5–5.0)
ALT: 37 U/L (ref 14–54)
AST: 23 U/L (ref 15–41)
Alkaline Phosphatase: 58 U/L (ref 38–126)
Anion gap: 8 (ref 5–15)
BUN: 15 mg/dL (ref 6–20)
CHLORIDE: 103 mmol/L (ref 101–111)
CO2: 24 mmol/L (ref 22–32)
Calcium: 9 mg/dL (ref 8.9–10.3)
Creatinine, Ser: 0.7 mg/dL (ref 0.44–1.00)
GFR calc Af Amer: >60 mL/min (ref >60)
GLUCOSE: 123 mg/dL — AB (ref 65–99)
POTASSIUM: 4 mmol/L (ref 3.5–5.1)
SODIUM: 135 mmol/L (ref 135–145)
TOTAL PROTEIN: 7.3 g/dL (ref 6.5–8.1)
Total Bilirubin: 0.6 mg/dL (ref 0.3–1.2)

## 2016-08-22 LAB — LIPID PANEL
Cholesterol: 264 mg/dL — ABNORMAL HIGH (ref 0–200)
HDL: 54 mg/dL (ref >40)
LDL CALC: 165 mg/dL — AB (ref 0–99)
Total CHOL/HDL Ratio: 4.9 RATIO
Triglycerides: 226 mg/dL — ABNORMAL HIGH (ref <150)
VLDL: 45 mg/dL — AB (ref 0–40)

## 2016-08-23 ENCOUNTER — Ambulatory Visit (INDEPENDENT_AMBULATORY_CARE_PROVIDER_SITE_OTHER): Payer: 59 | Admitting: Family Medicine

## 2016-08-23 ENCOUNTER — Encounter: Payer: Self-pay | Admitting: Family Medicine

## 2016-08-23 VITALS — BP 138/82 | HR 65 | Ht 62.0 in | Wt 182.1 lb

## 2016-08-23 DIAGNOSIS — F5104 Psychophysiologic insomnia: Secondary | ICD-10-CM

## 2016-08-23 DIAGNOSIS — E669 Obesity, unspecified: Secondary | ICD-10-CM

## 2016-08-23 DIAGNOSIS — E559 Vitamin D deficiency, unspecified: Secondary | ICD-10-CM

## 2016-08-23 DIAGNOSIS — E785 Hyperlipidemia, unspecified: Secondary | ICD-10-CM | POA: Diagnosis not present

## 2016-08-23 DIAGNOSIS — E119 Type 2 diabetes mellitus without complications: Secondary | ICD-10-CM | POA: Diagnosis not present

## 2016-08-23 DIAGNOSIS — L121 Cicatricial pemphigoid: Secondary | ICD-10-CM

## 2016-08-23 DIAGNOSIS — I10 Essential (primary) hypertension: Secondary | ICD-10-CM | POA: Diagnosis not present

## 2016-08-23 DIAGNOSIS — F411 Generalized anxiety disorder: Secondary | ICD-10-CM

## 2016-08-23 LAB — HEMOGLOBIN A1C
HEMOGLOBIN A1C: 6.8 % — AB (ref 4.8–5.6)
Mean Plasma Glucose: 148 mg/dL

## 2016-08-23 MED ORDER — NIACIN ER (ANTIHYPERLIPIDEMIC) 500 MG PO TBCR: 500.0000 mg | EXTENDED_RELEASE_TABLET | Freq: Every day | ORAL | 1 refills | Status: DC

## 2016-08-23 MED ORDER — DIPHENOXYLATE-ATROPINE 2.5-0.025 MG PO TABS: 1.0000 | ORAL_TABLET | Freq: Four times a day (QID) | ORAL | 1 refills | Status: DC | PRN

## 2016-08-23 MED ORDER — SITAGLIPTIN PHOSPHATE 100 MG PO TABS: 100.0000 mg | ORAL_TABLET | Freq: Every day | ORAL | 1 refills | Status: DC

## 2016-08-23 MED FILL — NIACIN ER 500 MG TABLET: 500 | 90 days supply | Qty: 90 | Fill #0

## 2016-08-23 NOTE — Patient Instructions (Addendum)
F/u in 3 months, call if you need me before  Start taking buspar one tablet THREE times daily   STOP JANUMET, new is januvia 100 mg one daily  Reduce fried and fatty foods  New is niaspan one at night for cholesterol  You are referred to diabetic educator Goal for fasting blood sugar ranges from 80 to 120 , if generally over 130 then call will need to add low dose of glipizide Start walking daily  Fasting ;labs prior to next visit please

## 2016-08-26 NOTE — Assessment & Plan Note (Signed)
Intolerant of metformin, change to [place] and  D/c janumet Alicia Neal is reminded of the importance of commitment to daily physical activity for 30 minutes or more, as able and the need to limit carbohydrate intake to 30 to 60 grams per meal to help with blood sugar control.   The need to take medication as prescribed, test blood sugar as directed, and to call between visits if there is a concern that blood sugar is uncontrolled is also discussed.   Alicia Neal is reminded of the importance of daily foot exam, annual eye examination, and good blood sugar, blood pressure and cholesterol control.  Diabetic Labs Latest Ref Rng & Units 08/22/2016 06/13/2016 02/16/2016 10/26/2015 10/14/2015  HbA1c 4.8 - 5.6 % 6.8(H) 6.8(H) 6.8(H) - 6.5(H)  Microalbumin Not estab mg/dL - - - 0.4 -  Micro/Creat Ratio <30 mcg/mg creat - - - 6 -  Chol 0 - 200 mg/dL 264(H) 259(H) 299(H) - 272(H)  HDL >40 mg/dL 54 47 54 - 56  Calc LDL 0 - 99 mg/dL 165(H) 159(H) 208(H) - 176(H)  Triglycerides <150 mg/dL 226(H) 264(H) 184(H) - 199(H)  Creatinine 0.44 - 1.00 mg/dL 0.70 0.61 0.76 - 0.68   BP/Weight 08/23/2016 06/16/2016 02/24/2016 11/26/2015 10/22/2015 06/18/2015 Q000111Q  Systolic BP 0000000 Q000111Q 123456 123456 [id number]  Diastolic BP 82 84 82 61 82 74 82  Wt. (Lbs) 182.12 189 180 175 173 170 176  BMI 33.31 34.57 32.91 32 31.63 31.09 32.18   Foot/eye exam completion dates Latest Ref Rng & Units 10/22/2015 02/13/2015  Eye Exam No Retinopathy - No Retinopathy  Foot Form Completion - Done -

## 2016-08-26 NOTE — Assessment & Plan Note (Signed)
Sleep hygiene reviewed and written information offered also. Prescription sent for  medication needed.  

## 2016-08-26 NOTE — Assessment & Plan Note (Signed)
Currently stable has upcoming appt , pt self adjusted meds and discontinued treatment in apst several months

## 2016-08-26 NOTE — Assessment & Plan Note (Signed)
Controlled, but not at goal, no change in medication DASH diet and commitment to daily physical activity for a minimum of 30 minutes discussed and encouraged, as a part of hypertension management. The importance of attaining a healthy weight is also discussed.  BP/Weight 08/23/2016 06/16/2016 02/24/2016 11/26/2015 10/22/2015 06/18/2015 Q000111Q  Systolic BP 0000000 Q000111Q 123456 123456 [id number]  Diastolic BP 82 84 82 61 82 74 82  Wt. (Lbs) 182.12 189 180 175 173 170 176  BMI 33.31 34.57 32.91 32 31.63 31.09 32.18

## 2016-08-26 NOTE — Progress Notes (Signed)
Alicia Neal     MRN: [id number]      DOB: 08/25/1961   HPI Alicia Neal is here for follow up and re-evaluation of chronic medical conditions, medication management and review of any available recent lab and radiology data.  Preventive health is updated, specifically  Cancer screening and Immunization.  Needs pap Questions or concerns regarding consultations or procedures which the PT has had in the interim are  Addressed.Has upcoming [facility] eval for pemphigus The PT denies any adverse reactions to current medications since the last visit.Has not taken the buspar as prescribed, no adverse s/e noted but does not want to feel over medicated. No interest in therapy which I highly recommend, states she is the "best " in her family as far as anxiety is concerned  Not walking states she has constant loose stool ever since metformin, she had been walking in the past  ROS Denies recent fever or chills. Denies sinus pressure, nasal congestion, ear pain or sore throat. Denies chest congestion, productive cough or wheezing. Denies chest pains, palpitations and leg swelling Denies abdominal pain, nausea, vomiting,or constipation.   Denies dysuria, frequency, hesitancy or incontinence. Denies joint pain, swelling and limitation in mobility. Denies headaches, seizures, numbness, or tingling.  Denies skin break down or rash.   PE  BP 138/82   Pulse 65   Ht 5\' 2"  (1.575 m)   Wt 182 lb 1.9 oz (82.6 kg)   SpO2 97%   BMI 33.31 kg/m   Patient alert and oriented and in no cardiopulmonary distress.  HEENT: No facial asymmetry, EOMI,   oropharynx pink and moist.  Neck supple no JVD, no mass.  Chest: Clear to auscultation bilaterally.  CVS: S1, S2 no murmurs, no S3.Regular rate.  ABD: Soft non tender.   Ext: No edema  MS: Adequate ROM spine, shoulders, hips and knees.  Skin: Intact, no ulcerations or rash noted.  Psych: Good eye contact, normal affect. Memory intact anxious not  depressed appearing.  CNS: CN 2-12 intact, power,  normal throughout.no focal deficits noted.   Assessment & Plan  HTN, goal below 130/80 Controlled, but not at goal, no change in medication DASH diet and commitment to daily physical activity for a minimum of 30 minutes discussed and encouraged, as a part of hypertension management. The importance of attaining a healthy weight is also discussed.  BP/Weight 08/23/2016 06/16/2016 02/24/2016 11/26/2015 10/22/2015 06/18/2015 Q000111Q  Systolic BP 0000000 Q000111Q 123456 123456 [id number]  Diastolic BP 82 84 82 61 82 74 82  Wt. (Lbs) 182.12 189 180 175 173 170 176  BMI 33.31 34.57 32.91 32 31.63 31.09 32.18       Type 2 diabetes mellitus with hemoglobin A1c goal of less than 7.0% (HCC) Intolerant of metformin, change to [place] and  D/c janumet Alicia Neal is reminded of the importance of commitment to daily physical activity for 30 minutes or more, as able and the need to limit carbohydrate intake to 30 to 60 grams per meal to help with blood sugar control.   The need to take medication as prescribed, test blood sugar as directed, and to call between visits if there is a concern that blood sugar is uncontrolled is also discussed.   Alicia Neal is reminded of the importance of daily foot exam, annual eye examination, and good blood sugar, blood pressure and cholesterol control.  Diabetic Labs Latest Ref Rng & Units 08/22/2016 06/13/2016 02/16/2016 10/26/2015 10/14/2015  HbA1c 4.8 - 5.6 % 6.8(H)  6.8(H) 6.8(H) - 6.5(H)  Microalbumin Not estab mg/dL - - - 0.4 -  Micro/Creat Ratio <30 mcg/mg creat - - - 6 -  Chol 0 - 200 mg/dL 264(H) 259(H) 299(H) - 272(H)  HDL >40 mg/dL 54 47 54 - 56  Calc LDL 0 - 99 mg/dL 165(H) 159(H) 208(H) - 176(H)  Triglycerides <150 mg/dL 226(H) 264(H) 184(H) - 199(H)  Creatinine 0.44 - 1.00 mg/dL 0.70 0.61 0.76 - 0.68   BP/Weight 08/23/2016 06/16/2016 02/24/2016 11/26/2015 10/22/2015 06/18/2015 Q000111Q  Systolic BP 0000000 Q000111Q 123456 123456 124  [id number]  Diastolic BP 82 84 82 61 82 74 82  Wt. (Lbs) 182.12 189 180 175 173 170 176  BMI 33.31 34.57 32.91 32 31.63 31.09 32.18   Foot/eye exam completion dates Latest Ref Rng & Units 10/22/2015 02/13/2015  Eye Exam No Retinopathy - No Retinopathy  Foot Form Completion - Done -        Hyperlipidemia LDL goal <100 Deteriorated and uncontrolled Trial of niaspan Hyperlipidemia:Low fat diet discussed and encouraged.   Lipid Panel  Lab Results  Component Value Date   CHOL 264 (H) 08/22/2016   HDL 54 08/22/2016   LDLCALC 165 (H) 08/22/2016   TRIG 226 (H) 08/22/2016   CHOLHDL 4.9 08/22/2016       Obesity (BMI 30.0-34.9) Deteriorated. Patient re-educated about  the importance of commitment to a  minimum of 150 minutes of exercise per week.  The importance of healthy food choices with portion control discussed. Encouraged to start a food diary, count calories and to consider  joining a support group. Sample diet sheets offered. Goals set by the patient for the next several months.   Weight /BMI 08/23/2016 06/16/2016 02/24/2016  WEIGHT 182 lb 1.9 oz 189 lb 180 lb  HEIGHT 5\' 2"  5\' 2"  5\' 2"   BMI 33.31 kg/m2 34.57 kg/m2 32.91 kg/m2  needs to resume walking    Pemphigoid, cicatricial Currently stable has upcoming appt , pt self adjusted meds and discontinued treatment in apst several months  Vitamin D deficiency Updated lab needed at/ before next visit.   Insomnia Sleep hygiene reviewed and written information offered also. Prescription sent for  medication needed.   GAD (generalized anxiety disorder) No significant change dueto non compliance with med dosing, re educated re need for med

## 2016-08-26 NOTE — Assessment & Plan Note (Signed)
Deteriorated and uncontrolled Trial of niaspan Hyperlipidemia:Low fat diet discussed and encouraged.   Lipid Panel  Lab Results  Component Value Date   CHOL 264 (H) 08/22/2016   HDL 54 08/22/2016   LDLCALC 165 (H) 08/22/2016   TRIG 226 (H) 08/22/2016   CHOLHDL 4.9 08/22/2016

## 2016-08-26 NOTE — Assessment & Plan Note (Signed)
Updated lab needed at/ before next visit.   

## 2016-08-26 NOTE — Assessment & Plan Note (Signed)
Deteriorated. Patient re-educated about  the importance of commitment to a  minimum of 150 minutes of exercise per week.  The importance of healthy food choices with portion control discussed. Encouraged to start a food diary, count calories and to consider  joining a support group. Sample diet sheets offered. Goals set by the patient for the next several months.   Weight /BMI 08/23/2016 06/16/2016 02/24/2016  WEIGHT 182 lb 1.9 oz 189 lb 180 lb  HEIGHT 5\' 2"  5\' 2"  5\' 2"   BMI 33.31 kg/m2 34.57 kg/m2 32.91 kg/m2  needs to resume walking

## 2016-08-26 NOTE — Assessment & Plan Note (Signed)
No significant change dueto non compliance with med dosing, re educated re need for med

## 2016-08-29 ENCOUNTER — Other Ambulatory Visit: Payer: Self-pay

## 2016-08-29 MED ORDER — SITAGLIPTIN PHOSPHATE 100 MG PO TABS
100.0000 mg | ORAL_TABLET | Freq: Every day | ORAL | 1 refills | Status: DC
Start: 2016-08-29 — End: 2017-02-23

## 2016-08-29 MED ORDER — DIPHENOXYLATE-ATROPINE 2.5-0.025 MG PO TABS: 1.0000 | ORAL_TABLET | Freq: Four times a day (QID) | ORAL | 1 refills | Status: DC | PRN

## 2016-08-29 MED FILL — DIPHENOXYLATE-ATROPINE TAB: 2.5-0.025 | 30 days supply | Qty: 120 | Fill #0

## 2016-08-29 MED FILL — JANUVIA 100 MG TABLET: 100 | 90 days supply | Qty: 90 | Fill #0

## 2016-08-30 ENCOUNTER — Telehealth: Payer: Self-pay | Admitting: Gastroenterology

## 2016-08-30 DIAGNOSIS — R197 Diarrhea, unspecified: Secondary | ICD-10-CM

## 2016-08-30 NOTE — Telephone Encounter (Signed)
PT HAVING TNTC WATERY STOOL ON CELLCEPT. NEEDS STOOL STUDY FOR : C DIFF PCR,  GIARDIA/CRYPTOSPORIDIUM. FECAL LACTOFERRIN. FAX TO LAB AT [facility]. [facility] THUR OCT 26 E30 DIARRHEA. OK TO USE LOMOTIL PRN 1-2X/DAY.

## 2016-08-30 NOTE — Telephone Encounter (Signed)
PATIENT SCHEDULED FOR 11:30 09/01/16

## 2016-08-30 NOTE — Telephone Encounter (Signed)
REVIEWED-NO ADDITIONAL RECOMMENDATIONS. 

## 2016-08-30 NOTE — Telephone Encounter (Signed)
Lab orders have been faxed to [facility] lab.

## 2016-08-31 ENCOUNTER — Other Ambulatory Visit (HOSPITAL_COMMUNITY)
Admission: RE | Admit: 2016-08-31 | Discharge: 2016-08-31 | Disposition: A | Discharge status: Home / Self Care | Payer: 59 | Source: Ambulatory Visit | Attending: Gastroenterology | Admitting: Gastroenterology

## 2016-08-31 ENCOUNTER — Telehealth: Payer: Self-pay | Admitting: Gastroenterology

## 2016-08-31 DIAGNOSIS — R197 Diarrhea, unspecified: Secondary | ICD-10-CM | POA: Diagnosis not present

## 2016-08-31 LAB — C DIFFICILE QUICK SCREEN W PCR REFLEX
C DIFFICILE (CDIFF) INTERP: NOT DETECTED
C Diff antigen: NEGATIVE
C Diff toxin: NEGATIVE

## 2016-08-31 MED FILL — busPIRone HCL 5 MG TABS: 5 | 30 days supply | Qty: 90 | Fill #1

## 2016-08-31 NOTE — Telephone Encounter (Signed)
Pt called this morning to cancel her OV with [facility] tomorrow. She said that she would see SF outside of the office and would talk to her then

## 2016-09-01 ENCOUNTER — Ambulatory Visit: Payer: 59 | Admitting: Gastroenterology

## 2016-09-01 DIAGNOSIS — H524 Presbyopia: Secondary | ICD-10-CM | POA: Diagnosis not present

## 2016-09-01 DIAGNOSIS — H5213 Myopia, bilateral: Secondary | ICD-10-CM | POA: Diagnosis not present

## 2016-09-01 NOTE — Telephone Encounter (Signed)
REVIEWED-NO ADDITIONAL RECOMMENDATIONS. 

## 2016-09-01 NOTE — Telephone Encounter (Signed)
BOWELS IMPROVED WITH HOLDING JANUMET.

## 2016-09-04 LAB — GIARDIA/CRYPTOSPORIDIUM EIA
CRYPTOSPORIDIUM EIA: NEGATIVE
Giardia Ag, Stl: NEGATIVE

## 2016-09-05 ENCOUNTER — Other Ambulatory Visit: Payer: Self-pay | Admitting: Family Medicine

## 2016-09-05 ENCOUNTER — Telehealth: Payer: Self-pay

## 2016-09-05 ENCOUNTER — Encounter: Payer: Self-pay | Admitting: Family Medicine

## 2016-09-05 LAB — FECAL LACTOFERRIN, QUANT

## 2016-09-05 MED ORDER — GLIPIZIDE ER 5 MG PO TB24: 5.0000 mg | ORAL_TABLET | Freq: Every day | ORAL | 1 refills | Status: DC

## 2016-09-05 MED FILL — glipiZIDE ER 5 MG TB24: 5 | 90 days supply | Qty: 90 | Fill #0

## 2016-09-05 NOTE — Telephone Encounter (Signed)
MyChart msg sent to patient.

## 2016-09-05 NOTE — Telephone Encounter (Signed)
Glipizide 5mg  sent to [facility]. Needs to be referred to diabetic ed, and to keep appt, start carb counting, and hopefully she has again started walking regularly.pls let herknow to call in next 2 weeks after starting the glipizide if blood suagrs are  Still fasting averaging above 130, thanks

## 2016-09-07 NOTE — Telephone Encounter (Signed)
Noted that refer has been placed

## 2016-09-12 ENCOUNTER — Ambulatory Visit (HOSPITAL_COMMUNITY)
Admission: RE | Admit: 2016-09-12 | Discharge: 2016-09-12 | Disposition: A | Discharge status: Home / Self Care | Payer: 59 | Source: Ambulatory Visit | Attending: Acute Care | Admitting: Acute Care

## 2016-09-12 ENCOUNTER — Telehealth: Payer: Self-pay | Admitting: Acute Care

## 2016-09-12 DIAGNOSIS — I700 Atherosclerosis of aorta: Secondary | ICD-10-CM | POA: Diagnosis not present

## 2016-09-12 DIAGNOSIS — Z87891 Personal history of nicotine dependence: Secondary | ICD-10-CM | POA: Diagnosis not present

## 2016-09-12 DIAGNOSIS — D3501 Benign neoplasm of right adrenal gland: Secondary | ICD-10-CM | POA: Insufficient documentation

## 2016-09-12 DIAGNOSIS — K760 Fatty (change of) liver, not elsewhere classified: Secondary | ICD-10-CM | POA: Insufficient documentation

## 2016-09-12 DIAGNOSIS — I2510 Atherosclerotic heart disease of native coronary artery without angina pectoris: Secondary | ICD-10-CM | POA: Insufficient documentation

## 2016-09-12 DIAGNOSIS — Z122 Encounter for screening for malignant neoplasm of respiratory organs: Secondary | ICD-10-CM | POA: Insufficient documentation

## 2016-09-12 DIAGNOSIS — F17210 Nicotine dependence, cigarettes, uncomplicated: Secondary | ICD-10-CM

## 2016-09-12 NOTE — Telephone Encounter (Signed)
I have called the results of Alicia Neal's LDCT to her. I explained that her scan was read as a Lung RADS 2: nodules that are benign in appearance and behavior with a very low likelihood of becoming a clinically active cancer due to size or lack of growth. Recommendation per radiology is for a repeat LDCT in 12 months.I told her we will order and schedule her annual scan for 09/2017. I also told her her scan indicated aortic atherosclerosis, in addition to left main and 3 vessel coronary artery disease . I explained that in this non-gated exam the presence of atherosclerosis and calcification could be detected, but degree or severity cannot. She stated that her cholesterol is elevated and that she and her PCP are working on finding a medication that she can tolerate.I also explained that her scan revealed a a stable 2 cm adrenal adenoma and severe hepatic steatosis. She stated that neither of these were new diagnoses.She verbalized understanding of the above and had no further questions upon completion of the call. I will fax a copy of the exam results  to the patient's PCP.

## 2016-09-13 ENCOUNTER — Encounter: Payer: Self-pay | Admitting: Family Medicine

## 2016-09-14 ENCOUNTER — Other Ambulatory Visit: Payer: Self-pay | Admitting: Family Medicine

## 2016-09-16 MED FILL — INVOKANA 300 MG TABLET: 300 | 90 days supply | Qty: 90 | Fill #3

## 2016-09-26 MED FILL — CITALOPRAM HBR 20 MG TABLET: 20 | 90 days supply | Qty: 90 | Fill #1

## 2016-09-27 ENCOUNTER — Encounter: Payer: Self-pay | Admitting: Family Medicine

## 2016-09-27 ENCOUNTER — Encounter: Payer: 59 | Attending: Family Medicine | Admitting: Nutrition

## 2016-09-27 ENCOUNTER — Encounter: Payer: Self-pay | Admitting: Nutrition

## 2016-09-27 VITALS — Ht 61.5 in | Wt 183.4 lb

## 2016-09-27 DIAGNOSIS — E1165 Type 2 diabetes mellitus with hyperglycemia: Secondary | ICD-10-CM

## 2016-09-27 DIAGNOSIS — Z713 Dietary counseling and surveillance: Secondary | ICD-10-CM | POA: Diagnosis not present

## 2016-09-27 DIAGNOSIS — E118 Type 2 diabetes mellitus with unspecified complications: Secondary | ICD-10-CM

## 2016-09-27 DIAGNOSIS — IMO0002 Reserved for concepts with insufficient information to code with codable children: Secondary | ICD-10-CM

## 2016-09-27 DIAGNOSIS — E669 Obesity, unspecified: Secondary | ICD-10-CM

## 2016-09-27 NOTE — Patient Instructions (Signed)
Goals 1. Follow My Plate 2. Increase low carb vegetables to 2 svg per meal 3. Drink 4-5 bottles of water per day 4. Cut out snacks 5. Keep exercicsing Take meds as prescribed Ask Dr. Moshe Cipro for referral to Dr. Dorris Fetch

## 2016-09-27 NOTE — Progress Notes (Signed)
Medical Nutrition Therapy:  Appt start time: 0800 end time:  0900.   Assessment:  Primary concerns today: Diabetes Type 2 . LIves with her boyfriend and son. . She does the cooking and shopping.  Eats three meals per day and snacks. Complains of being hungry all the time and wants sweets a lot. PMH: DM, HTN, Hyperlipidemia, Depression and obesity and an auto immune disorder.  Meds for DM: Januvia 100 mg and Invokana 300 mg and Glipizide 10 mg/d.  She had been on Janument but had chronic diarrhea and wasn recently stopped. She has had DM for about 10 years or longer.  BMI 34.   TCHOL 264 mg/dl, TG 226 mg/dl HDL 54 mg/dl , LDL 165 mg/dl Physical actvity: walks 30-45 mins minutes 5 times per week.  Diet is excessive in carbs, high in fat, salt and sugar and low in fresh fruits and low carb vegetables.  She is not drinking enough water either and this may be adding to her cravings for sweets and food and making blood sugars worse.       She would benefit from stopping Glipizide and may benefit from a  long acting insulin since she has had DM for > 10 yrs or so if her BS don't improve with diet modifications.. May consider the extended release low dose of Metformin to see if that will help and not cause diarrhea.  Wt Readings from Last 3 Encounters:  09/27/16 183 lb 6.4 oz (83.2 kg)  08/23/16 182 lb 1.9 oz (82.6 kg)  06/16/16 189 lb (85.7 kg)   Ht Readings from Last 3 Encounters:  09/27/16 5' 1.5" (1.562 m)  08/23/16 5\' 2"  (1.575 m)  06/16/16 5\' 2"  (1.575 m)   Body mass index is 34.09 kg/m.  Preferemces  No preference indicated   Learning Readiness:   Ready  Change in progress   MEDICATIONS:   DIETARY INTAKE:    24-hr recall:  B ( AM):  Ofatmeal or PB crackers or protein bar, coffee-splenda  Snk ( AM): cheese, nuts and dates, dried fruit,, water, Diet Coke 12 oz  L ( PM): Toss salad with ranch , fruit with ranch dressing, beets, cukes, gpeppers, tomatoes, bacon, chceese and  croutons and dates, water Snk ( PM): snickers bite size pieces 5, Planters peanuts,  Diet coke D ( PM): BBQ ribs, baked beans, potato salad, corn bread, 1 slice strawberry cheesecake, water Snk ( PM): chrismas candy in jar Beverages: water, diet soda  Usual physical activity: walks  Estimated energy needs: 1500  calories 170  g carbohydrates 112 g protein 42 g fat  Progress Towards Goal(s):  In progress.   Nutritional Diagnosis:  NB-1.1 Food and nutrition-related knowledge deficit As related to Diabetes.  As evidenced by 6.8%.    Intervention: Nutrition and Diabetes education provided on My Plate, CHO counting, meal planning, portion sizes, timing of meals, avoiding snacks between meals unless having a low blood sugar, target ranges for A1C and blood sugars, signs/symptoms and treatment of hyper/hypoglycemia, monitoring blood sugars, taking medications as prescribed, benefits of exercising 30 minutes per day and prevention of complications of DM.   Goals 1. Follow My Plate 2. Increase low carb vegetables to 2 svg per meal 3. Drink 4-5 bottles of water per day 4. Cut out snacks 5. Keep exercicsing Take meds as prescribed Ask Dr. Moshe Cipro for referral to Dr. Dorris Fetch Test blood sugar before meals and bedtimes x 1 week and bring with visit to [facility] and appt with  me.  Teaching Method Utilized:  Visual Auditory Hands on  Handouts given during visit include:  The Plate Method   Meal Plan Card  Diabetes Instructions.   Barriers to learning/adherence to lifestyle change: none  Demonstrated degree of understanding via:  Teach Back   Monitoring/Evaluation:  Dietary intake, exercise, meal planning, SBG, and body weight in 1 month(s). Recommendations: Stop Glipizide. May consider a extended release metformin to see if able to use without diarrhea. May need a low dose of long acting insulin if BS don't improve after diet modifications. Recommend referral to Dr. Dorris Fetch to evaluate for  better BS control and  If need for insulin.

## 2016-10-02 ENCOUNTER — Encounter: Payer: Self-pay | Admitting: Family Medicine

## 2016-11-03 ENCOUNTER — Ambulatory Visit: Payer: 59 | Admitting: Nutrition

## 2016-11-09 DIAGNOSIS — Z5181 Encounter for therapeutic drug level monitoring: Secondary | ICD-10-CM | POA: Diagnosis not present

## 2016-11-09 DIAGNOSIS — L121 Cicatricial pemphigoid: Secondary | ICD-10-CM | POA: Diagnosis not present

## 2016-11-09 MED FILL — MYCOPHENOLATE 500 MG TABLET: 500 | 60 days supply | Qty: 240 | Fill #0

## 2016-11-16 ENCOUNTER — Encounter: Payer: Self-pay | Admitting: Family Medicine

## 2016-11-18 DIAGNOSIS — E119 Type 2 diabetes mellitus without complications: Secondary | ICD-10-CM | POA: Diagnosis not present

## 2016-11-18 DIAGNOSIS — E785 Hyperlipidemia, unspecified: Secondary | ICD-10-CM | POA: Diagnosis not present

## 2016-11-18 LAB — COMPLETE METABOLIC PANEL WITH GFR
ALT: 17 U/L (ref 6–29)
AST: 17 U/L (ref 10–35)
Albumin: 4.6 g/dL (ref 3.6–5.1)
Alkaline Phosphatase: 69 U/L (ref 33–130)
BUN: 18 mg/dL (ref 7–25)
CO2: 24 mmol/L (ref 20–31)
Calcium: 9.5 mg/dL (ref 8.6–10.4)
Chloride: 100 mmol/L (ref 98–110)
Creat: 0.84 mg/dL (ref 0.50–1.05)
GFR, EST NON AFRICAN AMERICAN: 77 mL/min (ref >=60)
GFR, Est African American: 89 mL/min (ref >=60)
GLUCOSE: 129 mg/dL — AB (ref 65–99)
POTASSIUM: 4.4 mmol/L (ref 3.5–5.3)
SODIUM: 136 mmol/L (ref 135–146)
Total Bilirubin: 0.6 mg/dL (ref 0.2–1.2)
Total Protein: 7 g/dL (ref 6.1–8.1)

## 2016-11-18 LAB — HEMOGLOBIN A1C
Hgb A1c MFr Bld: 7.2 % — ABNORMAL HIGH (ref <5.7)
Mean Plasma Glucose: 160 mg/dL

## 2016-11-18 LAB — LIPID PANEL
CHOL/HDL RATIO: 5.6 ratio — AB (ref <5.0)
Cholesterol: 293 mg/dL — ABNORMAL HIGH (ref <200)
HDL: 52 mg/dL (ref >50)
LDL Cholesterol: 211 mg/dL — ABNORMAL HIGH (ref <100)
Triglycerides: 151 mg/dL — ABNORMAL HIGH (ref <150)
VLDL: 30 mg/dL (ref <30)

## 2016-11-21 MED FILL — busPIRone HCL 5 MG TABS: 5 | 30 days supply | Qty: 90 | Fill #2

## 2016-11-21 MED FILL — VIT D2 1.25 MG (50,000 UNIT: 1.25 MG | 84 days supply | Qty: 12 | Fill #3

## 2016-11-22 ENCOUNTER — Ambulatory Visit (INDEPENDENT_AMBULATORY_CARE_PROVIDER_SITE_OTHER): Payer: 59 | Admitting: Family Medicine

## 2016-11-22 ENCOUNTER — Encounter: Payer: Self-pay | Admitting: Family Medicine

## 2016-11-22 VITALS — BP 116/80 | HR 71 | Resp 16 | Ht 62.0 in | Wt 178.0 lb

## 2016-11-22 DIAGNOSIS — E785 Hyperlipidemia, unspecified: Secondary | ICD-10-CM | POA: Diagnosis not present

## 2016-11-22 DIAGNOSIS — E119 Type 2 diabetes mellitus without complications: Secondary | ICD-10-CM | POA: Diagnosis not present

## 2016-11-22 DIAGNOSIS — E669 Obesity, unspecified: Secondary | ICD-10-CM | POA: Diagnosis not present

## 2016-11-22 DIAGNOSIS — E66811 Obesity, class 1: Secondary | ICD-10-CM

## 2016-11-22 DIAGNOSIS — I10 Essential (primary) hypertension: Secondary | ICD-10-CM | POA: Diagnosis not present

## 2016-11-22 DIAGNOSIS — F411 Generalized anxiety disorder: Secondary | ICD-10-CM

## 2016-11-22 MED ORDER — PITAVASTATIN CALCIUM 1 MG PO TABS: ORAL_TABLET | ORAL | 1 refills | Status: DC

## 2016-11-22 MED FILL — LIVALO 1 MG TABLET: 1 | 90 days supply | Qty: 90 | Fill #0

## 2016-11-22 MED FILL — JANUVIA 100 MG TABLET: 100 | 30 days supply | Qty: 30 | Fill #1

## 2016-11-22 NOTE — Patient Instructions (Addendum)
F/u in 3 month, call if you need me sooner  congrats on improved blood sugar, no change in management  Please continue to see diabetic educator  Cholesterol is higher, PLEASE eat green and clean!  New med livalo to help with cholesterol, stop niacin Fasting lipid, cmp and eGFR and hBA1C in 3 months  Condolence and prayers on recent ;loss Thanks for choosing [facility], we consider it a privelige to serve you

## 2016-11-24 ENCOUNTER — Encounter: Payer: Self-pay | Admitting: Family Medicine

## 2016-11-24 NOTE — Assessment & Plan Note (Signed)
Controlled, no change in medication  

## 2016-11-24 NOTE — Progress Notes (Signed)
Alicia Neal     MRN: [id number]      DOB: 07-17-1961   HPI Alicia Neal is here for follow up and re-evaluation of chronic medical conditions, medication management and review of any available recent lab and radiology data.  Preventive health is updated, specifically  Cancer screening and Immunization.   Questions or concerns regarding consultations or procedures which the PT has had in the interim are  addressed. 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 Denies sinus pressure, nasal congestion, ear pain or sore throat. Denies chest congestion, productive cough or wheezing. Denies chest pains, palpitations and leg swelling Denies abdominal pain, nausea, vomiting,diarrhea or constipation.   Denies dysuria, frequency, hesitancy or incontinence. Denies joint pain, swelling and limitation in mobility. Denies headaches, seizures, numbness, or tingling  Denies skin break down or rash.   PE  BP 116/80   Pulse 71   Resp 16   Ht 5\' 2"  (1.575 m)   Wt 178 lb (80.7 kg)   SpO2 99%   BMI 32.56 kg/m   Patient alert and oriented and in no cardiopulmonary distress.  HEENT: No facial asymmetry, EOMI,   oropharynx pink and moist.  Neck supple no JVD, no mass.  Chest: Clear to auscultation bilaterally.  CVS: S1, S2 no murmurs, no S3.Regular rate.  ABD: Soft non tender.   Ext: No edema  MS: Adequate ROM spine, shoulders, hips and knees.  Skin: Intact, no ulcerations or rash noted.  Psych: Good eye contact, normal affect. Memory intact not anxious or depressed appearing.  CNS: CN 2-12 intact, power,   normal throughout.no focal deficits noted.   Assessment & Plan  HTN, goal below 130/80 Controlled, no change in medication DASH diet and commitment to daily physical activity for a minimum of 30 minutes discussed and encouraged, as a part of hypertension management. The importance of attaining a healthy weight is also discussed.  BP/Weight 11/22/2016 09/27/2016 08/23/2016 06/16/2016 02/24/2016 11/26/2015 [id number]  Systolic BP 99991111 - 0000000 Q000111Q 123456 [id number]  Diastolic BP 80 - 82 84 82 61 82  Wt. (Lbs) 178 183.4 182.12 189 180 175 173  BMI 32.56 34.09 33.31 34.57 32.91 32 31.63       Type 2 diabetes mellitus with hemoglobin A1c goal of less than 7.0% (HCC) Improved, pt applauded on this Ms. Lively is reminded of the importance of commitment to daily physical activity for 30 minutes or more, as able and the need to limit carbohydrate intake to 30 to 60 grams per meal to help with blood sugar control.   The need to take medication as prescribed, test blood sugar as directed, and to call between visits if there is a concern that blood sugar is uncontrolled is also discussed.   Ms. Devilla is reminded of the importance of daily foot exam, annual eye examination, and good blood sugar, blood pressure and cholesterol control.  Diabetic Labs Latest Ref Rng & Units 11/18/2016 08/22/2016 06/13/2016 02/16/2016 10/26/2015  HbA1c <5.7 % 7.2(H) 6.8(H) 6.8(H) 6.8(H) -  Microalbumin Not estab mg/dL - - - - 0.4  Micro/Creat Ratio <30 mcg/mg creat - - - -  6  Chol <200 mg/dL 293(H) 264(H) 259(H) 299(H) -  HDL >50 mg/dL 52 54 47 54 -  Calc LDL <100 mg/dL 211(H) 165(H) 159(H) 208(H) -  Triglycerides <150 mg/dL 151(H) 226(H) 264(H) 184(H) -  Creatinine 0.50 - 1.05 mg/dL 0.84 0.70 0.61 0.76 -   BP/Weight 11/22/2016 09/27/2016 08/23/2016 06/16/2016 02/24/2016 11/26/2015 [id number]  Systolic BP 99991111 - 0000000 Q000111Q 123456 123456 [id number]  Diastolic BP 80 - 82 84 82 61 82  Wt. (Lbs) 178 183.4 182.12 189 180 175 173  BMI 32.56 34.09  33.31 34.57 32.91 32 31.63   Foot/eye exam completion dates Latest Ref Rng & Units 10/22/2015 02/13/2015  Eye Exam No Retinopathy - No Retinopathy  Foot Form Completion - Done -        Hyperlipidemia LDL goal <100 Deteriorated Hyperlipidemia:Low fat diet discussed and encouraged.   Lipid Panel  Lab Results  Component Value Date   CHOL 293 (H) 11/18/2016   HDL 52 11/18/2016   LDLCALC 211 (H) 11/18/2016   TRIG 151 (H) 11/18/2016   CHOLHDL 5.6 (H) 11/18/2016     Start lowest dose livalo Updated lab needed at/ before next visit.   Obesity (BMI 30.0-34.9) Improved. Pt applauded on succesful weight loss through lifestyle change, and encouraged to continue same. Weight loss goal set for the next several months.   GAD (generalized anxiety disorder) Controlled, no change in medication   Allergic rhinitis Controlled, no change in medication

## 2016-11-24 NOTE — Assessment & Plan Note (Signed)
Improved. Pt applauded on succesful weight loss through lifestyle change, and encouraged to continue same. Weight loss goal set for the next several months.  

## 2016-11-24 NOTE — Assessment & Plan Note (Signed)
Controlled, no change in medication DASH diet and commitment to daily physical activity for a minimum of 30 minutes discussed and encouraged, as a part of hypertension management. The importance of attaining a healthy weight is also discussed.  BP/Weight 11/22/2016 09/27/2016 08/23/2016 06/16/2016 02/24/2016 11/26/2015 [id number]  Systolic BP 99991111 - 0000000 Q000111Q 123456 123456 [id number]  Diastolic BP 80 - 82 84 82 61 82  Wt. (Lbs) 178 183.4 182.12 189 180 175 173  BMI 32.56 34.09 33.31 34.57 32.91 32 31.63

## 2016-11-24 NOTE — Assessment & Plan Note (Signed)
Deteriorated Hyperlipidemia:Low fat diet discussed and encouraged.   Lipid Panel  Lab Results  Component Value Date   CHOL 293 (H) 11/18/2016   HDL 52 11/18/2016   LDLCALC 211 (H) 11/18/2016   TRIG 151 (H) 11/18/2016   CHOLHDL 5.6 (H) 11/18/2016     Start lowest dose livalo Updated lab needed at/ before next visit.

## 2016-11-24 NOTE — Assessment & Plan Note (Signed)
Improved, pt applauded on this Alicia Neal is reminded of the importance of commitment to daily physical activity for 30 minutes or more, as able and the need to limit carbohydrate intake to 30 to 60 grams per meal to help with blood sugar control.   The need to take medication as prescribed, test blood sugar as directed, and to call between visits if there is a concern that blood sugar is uncontrolled is also discussed.   Alicia Neal is reminded of the importance of daily foot exam, annual eye examination, and good blood sugar, blood pressure and cholesterol control.  Diabetic Labs Latest Ref Rng & Units 11/18/2016 08/22/2016 06/13/2016 02/16/2016 10/26/2015  HbA1c <5.7 % 7.2(H) 6.8(H) 6.8(H) 6.8(H) -  Microalbumin Not estab mg/dL - - - - 0.4  Micro/Creat Ratio <30 mcg/mg creat - - - - 6  Chol <200 mg/dL 293(H) 264(H) 259(H) 299(H) -  HDL >50 mg/dL 52 54 47 54 -  Calc LDL <100 mg/dL 211(H) 165(H) 159(H) 208(H) -  Triglycerides <150 mg/dL 151(H) 226(H) 264(H) 184(H) -  Creatinine 0.50 - 1.05 mg/dL 0.84 0.70 0.61 0.76 -   BP/Weight 11/22/2016 09/27/2016 08/23/2016 06/16/2016 02/24/2016 11/26/2015 [id number]  Systolic BP 99991111 - 0000000 Q000111Q 123456 123456 [id number]  Diastolic BP 80 - 82 84 82 61 82  Wt. (Lbs) 178 183.4 182.12 189 180 175 173  BMI 32.56 34.09 33.31 34.57 32.91 32 31.63   Foot/eye exam completion dates Latest Ref Rng & Units 10/22/2015 02/13/2015  Eye Exam No Retinopathy - No Retinopathy  Foot Form Completion - Done -

## 2016-12-19 ENCOUNTER — Other Ambulatory Visit: Payer: Self-pay | Admitting: Family Medicine

## 2016-12-19 MED FILL — INVOKANA 300 MG TABLET: 300 | 90 days supply | Qty: 90 | Fill #0

## 2016-12-22 MED FILL — JANUVIA 100 MG TABLET: 100 | 60 days supply | Qty: 60 | Fill #2

## 2017-01-02 ENCOUNTER — Other Ambulatory Visit: Payer: Self-pay | Admitting: Family Medicine

## 2017-01-02 MED FILL — CITALOPRAM HBR 20 MG TABLET: 20 | 90 days supply | Qty: 90 | Fill #2

## 2017-01-03 ENCOUNTER — Other Ambulatory Visit: Payer: Self-pay

## 2017-01-03 DIAGNOSIS — I10 Essential (primary) hypertension: Secondary | ICD-10-CM

## 2017-01-03 MED ORDER — LOSARTAN POTASSIUM 25 MG PO TABS: 25.0000 mg | ORAL_TABLET | Freq: Every day | ORAL | 1 refills | Status: DC

## 2017-01-03 MED FILL — LOSARTAN POTASSIUM 25 MG TA: 25 | 90 days supply | Qty: 90 | Fill #0

## 2017-01-09 MED FILL — glipiZIDE XL 5 MG TB24: 5 | 90 days supply | Qty: 90 | Fill #1

## 2017-01-23 IMAGING — CT CT CHEST LUNG CANCER SCREENING LOW DOSE W/O CM
2 of 4 series · 11 of 40 positions shown, 13 images · non-contrast
Comparison: 03/03/2014

CLINICAL DATA: Forty pack-year history. Former smoker.
Asymptomatic.

EXAM:
CT CHEST WITHOUT CONTRAST
TECHNIQUE: Multidetector CT imaging of the chest was performed following the
standard protocol without IV contrast.

[Series 4: mpr coro 1mm · coronal · 0.59mm/px · 3 of 255 slices shown]
[im 51/255  lung]
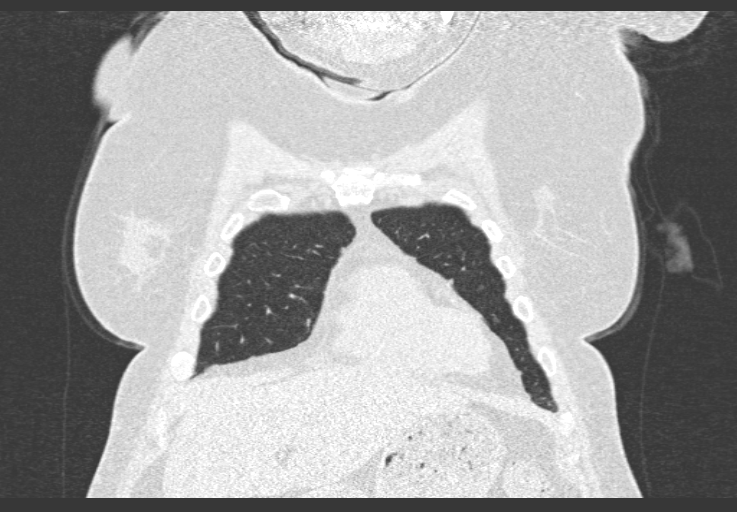
[im 102/255  lung]
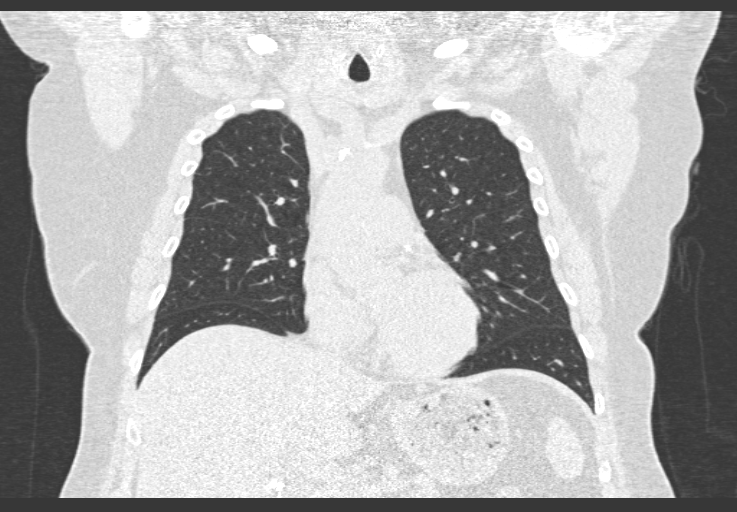
[im 153/255  lung]
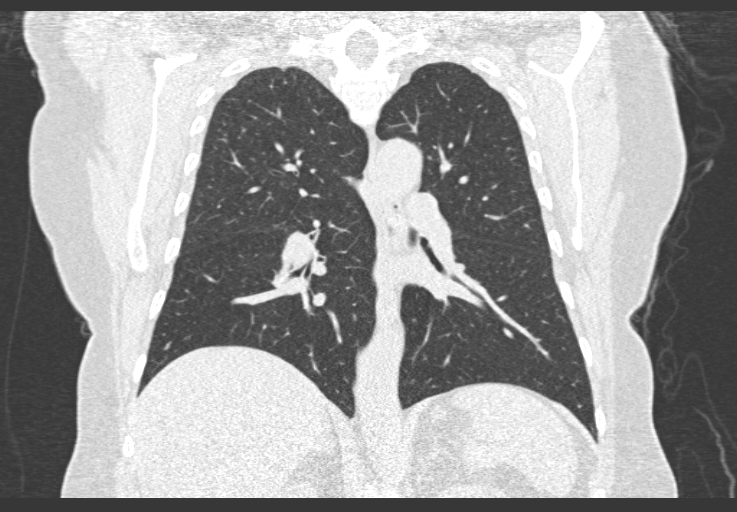

[ct lung segmentation data · axial · 0.66mm/px · z∈[-272,-272]mm · 8 of 288 frames shown]
[frame 1/288  mediastinal]
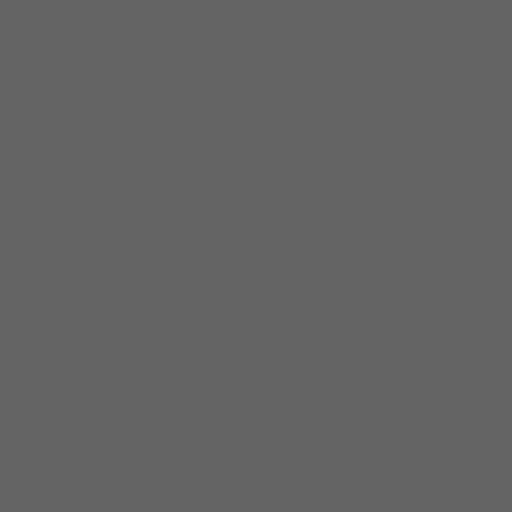
[frame 1/288  lung]
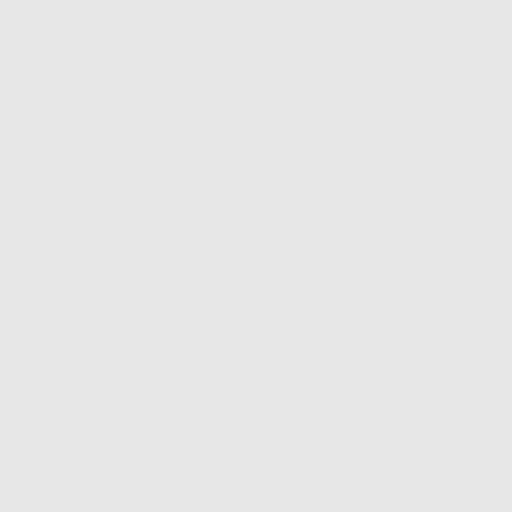
[frame 32/288  lung]
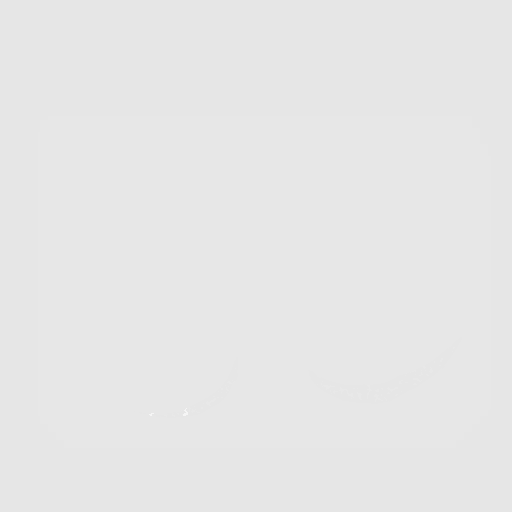
[frame 64/288  lung]
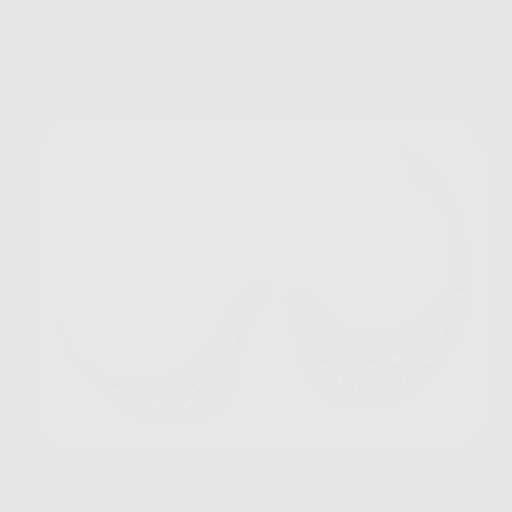
[frame 96/288  lung]
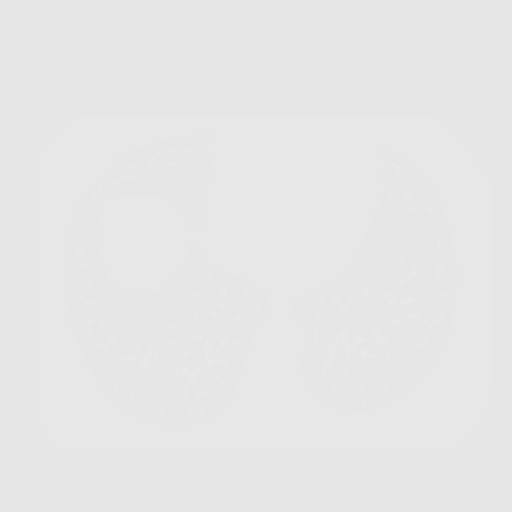
[frame 128/288  mediastinal]
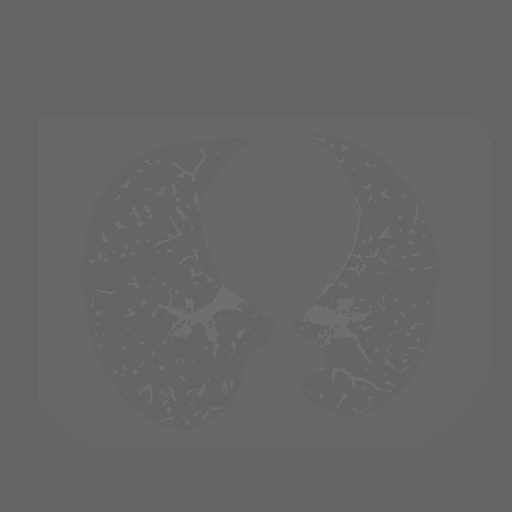
[frame 128/288  lung]
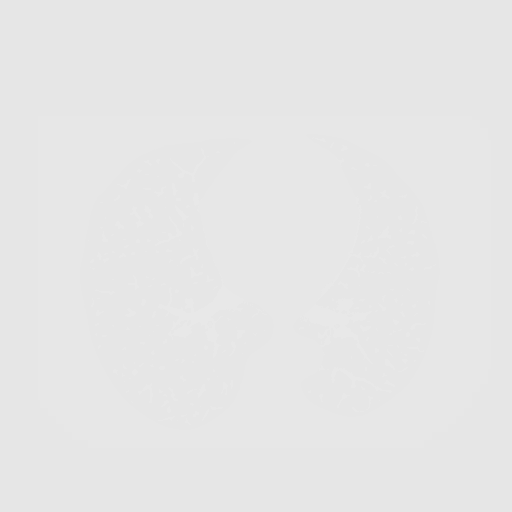
[frame 160/288  lung]
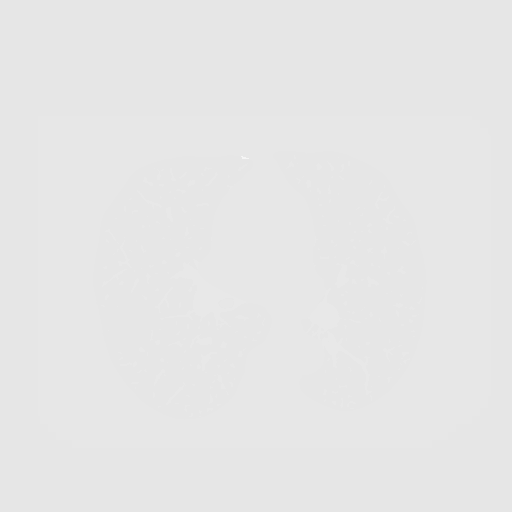
[frame 192/288  lung]
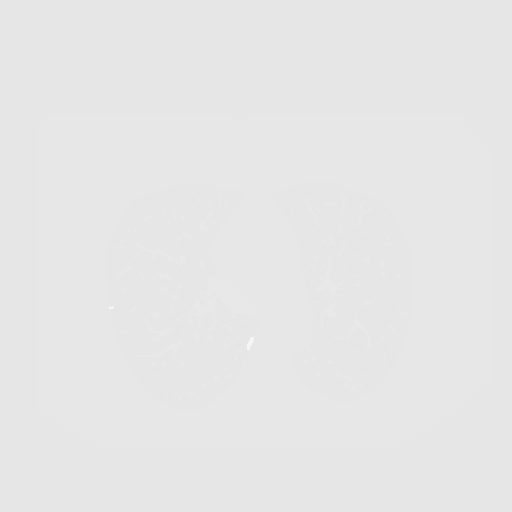
[frame 224/288  lung]
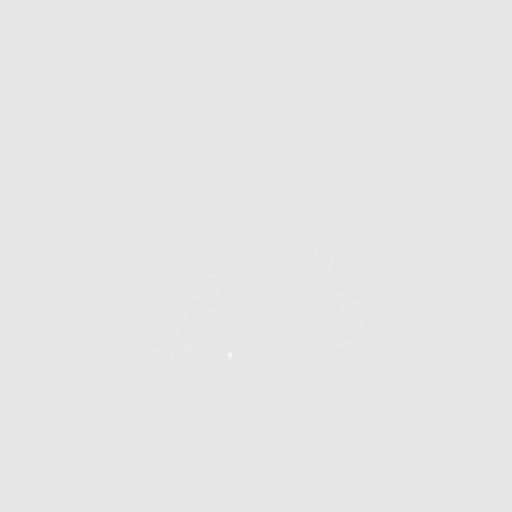

[11 of 40 positions shown; findings below may reference images not displayed]

FINDINGS: Mediastinum: The heart size appears normal. No pericardial effusion.
The trachea appears patent and is midline. Normal appearance of the
esophagus. There is aortic atherosclerosis noted. Calcification
within the LAD and RCA coronary artery noted. No mediastinal or
hilar adenopathy. There is no axillary or supraclavicular
adenopathy.

Lungs/Pleura: No pleural fluid identified. No atelectasis or
airspace consolidation identified. Two very small nodules are
identified in the right lung. The largest has an equivalent diameter
of 2.7 mm, image 64 of series 3.

Upper Abdomen: Stable low-density nodule and right adrenal gland
measuring 2.0 cm, image 53/ series 2. Likely adenoma. No suspicious
liver abnormality. The visualized portions of the spleen are normal.

Musculoskeletal: No aggressive lytic or sclerotic bone lesions. Mild
spondylosis noted within the thoracic spine.
IMPRESSION: 1. Lung-RADS Category 2, benign appearance or behavior. Continue
annual screening with low-dose chest CT without contrast in 12
months
2. Aortic atherosclerosis.
3. Right adrenal nodule.  Likely benign adenoma.

## 2017-02-07 ENCOUNTER — Other Ambulatory Visit: Payer: Self-pay | Admitting: Family Medicine

## 2017-02-07 MED FILL — VIT D2 1.25 MG (50,000 UNIT: 1.25 MG | 84 days supply | Qty: 12 | Fill #0

## 2017-02-07 MED FILL — busPIRone HCL 5 MG TABS: 5 | 30 days supply | Qty: 90 | Fill #3

## 2017-02-10 ENCOUNTER — Other Ambulatory Visit: Payer: Self-pay

## 2017-02-10 MED ORDER — GLUCOSE BLOOD VI STRP: ORAL_STRIP | 5 refills | Status: DC

## 2017-02-10 MED ORDER — ACCU-CHEK SOFTCLIX LANCET DEV MISC: 5 refills | Status: DC

## 2017-02-10 MED FILL — ACCU-CHEK GUIDE TEST STRIP: 90 days supply | Qty: 100 | Fill #0

## 2017-02-10 MED FILL — ACCU-CHEK FASTCLIX LANCETS: 90 days supply | Qty: 102 | Fill #0 | Status: TO

## 2017-02-13 MED FILL — MYCOPHENOLATE 500 MG TABLET: 500 | 60 days supply | Qty: 240 | Fill #1

## 2017-02-23 ENCOUNTER — Other Ambulatory Visit: Payer: Self-pay

## 2017-02-23 MED ORDER — SITAGLIPTIN PHOSPHATE 100 MG PO TABS
100.0000 mg | ORAL_TABLET | Freq: Every day | ORAL | 1 refills | Status: DC
Start: 2017-02-23 — End: 2017-09-06

## 2017-02-23 MED FILL — JANUVIA 100 MG TABLET: 100 | 90 days supply | Qty: 90 | Fill #0

## 2017-02-27 ENCOUNTER — Other Ambulatory Visit (HOSPITAL_COMMUNITY)
Admission: RE | Admit: 2017-02-27 | Discharge: 2017-02-27 | Disposition: A | Discharge status: Home / Self Care | Payer: 59 | Source: Ambulatory Visit | Attending: Family Medicine | Admitting: Family Medicine

## 2017-02-27 DIAGNOSIS — E119 Type 2 diabetes mellitus without complications: Secondary | ICD-10-CM | POA: Diagnosis not present

## 2017-02-27 DIAGNOSIS — E785 Hyperlipidemia, unspecified: Secondary | ICD-10-CM | POA: Insufficient documentation

## 2017-02-27 LAB — COMPREHENSIVE METABOLIC PANEL
ALBUMIN: 4.1 g/dL (ref 3.5–5.0)
ALK PHOS: 63 U/L (ref 38–126)
ALT: 20 U/L (ref 14–54)
ANION GAP: 9 (ref 5–15)
AST: 17 U/L (ref 15–41)
BILIRUBIN TOTAL: 0.5 mg/dL (ref 0.3–1.2)
BUN: 15 mg/dL (ref 6–20)
CALCIUM: 9 mg/dL (ref 8.9–10.3)
CO2: 27 mmol/L (ref 22–32)
CREATININE: 0.67 mg/dL (ref 0.44–1.00)
Chloride: 100 mmol/L — ABNORMAL LOW (ref 101–111)
GFR calc Af Amer: >60 mL/min (ref >60)
GFR calc non Af Amer: >60 mL/min (ref >60)
GLUCOSE: 162 mg/dL — AB (ref 65–99)
Potassium: 4 mmol/L (ref 3.5–5.1)
SODIUM: 136 mmol/L (ref 135–145)
TOTAL PROTEIN: 7 g/dL (ref 6.5–8.1)

## 2017-02-27 LAB — LIPID PANEL
CHOL/HDL RATIO: 4.5 ratio
CHOLESTEROL: 179 mg/dL (ref 0–200)
HDL: 40 mg/dL — ABNORMAL LOW (ref >40)
LDL Cholesterol: 117 mg/dL — ABNORMAL HIGH (ref 0–99)
Triglycerides: 112 mg/dL (ref <150)
VLDL: 22 mg/dL (ref 0–40)

## 2017-02-28 LAB — HEMOGLOBIN A1C
Hgb A1c MFr Bld: 7.6 % — ABNORMAL HIGH (ref 4.8–5.6)
MEAN PLASMA GLUCOSE: 171 mg/dL

## 2017-03-01 ENCOUNTER — Ambulatory Visit (INDEPENDENT_AMBULATORY_CARE_PROVIDER_SITE_OTHER): Payer: 59 | Admitting: Family Medicine

## 2017-03-01 ENCOUNTER — Encounter: Payer: Self-pay | Admitting: Family Medicine

## 2017-03-01 VITALS — BP 120/78 | HR 73 | Resp 16 | Ht 62.0 in | Wt 188.0 lb

## 2017-03-01 DIAGNOSIS — Q898 Other specified congenital malformations: Secondary | ICD-10-CM | POA: Diagnosis not present

## 2017-03-01 DIAGNOSIS — E669 Obesity, unspecified: Secondary | ICD-10-CM | POA: Diagnosis not present

## 2017-03-01 DIAGNOSIS — F411 Generalized anxiety disorder: Secondary | ICD-10-CM

## 2017-03-01 DIAGNOSIS — E559 Vitamin D deficiency, unspecified: Secondary | ICD-10-CM

## 2017-03-01 DIAGNOSIS — E119 Type 2 diabetes mellitus without complications: Secondary | ICD-10-CM | POA: Diagnosis not present

## 2017-03-01 DIAGNOSIS — I10 Essential (primary) hypertension: Secondary | ICD-10-CM

## 2017-03-01 DIAGNOSIS — E785 Hyperlipidemia, unspecified: Secondary | ICD-10-CM | POA: Diagnosis not present

## 2017-03-01 MED ORDER — BENZONATATE 100 MG PO CAPS: 100.0000 mg | ORAL_CAPSULE | Freq: Two times a day (BID) | ORAL | 0 refills | Status: DC | PRN

## 2017-03-01 MED ORDER — GLIPIZIDE ER 10 MG PO TB24: ORAL_TABLET | ORAL | 3 refills | Status: DC

## 2017-03-01 MED ORDER — METFORMIN HCL ER 500 MG PO TB24: ORAL_TABLET | ORAL | 3 refills | Status: DC

## 2017-03-01 MED FILL — METFORMIN HCL ER 500 MG TAB: 500 | 90 days supply | Qty: 90 | Fill #0

## 2017-03-01 MED FILL — glipiZIDE XL 10 MG TB24: 10 | 90 days supply | Qty: 180 | Fill #0

## 2017-03-01 MED FILL — BENZONATATE 100 MG CAP: 100 | 10 days supply | Qty: 20 | Fill #0

## 2017-03-01 NOTE — Patient Instructions (Addendum)
f/u in 2nd week in August, call if you need me before  Improved blood cholesterol  Change eating habits as discussed, increase vegetables to improve blood sugar  A dd metformin one at bedtime IF tolerated  Vit D, HBA1C, , cmp and EGFR , hBA1C, tSH  You will be referred to endo , Dr Dorris Fetch to evaluate the approx 6 month h/o "shrinking' noted on left side, from foot size , to breast size, hair loss and size of left eye, lashes and facial muscle  It is important that you exercise regularly at least 30 minutes 5 times a week. If you develop chest pain, have severe difficulty breathing, or feel very tired, stop exercising immediately and seek medical attention   Thank you  for choosing [facility]. We consider it a privelige to serve you.  Delivering excellent health care in a caring and  compassionate way is our goal.  Partnering with you,  so that together we can achieve this goal is our strategy.

## 2017-03-03 DIAGNOSIS — Q898 Other specified congenital malformations: Secondary | ICD-10-CM | POA: Insufficient documentation

## 2017-03-03 NOTE — Assessment & Plan Note (Signed)
Deteriorated, add low dose metformin if tolerated and pt to work on improved control of diet Ms. Matzek is reminded of the importance of commitment to daily physical activity for 30 minutes or more, as able and the need to limit carbohydrate intake to 30 to 60 grams per meal to help with blood sugar control.   The need to take medication as prescribed, test blood sugar as directed, and to call between visits if there is a concern that blood sugar is uncontrolled is also discussed.   Ms. Killingsworth is reminded of the importance of daily foot exam, annual eye examination, and good blood sugar, blood pressure and cholesterol control.  Diabetic Labs Latest Ref Rng & Units 02/27/2017 11/18/2016 08/22/2016 06/13/2016 02/16/2016  HbA1c 4.8 - 5.6 % 7.6(H) 7.2(H) 6.8(H) 6.8(H) 6.8(H)  Microalbumin Not estab mg/dL - - - - -  Micro/Creat Ratio <30 mcg/mg creat - - - - -  Chol 0 - 200 mg/dL 179 293(H) 264(H) 259(H) 299(H)  HDL >40 mg/dL 40(L) 52 54 47 54  Calc LDL 0 - 99 mg/dL 117(H) 211(H) 165(H) 159(H) 208(H)  Triglycerides <150 mg/dL 112 151(H) 226(H) 264(H) 184(H)  Creatinine 0.44 - 1.00 mg/dL 0.67 0.84 0.70 0.61 0.76   BP/Weight 03/01/2017 11/22/2016 09/27/2016 08/23/2016 06/16/2016 02/24/2016 05/19/4579  Systolic BP 998 338 - 250 539 767 341  Diastolic BP 78 80 - 82 84 82 61  Wt. (Lbs) 188 178 183.4 182.12 189 180 175  BMI 34.39 32.56 34.09 33.31 34.57 32.91 32   Foot/eye exam completion dates Latest Ref Rng & Units 10/22/2015 02/13/2015  Eye Exam No Retinopathy - No Retinopathy  Foot Form Completion - Done -      Updated lab needed at/ before next visit.

## 2017-03-03 NOTE — Assessment & Plan Note (Signed)
Deteriorated. Patient re-educated about  the importance of commitment to a  minimum of 150 minutes of exercise per week.  The importance of healthy food choices with portion control discussed. Encouraged to start a food diary, count calories and to consider  joining a support group. Sample diet sheets offered. Goals set by the patient for the next several months.   Weight /BMI 03/01/2017 11/22/2016 09/27/2016  WEIGHT 188 lb 178 lb 183 lb 6.4 oz  HEIGHT 5\' 2"  5\' 2"  5' 1.5"  BMI 34.39 kg/m2 32.56 kg/m2 34.09 kg/m2

## 2017-03-03 NOTE — Assessment & Plan Note (Signed)
Improved, no med change Hyperlipidemia:Low fat diet discussed and encouraged.   Lipid Panel  Lab Results  Component Value Date   CHOL 179 02/27/2017   HDL 40 (L) 02/27/2017   LDLCALC 117 (H) 02/27/2017   TRIG 112 02/27/2017   CHOLHDL 4.5 02/27/2017   continue to lower fried and fatty food intake not at goal but markedly improved

## 2017-03-03 NOTE — Assessment & Plan Note (Signed)
Controlled, no change in medication DASH diet and commitment to daily physical activity for a minimum of 30 minutes discussed and encouraged, as a part of hypertension management. The importance of attaining a healthy weight is also discussed.  BP/Weight 03/01/2017 11/22/2016 09/27/2016 08/23/2016 06/16/2016 02/24/2016 4/40/1027  Systolic BP 253 664 - 403 474 259 563  Diastolic BP 78 80 - 82 84 82 61  Wt. (Lbs) 188 178 183.4 182.12 189 180 175  BMI 34.39 32.56 34.09 33.31 34.57 32.91 32

## 2017-03-03 NOTE — Assessment & Plan Note (Signed)
Adequate medication management , however increased personal stress due to recent loss of Mother and issues with her adult son, therapy would be beneficial however pt has never been interested in pursuing this route, the hope is that she will take better control of her diet in the next 3 months to improve her health esp blood sugar

## 2017-03-03 NOTE — Assessment & Plan Note (Signed)
Reports in April 2018 , an approx 6 month h/o left sided "shrinking" from thinning of the hair and left eye lash, to small left eye, loss of left facial muscle with assymetry, smaller left breast and first noted that she had to stuff her left shoe to keep her foot from falling out of the shoe Will refer for endo eval On exam assymetry described is noted

## 2017-03-03 NOTE — Progress Notes (Signed)
DONALD JACQUE     MRN: [id number]      DOB: Jan 14, 1961   HPI Ms. Wass is here for follow up and re-evaluation of chronic medical conditions, medication management and review of any available recent lab and radiology data.  Preventive health is updated, specifically  Cancer screening and Immunization.   c/o increased stress in past 4 months, lost her Mom unexpectedly, and also helping her 58 y/o son cope with depression following break up of a long relationship, he refuses therapy which he needs and is severely depressed. She is compensating by over indulging in carbs in the evenings, and therefore her blood sugar is not as controlled as she knows that it should be, resists insulin, states will try very low dose metformin and attempt to improve diet. She is exercising, she is not smoking No adverse s/e from statin therapy with excellent response Notes in past 6 months that the left side of her body appears to be "shrinking" has to stuff her shoe for it to fit, and up to the crown of her head she notes thinning and hair loss, reduced breast size and facial assymetry There are no specific complaints  c/o fluctuations in blood sugar and elevated blood sugars  ROS Denies recent fever or chills. Denies sinus pressure, nasal congestion, ear pain or sore throat. Denies chest congestion, productive cough or wheezing. Denies chest pains, palpitations and leg swelling Denies abdominal pain, nausea, vomiting,diarrhea or constipation.   Denies dysuria, frequency, hesitancy or incontinence. Denies joint pain, swelling and limitation in mobility. Denies headaches, seizures, numbness, or tingling. . Denies skin break down or rash.   PE  BP 120/78   Pulse 73   Resp 16   Ht 5\' 2"  (1.575 m)   Wt 188 lb (85.3 kg)   SpO2 96%   BMI 34.39 kg/m   Patient alert and oriented and in no cardiopulmonary distress.  HEENT: mild left sided weakness, left eye smaller than right , EOMI,   oropharynx pink  and moist.  Neck supple no JVD, no mass.  Chest: Clear to auscultation bilaterally.Left breast appears smaller than right  CVS: S1, S2 no murmurs, no S3.Regular rate.  ABD: Soft non tender.   Ext: No edema  MS: Adequate ROM spine, shoulders, hips and knees.  Skin: Intact, no ulcerations or rash noted.  Psych: Good eye contact, normal affect. Memory intact not anxious or depressed appearing.  CNS: CN 2-12 intact, power,  normal throughout.no focal deficits noted.   Assessment & Plan  HTN, goal below 130/80 Controlled, no change in medication DASH diet and commitment to daily physical activity for a minimum of 30 minutes discussed and encouraged, as a part of hypertension management. The importance of attaining a healthy weight is also discussed.  BP/Weight 03/01/2017 11/22/2016 09/27/2016 08/23/2016 06/16/2016 02/24/2016 5/62/1308  Systolic BP 657 846 - 962 952 841 324  Diastolic BP 78 80 - 82 84 82 61  Wt. (Lbs) 188 178 183.4 182.12 189 180 175  BMI 34.39 32.56 34.09 33.31 34.57 32.91 32       Type 2 diabetes mellitus with hemoglobin A1c goal of less than 7.0% (HCC) Deteriorated, add low dose metformin if tolerated and pt to work on improved control of diet Ms. Hickmon is reminded of the importance of commitment to daily physical activity for 30 minutes or more, as able and the need to limit carbohydrate intake to 30 to 60 grams per meal to help with blood sugar control.  The need to take medication as prescribed, test blood sugar as directed, and to call between visits if there is a concern that blood sugar is uncontrolled is also discussed.   Ms. Stricker is reminded of the importance of daily foot exam, annual eye examination, and good blood sugar, blood pressure and cholesterol control.  Diabetic Labs Latest Ref Rng & Units 02/27/2017 11/18/2016 08/22/2016 06/13/2016 02/16/2016  HbA1c 4.8 - 5.6 % 7.6(H) 7.2(H) 6.8(H) 6.8(H) 6.8(H)  Microalbumin Not estab mg/dL - - - - -    Micro/Creat Ratio <30 mcg/mg creat - - - - -  Chol 0 - 200 mg/dL 179 293(H) 264(H) 259(H) 299(H)  HDL >40 mg/dL 40(L) 52 54 47 54  Calc LDL 0 - 99 mg/dL 117(H) 211(H) 165(H) 159(H) 208(H)  Triglycerides <150 mg/dL 112 151(H) 226(H) 264(H) 184(H)  Creatinine 0.44 - 1.00 mg/dL 0.67 0.84 0.70 0.61 0.76   BP/Weight 03/01/2017 11/22/2016 09/27/2016 08/23/2016 06/16/2016 02/24/2016 1/69/4503  Systolic BP 888 280 - 034 917 915 056  Diastolic BP 78 80 - 82 84 82 61  Wt. (Lbs) 188 178 183.4 182.12 189 180 175  BMI 34.39 32.56 34.09 33.31 34.57 32.91 32   Foot/eye exam completion dates Latest Ref Rng & Units 10/22/2015 02/13/2015  Eye Exam No Retinopathy - No Retinopathy  Foot Form Completion - Done -      Updated lab needed at/ before next visit.   Hyperlipidemia LDL goal <100 Improved, no med change Hyperlipidemia:Low fat diet discussed and encouraged.   Lipid Panel  Lab Results  Component Value Date   CHOL 179 02/27/2017   HDL 40 (L) 02/27/2017   LDLCALC 117 (H) 02/27/2017   TRIG 112 02/27/2017   CHOLHDL 4.5 02/27/2017   continue to lower fried and fatty food intake not at goal but markedly improved    Obesity (BMI 30.0-34.9) Deteriorated. Patient re-educated about  the importance of commitment to a  minimum of 150 minutes of exercise per week.  The importance of healthy food choices with portion control discussed. Encouraged to start a food diary, count calories and to consider  joining a support group. Sample diet sheets offered. Goals set by the patient for the next several months.   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

## 2017-03-06 MED FILL — LIVALO 1 MG TABLET: 1 | 90 days supply | Qty: 90 | Fill #1

## 2017-03-22 MED FILL — INVOKANA 300 MG TABLET: 300 | 90 days supply | Qty: 90 | Fill #1

## 2017-04-04 MED FILL — CITALOPRAM HBR 20 MG TABLET: 20 | 90 days supply | Qty: 90 | Fill #3

## 2017-04-04 MED FILL — LOSARTAN POTASSIUM 25 MG TA: 25 | 90 days supply | Qty: 90 | Fill #1

## 2017-05-01 MED FILL — MYCOPHENOLATE 500 MG TABLET: 500 | 60 days supply | Qty: 240 | Fill #2 | Status: TO

## 2017-05-11 ENCOUNTER — Other Ambulatory Visit: Payer: Self-pay | Admitting: Family Medicine

## 2017-05-11 MED FILL — ACCU-CHEK GUIDE TEST STRIP: 90 days supply | Qty: 100 | Fill #1

## 2017-05-11 MED FILL — VIT D2 1.25 MG (50,000 UNIT: 1.25 MG | 84 days supply | Qty: 12 | Fill #0 | Status: TO

## 2017-06-05 ENCOUNTER — Other Ambulatory Visit: Payer: Self-pay | Admitting: Family Medicine

## 2017-06-05 MED FILL — JANUVIA 100 MG TABLET: 100 | 90 days supply | Qty: 90 | Fill #1

## 2017-06-05 MED FILL — METFORMIN HCL ER 500 MG TAB: 500 | 90 days supply | Qty: 90 | Fill #1

## 2017-06-06 MED FILL — INVOKANA 300 MG TABLET: 300 | 90 days supply | Qty: 90 | Fill #0

## 2017-06-06 MED FILL — busPIRone HCL 5 MG TABS: 5 | 30 days supply | Qty: 90 | Fill #0 | Status: TO

## 2017-06-06 MED FILL — LIVALO 1 MG TABLET: 1 | 90 days supply | Qty: 90 | Fill #0 | Status: TO

## 2017-06-08 ENCOUNTER — Other Ambulatory Visit (HOSPITAL_COMMUNITY)
Admission: RE | Admit: 2017-06-08 | Discharge: 2017-06-08 | Disposition: A | Discharge status: Home / Self Care | Payer: 59 | Source: Ambulatory Visit | Attending: Family Medicine | Admitting: Family Medicine

## 2017-06-08 DIAGNOSIS — E785 Hyperlipidemia, unspecified: Secondary | ICD-10-CM | POA: Diagnosis not present

## 2017-06-08 DIAGNOSIS — E119 Type 2 diabetes mellitus without complications: Secondary | ICD-10-CM | POA: Insufficient documentation

## 2017-06-08 LAB — COMPREHENSIVE METABOLIC PANEL
ALK PHOS: 58 U/L (ref 38–126)
ALT: 32 U/L (ref 14–54)
AST: 29 U/L (ref 15–41)
Albumin: 4.4 g/dL (ref 3.5–5.0)
Anion gap: 10 (ref 5–15)
BUN: 22 mg/dL — ABNORMAL HIGH (ref 6–20)
CALCIUM: 8.9 mg/dL (ref 8.9–10.3)
CO2: 24 mmol/L (ref 22–32)
CREATININE: 0.79 mg/dL (ref 0.44–1.00)
Chloride: 100 mmol/L — ABNORMAL LOW (ref 101–111)
Glucose, Bld: 167 mg/dL — ABNORMAL HIGH (ref 65–99)
Potassium: 4.1 mmol/L (ref 3.5–5.1)
Sodium: 134 mmol/L — ABNORMAL LOW (ref 135–145)
Total Bilirubin: 0.5 mg/dL (ref 0.3–1.2)
Total Protein: 7.3 g/dL (ref 6.5–8.1)

## 2017-06-08 LAB — LIPID PANEL
Cholesterol: 247 mg/dL — ABNORMAL HIGH (ref 0–200)
HDL: 58 mg/dL (ref >40)
LDL Cholesterol: 146 mg/dL — ABNORMAL HIGH (ref 0–99)
TRIGLYCERIDES: 213 mg/dL — AB (ref <150)
Total CHOL/HDL Ratio: 4.3 RATIO
VLDL: 43 mg/dL — ABNORMAL HIGH (ref 0–40)

## 2017-06-08 LAB — TSH: TSH: 1.724 u[IU]/mL (ref 0.350–4.500)

## 2017-06-09 ENCOUNTER — Encounter: Payer: Self-pay | Admitting: Family Medicine

## 2017-06-09 LAB — HEMOGLOBIN A1C
HEMOGLOBIN A1C: 6.8 % — AB (ref 4.8–5.6)
Mean Plasma Glucose: 148 mg/dL

## 2017-06-09 LAB — VITAMIN D 25 HYDROXY (VIT D DEFICIENCY, FRACTURES): Vit D, 25-Hydroxy: 30.4 ng/mL (ref 30.0–100.0)

## 2017-06-12 ENCOUNTER — Ambulatory Visit (INDEPENDENT_AMBULATORY_CARE_PROVIDER_SITE_OTHER): Payer: 59 | Admitting: Family Medicine

## 2017-06-12 ENCOUNTER — Encounter: Payer: Self-pay | Admitting: Family Medicine

## 2017-06-12 VITALS — BP 124/84 | HR 82 | Resp 16 | Ht 62.0 in | Wt 193.0 lb

## 2017-06-12 DIAGNOSIS — E669 Obesity, unspecified: Secondary | ICD-10-CM

## 2017-06-12 DIAGNOSIS — E119 Type 2 diabetes mellitus without complications: Secondary | ICD-10-CM | POA: Diagnosis not present

## 2017-06-12 DIAGNOSIS — F411 Generalized anxiety disorder: Secondary | ICD-10-CM

## 2017-06-12 DIAGNOSIS — I10 Essential (primary) hypertension: Secondary | ICD-10-CM

## 2017-06-12 DIAGNOSIS — E785 Hyperlipidemia, unspecified: Secondary | ICD-10-CM | POA: Diagnosis not present

## 2017-06-12 MED ORDER — PITAVASTATIN CALCIUM 2 MG PO TABS: 1.0000 | ORAL_TABLET | Freq: Every day | ORAL | 1 refills | Status: DC

## 2017-06-12 NOTE — Patient Instructions (Signed)
F/u in 12 weeks, call if you need me before  Please CHANGE to green eating!  Reduce fat intake  PLEASE schedule and get your sleep study  Please schedule your pap  Please schedule and mammogram  CONGRATS on blood sugar IMPROVED  It is important that you exercise regularly at least 30 minutes 7 times a week. If you develop chest pain, have severe difficulty breathing, or feel very tired, stop exercising immediately and seek medical attention   Thank you  for choosing [facility]. We consider it a privelige to serve you.  Delivering excellent health care in a caring and  compassionate way is our goal.  Partnering with you,  so that together we can achieve this goal is our strategy.  \

## 2017-06-13 NOTE — Assessment & Plan Note (Signed)
Controlled, no change in medication. Greatly improved and at goal Alicia Neal is reminded of the importance of commitment to daily physical activity for 30 minutes or more, as able and the need to limit carbohydrate intake to 30 to 60 grams per meal to help with blood sugar control.   The need to take medication as prescribed, test blood sugar as directed, and to call between visits if there is a concern that blood sugar is uncontrolled is also discussed.   Alicia Neal is reminded of the importance of daily foot exam, annual eye examination, and good blood sugar, blood pressure and cholesterol control.  Diabetic Labs Latest Ref Rng & Units 06/08/2017 02/27/2017 11/18/2016 08/22/2016 06/13/2016  HbA1c 4.8 - 5.6 % 6.8(H) 7.6(H) 7.2(H) 6.8(H) 6.8(H)  Microalbumin Not estab mg/dL - - - - -  Micro/Creat Ratio <30 mcg/mg creat - - - - -  Chol 0 - 200 mg/dL 247(H) 179 293(H) 264(H) 259(H)  HDL >40 mg/dL 58 40(L) 52 54 47  Calc LDL 0 - 99 mg/dL 146(H) 117(H) 211(H) 165(H) 159(H)  Triglycerides <150 mg/dL 213(H) 112 151(H) 226(H) 264(H)  Creatinine 0.44 - 1.00 mg/dL 0.79 0.67 0.84 0.70 0.61   BP/Weight 06/12/2017 03/01/2017 11/22/2016 09/27/2016 08/23/2016 06/16/2016 1/82/8833  Systolic BP 744 514 604 - 799 872 158  Diastolic BP 84 78 80 - 82 84 82  Wt. (Lbs) 193 188 178 183.4 182.12 189 180  BMI 35.3 34.39 32.56 34.09 33.31 34.57 32.91   Foot/eye exam completion dates Latest Ref Rng & Units 10/22/2015 02/13/2015  Eye Exam No Retinopathy - No Retinopathy  Foot Form Completion - Done -

## 2017-06-13 NOTE — Assessment & Plan Note (Signed)
Improved , and controlled on current meds, continue same

## 2017-06-13 NOTE — Progress Notes (Signed)
Alicia Neal     MRN: [id number]      DOB: 16-Apr-1961   HPI Alicia Neal is here for follow up and re-evaluation of chronic medical conditions, medication management and review of any available recent lab and radiology data.  Preventive health is updated, specifically  Cancer screening and Immunization.   Questions or concerns regarding consultations or procedures which the PT has had in the interim are  addressed. The PT denies any adverse reactions to current medications since the last visit.  Disappointed re weight gain and elevated lipids. Went on high fat diet to improve blood sugar Exercise down to about 3 days per week Sleeps well Anxiety is less Has not done sleep study yet  ROS Denies recent fever or chills. Denies sinus pressure, nasal congestion, ear pain or sore throat. Denies chest congestion, productive cough or wheezing. Denies chest pains, palpitations and leg swelling Denies abdominal pain, nausea, vomiting,diarrhea or constipation.   Denies dysuria, frequency, hesitancy or incontinence. Denies joint pain, swelling and limitation in mobility. Denies headaches, seizures, numbness, or tingling. Denies uncontrolled  depression, anxiety or insomnia. Denies skin break down or rash.   PE  BP 124/84   Pulse 82   Resp 16   Ht 5\' 2"  (1.575 m)   Wt 193 lb (87.5 kg)   SpO2 97%   BMI 35.30 kg/m   Patient alert and oriented and in no cardiopulmonary distress.  HEENT: No facial asymmetry, EOMI,   oropharynx pink and moist.  Neck supple no JVD, no mass.  Chest: Clear to auscultation bilaterally.  CVS: S1, S2 no murmurs, no S3.Regular rate.  ABD: Soft non tender.   Ext: No edema  MS: Adequate ROM spine, shoulders, hips and knees.  Skin: Intact, no ulcerations or rash noted.  Psych: Good eye contact, normal affect. Memory intact not anxious or depressed appearing.  CNS: CN 2-12 intact, power,  normal throughout.no focal deficits noted.   Assessment &  Plan  HTN, goal below 130/80 Controlled, no change in medication DASH diet and commitment to daily physical activity for a minimum of 30 minutes discussed and encouraged, as a part of hypertension management. The importance of attaining a healthy weight is also discussed.  BP/Weight 06/12/2017 03/01/2017 11/22/2016 09/27/2016 08/23/2016 06/16/2016 7/56/4332  Systolic BP 951 884 166 - 063 016 010  Diastolic BP 84 78 80 - 82 84 82  Wt. (Lbs) 193 188 178 183.4 182.12 189 180  BMI 35.3 34.39 32.56 34.09 33.31 34.57 32.91       Type 2 diabetes mellitus with hemoglobin A1c goal of less than 7.0% (HCC) Controlled, no change in medication. Greatly improved and at goal Alicia Neal is reminded of the importance of commitment to daily physical activity for 30 minutes or more, as able and the need to limit carbohydrate intake to 30 to 60 grams per meal to help with blood sugar control.   The need to take medication as prescribed, test blood sugar as directed, and to call between visits if there is a concern that blood sugar is uncontrolled is also discussed.   Alicia Neal is reminded of the importance of daily foot exam, annual eye examination, and good blood sugar, blood pressure and cholesterol control.  Diabetic Labs Latest Ref Rng & Units 06/08/2017 02/27/2017 11/18/2016 08/22/2016 06/13/2016  HbA1c 4.8 - 5.6 % 6.8(H) 7.6(H) 7.2(H) 6.8(H) 6.8(H)  Microalbumin Not estab mg/dL - - - - -  Micro/Creat Ratio <30 mcg/mg creat - - - - -  Chol 0 - 200 mg/dL 247(H) 179 293(H) 264(H) 259(H)  HDL >40 mg/dL 58 40(L) 52 54 47  Calc LDL 0 - 99 mg/dL 146(H) 117(H) 211(H) 165(H) 159(H)  Triglycerides <150 mg/dL 213(H) 112 151(H) 226(H) 264(H)  Creatinine 0.44 - 1.00 mg/dL 0.79 0.67 0.84 0.70 0.61   BP/Weight 06/12/2017 03/01/2017 11/22/2016 09/27/2016 08/23/2016 06/16/2016 7/58/8325  Systolic BP 498 264 158 - 309 407 680  Diastolic BP 84 78 80 - 82 84 82  Wt. (Lbs) 193 188 178 183.4 182.12 189 180  BMI 35.3 34.39 32.56  34.09 33.31 34.57 32.91   Foot/eye exam completion dates Latest Ref Rng & Units 10/22/2015 02/13/2015  Eye Exam No Retinopathy - No Retinopathy  Foot Form Completion - Done -        Hyperlipidemia LDL goal <100 Deteriorated Has changed to high fat diet , needs to reverse this Hyperlipidemia:Low fat diet discussed and encouraged.   Lipid Panel  Lab Results  Component Value Date   CHOL 247 (H) 06/08/2017   HDL 58 06/08/2017   LDLCALC 146 (H) 06/08/2017   TRIG 213 (H) 06/08/2017   CHOLHDL 4.3 06/08/2017   Inc dose of livalo, rept in 3 months    Obesity (BMI 30.0-34.9) Deteriorated. Patient re-educated about  the importance of commitment to a  minimum of 150 minutes of exercise per week.  The importance of healthy food choices with portion control discussed. Encouraged to start a food diary, count calories and to consider  joining a support group. Sample diet sheets offered. Goals set by the patient for the next several months.   Weight /BMI 06/12/2017 03/01/2017 11/22/2016  WEIGHT 193 lb 188 lb 178 lb  HEIGHT 5\' 2"  5\' 2"  5\' 2"   BMI 35.3 kg/m2 34.39 kg/m2 32.56 kg/m2      GAD (generalized anxiety disorder) Improved , and controlled on current meds, continue same  Sleep apnea Still needs study, states she will get this

## 2017-06-13 NOTE — Assessment & Plan Note (Signed)
Deteriorated Has changed to high fat diet , needs to reverse this Hyperlipidemia:Low fat diet discussed and encouraged.   Lipid Panel  Lab Results  Component Value Date   CHOL 247 (H) 06/08/2017   HDL 58 06/08/2017   LDLCALC 146 (H) 06/08/2017   TRIG 213 (H) 06/08/2017   CHOLHDL 4.3 06/08/2017   Inc dose of livalo, rept in 3 months

## 2017-06-13 NOTE — Assessment & Plan Note (Signed)
Still needs study, states she will get this

## 2017-06-13 NOTE — Assessment & Plan Note (Signed)
Controlled, no change in medication DASH diet and commitment to daily physical activity for a minimum of 30 minutes discussed and encouraged, as a part of hypertension management. The importance of attaining a healthy weight is also discussed.  BP/Weight 06/12/2017 03/01/2017 11/22/2016 09/27/2016 08/23/2016 06/16/2016 05/07/7792  Systolic BP 903 009 233 - 007 622 633  Diastolic BP 84 78 80 - 82 84 82  Wt. (Lbs) 193 188 178 183.4 182.12 189 180  BMI 35.3 34.39 32.56 34.09 33.31 34.57 32.91

## 2017-06-13 NOTE — Assessment & Plan Note (Signed)
Deteriorated. Patient re-educated about  the importance of commitment to a  minimum of 150 minutes of exercise per week.  The importance of healthy food choices with portion control discussed. Encouraged to start a food diary, count calories and to consider  joining a support group. Sample diet sheets offered. Goals set by the patient for the next several months.   Weight /BMI 06/12/2017 03/01/2017 11/22/2016  WEIGHT 193 lb 188 lb 178 lb  HEIGHT 5\' 2"  5\' 2"  5\' 2"   BMI 35.3 kg/m2 34.39 kg/m2 32.56 kg/m2

## 2017-07-04 ENCOUNTER — Other Ambulatory Visit: Payer: Self-pay | Admitting: Family Medicine

## 2017-07-04 DIAGNOSIS — I10 Essential (primary) hypertension: Secondary | ICD-10-CM

## 2017-07-04 MED FILL — LOSARTAN POTASSIUM 25 MG TA: 25 | 90 days supply | Qty: 90 | Fill #0 | Status: TO

## 2017-07-05 ENCOUNTER — Other Ambulatory Visit: Payer: Self-pay | Admitting: Family Medicine

## 2017-07-06 ENCOUNTER — Other Ambulatory Visit: Payer: Self-pay | Admitting: Family Medicine

## 2017-07-06 MED FILL — CITALOPRAM HBR 20 MG TABLET: 20 | 90 days supply | Qty: 90 | Fill #0 | Status: TO

## 2017-08-14 MED FILL — ACCU-CHEK GUIDE TEST STRIP: 90 days supply | Qty: 100 | Fill #2 | Status: TO

## 2017-08-15 ENCOUNTER — Other Ambulatory Visit: Payer: Self-pay | Admitting: Family Medicine

## 2017-08-15 DIAGNOSIS — Z1231 Encounter for screening mammogram for malignant neoplasm of breast: Secondary | ICD-10-CM

## 2017-08-16 ENCOUNTER — Ambulatory Visit (HOSPITAL_COMMUNITY): Payer: 59

## 2017-08-17 ENCOUNTER — Ambulatory Visit (HOSPITAL_COMMUNITY)
Admission: RE | Admit: 2017-08-17 | Discharge: 2017-08-17 | Disposition: A | Discharge status: Home / Self Care | Payer: 59 | Source: Ambulatory Visit | Attending: Family Medicine | Admitting: Family Medicine

## 2017-08-17 DIAGNOSIS — Z1231 Encounter for screening mammogram for malignant neoplasm of breast: Secondary | ICD-10-CM | POA: Diagnosis not present

## 2017-08-21 MED FILL — METFORMIN HCL ER 500 MG TAB: 500 | 90 days supply | Qty: 90 | Fill #2

## 2017-08-31 MED FILL — MYCOPHENOLATE 500 MG TABLET: 500 | 60 days supply | Qty: 240 | Fill #0

## 2017-08-31 MED FILL — busPIRone HCL 5 MG TABS: 5 | 30 days supply | Qty: 90 | Fill #0

## 2017-08-31 MED FILL — LIVALO 1 MG TABLET: 1 | 90 days supply | Qty: 90 | Fill #0

## 2017-09-04 ENCOUNTER — Other Ambulatory Visit (HOSPITAL_COMMUNITY)
Admission: RE | Admit: 2017-09-04 | Discharge: 2017-09-04 | Disposition: A | Discharge status: Home / Self Care | Payer: 59 | Source: Ambulatory Visit | Attending: Family Medicine | Admitting: Family Medicine

## 2017-09-04 DIAGNOSIS — E119 Type 2 diabetes mellitus without complications: Secondary | ICD-10-CM | POA: Insufficient documentation

## 2017-09-04 DIAGNOSIS — E785 Hyperlipidemia, unspecified: Secondary | ICD-10-CM | POA: Diagnosis not present

## 2017-09-04 LAB — LIPID PANEL
CHOL/HDL RATIO: 3.9 ratio
Cholesterol: 210 mg/dL — ABNORMAL HIGH (ref 0–200)
HDL: 54 mg/dL (ref >40)
LDL Cholesterol: 115 mg/dL — ABNORMAL HIGH (ref 0–99)
Triglycerides: 204 mg/dL — ABNORMAL HIGH (ref <150)
VLDL: 41 mg/dL — AB (ref 0–40)

## 2017-09-04 LAB — COMPREHENSIVE METABOLIC PANEL
ALBUMIN: 4.2 g/dL (ref 3.5–5.0)
ALT: 24 U/L (ref 14–54)
ANION GAP: 9 (ref 5–15)
AST: 22 U/L (ref 15–41)
Alkaline Phosphatase: 51 U/L (ref 38–126)
BILIRUBIN TOTAL: 0.6 mg/dL (ref 0.3–1.2)
BUN: 16 mg/dL (ref 6–20)
CO2: 25 mmol/L (ref 22–32)
Calcium: 8.9 mg/dL (ref 8.9–10.3)
Chloride: 100 mmol/L — ABNORMAL LOW (ref 101–111)
Creatinine, Ser: 0.61 mg/dL (ref 0.44–1.00)
GFR calc Af Amer: >60 mL/min (ref >60)
GFR calc non Af Amer: >60 mL/min (ref >60)
GLUCOSE: 148 mg/dL — AB (ref 65–99)
POTASSIUM: 3.9 mmol/L (ref 3.5–5.1)
SODIUM: 134 mmol/L — AB (ref 135–145)
TOTAL PROTEIN: 6.8 g/dL (ref 6.5–8.1)

## 2017-09-05 LAB — HEMOGLOBIN A1C
Hgb A1c MFr Bld: 6.6 % — ABNORMAL HIGH (ref 4.8–5.6)
Mean Plasma Glucose: 143 mg/dL

## 2017-09-06 ENCOUNTER — Encounter: Payer: Self-pay | Admitting: Family Medicine

## 2017-09-06 ENCOUNTER — Ambulatory Visit (INDEPENDENT_AMBULATORY_CARE_PROVIDER_SITE_OTHER): Payer: 59 | Admitting: Family Medicine

## 2017-09-06 ENCOUNTER — Other Ambulatory Visit (HOSPITAL_COMMUNITY)
Admission: RE | Admit: 2017-09-06 | Discharge: 2017-09-06 | Disposition: A | Discharge status: Home / Self Care | Payer: 59 | Source: Other Acute Inpatient Hospital | Attending: Family Medicine | Admitting: Family Medicine

## 2017-09-06 VITALS — BP 120/82 | HR 88 | Resp 16 | Ht 62.0 in | Wt 186.0 lb

## 2017-09-06 DIAGNOSIS — E785 Hyperlipidemia, unspecified: Secondary | ICD-10-CM | POA: Diagnosis not present

## 2017-09-06 DIAGNOSIS — I10 Essential (primary) hypertension: Secondary | ICD-10-CM | POA: Diagnosis not present

## 2017-09-06 DIAGNOSIS — E669 Obesity, unspecified: Secondary | ICD-10-CM | POA: Diagnosis not present

## 2017-09-06 DIAGNOSIS — F411 Generalized anxiety disorder: Secondary | ICD-10-CM | POA: Diagnosis not present

## 2017-09-06 DIAGNOSIS — E119 Type 2 diabetes mellitus without complications: Secondary | ICD-10-CM | POA: Diagnosis not present

## 2017-09-06 DIAGNOSIS — E66811 Obesity, class 1: Secondary | ICD-10-CM

## 2017-09-06 MED ORDER — SITAGLIP PHOS-METFORMIN HCL ER 50-1000 MG PO TB24: ORAL_TABLET | ORAL | 3 refills | Status: DC

## 2017-09-06 MED ORDER — BUSPIRONE HCL 5 MG PO TABS: ORAL_TABLET | ORAL | 3 refills | Status: DC

## 2017-09-06 MED FILL — JANUMET XR 50-1,000 MG TAB: 50-1000 | 30 days supply | Qty: 60 | Fill #0

## 2017-09-06 NOTE — Patient Instructions (Addendum)
F/u in 3.5 months, call if you need me before  CONGRATS on marked improvement in all,aspectd of your healh and all the best for 2019!  No med changes  ALL meds to [facility]  LONG  Reduce fat in diet    Fasting lipid, cmp and eGFR, hBa1C, CBC in 3.5 months 1 week before visit please   Microalb today before you leave   Thank you  for choosing [facility]. We consider it a privelige to serve you.  Delivering excellent health care in a caring and  compassionate way is our goal.  Partnering with you,  so that together we can achieve this goal is our strategy.

## 2017-09-06 NOTE — Progress Notes (Signed)
Alicia Neal     MRN: [id number]      DOB: Jul 23, 1961   HPI Alicia Neal is here for follow up and re-evaluation of chronic medical conditions, medication management and review of any available recent lab and radiology data.  Preventive health is updated, specifically  Cancer screening and Immunization.   Questions or concerns regarding consultations or procedures which the PT has had in the interim are  addressed. The PT denies any adverse reactions to current medications since the last visit.  There are no new concerns.  There are no specific complaints  Denies polyuria, polydipsia, blurred vision , or hypoglycemic episodes.   ROS Denies recent fever or chills. Denies sinus pressure, nasal congestion, ear pain or sore throat. Denies chest congestion, productive cough or wheezing. Denies chest pains, palpitations and leg swelling Denies abdominal pain, nausea, vomiting,diarrhea or constipation.   Denies dysuria, frequency, hesitancy or incontinence. Denies joint pain, swelling and limitation in mobility. Denies headaches, seizures, numbness, or tingling. Denies depression, uncontrolled  anxiety or insomnia. Denies skin break down or rash.   PE  BP 120/82   Pulse 88   Resp 16   Ht 5\' 2"  (1.575 m)   Wt 186 lb (84.4 kg)   SpO2 97%   BMI 34.02 kg/m   Patient alert and oriented and in no cardiopulmonary distress.  HEENT: No facial asymmetry, EOMI,   oropharynx pink and moist.  Neck supple no JVD, no mass.  Chest: Clear to auscultation bilaterally.  CVS: S1, S2 no murmurs, no S3.Regular rate.  ABD: Soft non tender.   Ext: No edema  MS: Adequate ROM spine, shoulders, hips and knees.  Skin: Intact, no ulcerations or rash noted.  Psych: Good eye contact, normal affect. Memory intact not anxious or depressed appearing.  CNS: CN 2-12 intact, power,  normal throughout.no focal deficits noted.   Assessment & Plan  GAD (generalized anxiety disorder) Controlled, no  change in medication Takes buspar one at bedtime, improved greatly  HTN, goal below 130/80 .coin1 DASH diet and commitment to daily physical activity for a minimum of 30 minutes discussed and encouraged, as a part of hypertension management. The importance of attaining a healthy weight is also discussed.  BP/Weight 09/06/2017 06/12/2017 03/01/2017 11/22/2016 09/27/2016 08/23/2016 07/01/538  Systolic BP 767 341 937 902 - 409 735  Diastolic BP 82 84 78 80 - 82 84  Wt. (Lbs) 186 193 188 178 183.4 182.12 189  BMI 34.02 35.3 34.39 32.56 34.09 33.31 34.57       Hyperlipidemia LDL goal <100 Hyperlipidemia:Low fat diet discussed and encouraged.   Lipid Panel  Lab Results  Component Value Date   CHOL 210 (H) 09/04/2017   HDL 54 09/04/2017   LDLCALC 115 (H) 09/04/2017   TRIG 204 (H) 09/04/2017   CHOLHDL 3.9 09/04/2017   Improved     Obesity (BMI 30.0-34.9) Improved. Patient re-educated about  the importance of commitment to a  minimum of 150 minutes of exercise per week.  The importance of healthy food choices with portion control discussed. Encouraged to start a food diary, count calories and to consider  joining a support group. Sample diet sheets offered. Goals set by the patient for the next several months.   Weight /BMI 09/06/2017 06/12/2017 03/01/2017  WEIGHT 186 lb 193 lb 188 lb  HEIGHT 5\' 2"  5\' 2"  5\' 2"   BMI 34.02 kg/m2 35.3 kg/m2 34.39 kg/m2      Type 2 diabetes mellitus with hemoglobin A1c goal  of less than 7.0% ([place]) Improved Alicia Neal is reminded of the importance of commitment to daily physical activity for 30 minutes or more, as able and the need to limit carbohydrate intake to 30 to 60 grams per meal to help with blood sugar control.   The need to take medication as prescribed, test blood sugar as directed, and to call between visits if there is a concern that blood sugar is uncontrolled is also discussed.   Alicia Neal is reminded of the importance of daily  foot exam, annual eye examination, and good blood sugar, blood pressure and cholesterol control.  Diabetic Labs Latest Ref Rng & Units 09/04/2017 06/08/2017 02/27/2017 11/18/2016 08/22/2016  HbA1c 4.8 - 5.6 % 6.6(H) 6.8(H) 7.6(H) 7.2(H) 6.8(H)  Microalbumin Not estab mg/dL - - - - -  Micro/Creat Ratio <30 mcg/mg creat - - - - -  Chol 0 - 200 mg/dL 210(H) 247(H) 179 293(H) 264(H)  HDL >40 mg/dL 54 58 40(L) 52 54  Calc LDL 0 - 99 mg/dL 115(H) 146(H) 117(H) 211(H) 165(H)  Triglycerides <150 mg/dL 204(H) 213(H) 112 151(H) 226(H)  Creatinine 0.44 - 1.00 mg/dL 0.61 0.79 0.67 0.84 0.70   BP/Weight 09/06/2017 06/12/2017 03/01/2017 11/22/2016 09/27/2016 08/23/2016 2/33/4356  Systolic BP 861 683 729 021 - 115 520  Diastolic BP 82 84 78 80 - 82 84  Wt. (Lbs) 186 193 188 178 183.4 182.12 189  BMI 34.02 35.3 34.39 32.56 34.09 33.31 34.57   Foot/eye exam completion dates Latest Ref Rng & Units 10/22/2015 02/13/2015  Eye Exam No Retinopathy - No Retinopathy  Foot Form Completion - Done -      Controlled, no change in medication

## 2017-09-06 NOTE — Assessment & Plan Note (Signed)
.  coin1 DASH diet and commitment to daily physical activity for a minimum of 30 minutes discussed and encouraged, as a part of hypertension management. The importance of attaining a healthy weight is also discussed.  BP/Weight 09/06/2017 06/12/2017 03/01/2017 11/22/2016 09/27/2016 08/23/2016 1/76/1607  Systolic BP 371 062 694 854 - 627 035  Diastolic BP 82 84 78 80 - 82 84  Wt. (Lbs) 186 193 188 178 183.4 182.12 189  BMI 34.02 35.3 34.39 32.56 34.09 33.31 34.57

## 2017-09-06 NOTE — Assessment & Plan Note (Signed)
Hyperlipidemia:Low fat diet discussed and encouraged.   Lipid Panel  Lab Results  Component Value Date   CHOL 210 (H) 09/04/2017   HDL 54 09/04/2017   LDLCALC 115 (H) 09/04/2017   TRIG 204 (H) 09/04/2017   CHOLHDL 3.9 09/04/2017   Improved

## 2017-09-06 NOTE — Assessment & Plan Note (Signed)
Improved [name] is reminded of the importance of commitment to daily physical activity for 30 minutes or more, as able and the need to limit carbohydrate intake to 30 to 60 grams per meal to help with blood sugar control.   The need to take medication as prescribed, test blood sugar as directed, and to call between visits if there is a concern that blood sugar is uncontrolled is also discussed.   Alicia Neal is reminded of the importance of daily foot exam, annual eye examination, and good blood sugar, blood pressure and cholesterol control.  Diabetic Labs Latest Ref Rng & Units 09/04/2017 06/08/2017 02/27/2017 11/18/2016 08/22/2016  HbA1c 4.8 - 5.6 % 6.6(H) 6.8(H) 7.6(H) 7.2(H) 6.8(H)  Microalbumin Not estab mg/dL - - - - -  Micro/Creat Ratio <30 mcg/mg creat - - - - -  Chol 0 - 200 mg/dL 210(H) 247(H) 179 293(H) 264(H)  HDL >40 mg/dL 54 58 40(L) 52 54  Calc LDL 0 - 99 mg/dL 115(H) 146(H) 117(H) 211(H) 165(H)  Triglycerides <150 mg/dL 204(H) 213(H) 112 151(H) 226(H)  Creatinine 0.44 - 1.00 mg/dL 0.61 0.79 0.67 0.84 0.70   BP/Weight 09/06/2017 06/12/2017 03/01/2017 11/22/2016 09/27/2016 08/23/2016 06/20/4817  Systolic BP 563 149 702 637 - 858 850  Diastolic BP 82 84 78 80 - 82 84  Wt. (Lbs) 186 193 188 178 183.4 182.12 189  BMI 34.02 35.3 34.39 32.56 34.09 33.31 34.57   Foot/eye exam completion dates Latest Ref Rng & Units 10/22/2015 02/13/2015  Eye Exam No Retinopathy - No Retinopathy  Foot Form Completion - Done -      Controlled, no change in medication

## 2017-09-06 NOTE — Assessment & Plan Note (Signed)
Controlled, no change in medication Takes buspar one at bedtime, improved greatly

## 2017-09-06 NOTE — Assessment & Plan Note (Signed)
Improved. Patient re-educated about  the importance of commitment to a  minimum of 150 minutes of exercise per week.  The importance of healthy food choices with portion control discussed. Encouraged to start a food diary, count calories and to consider  joining a support group. Sample diet sheets offered. Goals set by the patient for the next several months.   Weight /BMI 09/06/2017 06/12/2017 03/01/2017  WEIGHT 186 lb 193 lb 188 lb  HEIGHT 5\' 2"  5\' 2"  5\' 2"   BMI 34.02 kg/m2 35.3 kg/m2 34.39 kg/m2

## 2017-09-07 LAB — MICROALBUMIN / CREATININE URINE RATIO: Creatinine, Urine: 28.9 mg/dL

## 2017-09-14 ENCOUNTER — Ambulatory Visit (HOSPITAL_COMMUNITY)
Admission: RE | Admit: 2017-09-14 | Discharge: 2017-09-14 | Disposition: A | Discharge status: Home / Self Care | Payer: 59 | Source: Ambulatory Visit | Attending: Acute Care | Admitting: Acute Care

## 2017-09-14 DIAGNOSIS — F17210 Nicotine dependence, cigarettes, uncomplicated: Secondary | ICD-10-CM

## 2017-09-14 DIAGNOSIS — K769 Liver disease, unspecified: Secondary | ICD-10-CM | POA: Diagnosis not present

## 2017-09-14 DIAGNOSIS — Z122 Encounter for screening for malignant neoplasm of respiratory organs: Secondary | ICD-10-CM | POA: Diagnosis not present

## 2017-09-14 DIAGNOSIS — D3501 Benign neoplasm of right adrenal gland: Secondary | ICD-10-CM | POA: Insufficient documentation

## 2017-09-14 DIAGNOSIS — Z87891 Personal history of nicotine dependence: Secondary | ICD-10-CM | POA: Insufficient documentation

## 2017-09-15 ENCOUNTER — Ambulatory Visit (HOSPITAL_COMMUNITY): Admission: RE | Admit: 2017-09-15 | Payer: 59 | Source: Ambulatory Visit

## 2017-09-18 ENCOUNTER — Other Ambulatory Visit: Payer: 59 | Admitting: Women's Health

## 2017-09-25 ENCOUNTER — Encounter: Payer: Self-pay | Admitting: Women's Health

## 2017-09-25 ENCOUNTER — Other Ambulatory Visit: Payer: Self-pay

## 2017-09-25 ENCOUNTER — Other Ambulatory Visit (HOSPITAL_COMMUNITY)
Admission: RE | Admit: 2017-09-25 | Discharge: 2017-09-25 | Disposition: A | Discharge status: Home / Self Care | Payer: 59 | Source: Ambulatory Visit | Attending: Obstetrics & Gynecology | Admitting: Obstetrics & Gynecology

## 2017-09-25 ENCOUNTER — Other Ambulatory Visit: Payer: Self-pay | Admitting: Acute Care

## 2017-09-25 ENCOUNTER — Ambulatory Visit (INDEPENDENT_AMBULATORY_CARE_PROVIDER_SITE_OTHER): Payer: 59 | Admitting: Women's Health

## 2017-09-25 VITALS — BP 132/86 | HR 63 | Ht 61.5 in | Wt 187.5 lb

## 2017-09-25 DIAGNOSIS — Z01419 Encounter for gynecological examination (general) (routine) without abnormal findings: Secondary | ICD-10-CM | POA: Diagnosis not present

## 2017-09-25 DIAGNOSIS — Z122 Encounter for screening for malignant neoplasm of respiratory organs: Secondary | ICD-10-CM

## 2017-09-25 DIAGNOSIS — Z87891 Personal history of nicotine dependence: Secondary | ICD-10-CM

## 2017-09-25 NOTE — Progress Notes (Signed)
   WELL-WOMAN EXAMINATION Patient name: Alicia Neal MRN [id number]  Date of birth: September 14, 1961 Chief Complaint:   Gynecologic Exam  History of Present Illness:   Alicia Neal is a 56 y.o. G41P2 Caucasian female being seen today for a breast exam/pap smear only. Has regular physicals w/ PCP, last one 09/06/17. Postmenopausal since app 56yo, no vaginal bleeding since. Had abnormal pap w/ colpo and cyrosurgery, w/ 4 subsequent normal paps. Not sexually active.  Current complaints: none  PCP: Dr. Moshe Cipro, manages all of chronic conditions including CHTN, DM, hyperlipidemia, depression/anxiety, she sees her q 17months    does not desire labs, done w/ PCP No LMP recorded. Patient is postmenopausal. The current method of family planning is tubal ligation, abstinence and post menopausal status Last pap 06/04/13. Results were: normal w/ -HRHPV Last mammogram: 08/17/17. Results were: normal Last colonoscopy: 8 yrs ago at 56yo. Results were: normal  Review of Systems:   Pertinent items are noted in HPI Denies any headaches, blurred vision, fatigue, shortness of breath, chest pain, abdominal pain, abnormal vaginal discharge/itching/odor/irritation, problems with periods, bowel movements, urination, or intercourse unless otherwise stated above. Pertinent History Reviewed:  Reviewed past medical,surgical, social and family history.  Reviewed problem list, medications and allergies. Physical Assessment:   Vitals:   09/25/17 1035  BP: 132/86  Pulse: 63  Weight: 187 lb 8 oz (85 kg)  Height: 5' 1.5" (1.562 m)  Body mass index is 34.85 kg/m.        Physical Examination:   General appearance - well appearing, and in no distress  Mental status - alert, oriented to person, place, and time  Psych:  She has a normal mood and affect  Skin - warm and dry, normal color, no suspicious lesions noted  Chest - effort normal  Heart - normal rate   Neck:  Not examined  Breasts - breasts appear normal, no  suspicious masses, no skin or nipple changes or  axillary nodes  Abdomen - soft, nontender, nondistended, no masses or organomegaly  Pelvic - VULVA: normal appearing vulva with no masses, tenderness or lesions  VAGINA: normal appearing vagina with normal color and discharge, no lesions  CERVIX: normal appearing cervix without discharge or lesions, no CMT  Thin prep pap is done w/ HR HPV cotesting  UTERUS: uterus is felt to be normal size, shape, consistency and nontender   ADNEXA: No adnexal masses or tenderness noted.  Rectal - denies problems, not examined  Extremities:  No swelling or varicosities noted  No results found for this or any previous visit (from the past 24 hour(s)).  Assessment & Plan:  1) Well-Woman Exam, breast exam & pap smear only  2) CHTN, Type2DM, Hyperlipidemia, dep/anx> all managed by PCP  3) Postmenopausal> reviewed to let us know if ever has any vaginal bleeding  4) H/O abnormal pap> w/ multiple normal paps since  Labs/procedures today: pap  Mammogram 75yr, or sooner if problems Colonoscopy @ 56yo per GI, or sooner if problems  No orders of the defined types were placed in this encounter.   Follow-up: Return in about 1 year (around 09/25/2018) for Physical.  Tawnya Crook CNM, [facility] 09/25/2017 11:12 AM

## 2017-09-26 LAB — CYTOLOGY - PAP
DIAGNOSIS: NEGATIVE
HPV (WINDOPATH): NOT DETECTED

## 2017-10-05 ENCOUNTER — Other Ambulatory Visit: Payer: Self-pay | Admitting: Family Medicine

## 2017-10-05 MED FILL — CITALOPRAM HBR 20 MG TABLET: 20 | 90 days supply | Qty: 90 | Fill #0

## 2017-10-05 MED FILL — VIT D2 1.25 MG (50,000 UNIT: 1.25 MG | 84 days supply | Qty: 12 | Fill #0

## 2017-10-05 MED FILL — JANUMET XR 50-1,000 MG TAB: 50-1000 | 30 days supply | Qty: 60 | Fill #1

## 2017-10-05 MED FILL — LOSARTAN POTASSIUM 25 MG TA: 25 | 90 days supply | Qty: 90 | Fill #0

## 2017-11-01 DIAGNOSIS — H524 Presbyopia: Secondary | ICD-10-CM | POA: Diagnosis not present

## 2017-11-01 DIAGNOSIS — H5213 Myopia, bilateral: Secondary | ICD-10-CM | POA: Diagnosis not present

## 2017-11-01 DIAGNOSIS — H52223 Regular astigmatism, bilateral: Secondary | ICD-10-CM | POA: Diagnosis not present

## 2017-11-02 MED FILL — ACCU-CHEK GUIDE TEST STRIP: 90 days supply | Qty: 100 | Fill #0

## 2017-11-02 MED FILL — JANUMET XR 50-1,000 MG TAB: 50-1000 | 30 days supply | Qty: 60 | Fill #2

## 2017-11-02 MED FILL — ACCU-CHEK FASTCLIX LANCETS: 90 days supply | Qty: 102 | Fill #0

## 2017-12-04 ENCOUNTER — Other Ambulatory Visit (HOSPITAL_COMMUNITY)
Admission: RE | Admit: 2017-12-04 | Discharge: 2017-12-04 | Disposition: A | Discharge status: Home / Self Care | Payer: 59 | Source: Ambulatory Visit | Attending: Family Medicine | Admitting: Family Medicine

## 2017-12-04 ENCOUNTER — Other Ambulatory Visit: Payer: Self-pay | Admitting: Family Medicine

## 2017-12-04 DIAGNOSIS — E119 Type 2 diabetes mellitus without complications: Secondary | ICD-10-CM | POA: Diagnosis not present

## 2017-12-04 DIAGNOSIS — I10 Essential (primary) hypertension: Secondary | ICD-10-CM | POA: Diagnosis not present

## 2017-12-04 DIAGNOSIS — E785 Hyperlipidemia, unspecified: Secondary | ICD-10-CM | POA: Insufficient documentation

## 2017-12-04 LAB — CBC
HCT: 41.9 % (ref 36.0–46.0)
HEMOGLOBIN: 13.3 g/dL (ref 12.0–15.0)
MCH: 29.9 pg (ref 26.0–34.0)
MCHC: 31.7 g/dL (ref 30.0–36.0)
MCV: 94.2 fL (ref 78.0–100.0)
Platelets: 314 10*3/uL (ref 150–400)
RBC: 4.45 MIL/uL (ref 3.87–5.11)
RDW: 13.1 % (ref 11.5–15.5)
WBC: 11 10*3/uL — ABNORMAL HIGH (ref 4.0–10.5)

## 2017-12-04 LAB — COMPREHENSIVE METABOLIC PANEL
ALK PHOS: 62 U/L (ref 38–126)
ALT: 27 U/L (ref 14–54)
AST: 23 U/L (ref 15–41)
Albumin: 4.3 g/dL (ref 3.5–5.0)
Anion gap: 12 (ref 5–15)
BILIRUBIN TOTAL: 0.8 mg/dL (ref 0.3–1.2)
BUN: 16 mg/dL (ref 6–20)
CALCIUM: 9.1 mg/dL (ref 8.9–10.3)
CO2: 26 mmol/L (ref 22–32)
CREATININE: 0.73 mg/dL (ref 0.44–1.00)
Chloride: 96 mmol/L — ABNORMAL LOW (ref 101–111)
Glucose, Bld: 162 mg/dL — ABNORMAL HIGH (ref 65–99)
Potassium: 4.1 mmol/L (ref 3.5–5.1)
Sodium: 134 mmol/L — ABNORMAL LOW (ref 135–145)
TOTAL PROTEIN: 7.1 g/dL (ref 6.5–8.1)

## 2017-12-04 LAB — HEMOGLOBIN A1C
HEMOGLOBIN A1C: 7.2 % — AB (ref 4.8–5.6)
MEAN PLASMA GLUCOSE: 159.94 mg/dL

## 2017-12-04 LAB — LIPID PANEL
CHOLESTEROL: 225 mg/dL — AB (ref 0–200)
HDL: 56 mg/dL (ref >40)
LDL CALC: 122 mg/dL — AB (ref 0–99)
TRIGLYCERIDES: 236 mg/dL — AB (ref <150)
Total CHOL/HDL Ratio: 4 RATIO
VLDL: 47 mg/dL — ABNORMAL HIGH (ref 0–40)

## 2017-12-04 MED FILL — busPIRone HCL 5 MG TABS: 5 | 30 days supply | Qty: 90 | Fill #1

## 2017-12-05 DIAGNOSIS — L720 Epidermal cyst: Secondary | ICD-10-CM | POA: Diagnosis not present

## 2017-12-05 DIAGNOSIS — Z5181 Encounter for therapeutic drug level monitoring: Secondary | ICD-10-CM | POA: Diagnosis not present

## 2017-12-05 DIAGNOSIS — L121 Cicatricial pemphigoid: Secondary | ICD-10-CM | POA: Diagnosis not present

## 2017-12-05 MED FILL — MYCOPHENOLATE 500 MG TABLET: 500 | 90 days supply | Qty: 90 | Fill #0

## 2017-12-05 MED FILL — SHIPPING COST: 1 days supply | Qty: 1 | Fill #0

## 2017-12-07 ENCOUNTER — Encounter: Payer: Self-pay | Admitting: Family Medicine

## 2017-12-07 ENCOUNTER — Ambulatory Visit (INDEPENDENT_AMBULATORY_CARE_PROVIDER_SITE_OTHER): Payer: 59 | Admitting: Family Medicine

## 2017-12-07 VITALS — BP 140/86 | HR 75 | Resp 16 | Ht 62.0 in | Wt 190.1 lb

## 2017-12-07 DIAGNOSIS — I10 Essential (primary) hypertension: Secondary | ICD-10-CM | POA: Diagnosis not present

## 2017-12-07 DIAGNOSIS — E669 Obesity, unspecified: Secondary | ICD-10-CM | POA: Diagnosis not present

## 2017-12-07 DIAGNOSIS — L121 Cicatricial pemphigoid: Secondary | ICD-10-CM

## 2017-12-07 DIAGNOSIS — E119 Type 2 diabetes mellitus without complications: Secondary | ICD-10-CM | POA: Diagnosis not present

## 2017-12-07 DIAGNOSIS — F5104 Psychophysiologic insomnia: Secondary | ICD-10-CM | POA: Diagnosis not present

## 2017-12-07 DIAGNOSIS — E559 Vitamin D deficiency, unspecified: Secondary | ICD-10-CM

## 2017-12-07 DIAGNOSIS — E785 Hyperlipidemia, unspecified: Secondary | ICD-10-CM | POA: Diagnosis not present

## 2017-12-07 DIAGNOSIS — K219 Gastro-esophageal reflux disease without esophagitis: Secondary | ICD-10-CM | POA: Diagnosis not present

## 2017-12-07 MED ORDER — BUSPIRONE HCL 5 MG PO TABS: ORAL_TABLET | ORAL | 3 refills | Status: DC

## 2017-12-07 MED ORDER — PITAVASTATIN CALCIUM 1 MG PO TABS: 1.0000 | ORAL_TABLET | Freq: Every day | ORAL | 3 refills | Status: DC

## 2017-12-07 MED FILL — LIVALO 1 MG TABLET: 1 | 90 days supply | Qty: 90 | Fill #0

## 2017-12-07 MED FILL — JANUMET XR 50-1,000 MG TAB: 50-1000 | 30 days supply | Qty: 60 | Fill #3

## 2017-12-07 NOTE — Patient Instructions (Signed)
F/u in 3.5 months  HBA1C, fasting lipid, cmp and EGFR  1 week before visit  Keep in close touch and find a support group    Thank you  for choosing [facility]. We consider it a privelige to serve you.  Delivering excellent health care in a caring and  compassionate way is our goal.  Partnering with you,  so that together we can achieve this goal is our strategy.

## 2017-12-10 ENCOUNTER — Encounter: Payer: Self-pay | Admitting: Family Medicine

## 2017-12-10 MED ORDER — ERGOCALCIFEROL 1.25 MG (50000 UT) PO CAPS: 50000.0000 [IU] | ORAL_CAPSULE | ORAL | 3 refills | Status: DC

## 2017-12-10 NOTE — Assessment & Plan Note (Addendum)
continue weekly supple,ment

## 2017-12-10 NOTE — Assessment & Plan Note (Addendum)
Controlled , no change in management 

## 2017-12-10 NOTE — Assessment & Plan Note (Signed)
Not at goal, no med change at this time, plan is 6 pound weigt loss DASH diet and commitment to daily physical activity for a minimum of 30 minutes discussed and encouraged, as a part of hypertension management. The importance of attaining a healthy weight is also discussed.  BP/Weight 12/07/2017 09/25/2017 09/06/2017 06/12/2017 03/01/2017 11/22/2016 91/47/8295  Systolic BP 621 308 657 846 962 952 -  Diastolic BP 86 86 82 84 78 80 -  Wt. (Lbs) 190.12 187.5 186 193 188 178 183.4  BMI 34.77 34.85 34.02 35.3 34.39 32.56 34.09

## 2017-12-10 NOTE — Assessment & Plan Note (Signed)
Sleep hygiene reviewed . Prescription sent for  medication needed.Uses buspar at bedtime, as needed

## 2017-12-10 NOTE — Progress Notes (Signed)
Alicia Neal     MRN: [id number]      DOB: 1961/01/17   HPI Ms. Sevcik is here for follow up and re-evaluation of chronic medical conditions, medication management and review of any available recent lab and radiology data.  Preventive health is updated, specifically  Cancer screening and Immunization.   Pt voluntarily stopped invokana due to possible adverse s/e which I agree with.  Denies polyuria, polydipsia, blurred vision , or hypoglycemic episodes. Frustrated with weight gain despite exercise regime Mad at being a diabetic and the accompanying food restrictions, was on keto diet and gained rather than lost weight     ROS Denies recent fever or chills. Denies sinus pressure, nasal congestion, ear pain or sore throat. Denies chest congestion, productive cough or wheezing. Denies chest pains, palpitations and leg swelling Denies abdominal pain, nausea, vomiting,diarrhea or constipation.   Denies dysuria, frequency, hesitancy or incontinence. Denies joint pain, swelling and limitation in mobility. Denies headaches, seizures, numbness, or tingling. C/o  depression, anxiety and mild insomnia. Denies skin break down or rash.   PE  BP 140/86   Pulse 75   Resp 16   Ht 5\' 2"  (1.575 m)   Wt 190 lb 1.9 oz (86.2 kg)   SpO2 98%   BMI 34.77 kg/m   Patient alert and oriented and in no cardiopulmonary distress.  HEENT: No facial asymmetry, EOMI,   oropharynx pink and moist.  Neck supple no JVD, no mass.  Chest: Clear to auscultation bilaterally.  CVS: S1, S2 no murmurs, no S3.Regular rate.  ABD: Soft non tender.   Ext: No edema  MS: Adequate ROM spine, shoulders, hips and knees.  Skin: Intact, no ulcerations or rash noted.  Psych: Good eye contact, normal affect. Memory intact mildly  anxious not  depressed appearing.  CNS: CN 2-12 intact, power,  normal throughout.no focal deficits noted.   Assessment & Plan  HTN, goal below 130/80 Not at goal, no med change at  this time, plan is 6 pound weigt loss DASH diet and commitment to daily physical activity for a minimum of 30 minutes discussed and encouraged, as a part of hypertension management. The importance of attaining a healthy weight is also discussed.  BP/Weight 12/07/2017 09/25/2017 09/06/2017 06/12/2017 03/01/2017 11/22/2016 93/79/0240  Systolic BP 973 532 992 426 834 196 -  Diastolic BP 86 86 82 84 78 80 -  Wt. (Lbs) 190.12 187.5 186 193 188 178 183.4  BMI 34.77 34.85 34.02 35.3 34.39 32.56 34.09       Hyperlipidemia LDL goal <100 Hyperlipidemia:Low fat diet discussed and encouraged.   Lipid Panel  Lab Results  Component Value Date   CHOL 225 (H) 12/04/2017   HDL 56 12/04/2017   LDLCALC 122 (H) 12/04/2017   TRIG 236 (H) 12/04/2017   CHOLHDL 4.0 12/04/2017   Uncontrolled ,needs to change diet Continue same dose of livalo Updated lab needed at/ before next visit.    Obesity (BMI 30.0-34.9) Deteriorated. Patient re-educated about  the importance of commitment to a  minimum of 150 minutes of exercise per week.  The importance of healthy food choices with portion control discussed. Encouraged to start a food diary, count calories and to consider  joining a support group. Sample diet sheets offered. Goals set by the patient for the next several months.   Weight /BMI 12/07/2017 09/25/2017 09/06/2017  WEIGHT 190 lb 1.9 oz 187 lb 8 oz 186 lb  HEIGHT 5\' 2"  5' 1.5" 5\' 2"   BMI  34.77 kg/m2 34.85 kg/m2 34.02 kg/m2      Insomnia Sleep hygiene reviewed . 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

## 2017-12-10 NOTE — Assessment & Plan Note (Signed)
Hyperlipidemia:Low fat diet discussed and encouraged.   Lipid Panel  Lab Results  Component Value Date   CHOL 225 (H) 12/04/2017   HDL 56 12/04/2017   LDLCALC 122 (H) 12/04/2017   TRIG 236 (H) 12/04/2017   CHOLHDL 4.0 12/04/2017   Uncontrolled ,needs to change diet Continue same dose of livalo Updated lab needed at/ before next visit.

## 2017-12-10 NOTE — Assessment & Plan Note (Signed)
Deteriorated. Patient re-educated about  the importance of commitment to a  minimum of 150 minutes of exercise per week.  The importance of healthy food choices with portion control discussed. Encouraged to start a food diary, count calories and to consider  joining a support group. Sample diet sheets offered. Goals set by the patient for the next several months.   Weight /BMI 12/07/2017 09/25/2017 09/06/2017  WEIGHT 190 lb 1.9 oz 187 lb 8 oz 186 lb  HEIGHT 5\' 2"  5' 1.5" 5\' 2"   BMI 34.77 kg/m2 34.85 kg/m2 34.02 kg/m2

## 2017-12-10 NOTE — Assessment & Plan Note (Signed)
No recent flare.  

## 2018-01-04 ENCOUNTER — Other Ambulatory Visit: Payer: Self-pay | Admitting: Family Medicine

## 2018-01-04 DIAGNOSIS — I10 Essential (primary) hypertension: Secondary | ICD-10-CM

## 2018-01-04 MED FILL — CITALOPRAM HBR 20 MG TABLET: 20 | 90 days supply | Qty: 90 | Fill #1

## 2018-01-04 MED FILL — JANUMET XR 50-1,000 MG TAB: 50-1000 | 30 days supply | Qty: 60 | Fill #4

## 2018-01-04 MED FILL — VIT D2 1.25 MG (50,000 UNIT: 1.25 MG | 84 days supply | Qty: 12 | Fill #0

## 2018-01-04 MED FILL — SHIPPING COST: 1 days supply | Qty: 1 | Fill #1

## 2018-01-04 MED FILL — LOSARTAN POTASSIUM 25 MG TA: 25 | 90 days supply | Qty: 90 | Fill #0

## 2018-01-05 ENCOUNTER — Encounter: Payer: Self-pay | Admitting: Family Medicine

## 2018-01-05 ENCOUNTER — Other Ambulatory Visit: Payer: Self-pay

## 2018-01-05 ENCOUNTER — Telehealth: Payer: Self-pay | Admitting: Family Medicine

## 2018-01-05 MED ORDER — GLUCOSE BLOOD VI STRP: ORAL_STRIP | 3 refills | Status: DC

## 2018-01-05 MED FILL — ACCU-CHEK GUIDE TEST STRIP: 90 days supply | Qty: 200 | Fill #0

## 2018-01-05 NOTE — Telephone Encounter (Signed)
Done

## 2018-01-05 NOTE — Telephone Encounter (Signed)
In response to a specific request from [facility], which I got today, she states that [facility] has advised she test twice daily since her FBG ids high I advised that since they are involved, hopefully ins will cover. Please DO go ahead and change the directions and strips to TWICE daily testing for her testing supplies, thanks!

## 2018-02-05 MED FILL — JANUMET XR 50-1,000 MG TAB: 50-1000 | 30 days supply | Qty: 60 | Fill #5

## 2018-02-05 MED FILL — SHIPPING COST: 1 days supply | Qty: 1 | Fill #2

## 2018-03-07 MED FILL — JANUMET XR 50-1,000 MG TAB: 50-1000 | 30 days supply | Qty: 60 | Fill #6

## 2018-03-07 MED FILL — busPIRone HCL 5 MG TABS: 5 | 30 days supply | Qty: 90 | Fill #2

## 2018-03-07 MED FILL — LIVALO 1 MG TABLET: 1 | 90 days supply | Qty: 90 | Fill #1

## 2018-03-07 MED FILL — SHIPPING COST: 1 days supply | Qty: 1 | Fill #3

## 2018-03-09 ENCOUNTER — Other Ambulatory Visit (HOSPITAL_COMMUNITY)
Admission: RE | Admit: 2018-03-09 | Discharge: 2018-03-09 | Disposition: A | Discharge status: Home / Self Care | Payer: 59 | Source: Ambulatory Visit | Attending: Family Medicine | Admitting: Family Medicine

## 2018-03-09 DIAGNOSIS — E785 Hyperlipidemia, unspecified: Secondary | ICD-10-CM | POA: Insufficient documentation

## 2018-03-09 DIAGNOSIS — E119 Type 2 diabetes mellitus without complications: Secondary | ICD-10-CM | POA: Diagnosis not present

## 2018-03-09 LAB — LIPID PANEL
Cholesterol: 229 mg/dL — ABNORMAL HIGH (ref 0–200)
HDL: 54 mg/dL (ref >40)
LDL CALC: 147 mg/dL — AB (ref 0–99)
TRIGLYCERIDES: 140 mg/dL (ref <150)
Total CHOL/HDL Ratio: 4.2 RATIO
VLDL: 28 mg/dL (ref 0–40)

## 2018-03-09 LAB — COMPREHENSIVE METABOLIC PANEL
ALK PHOS: 61 U/L (ref 38–126)
ALT: 56 U/L — ABNORMAL HIGH (ref 14–54)
AST: 41 U/L (ref 15–41)
Albumin: 4.4 g/dL (ref 3.5–5.0)
Anion gap: 13 (ref 5–15)
BUN: 15 mg/dL (ref 6–20)
CHLORIDE: 100 mmol/L — AB (ref 101–111)
CO2: 22 mmol/L (ref 22–32)
CREATININE: 0.64 mg/dL (ref 0.44–1.00)
Calcium: 9.1 mg/dL (ref 8.9–10.3)
GFR calc Af Amer: >60 mL/min (ref >60)
Glucose, Bld: 191 mg/dL — ABNORMAL HIGH (ref 65–99)
Potassium: 4.3 mmol/L (ref 3.5–5.1)
Sodium: 135 mmol/L (ref 135–145)
Total Bilirubin: 0.8 mg/dL (ref 0.3–1.2)
Total Protein: 7.3 g/dL (ref 6.5–8.1)

## 2018-03-09 LAB — HEMOGLOBIN A1C
Hgb A1c MFr Bld: 8 % — ABNORMAL HIGH (ref 4.8–5.6)
Mean Plasma Glucose: 182.9 mg/dL

## 2018-03-15 ENCOUNTER — Encounter: Payer: Self-pay | Admitting: Family Medicine

## 2018-03-15 ENCOUNTER — Ambulatory Visit (INDEPENDENT_AMBULATORY_CARE_PROVIDER_SITE_OTHER): Payer: 59 | Admitting: Family Medicine

## 2018-03-15 ENCOUNTER — Other Ambulatory Visit: Payer: Self-pay

## 2018-03-15 VITALS — BP 120/80 | HR 72 | Ht 61.5 in | Wt 193.0 lb

## 2018-03-15 DIAGNOSIS — E1159 Type 2 diabetes mellitus with other circulatory complications: Secondary | ICD-10-CM | POA: Diagnosis not present

## 2018-03-15 DIAGNOSIS — R9389 Abnormal findings on diagnostic imaging of other specified body structures: Secondary | ICD-10-CM | POA: Diagnosis not present

## 2018-03-15 DIAGNOSIS — E785 Hyperlipidemia, unspecified: Secondary | ICD-10-CM

## 2018-03-15 DIAGNOSIS — I2510 Atherosclerotic heart disease of native coronary artery without angina pectoris: Secondary | ICD-10-CM | POA: Diagnosis not present

## 2018-03-15 DIAGNOSIS — Z0000 Encounter for general adult medical examination without abnormal findings: Secondary | ICD-10-CM | POA: Insufficient documentation

## 2018-03-15 DIAGNOSIS — E119 Type 2 diabetes mellitus without complications: Secondary | ICD-10-CM

## 2018-03-15 DIAGNOSIS — I152 Hypertension secondary to endocrine disorders: Secondary | ICD-10-CM

## 2018-03-15 DIAGNOSIS — I10 Essential (primary) hypertension: Secondary | ICD-10-CM

## 2018-03-15 MED ORDER — GLIPIZIDE ER 5 MG PO TB24: 5.0000 mg | ORAL_TABLET | Freq: Every day | ORAL | 1 refills | Status: DC

## 2018-03-15 MED FILL — SHIPPING COST: 1 days supply | Qty: 1 | Fill #4

## 2018-03-15 MED FILL — glipiZIDE ER 5 MG TB24: 5 | 90 days supply | Qty: 90 | Fill #0

## 2018-03-15 NOTE — Assessment & Plan Note (Signed)

## 2018-03-15 NOTE — Assessment & Plan Note (Signed)
Controlled, no change in medication DASH diet and commitment to daily physical activity for a minimum of 30 minutes discussed and encouraged, as a part of hypertension management. The importance of attaining a healthy weight is also discussed.  BP/Weight 03/15/2018 12/07/2017 09/25/2017 09/06/2017 06/12/2017 03/01/2017 6/96/2952  Systolic BP 841 324 401 027 253 664 403  Diastolic BP 80 86 86 82 84 78 80  Wt. (Lbs) 193 190.12 187.5 186 193 188 178  BMI 35.88 34.77 34.85 34.02 35.3 34.39 32.56

## 2018-03-15 NOTE — Assessment & Plan Note (Addendum)
Deteriorated. Patient re-educated about  the importance of commitment to a  minimum of 150 minutes of exercise per week.  She is being referred to and agrees to go to the diabetic educator. .   Weight /BMI 03/15/2018 12/07/2017 09/25/2017  WEIGHT 193 lb 190 lb 1.9 oz 187 lb 8 oz  HEIGHT 5' 1.5" 5\' 2"  5' 1.5"  BMI 35.88 kg/m2 34.77 kg/m2 34.85 kg/m2

## 2018-03-15 NOTE — Assessment & Plan Note (Signed)
Deteriorated and uncontrolled. Sart additionally glipizide 5 mg dail Refr to diabetic educator, I explained how valuable this will be Alicia Neal is reminded of the importance of commitment to daily physical activity for 30 minutes or more, as able and the need to limit carbohydrate intake to 30 to 60 grams per meal to help with blood sugar control.   The need to take medication as prescribed, test blood sugar as directed, and to call between visits if there is a concern that blood sugar is uncontrolled is also discussed.   Alicia Neal is reminded of the importance of daily foot exam, annual eye examination, and good blood sugar, blood pressure and cholesterol control.  Diabetic Labs Latest Ref Rng & Units 03/09/2018 12/04/2017 09/06/2017 09/04/2017 06/08/2017  HbA1c 4.8 - 5.6 % 8.0(H) 7.2(H) - 6.6(H) 6.8(H)  Microalbumin Not Estab. ug/mL - - <3.0(H) - -  Micro/Creat Ratio 0.0 - 30.0 mg/g creat - - <10.4 - -  Chol 0 - 200 mg/dL 229(H) 225(H) - 210(H) 247(H)  HDL >40 mg/dL 54 56 - 54 58  Calc LDL 0 - 99 mg/dL 147(H) 122(H) - 115(H) 146(H)  Triglycerides <150 mg/dL 140 236(H) - 204(H) 213(H)  Creatinine 0.44 - 1.00 mg/dL 0.64 0.73 - 0.61 0.79   BP/Weight 03/15/2018 12/07/2017 09/25/2017 09/06/2017 06/12/2017 03/01/2017 6/71/2458  Systolic BP 099 833 825 053 976 734 193  Diastolic BP 80 86 86 82 84 78 80  Wt. (Lbs) 193 190.12 187.5 186 193 188 178  BMI 35.88 34.77 34.85 34.02 35.3 34.39 32.56   Foot/eye exam completion dates Latest Ref Rng & Units 03/15/2018 10/22/2015  Eye Exam No Retinopathy - -  Foot Form Completion - Done Done  She is also encouraged to send in FBG every 2 weeks, states she just gets frustrated

## 2018-03-15 NOTE — Assessment & Plan Note (Addendum)
Deteriorated and uncontrolled , refer diabetic educator, statin intolerant,no change in medication Hyperlipidemia:Low fat diet discussed and encouraged Good that she walks regularly   Lipid Panel  Lab Results  Component Value Date   CHOL 229 (H) 03/09/2018   HDL 54 03/09/2018   LDLCALC 147 (H) 03/09/2018   TRIG 140 03/09/2018   CHOLHDL 4.2 03/09/2018   CV re eval

## 2018-03-15 NOTE — Progress Notes (Signed)
Alicia Neal     MRN: [id number]      DOB: August 22, 1961   HPI Alicia Neal is here for follow up and re-evaluation of chronic medical conditions, medication management and review of any available recent lab and radiology data.  Preventive health is updated, specifically  Cancer screening and Immunization.   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 States FBG is seldom under 270 although she follows a strict diet , she eats mainly hamburger meat and is surprised that her cholesterol has not improved, and is frustrated that her weight is not decreasing and her abdominal size is increasing. SHe has past h/o nicotine use, has coronary artery calcification on chest scan, and although she walks daily with no overt symptoms suggestive of CAD re evaluation by cardiology is appropriate  ROS Denies recent fever or chills. Denies sinus pressure, nasal congestion, ear pain or sore throat. Denies chest congestion, productive cough or wheezing. Denies chest pains, palpitations and leg swelling Denies abdominal pain, nausea, vomiting,diarrhea or constipation.   Denies dysuria, frequency, hesitancy or incontinence. Denies joint pain, swelling and limitation in mobility. Denies headaches, seizures, numbness, or tingling. Denies skin break down or rash.   PE  BP 120/80   Pulse 72   Ht 5' 1.5" (1.562 m)   Wt 193 lb (87.5 kg)   SpO2 95%   BMI 35.88 kg/m   Patient alert and oriented and in no cardiopulmonary distress.  HEENT: No facial asymmetry, EOMI,   oropharynx pink and moist.  Neck supple no JVD, no mass.  Chest: Clear to auscultation bilaterally.  CVS: S1, S2 no murmurs, no S3.Regular rate.  ABD: Soft non tender.   Ext: No edema  MS: Adequate ROM spine, shoulders, hips and knees.  Skin: Intact, no ulcerations or rash noted.  Psych: Good eye contact, normal affect.  Memory intact not anxious or depressed appearing.  CNS: CN 2-12 intact, power,  normal throughout.no focal deficits noted.   Assessment & Plan  Annual physical exam Annual exam as documented. Counseling done  re healthy lifestyle involving commitment to 150 minutes exercise per week, heart healthy diet, and attaining healthy weight.The importance of adequate sleep also discussed. Regular seat belt use and home safety, is also discussed. Changes in health habits are decided on by the patient with goals and time frames  set for achieving them. Immunization and cancer screening needs are specifically addressed at this visit.   Hypertension associated with type 2 diabetes mellitus (HCC) Controlled, no change in medication DASH diet and commitment to daily physical activity for a minimum of 30 minutes discussed and encouraged, as a part of hypertension management. The importance of attaining a healthy weight is also discussed.  BP/Weight 03/15/2018 12/07/2017 09/25/2017 09/06/2017 06/12/2017 03/01/2017 01/20/4007  Systolic BP 676 195 093 267 124 580 998  Diastolic BP 80 86 86 82 84 78 80  Wt. (Lbs) 193 190.12 187.5 186 193 188 178  BMI 35.88 34.77 34.85 34.02 35.3 34.39 32.56       Hyperlipidemia LDL goal <100 Deteriorated and uncontrolled , refer diabetic educator, statin intolerant,no change in medication Hyperlipidemia:Low fat diet discussed and encouraged Good that she walks regularly   Lipid Panel  Lab Results  Component Value Date   CHOL 229 (H) 03/09/2018   HDL 54 03/09/2018   LDLCALC 147 (H) 03/09/2018   TRIG 140 03/09/2018  CHOLHDL 4.2 03/09/2018   CV re eval    Morbid obesity ([place]) Deteriorated. Patient re-educated about  the importance of commitment to a  minimum of 150 minutes of exercise per week.  She is being referred to and agrees to go to the diabetic educator. .   Weight /BMI 03/15/2018 12/07/2017 09/25/2017  WEIGHT 193 lb 190 lb 1.9 oz 187 lb 8 oz  HEIGHT  5' 1.5" 5\' 2"  5' 1.5"  BMI 35.88 kg/m2 34.77 kg/m2 34.85 kg/m2      Type 2 diabetes mellitus with hemoglobin A1c goal of less than 7.0% (HCC) Deteriorated and uncontrolled. Sart additionally glipizide 5 mg dail Refr to diabetic educator, I explained how valuable this will be Alicia Neal is reminded of the importance of commitment to daily physical activity for 30 minutes or more, as able and the need to limit carbohydrate intake to 30 to 60 grams per meal to help with blood sugar control.   The need to take medication as prescribed, test blood sugar as directed, and to call between visits if there is a concern that blood sugar is uncontrolled is also discussed.   Alicia Neal is reminded of the importance of daily foot exam, annual eye examination, and good blood sugar, blood pressure and cholesterol control.  Diabetic Labs Latest Ref Rng & Units 03/09/2018 12/04/2017 09/06/2017 09/04/2017 06/08/2017  HbA1c 4.8 - 5.6 % 8.0(H) 7.2(H) - 6.6(H) 6.8(H)  Microalbumin Not Estab. ug/mL - - <3.0(H) - -  Micro/Creat Ratio 0.0 - 30.0 mg/g creat - - <10.4 - -  Chol 0 - 200 mg/dL 229(H) 225(H) - 210(H) 247(H)  HDL >40 mg/dL 54 56 - 54 58  Calc LDL 0 - 99 mg/dL 147(H) 122(H) - 115(H) 146(H)  Triglycerides <150 mg/dL 140 236(H) - 204(H) 213(H)  Creatinine 0.44 - 1.00 mg/dL 0.64 0.73 - 0.61 0.79   BP/Weight 03/15/2018 12/07/2017 09/25/2017 09/06/2017 06/12/2017 03/01/2017 7/49/4496  Systolic BP 759 163 846 659 935 701 779  Diastolic BP 80 86 86 82 84 78 80  Wt. (Lbs) 193 190.12 187.5 186 193 188 178  BMI 35.88 34.77 34.85 34.02 35.3 34.39 32.56   Foot/eye exam completion dates Latest Ref Rng & Units 03/15/2018 10/22/2015  Eye Exam No Retinopathy - -  Foot Form Completion - Done Done  She is also encouraged to send in FBG every 2 weeks, states she just gets frustrated

## 2018-03-15 NOTE — Patient Instructions (Addendum)
F/u in 13 weeks, call if you need me before  Fasting HBA1C, chem7 and EGFR   FASTING LIPID IN 12 WEEKS  You are referred to diabetic educator and I will reach out to her personally on your behalf also and message you  Three stool cards from office today for colon cancer screen  You are being referred to cardiologist for CV screening based on metabolic profile and abn chest scan GREAT that you WALK DAILY AND THAT YOU HAVE STOPPED smoking  Great THAT YOU ARE GOING TO THE NUTRITIONIIST  NO SENSORY LOSS, NO LOSS OF KIDNEY FUNCTION  START GLIPIZIDE 5 MG ONE DAILY AT [place]  Thank you  for choosing [facility]. We consider it a privelige to serve you.  Delivering excellent health care in a caring and  compassionate way is our goal.  Partnering with you,  so that together we can achieve this goal is our strategy.

## 2018-03-16 NOTE — Addendum Note (Signed)
Addended by: Eual Fines on: 03/16/2018 11:18 AM   Modules accepted: Orders

## 2018-03-26 ENCOUNTER — Ambulatory Visit: Payer: 59 | Admitting: Cardiovascular Disease

## 2018-03-26 ENCOUNTER — Encounter: Payer: Self-pay | Admitting: Cardiovascular Disease

## 2018-03-26 VITALS — BP 140/82 | HR 80 | Ht 61.5 in | Wt 194.2 lb

## 2018-03-26 DIAGNOSIS — E1159 Type 2 diabetes mellitus with other circulatory complications: Secondary | ICD-10-CM

## 2018-03-26 DIAGNOSIS — E785 Hyperlipidemia, unspecified: Secondary | ICD-10-CM | POA: Diagnosis not present

## 2018-03-26 DIAGNOSIS — E119 Type 2 diabetes mellitus without complications: Secondary | ICD-10-CM | POA: Diagnosis not present

## 2018-03-26 DIAGNOSIS — I2510 Atherosclerotic heart disease of native coronary artery without angina pectoris: Secondary | ICD-10-CM | POA: Diagnosis not present

## 2018-03-26 DIAGNOSIS — I10 Essential (primary) hypertension: Secondary | ICD-10-CM | POA: Diagnosis not present

## 2018-03-26 DIAGNOSIS — Z713 Dietary counseling and surveillance: Secondary | ICD-10-CM

## 2018-03-26 DIAGNOSIS — I152 Hypertension secondary to endocrine disorders: Secondary | ICD-10-CM

## 2018-03-26 NOTE — Progress Notes (Signed)
CARDIOLOGY CONSULT NOTE  Patient ID: Alicia Neal MRN: [id number] DOB/AGE: 59-Dec-1962 59 y.o.  Admit date: (Not on file) Primary Physician: Fayrene Helper, MD Referring Physician: Dr. Moshe Cipro  Reason for Consultation: Cardiovascular risk assessment, coronary artery calcifications  HPI: Alicia Neal is a 57 y.o. female who is being seen today for the evaluation of coronary artery calcifications and cardiovascular risk assessment at the request of Fayrene Helper, MD.   Past medical history includes type 2 diabetes mellitus, hypertension, obesity, depression, and a prior history of tobacco abuse.  She is reportedly statin intolerant.  She underwent a lung cancer screening chest CT on 09/14/2017.  This demonstrated coronary artery calcifications and atherosclerotic calcifications in the wall of the thoracic aorta.  There were right adrenal nodules and bilateral pulmonary nodules, although none were worrisome.  Upon review of the electronic medical record, it appears she underwent a nuclear stress test on 04/30/2003 which demonstrated a small focus of decreased perfusion on the rest images in the mid to distal anterior wall, "worrisome for ischemia ".  Left ventricular ejection fraction was reportedly normal.  ECG performed in the office today which I ordered and personally interpreted demonstrates normal sinus rhythm with no ischemic ST segment or T-wave abnormalities, nor any arrhythmias.  The patient denies any symptoms of chest pain, palpitations, shortness of breath, lightheadedness, dizziness, leg swelling, orthopnea, PND, and syncope.  She said she walks quite a bit on a near daily basis.  She eats fairly healthy throughout the week but her biggest struggle is on the weekends.  She gets together with friends and eats socially on Fridays, Saturdays, and Sundays.  She eats hamburgers, drinks beer, eats sweets, and fried foods.  She said her grandmother had a stroke  and she is of a similar body habitus and she is afraid of having a stroke.  She denies exertional fatigue and energy levels remain consistent over the past 12 months.  She has tried several statins in the past and may lead to myalgias and joint pains.  She is currently on Livalo 1 mg every evening and has been taking it for approximately 6 months.    Allergies  Allergen Reactions  . Ezetimibe-Simvastatin Other (See Comments)    REACTION: muscle aches  . Pravachol [Pravastatin Sodium] Other (See Comments)    Joint pains and memory loss  . Simvastatin Other (See Comments)    REACTION: muscle aches  . Zetia [Ezetimibe] Other (See Comments)    Generalized joint pains    Current Outpatient Medications  Medication Sig Dispense Refill  . busPIRone (BUSPAR) 5 MG tablet One tablet at bedtime , as needed 90 tablet 3  . citalopram (CELEXA) 20 MG tablet TAKE 1 TABLET BY MOUTH ONCE DAILY 90 tablet 3  . ergocalciferol (VITAMIN D2) 50000 units capsule Take 1 capsule (50,000 Units total) by mouth once a week. One capsule once weekly 12 capsule 3  . fluticasone (FLONASE) 50 MCG/ACT nasal spray Place 2 sprays into both nostrils daily. (Patient taking differently: Place 2 sprays into both nostrils daily as needed for allergies. ) 16 g 3  . glipiZIDE (GLUCOTROL XL) 5 MG 24 hr tablet Take 1 tablet (5 mg total) by mouth daily with breakfast. 90 tablet 1  . glucose blood (ACCU-CHEK GUIDE) test strip USE TO CHECK BLOOD SUGAR twice daily  (DX E11.9) 200 each 3  . Lancet Devices (ACCU-CHEK SOFTCLIX) lancets Use as instructed once daily dx e11.9 3 each  5  . losartan (COZAAR) 25 MG tablet TAKE 1 TABLET BY MOUTH DAILY. 90 tablet 0  . mycophenolate (CELLCEPT) 500 MG tablet Take 500 mg by mouth daily.     . pantoprazole (PROTONIX) 40 MG tablet Take 40 mg by mouth as needed.    . Pitavastatin Calcium (LIVALO) 1 MG TABS Take 1 tablet (1 mg total) by mouth at bedtime. 90 tablet 3  . SitaGLIPtin-MetFORMIN HCl 50-1000 MG  TB24 Two tablets once daily 180 tablet 3   No current facility-administered medications for this visit.     Past Medical History:  Diagnosis Date  . Adrenal adenoma    right side  . Allergic rhinitis   . Anxiety disorder   . Carpal tunnel syndrome of left wrist   . Chronic depression    Dr. Cheryln Manly  . GERD (gastroesophageal reflux disease)   . History of abnormal cervical Pap smear   . History of adenomatous polyp of colon   . History of hiatal hernia   . Hyperlipidemia   . Hypertension   . IBS (irritable bowel syndrome)   . OSA (obstructive sleep apnea)    per pt study yrs ago-- CPAP intolerant   . Pemphigoid, cicatricial    pruritic autoimmune blistering skin disorder  . PONV (postoperative nausea and vomiting)   . Pulmonary nodule, right pulmologist-  dr Nathaneil Canary mcquaid   x2    per CT 09-11-2015  . Trigger finger, left    ring finger  . Type 2 diabetes mellitus ([place])   . Wears glasses     Past Surgical History:  Procedure Laterality Date  . CARDIOVASCULAR STRESS TEST  04-30-2003   Small focus of decreased perfusion on rest images in the mid-distal anterior wall (worrisome for ischemia),  normal LV function and wall motion , ef 60%  . CARPAL TUNNEL RELEASE Left 11/26/2015   Procedure: CARPAL TUNNEL RELEASE;  Surgeon: Iran Planas, MD;  Location: [place];  Service: Orthopedics;  Laterality: Left;  . CHOLECYSTECTOMY  1989  . TRANSTHORACIC ECHOCARDIOGRAM  04-30-2003   mild hypokinetic in the inferior and posterior wall, ef 50%/  trivial MR /  mild TR  . TRIGGER FINGER RELEASE Left 11/26/2015   Procedure: RELEASE TRIGGER FINGER/A-1 PULLEY LEFT RING  FINGER;  Surgeon: Iran Planas, MD;  Location: [place];  Service: Orthopedics;  Laterality: Left;  . TUBAL LIGATION  2000    Social History   Socioeconomic History  . Marital status: Legally Separated    Spouse name: Not on file  . Number of children: 1  . Years of education: Not on file  . Highest education level: Not on  file  Occupational History  . Not on file  Social Needs  . Financial resource strain: Not on file  . Food insecurity:    Worry: Not on file    Inability: Not on file  . Transportation needs:    Medical: Not on file    Non-medical: Not on file  Tobacco Use  . Smoking status: Former Smoker    Packs/day: 1.00    Years: 40.00    Pack years: 40.00    Types: Cigarettes    Last attempt to quit: 11/07/2013    Years since quitting: 4.3  . Smokeless tobacco: Never Used  Substance and Sexual Activity  . Alcohol use: Yes    Alcohol/week: 0.0 oz    Comment: occasional  . Drug use: No  . Sexual activity: Not Currently    Birth control/protection: Post-menopausal  Lifestyle  . Physical activity:    Days per week: Not on file    Minutes per session: Not on file  . Stress: Not on file  Relationships  . Social connections:    Talks on phone: Not on file    Gets together: Not on file    Attends religious service: Not on file    Active member of club or organization: Not on file    Attends meetings of clubs or organizations: Not on file    Relationship status: Not on file  . Intimate partner violence:    Fear of current or ex partner: Not on file    Emotionally abused: Not on file    Physically abused: Not on file    Forced sexual activity: Not on file  Other Topics Concern  . Not on file  Social History Narrative  . Not on file     No family history of premature CAD in 1st degree relatives.  Current Meds  Medication Sig  . busPIRone (BUSPAR) 5 MG tablet One tablet at bedtime , as needed  . citalopram (CELEXA) 20 MG tablet TAKE 1 TABLET BY MOUTH ONCE DAILY  . ergocalciferol (VITAMIN D2) 50000 units capsule Take 1 capsule (50,000 Units total) by mouth once a week. One capsule once weekly  . fluticasone (FLONASE) 50 MCG/ACT nasal spray Place 2 sprays into both nostrils daily. (Patient taking differently: Place 2 sprays into both nostrils daily as needed for allergies. )  .  glipiZIDE (GLUCOTROL XL) 5 MG 24 hr tablet Take 1 tablet (5 mg total) by mouth daily with breakfast.  . glucose blood (ACCU-CHEK GUIDE) test strip USE TO CHECK BLOOD SUGAR twice daily  (DX E11.9)  . Lancet Devices (ACCU-CHEK SOFTCLIX) lancets Use as instructed once daily dx e11.9  . losartan (COZAAR) 25 MG tablet TAKE 1 TABLET BY MOUTH DAILY.  . mycophenolate (CELLCEPT) 500 MG tablet Take 500 mg by mouth daily.   . pantoprazole (PROTONIX) 40 MG tablet Take 40 mg by mouth as needed.  . Pitavastatin Calcium (LIVALO) 1 MG TABS Take 1 tablet (1 mg total) by mouth at bedtime.  . SitaGLIPtin-MetFORMIN HCl 50-1000 MG TB24 Two tablets once daily      Review of systems complete and found to be negative unless listed above in HPI    Physical exam Blood pressure 140/82, pulse 80, height 5' 1.5" (1.562 m), weight 194 lb 3.2 oz (88.1 kg), SpO2 93 %. General: NAD Neck: No JVD, no thyromegaly or thyroid nodule.  Lungs: Clear to auscultation bilaterally with normal respiratory effort. CV: Nondisplaced PMI. Regular rate and rhythm, normal S1/S2, no S3/S4, no murmur.  No peripheral edema.  No carotid bruit.   Abdomen: Soft, nontender, no distention.  Skin: Intact without lesions or rashes.  Neurologic: Alert and oriented x 3.  Psych: Normal affect. Extremities: No clubbing or cyanosis.  HEENT: Normal.   ECG: Most recent ECG reviewed.   Labs: Lab Results  Component Value Date/Time   K 4.3 03/09/2018 08:49 AM   BUN 15 03/09/2018 08:49 AM   CREATININE 0.64 03/09/2018 08:49 AM   CREATININE 0.84 11/18/2016 08:44 AM   ALT 56 (H) 03/09/2018 08:49 AM   TSH 1.724 06/08/2017 08:41 AM   TSH 1.739 06/23/2014 08:38 AM   HGB 13.3 12/04/2017 08:44 AM     Lipids: Lab Results  Component Value Date/Time   LDLCALC 147 (H) 03/09/2018 08:50 AM   CHOL 229 (H) 03/09/2018 08:50 AM   TRIG 140  03/09/2018 08:50 AM   HDL 54 03/09/2018 08:50 AM        ASSESSMENT AND PLAN:   1.  Coronary artery  calcifications: Noted on chest CT as detailed above.  Symptomatically stable.  ECG is normal.  She has several cardiovascular risk factors including hypertension, hyperlipidemia, and type 2 diabetes mellitus.  We talked about the importance of primary prevention the risk factor modification.  We talked about dietary modification in particular and achieving accountability with a friend who has similar goals.  We talked about using resources on the [employer] ([facility]). If she were to ever develop symptoms such as exertional chest pain, dyspnea, or fatigue, I would then consider noninvasive cardiac testing.  2.  Hyperlipidemia: Lipids 03/09/2018 showed total cholesterol elevated at 229, triglycerides 140, HDL 54, LDL elevated at 147.  Given her type 2 diabetes mellitus, she needs more optimal control.  She is reportedly statin intolerant but is now taking Livalo 1 mg every evening and has been doing so for the past 6 months.  I recommended to her that she try taking 2 mg every evening.  As stated above, we had a very lengthy discussion about dietary modification.  3.  Hypertension: Currently on losartan 25 mg daily.  Blood pressure is mildly elevated.  This will need continued monitoring to see if further antihypertensive titration is indicated.  4.  Type 2 diabetes mellitus: Currently on glipizide, sitagliptin, and metformin.  A1c elevated at 8% on 03/09/2018.   Disposition: Follow up as needed   Signed: Kate Sable, M.D., F.A.C.C.  03/26/2018, 2:35 PM

## 2018-03-26 NOTE — Patient Instructions (Addendum)
Medication Instructions:  Your physician recommends that you continue on your current medications as directed. Please refer to the Current Medication list given to you today.   Labwork: NONE   Testing/Procedures: NONE   Follow-Up: Your physician recommends that you schedule a follow-up appointment as needed.    Any Other Special Instructions Will Be Listed Below (If Applicable).     If you need a refill on your cardiac medications before your next appointment, please call your pharmacy.  Thank you for choosing [facility]!   

## 2018-03-27 ENCOUNTER — Encounter (INDEPENDENT_AMBULATORY_CARE_PROVIDER_SITE_OTHER): Payer: Self-pay

## 2018-03-27 ENCOUNTER — Encounter: Payer: Self-pay | Admitting: Family Medicine

## 2018-03-29 ENCOUNTER — Other Ambulatory Visit: Payer: Self-pay | Admitting: Family Medicine

## 2018-03-29 DIAGNOSIS — I10 Essential (primary) hypertension: Secondary | ICD-10-CM

## 2018-03-29 MED FILL — VIT D2 1.25 MG (50,000 UNIT: 1.25 MG | 84 days supply | Qty: 12 | Fill #1

## 2018-03-29 MED FILL — LOSARTAN POTASSIUM 25 MG TA: 25 | 90 days supply | Qty: 90 | Fill #0

## 2018-03-29 MED FILL — SHIPPING COST: 1 days supply | Qty: 1 | Fill #5

## 2018-03-29 MED FILL — CITALOPRAM HBR 20 MG TABLET: 20 | 90 days supply | Qty: 90 | Fill #2

## 2018-04-04 ENCOUNTER — Encounter: Payer: Self-pay | Admitting: Family Medicine

## 2018-04-04 ENCOUNTER — Ambulatory Visit: Payer: 59 | Admitting: Registered"

## 2018-04-05 MED FILL — SHIPPING COST: 1 days supply | Qty: 1 | Fill #6

## 2018-04-05 MED FILL — JANUMET XR 50-1,000 MG TAB: 50-1000 | 30 days supply | Qty: 60 | Fill #7

## 2018-04-09 ENCOUNTER — Ambulatory Visit (INDEPENDENT_AMBULATORY_CARE_PROVIDER_SITE_OTHER): Payer: 59 | Admitting: Women's Health

## 2018-04-09 ENCOUNTER — Encounter: Payer: Self-pay | Admitting: Women's Health

## 2018-04-09 ENCOUNTER — Other Ambulatory Visit: Payer: Self-pay

## 2018-04-09 VITALS — BP 152/84 | HR 87 | Ht 61.5 in | Wt 195.0 lb

## 2018-04-09 DIAGNOSIS — R0602 Shortness of breath: Secondary | ICD-10-CM

## 2018-04-09 DIAGNOSIS — Z01411 Encounter for gynecological examination (general) (routine) with abnormal findings: Secondary | ICD-10-CM

## 2018-04-09 DIAGNOSIS — Z01419 Encounter for gynecological examination (general) (routine) without abnormal findings: Secondary | ICD-10-CM

## 2018-04-09 DIAGNOSIS — R635 Abnormal weight gain: Secondary | ICD-10-CM

## 2018-04-09 DIAGNOSIS — R140 Abdominal distension (gaseous): Secondary | ICD-10-CM | POA: Diagnosis not present

## 2018-04-09 NOTE — Progress Notes (Signed)
WELL-WOMAN EXAMINATION Patient name: Alicia Neal MRN [id number]  Date of birth: 01/25/1961 Chief Complaint:   Gynecologic Exam  History of Present Illness:   Alicia Neal is a 57 y.o. G7P2 Caucasian female being seen today for a routine well-woman exam.  Current complaints: increasing abdominal girth, weight gain, sob over past 6-9 months. Hasn't changed eating habits, feels 'different, like something growing inside'. Denies abd pain, changes in bowel/bladder habits, pelvic pain.  Had lung cancer screening CT chest in Nov which was normal.  Skin tag on buttocks, interested in having removed b/c she can't see it to monitor it, doesn't bother her.  Postmenopausal since 57yo, denies vaginal bleeding. Denies abnormal discharge, itching/odor/irritation.  H/O abnormal pap w/ colpo and cryosurgery w/ 5 subsequent normal paps.   PCP: Dr. Moshe Cipro, manages all chronic conditions including CHTN, DM, hyperlipidemia, dep/anx, she sees her q 54mths      does not desire labs, done w/ PCP No LMP recorded. Patient is postmenopausal. The current method of family planning is post menopausal status Last pap 09/25/17. Results were: neg w/ -HRHPV Last mammogram: 08/17/17. Results were: normal Last colonoscopy: 57yo. Results were: normal  Review of Systems:   Pertinent items are noted in HPI Denies any headaches, blurred vision, fatigue, shortness of breath, chest pain, abdominal pain, abnormal vaginal discharge/itching/odor/irritation, problems with periods, bowel movements, urination, or intercourse unless otherwise stated above. Pertinent History Reviewed:  Reviewed past medical,surgical, social and family history.  Reviewed problem list, medications and allergies. Physical Assessment:   Vitals:   04/09/18 1348  BP: (!) 152/84  Pulse: 87  Weight: 195 lb (88.5 kg)  Height: 5' 1.5" (1.562 m)  Body mass index is 36.25 kg/m.        Physical Examination:   General appearance - well appearing, and  in no distress  Mental status - alert, oriented to person, place, and time  Psych:  She has a normal mood and affect  Skin - warm and dry, normal color, no suspicious lesions noted  Chest - effort normal, all lung fields clear to auscultation bilaterally  Heart - normal rate and regular rhythm  Neck:  midline trachea, no thyromegaly or nodules  Breasts - breasts appear normal, no suspicious masses, no skin or nipple changes or  axillary nodes  Abdomen - appears distended, feels tight, nontender, no masses or organomegaly  Pelvic - VULVA: normal appearing vulva with no masses, tenderness or lesions  VAGINA: normal appearing vagina with normal color and discharge, no lesions  CERVIX: normal appearing cervix without discharge or lesions, no CMT  Thin prep pap is not done  UTERUS: uterus is felt to be normal size, shape, consistency and nontender   ADNEXA: No adnexal masses or tenderness noted.  Buttocks: large pedunculated skin tag Lt buttocks near perineum, co-exam w/ LHE, base too broad to tie off. Pt states it doesn't bother her, ok to leave alone and not remove per LHE  Extremities:  No swelling or varicosities noted  No results found for this or any previous visit (from the past 24 hour(s)).  Assessment & Plan:  1) Well-Woman Exam  2) CHTN, Type2DM, hyperlipidemia, dep/anx> all managed by PCP  3) Postmenopausal> reviewed to let us know if ever has vaginal bleeding  4) H/O abnormal pap> w/ multiple normal paps since  5) Abd distension, weight gain, accompanied by SOB> x 6-11mths, discussed w/ LHE, recommended abd/pelvic CT w/ contrast. Order placed, pt wants to check to see what insurance will  cover. Angie to pre-cert, then will schedule if pt wishes to proceed.   Labs/procedures today: exam  Mammogram in Oct, or sooner if problems Colonoscopy in March, or sooner if problems  Orders Placed This Encounter  Procedures  . CT ABDOMEN PELVIS W CONTRAST    Follow-up: Return in about 1  year (around 04/10/2019) for Physical.  [place], WHNP-BC 04/09/2018 1359

## 2018-04-12 ENCOUNTER — Other Ambulatory Visit: Payer: Self-pay | Admitting: Family Medicine

## 2018-04-12 DIAGNOSIS — E1169 Type 2 diabetes mellitus with other specified complication: Secondary | ICD-10-CM

## 2018-04-13 ENCOUNTER — Other Ambulatory Visit: Payer: Self-pay | Admitting: Women's Health

## 2018-04-13 DIAGNOSIS — R140 Abdominal distension (gaseous): Secondary | ICD-10-CM

## 2018-04-13 NOTE — Progress Notes (Signed)
Pt does wish to proceed w/ abd/pelvis CT, scheduled for 6/21 @ 0800 @ [facility], there is an earlier one available at [facility] 6/12 however she would not be able to make that one. NPO 4hr prior to CT, pick up prep day before from radiology, drink 1st 2hr prior to CT, then 2nd 1 hour prior to CT. Needs updated CMP, ordered today, can have done at [place] or [facility].  Roma Schanz, CNM, WHNP-BC 04/13/2018 1:51 PM

## 2018-04-14 ENCOUNTER — Telehealth: Payer: 59 | Admitting: Family Medicine

## 2018-04-14 DIAGNOSIS — R0989 Other specified symptoms and signs involving the circulatory and respiratory systems: Secondary | ICD-10-CM

## 2018-04-14 DIAGNOSIS — J029 Acute pharyngitis, unspecified: Secondary | ICD-10-CM | POA: Diagnosis not present

## 2018-04-14 MED ORDER — LEVOCETIRIZINE DIHYDROCHLORIDE 5 MG PO TABS: 5.0000 mg | ORAL_TABLET | Freq: Every evening | ORAL | 1 refills | Status: DC

## 2018-04-14 MED ORDER — IPRATROPIUM BROMIDE 0.03 % NA SOLN: 2.0000 | Freq: Two times a day (BID) | NASAL | 0 refills | Status: DC

## 2018-04-14 MED ORDER — LIDOCAINE VISCOUS HCL 2 % MT SOLN: 15.0000 mL | OROMUCOSAL | 0 refills | Status: DC | PRN

## 2018-04-14 NOTE — Progress Notes (Signed)
We are sorry that you are not feeling well.  Here is how we plan to help!  Based on what you have shared with me it looks like you have a condition called pharyngitis.  Pharyngitis is inflammation in the back of the throat which can cause a sore throat, scratchiness and sometimes difficulty swallowing.   Pharyngitis is typically caused by a respiratory virus and will just run its course.  Please keep in mind that your symptoms could last up to 10 days.  For throat pain, we recommend over the counter oral pain relief medications such as acetaminophen or aspirin, or anti-inflammatory medications such as ibuprofen or naproxen sodium.  Topical treatments such as oral throat lozenges or sprays may be used as needed.  Avoid close contact with loved ones, especially the very young and elderly.  Remember to wash your hands thoroughly throughout the day as this is the number one way tot prevent the spread of infection and wipe down door knobs and counters with disinfectant.  After careful review of your answers, I would not recommend and antibiotic for your condition.  Antibiotics should not be used to treat conditions that we suspect are caused by viruses like the virus that causes the common cold or flu.  Providers prescribe antibiotics to treat infections caused by bacteria. Antibiotics are very powerful in treating bacterial infections when they are used properly.  To maintain their effectiveness, they should be used only when necessary.  Overuse of antibiotics has resulted in the development of super bugs that are resistant to treatment!    I am prescribing symptomatic treatment for your concurrent sinus symptoms which are likely contributing to your worsening sore throat. Treatment prescribed as follows:      ipratropium (ATROVENT) 0.03 % nasal spray         Sig: Place 2 sprays into both nostrils 2 (two) times daily.         Dispense:  30 mL         Refill:  0     levocetirizine (XYZAL) 5 MG tablet    Sig: Take 1 tablet (5 mg total) by mouth every evening.         Dispense:  90 tablet         Refill:  1     lidocaine (XYLOCAINE) 2 % solution         Sig: Use as directed 15 mLs in the mouth or throat as needed (sore throat).         Dispense:  100 mL         Refill:  0   Home Care: Only take medications as instructed by your medical team. Do not drink alcohol while taking these medications. A steam or ultrasonic humidifier can help congestion.  You can place a towel over your head and breathe in the steam from hot water coming from a faucet. Avoid close contacts especially the very young and the elderly. Cover your mouth when you cough or sneeze. Always remember to wash your hands.  Get Help Right Away If: You develop worsening fever or throat pain. You develop a severe head ache or visual changes. Your symptoms persist after you have completed your treatment plan.  Make sure you Understand these instructions. Will watch your condition. Will get help right away if you are not doing well or get worse.  Your e-visit answers were reviewed by a board certified advanced clinical practitioner to complete your personal care plan.  Depending on  the condition, your plan could have included both over the counter or prescription medications.  If there is a problem please reply once you have received a response from your provider.  Your safety is important to [place].  If you have drug allergies check your prescription carefully.    You can use MyChart to ask questions about todays visit, request a non-urgent call back, or ask for a work or school excuse for 24 hours related to this e-Visit. If it has been greater than 24 hours you will need to follow up with your provider, or enter a new e-Visit to address those concerns.  You will get an e-mail in the next two days asking about your experience.  I hope that your e-visit has been valuable and will speed your recovery. Thank you for using  e-visits.  Carroll Sage. Kenton Kingfisher, MSN, FNP-C [facility]

## 2018-04-16 ENCOUNTER — Telehealth: Payer: Self-pay | Admitting: Family Medicine

## 2018-04-16 ENCOUNTER — Ambulatory Visit: Payer: 59 | Admitting: "Endocrinology

## 2018-04-17 ENCOUNTER — Ambulatory Visit (INDEPENDENT_AMBULATORY_CARE_PROVIDER_SITE_OTHER): Payer: 59 | Admitting: "Endocrinology

## 2018-04-17 ENCOUNTER — Encounter: Payer: Self-pay | Admitting: "Endocrinology

## 2018-04-17 VITALS — BP 140/83 | HR 82 | Ht 61.5 in | Wt 190.0 lb

## 2018-04-17 DIAGNOSIS — I10 Essential (primary) hypertension: Secondary | ICD-10-CM | POA: Diagnosis not present

## 2018-04-17 DIAGNOSIS — E782 Mixed hyperlipidemia: Secondary | ICD-10-CM

## 2018-04-17 DIAGNOSIS — E1165 Type 2 diabetes mellitus with hyperglycemia: Secondary | ICD-10-CM

## 2018-04-17 HISTORY — DX: Type 2 diabetes mellitus with hyperglycemia: E11.65

## 2018-04-17 MED ORDER — DULAGLUTIDE 1.5 MG/0.5ML ~~LOC~~ SOAJ: 1.5000 mg | SUBCUTANEOUS | 1 refills | Status: DC

## 2018-04-17 MED ORDER — METFORMIN HCL 1000 MG PO TABS: 1000.0000 mg | ORAL_TABLET | Freq: Two times a day (BID) | ORAL | 1 refills | Status: DC

## 2018-04-17 MED ORDER — PITAVASTATIN CALCIUM 2 MG PO TABS: 2.0000 mg | ORAL_TABLET | Freq: Every day | ORAL | 1 refills | Status: DC

## 2018-04-17 MED FILL — SHIPPING COST: 1 days supply | Qty: 1 | Fill #7

## 2018-04-17 MED FILL — metFORMIN HCL 1000 MG TABS: 1000 | 90 days supply | Qty: 180 | Fill #0

## 2018-04-17 MED FILL — TRULICITY 1.5 MG/0.5 ML PEN: 1.5 | 84 days supply | Qty: 6 | Fill #0

## 2018-04-17 MED FILL — LIVALO 2 MG TABLET: 2 | 90 days supply | Qty: 90 | Fill #0

## 2018-04-17 NOTE — Patient Instructions (Signed)

## 2018-04-17 NOTE — Progress Notes (Signed)
Endocrinology Consult Note       04/17/2018, 1:35 PM   Subjective:    Patient ID: Alicia Neal, female    DOB: 1961-04-01.  Alicia Neal is being seen in consultation for management of currently uncontrolled symptomatic diabetes requested by  Fayrene Helper, MD.   Past Medical History:  Diagnosis Date  . Adrenal adenoma    right side  . Allergic rhinitis   . Anxiety disorder   . Carpal tunnel syndrome of left wrist   . Chronic depression    Dr. Cheryln Manly  . GERD (gastroesophageal reflux disease)   . History of abnormal cervical Pap smear   . History of adenomatous polyp of colon   . History of hiatal hernia   . Hyperlipidemia   . Hypertension   . IBS (irritable bowel syndrome)   . OSA (obstructive sleep apnea)    per pt study yrs ago-- CPAP intolerant   . Pemphigoid, cicatricial    pruritic autoimmune blistering skin disorder  . PONV (postoperative nausea and vomiting)   . Pulmonary nodule, right pulmologist-  dr Nathaneil Canary mcquaid   x2    per CT 09-11-2015  . Trigger finger, left    ring finger  . Type 2 diabetes mellitus ([place])   . Wears glasses    Past Surgical History:  Procedure Laterality Date  . CARDIOVASCULAR STRESS TEST  04-30-2003   Small focus of decreased perfusion on rest images in the mid-distal anterior wall (worrisome for ischemia),  normal LV function and wall motion , ef 60%  . CARPAL TUNNEL RELEASE Left 11/26/2015   Procedure: CARPAL TUNNEL RELEASE;  Surgeon: Iran Planas, MD;  Location: [place];  Service: Orthopedics;  Laterality: Left;  . CHOLECYSTECTOMY  1989  . TRANSTHORACIC ECHOCARDIOGRAM  04-30-2003   mild hypokinetic in the inferior and posterior wall, ef 50%/  trivial MR /  mild TR  . TRIGGER FINGER RELEASE Left 11/26/2015   Procedure: RELEASE TRIGGER FINGER/A-1 PULLEY LEFT RING  FINGER;  Surgeon: Iran Planas, MD;  Location: [place];  Service: Orthopedics;   Laterality: Left;  . TUBAL LIGATION  2000   Social History   Socioeconomic History  . Marital status: Legally Separated    Spouse name: Not on file  . Number of children: 1  . Years of education: Not on file  . Highest education level: Not on file  Occupational History  . Not on file  Social Needs  . Financial resource strain: Not on file  . Food insecurity:    Worry: Not on file    Inability: Not on file  . Transportation needs:    Medical: Not on file    Non-medical: Not on file  Tobacco Use  . Smoking status: Former Smoker    Packs/day: 1.00    Years: 40.00    Pack years: 40.00    Types: Cigarettes    Last attempt to quit: 11/07/2013    Years since quitting: 4.4  . Smokeless tobacco: Never Used  Substance and Sexual Activity  . Alcohol use: Yes    Alcohol/week: 0.0 oz    Comment: occasional  .  Drug use: No  . Sexual activity: Not Currently    Birth control/protection: Post-menopausal  Lifestyle  . Physical activity:    Days per week: Not on file    Minutes per session: Not on file  . Stress: Not on file  Relationships  . Social connections:    Talks on phone: Not on file    Gets together: Not on file    Attends religious service: Not on file    Active member of club or organization: Not on file    Attends meetings of clubs or organizations: Not on file    Relationship status: Not on file  Other Topics Concern  . Not on file  Social History Narrative  . Not on file   Outpatient Encounter Medications as of 04/17/2018  Medication Sig  . busPIRone (BUSPAR) 5 MG tablet One tablet at bedtime , as needed  . citalopram (CELEXA) 20 MG tablet TAKE 1 TABLET BY MOUTH ONCE DAILY  . Dulaglutide (TRULICITY) 1.5 IO/2.7OJ SOPN Inject 1.5 mg into the skin once a week.  . ergocalciferol (VITAMIN D2) 50000 units capsule Take 1 capsule (50,000 Units total) by mouth once a week. One capsule once weekly  . fluticasone (FLONASE) 50 MCG/ACT nasal spray Place 2 sprays into both  nostrils daily. (Patient taking differently: Place 2 sprays into both nostrils daily as needed for allergies. )  . glucose blood (ACCU-CHEK GUIDE) test strip USE TO CHECK BLOOD SUGAR twice daily  (DX E11.9)  . Lancet Devices (ACCU-CHEK SOFTCLIX) lancets Use as instructed once daily dx e11.9  . losartan (COZAAR) 25 MG tablet TAKE 1 TABLET BY MOUTH DAILY.  . metFORMIN (GLUCOPHAGE) 1000 MG tablet Take 1 tablet (1,000 mg total) by mouth 2 (two) times daily with a meal.  . mycophenolate (CELLCEPT) 500 MG tablet Take 500 mg by mouth daily.   . pantoprazole (PROTONIX) 40 MG tablet Take 40 mg by mouth as needed.  . Pitavastatin Calcium (LIVALO) 2 MG TABS Take 1 tablet (2 mg total) by mouth at bedtime.  . [DISCONTINUED] glipiZIDE (GLUCOTROL XL) 5 MG 24 hr tablet Take 1 tablet (5 mg total) by mouth daily with breakfast. (Patient taking differently: Take 10 mg by mouth daily with breakfast. )  . [DISCONTINUED] ipratropium (ATROVENT) 0.03 % nasal spray Place 2 sprays into both nostrils 2 (two) times daily.  . [DISCONTINUED] levocetirizine (XYZAL) 5 MG tablet Take 1 tablet (5 mg total) by mouth every evening.  . [DISCONTINUED] lidocaine (XYLOCAINE) 2 % solution Use as directed 15 mLs in the mouth or throat as needed (sore throat).  . [DISCONTINUED] Pitavastatin Calcium (LIVALO) 1 MG TABS Take 1 tablet (1 mg total) by mouth at bedtime.  . [DISCONTINUED] SitaGLIPtin-MetFORMIN HCl 50-1000 MG TB24 Two tablets once daily   No facility-administered encounter medications on file as of 04/17/2018.     ALLERGIES: Allergies  Allergen Reactions  . Ezetimibe-Simvastatin Other (See Comments)    REACTION: muscle aches  . Pravachol [Pravastatin Sodium] Other (See Comments)    Joint pains and memory loss  . Simvastatin Other (See Comments)    REACTION: muscle aches  . Zetia [Ezetimibe] Other (See Comments)    Generalized joint pains    VACCINATION STATUS: Immunization History  Administered Date(s) Administered   . Influenza Split 07/19/2013, 07/22/2014  . Pneumococcal Conjugate-13 06/25/2014  . Tdap 06/25/2014    Diabetes  She presents for her initial diabetic visit. She has type 2 diabetes mellitus. Onset time: She was diagnosed at approximate age of  38 years. Her disease course has been fluctuating (She reports A1c of 13% at the time of diagnosis, with subsequent improvement to near target range and recent reverse to 8% in May 2019.). Hypoglycemia symptoms include hunger. Pertinent negatives for hypoglycemia include no confusion, headaches, pallor or seizures. Associated symptoms include fatigue, polydipsia and polyuria. Pertinent negatives for diabetes include no chest pain and no polyphagia. There are no hypoglycemic complications. Symptoms are worsening. There are no diabetic complications. Risk factors for coronary artery disease include diabetes mellitus, dyslipidemia, hypertension, obesity, tobacco exposure and post-menopausal. Current diabetic treatments: She is currently on glipizide 5 mg p.o. twice daily and Janumet 50/1000 mg p.o. twice daily. Her weight is fluctuating minimally. She is following a generally unhealthy diet. When asked about meal planning, she reported none. She has not had a previous visit with a dietitian. She participates in exercise intermittently. An ACE inhibitor/angiotensin II receptor blocker is being taken. Eye exam is current.  Hyperlipidemia  This is a chronic problem. The current episode started more than 1 year ago. The problem is uncontrolled. Recent lipid tests were reviewed and are high. Exacerbating diseases include diabetes. Pertinent negatives include no chest pain, myalgias or shortness of breath. Current antihyperlipidemic treatment includes statins. Risk factors for coronary artery disease include dyslipidemia, diabetes mellitus, hypertension and post-menopausal.  Hypertension  This is a chronic problem. The current episode started more than 1 year ago. Pertinent  negatives include no chest pain, headaches, palpitations or shortness of breath. Risk factors for coronary artery disease include dyslipidemia, diabetes mellitus and smoking/tobacco exposure. Past treatments include angiotensin blockers.      Review of Systems  Constitutional: Positive for fatigue. Negative for chills, fever and unexpected weight change.  HENT: Negative for trouble swallowing and voice change.   Eyes: Negative for visual disturbance.  Respiratory: Negative for cough, shortness of breath and wheezing.   Cardiovascular: Negative for chest pain, palpitations and leg swelling.  Gastrointestinal: Negative for diarrhea, nausea and vomiting.  Endocrine: Positive for polydipsia and polyuria. Negative for cold intolerance, heat intolerance and polyphagia.  Musculoskeletal: Negative for arthralgias and myalgias.  Skin: Negative for color change, pallor, rash and wound.  Neurological: Negative for seizures and headaches.  Psychiatric/Behavioral: Negative for confusion and suicidal ideas.    Objective:    BP 140/83   Pulse 82   Ht 5' 1.5" (1.562 m)   Wt 190 lb (86.2 kg)   BMI 35.32 kg/m   Wt Readings from Last 3 Encounters:  04/17/18 190 lb (86.2 kg)  04/09/18 195 lb (88.5 kg)  03/26/18 194 lb 3.2 oz (88.1 kg)     Physical Exam  Constitutional: She is oriented to person, place, and time. She appears well-developed.  HENT:  Head: Normocephalic and atraumatic.  Eyes: EOM are normal.  Neck: Normal range of motion. Neck supple. No tracheal deviation present. No thyromegaly present.  Cardiovascular: Normal rate and regular rhythm.  Pulmonary/Chest: Effort normal and breath sounds normal.  Abdominal: Soft. Bowel sounds are normal. There is no tenderness. There is no guarding.  Musculoskeletal: Normal range of motion. She exhibits no edema.  Neurological: She is alert and oriented to person, place, and time. She has normal reflexes. No cranial nerve deficit. Coordination  normal.  Skin: Skin is warm and dry. No rash noted. No erythema. No pallor.  Psychiatric: She has a normal mood and affect. Judgment normal.      CMP ( most recent) CMP     Component Value Date/Time   NA  135 03/09/2018 0849   K 4.3 03/09/2018 0849   CL 100 (L) 03/09/2018 0849   CO2 22 03/09/2018 0849   GLUCOSE 191 (H) 03/09/2018 0849   BUN 15 03/09/2018 0849   CREATININE 0.64 03/09/2018 0849   CREATININE 0.84 11/18/2016 0844   CALCIUM 9.1 03/09/2018 0849   PROT 7.3 03/09/2018 0849   ALBUMIN 4.4 03/09/2018 0849   AST 41 03/09/2018 0849   ALT 56 (H) 03/09/2018 0849   ALKPHOS 61 03/09/2018 0849   BILITOT 0.8 03/09/2018 0849   GFRNONAA >60 03/09/2018 0849   GFRNONAA 77 11/18/2016 0844   GFRAA >60 03/09/2018 0849   GFRAA 89 11/18/2016 0844     Diabetic Labs (most recent): Lab Results  Component Value Date   HGBA1C 8.0 (H) 03/09/2018   HGBA1C 7.2 (H) 12/04/2017   HGBA1C 6.6 (H) 09/04/2017     Lipid Panel ( most recent) Lipid Panel     Component Value Date/Time   CHOL 229 (H) 03/09/2018 0850   TRIG 140 03/09/2018 0850   HDL 54 03/09/2018 0850   CHOLHDL 4.2 03/09/2018 0850   VLDL 28 03/09/2018 0850   LDLCALC 147 (H) 03/09/2018 0850      Lab Results  Component Value Date   TSH 1.724 06/08/2017   TSH 1.620 06/08/2015   TSH 1.739 06/23/2014   TSH 1.659 04/15/2013   TSH 1.634 01/11/2012   TSH 1.344 11/04/2010   TSH 1.349 10/16/2009   TSH 2.028 01/25/2008   TSH 1.863 08/23/2006      Assessment & Plan:   1. Uncontrolled type 2 diabetes mellitus with hyperglycemia ([place])  - Alicia Neal has currently uncontrolled symptomatic type 2 DM since 57 years of age with A1c of 13%,  with most recent A1c of 8 %. Recent labs reviewed.  -She does not report any gross complications from her diabetes, howeverMonique H Neal remains at a high risk for acute and chronic complications of type 2 diabetes which include CAD, CVA, CKD, retinopathy, and neuropathy. These are  all discussed in l with the patient.  - I have counseled her on diet management and weight loss, by adopting a carbohydrate restricted/protein rich diet.  - Suggestion is made for her to avoid simple carbohydrates  from her diet including Cakes, Sweet Desserts, Ice Cream, Soda (diet and regular), Sweet Tea, Candies, Chips, Cookies, Store Bought Juices, Alcohol in Excess of  1-2 drinks a day, Artificial Sweeteners, and "Sugar-free" Products. This will help patient to have stable blood glucose profile and potentially avoid unintended weight gain.  - I encouraged her to switch to  unprocessed or minimally processed complex starch and increased protein intake (animal or plant source), fruits, and vegetables.  - she is advised to stick to a routine mealtimes to eat 3 meals  a day and avoid unnecessary snacks ( to snack only to correct hypoglycemia).   - she is scheduled  with Alicia Neal, Alicia Neal, Alicia Neal for individualized diabetes education.  - I have approached her with the following individualized plan to manage diabetes and patient agrees:  -She is not too far from target as far as her diabetes control is concerned, A1c of 8%.  She has several  choices to achieve control of diabetes to target. -She will benefit from medications which would help her lose weight or avoid weight gain.  -Accordingly, I discussed and discontinued her glipizide. -I discussed and discontinued Janumet to make room for a GLP-1 receptor agonists.   -I discussed and initiated metformin  1000 mg p.o. twice daily after breakfast and supper-therapeutically suitable for the patient.   -I discussed and initiated Trulicity 4.49 mg weekly for next 2 weeks, and 1.5 mg weekly subsequently.    - Patient specific target  A1c;  LDL, HDL, Triglycerides, and  Waist Circumference were discussed in detail.  2) BP/HTN: Her blood pressure is controlled to target.  I advised her to continue her current medications including losartan 25 mg p.o.  daily.   3) Lipids/HPL:   Her recent lipid panel showed uncontrolled LDL of 147.  Her records show that she has intolerance to the usual statins, however tolerated low-dose Livalo.  I advised her to increase Livalo to 2 mg p.o. nightly to advance if tolerated.  4)  Weight/Diet: Alicia Neal Consult will be initiated , exercise, and detailed carbohydrates information provided.  5) Chronic Care/Health Maintenance:  -she  is on ACEI/ARB and Statin medications and  is encouraged to continue to follow up with Ophthalmology, Dentist,  Podiatrist at least yearly or according to recommendations, and advised to  stay away from smoking. I have recommended yearly flu vaccine and pneumonia vaccination at least every 5 years; moderate intensity exercise for up to 150 minutes weekly; and  sleep for at least 7 hours a day.  - I advised patient to maintain close follow up with Fayrene Helper, MD for primary care needs.  - Time spent with the patient: 45 minutes, of which >50% was spent in obtaining information about her symptoms, reviewing her previous labs, evaluations, and treatments, counseling her about her currently uncontrolled type 2 diabetes, hyperlipidemia, hypertension, and developing a plan  for long term treatment as necessary.  Alicia Neal participated in the discussions, expressed understanding, and voiced agreement with the above plans.  All questions were answered to her satisfaction. she is encouraged to contact clinic should she have any questions or concerns prior to her return visit.  Follow up plan: - Return in about 2 months (around 06/19/2018) for follow up with pre-visit labs.  Glade Lloyd, MD [facility] [address] [place], [place] [address] Phone: [phone]  Fax: [phone]    04/17/2018, 1:35 PM  This note was partially dictated with voice recognition software. Similar sounding words can be transcribed inadequately or  may not  be corrected upon review.

## 2018-04-18 ENCOUNTER — Telehealth: Payer: Self-pay | Admitting: Family Medicine

## 2018-04-18 ENCOUNTER — Other Ambulatory Visit (HOSPITAL_COMMUNITY): Payer: 59

## 2018-04-18 NOTE — Telephone Encounter (Signed)
Tomorrow there is a 10am same day

## 2018-04-18 NOTE — Telephone Encounter (Signed)
Patient left voicemail that she is sick w/ a throat issue since Friday. She has already completed an Evisit, but she is no better.  Requesting an appt today/tomorrow. Cb#: [phone]

## 2018-04-19 NOTE — Telephone Encounter (Signed)
LEFT VOICEMAIL TO CALL BACK & DISCUSS.

## 2018-04-27 ENCOUNTER — Ambulatory Visit (HOSPITAL_COMMUNITY): Payer: 59

## 2018-05-07 ENCOUNTER — Ambulatory Visit (HOSPITAL_COMMUNITY): Payer: 59

## 2018-05-29 ENCOUNTER — Encounter: Payer: 59 | Attending: Family Medicine | Admitting: Nutrition

## 2018-05-29 ENCOUNTER — Encounter: Payer: Self-pay | Admitting: Nutrition

## 2018-05-29 VITALS — Ht 61.5 in | Wt 187.0 lb

## 2018-05-29 DIAGNOSIS — E1165 Type 2 diabetes mellitus with hyperglycemia: Secondary | ICD-10-CM

## 2018-05-29 DIAGNOSIS — IMO0002 Reserved for concepts with insufficient information to code with codable children: Secondary | ICD-10-CM

## 2018-05-29 DIAGNOSIS — E118 Type 2 diabetes mellitus with unspecified complications: Secondary | ICD-10-CM

## 2018-05-29 DIAGNOSIS — E669 Obesity, unspecified: Secondary | ICD-10-CM

## 2018-05-29 NOTE — Patient Instructions (Signed)
Goals 1 Follow My Plate 2. Don't eat past 7 pm 3. Increase fresh fruits and low carb vegetables. 4. Keep exercising Get A1C down to 7%

## 2018-05-29 NOTE — Progress Notes (Signed)
  Medical Nutrition Therapy:  Appt start time: 0800 end time:  0900.   Assessment:  Primary concerns today: Diabetes Type 2. Has cut out sweets. Struggles with making good decisions about food in social situations. Admits to a higher fat, higher calorie diet on weekends.Walks daily. Use to crave sweets on Glipizide along with extreme hunger and weight gain.. Testing in am BS 150's..    And recently started in Trulicity after stopping Glipizide. Seeing Dr. Dorris Fetch 5 weeks ago. While on Glipizide was hungry all the time.  Has cut out snacks. Eating three meals per day. Stuggles with weekends. Eats Dill pickles and sf dessert at night. Snacks at night. Drinking water and coffee and unsweet tea. Not using artificai sweetners. 150's in am. Lost 8 lbs since visit with Dr.  Dorris Fetch. Sees Dr. Dorris Fetch, Endocrinology. Walks daily 45 minutes and enjoys it. Engaged to make changes with eating habits, portions and better carb management.   Lab Results  Component Value Date   HGBA1C 8.0 (H) 03/09/2018   . Trulicity, Metformin  Preferred Learning Style:    No preference indicated   Learning Readiness:    Ready  Change in progress   MEDICATIONS: see list   DIETARY INTAKE:  24-hr recall:  B ( AM): Tomato sandwich and bacon 2 slice and dukes mayo, water,  2 cups black coffee Snk ( AM): water L ( PM):Ham and cheese sandwich and potato salad and unsweet tea Snk ( PM): none D ( PM): baked chicken, salad with caesar dressing,  Water, Snk ( PM): 2 dill pickles, sugar free whip cream, sf coconut and pecans and dark chocolate,1/2 c Beverages: water, unsweet tea,   Usual physical activity: Walks 45 minutes daily.  Estimated energy needs: 1200-1500  calories 135 g carbohydrates 90 g protein 33 g fat  Progress Towards Goal(s):  In progress.   Nutritional Diagnosis:  NB-1.1 Food and nutrition-related knowledge deficit As related to Diabetes Type 2..  As evidenced by A1C 8%..    Intervention:   Nutrition and Diabetes education provided on My Plate, CHO counting, meal planning, portion sizes, timing of meals, avoiding snacks between meals unless having a low blood sugar, target ranges for A1C and blood sugars, signs/symptoms and treatment of hyper/hypoglycemia, monitoring blood sugars, taking medications as prescribed, benefits of exercising 30 minutes per day and prevention of complications of DM. Marland Kitchen Goals 1 Follow My Plate 2. Don't eat past 7 pm 3. Increase fresh fruits and low carb vegetables. 4. Keep exercising Get A1C down to 7%  Teaching Method Utilized:  Visual Auditory Hands on  Handouts given during visit include:  The Plate Method  Meal Plan Card   Barriers to learning/adherence to lifestyle change: none  Demonstrated degree of understanding via:  Teach Back   Monitoring/Evaluation:  Dietary intake, exercise, meal planning, and body weight in 1-3 month(s).

## 2018-06-03 MED FILL — MYCOPHENOLATE 500 MG TABLET: 500 | 90 days supply | Qty: 90 | Fill #1

## 2018-06-04 ENCOUNTER — Other Ambulatory Visit: Payer: Self-pay | Admitting: Family Medicine

## 2018-06-04 MED FILL — SHIPPING COST: 1 days supply | Qty: 1 | Fill #8

## 2018-06-04 MED FILL — busPIRone HCL 5 MG TABS: 5 | 30 days supply | Qty: 90 | Fill #0

## 2018-06-08 ENCOUNTER — Other Ambulatory Visit (HOSPITAL_COMMUNITY)
Admission: RE | Admit: 2018-06-08 | Discharge: 2018-06-08 | Disposition: A | Discharge status: Home / Self Care | Payer: 59 | Source: Ambulatory Visit | Attending: "Endocrinology | Admitting: "Endocrinology

## 2018-06-08 ENCOUNTER — Other Ambulatory Visit (HOSPITAL_COMMUNITY)
Admission: RE | Admit: 2018-06-08 | Discharge: 2018-06-08 | Disposition: A | Discharge status: Home / Self Care | Payer: 59 | Source: Ambulatory Visit | Attending: Family Medicine | Admitting: Family Medicine

## 2018-06-08 DIAGNOSIS — E1165 Type 2 diabetes mellitus with hyperglycemia: Secondary | ICD-10-CM | POA: Insufficient documentation

## 2018-06-08 DIAGNOSIS — E782 Mixed hyperlipidemia: Secondary | ICD-10-CM | POA: Insufficient documentation

## 2018-06-08 DIAGNOSIS — E785 Hyperlipidemia, unspecified: Secondary | ICD-10-CM | POA: Insufficient documentation

## 2018-06-08 DIAGNOSIS — E119 Type 2 diabetes mellitus without complications: Secondary | ICD-10-CM | POA: Insufficient documentation

## 2018-06-08 LAB — HEMOGLOBIN A1C
HEMOGLOBIN A1C: 7.4 % — AB (ref 4.8–5.6)
Mean Plasma Glucose: 165.68 mg/dL

## 2018-06-08 LAB — COMPREHENSIVE METABOLIC PANEL
ALBUMIN: 4.5 g/dL (ref 3.5–5.0)
ALT: 34 U/L (ref 0–44)
AST: 26 U/L (ref 15–41)
Alkaline Phosphatase: 52 U/L (ref 38–126)
Anion gap: 8 (ref 5–15)
BILIRUBIN TOTAL: 0.9 mg/dL (ref 0.3–1.2)
BUN: 14 mg/dL (ref 6–20)
CHLORIDE: 101 mmol/L (ref 98–111)
CO2: 27 mmol/L (ref 22–32)
CREATININE: 0.71 mg/dL (ref 0.44–1.00)
Calcium: 9 mg/dL (ref 8.9–10.3)
GFR calc Af Amer: >60 mL/min (ref >60)
Glucose, Bld: 131 mg/dL — ABNORMAL HIGH (ref 70–99)
Potassium: 4.1 mmol/L (ref 3.5–5.1)
Sodium: 136 mmol/L (ref 135–145)
TOTAL PROTEIN: 7.5 g/dL (ref 6.5–8.1)

## 2018-06-08 LAB — LIPID PANEL
CHOLESTEROL: 167 mg/dL (ref 0–200)
HDL: 47 mg/dL (ref >40)
LDL Cholesterol: 96 mg/dL (ref 0–99)
TRIGLYCERIDES: 122 mg/dL (ref <150)
Total CHOL/HDL Ratio: 3.6 RATIO
VLDL: 24 mg/dL (ref 0–40)

## 2018-06-08 LAB — T4, FREE: Free T4: 0.92 ng/dL (ref 0.82–1.77)

## 2018-06-08 LAB — TSH: TSH: 1.601 u[IU]/mL (ref 0.350–4.500)

## 2018-06-09 LAB — VITAMIN D 25 HYDROXY (VIT D DEFICIENCY, FRACTURES): VIT D 25 HYDROXY: 49 ng/mL (ref 30.0–100.0)

## 2018-06-09 LAB — MICROALBUMIN / CREATININE URINE RATIO
CREATININE, UR: 39.2 mg/dL
Microalb Creat Ratio: 19.9 mg/g creat (ref 0.0–30.0)
Microalb, Ur: 7.8 ug/mL — ABNORMAL HIGH

## 2018-06-12 ENCOUNTER — Ambulatory Visit (INDEPENDENT_AMBULATORY_CARE_PROVIDER_SITE_OTHER): Payer: 59 | Admitting: Family Medicine

## 2018-06-12 ENCOUNTER — Encounter: Payer: Self-pay | Admitting: Family Medicine

## 2018-06-12 VITALS — BP 122/78 | HR 78 | Resp 16 | Ht 62.0 in | Wt 182.0 lb

## 2018-06-12 DIAGNOSIS — I10 Essential (primary) hypertension: Secondary | ICD-10-CM | POA: Diagnosis not present

## 2018-06-12 DIAGNOSIS — L121 Cicatricial pemphigoid: Secondary | ICD-10-CM | POA: Diagnosis not present

## 2018-06-12 DIAGNOSIS — F411 Generalized anxiety disorder: Secondary | ICD-10-CM

## 2018-06-12 DIAGNOSIS — E782 Mixed hyperlipidemia: Secondary | ICD-10-CM | POA: Diagnosis not present

## 2018-06-12 DIAGNOSIS — K219 Gastro-esophageal reflux disease without esophagitis: Secondary | ICD-10-CM

## 2018-06-12 DIAGNOSIS — Z23 Encounter for immunization: Secondary | ICD-10-CM | POA: Diagnosis not present

## 2018-06-12 DIAGNOSIS — E119 Type 2 diabetes mellitus without complications: Secondary | ICD-10-CM | POA: Diagnosis not present

## 2018-06-12 MED ORDER — PANTOPRAZOLE SODIUM 40 MG PO TBEC: 40.0000 mg | DELAYED_RELEASE_TABLET | Freq: Every day | ORAL | 3 refills | Status: DC

## 2018-06-12 MED FILL — PANTOPRAZOLE SOD DR 40 MG T: 40 | 90 days supply | Qty: 90 | Fill #0

## 2018-06-12 MED FILL — SHIPPING COST: 1 days supply | Qty: 1 | Fill #9

## 2018-06-12 NOTE — Patient Instructions (Signed)
F/U in 4 months, call if you need me before  Shingrix #1 today  12 to 15 pound weight loss goal  CONGRATS on marked improvement in your health    Try to wean off of buspar at bedtime as we discussed this has been discontinued from your med list  It is important that you exercise regularly at least 60 minutes 5 times a week. If you develop chest pain, have severe difficulty breathing, or feel very tired, stop exercising immediately and seek medical attention   Thank you  for choosing  [facility]. We consider it a privelige to serve you.  Delivering excellent health care in a caring and  compassionate way is our goal.  Partnering with you,  so that together we can achieve this goal is our strategy.

## 2018-06-13 ENCOUNTER — Other Ambulatory Visit: Payer: Self-pay | Admitting: Family Medicine

## 2018-06-14 MED FILL — ACCU-CHEK FASTCLIX LANCETS: 90 days supply | Qty: 102 | Fill #0

## 2018-06-17 ENCOUNTER — Encounter: Payer: Self-pay | Admitting: Family Medicine

## 2018-06-17 NOTE — Assessment & Plan Note (Signed)
Improved , continue medication and low fat diet Hyperlipidemia:Low fat diet discussed and encouraged.   Lipid Panel  Lab Results  Component Value Date   CHOL 167 06/08/2018   HDL 47 06/08/2018   LDLCALC 96 06/08/2018   TRIG 122 06/08/2018   CHOLHDL 3.6 06/08/2018

## 2018-06-17 NOTE — Assessment & Plan Note (Signed)
Controlled, no change in medication  

## 2018-06-17 NOTE — Assessment & Plan Note (Signed)
Improved will try weaning off of buspar

## 2018-06-17 NOTE — Progress Notes (Signed)
Alicia Neal     MRN: [id number]      DOB: 1961/07/14   HPI Alicia Neal is here for follow up and re-evaluation of chronic medical conditions, medication management and review of any available recent lab and radiology data.  Preventive health is updated, specifically  Cancer screening and Immunization.   Questions or concerns regarding consultations or procedures which the PT has had in the interim are  addressed. The PT denies any adverse reactions to current medications since the last visit.  There are no new concerns.  There are no specific complaints  FEELS MUCH BETTER WITH WEIGHT LOSS ON NEW DIABETIC MEDICATION, INITIALLY HAD A LOT OF NAUSEA BUT THIS HAS CONSIDERABLY LESSENED ELECTED AGAINST ABDOMINAL SCAN WAS AFRAID OF THE CONTRAST Denies polyuria, polydipsia, blurred vision , or hypoglycemic episodes.  ROS Denies recent fever or chills. Denies sinus pressure, nasal congestion, ear pain or sore throat. Denies chest congestion, productive cough or wheezing. Denies chest pains, palpitations and leg swelling Denies abdominal pain, nausea, vomiting,diarrhea or constipation.   Denies dysuria, frequency, hesitancy or incontinence. Denies joint pain, swelling and limitation in mobility. Denies headaches, seizures, numbness, or tingling. Denies depression, anxiety or insomnia. Denies skin break down or rash.   PE  BP 122/78   Pulse 78   Resp 16   Ht 5\' 2"  (1.575 m)   Wt 182 lb (82.6 kg)   SpO2 98%   BMI 33.29 kg/m   Patient alert and oriented and in no cardiopulmonary distress.  HEENT: No facial asymmetry, EOMI,   oropharynx pink and moist.  Neck supple no JVD, no mass.  Chest: Clear to auscultation bilaterally.  CVS: S1, S2 no murmurs, no S3.Regular rate.  ABD: Soft non tender.   Ext: No edema  MS: Adequate ROM spine, shoulders, hips and knees.  Skin: Intact, no ulcerations or rash noted.  Psych: Good eye contact, normal affect. Memory intact not anxious or  depressed appearing.  CNS: CN 2-12 intact, power,  normal throughout.no focal deficits noted.   Assessment & Plan  Morbid obesity ([place]) Marked improvement, pt to consider weight watchers Patient re-educated about  the importance of commitment to a  minimum of 150 minutes of exercise per week.  The importance of healthy food choices with portion control discussed. Encouraged to start a food diary, count calories and to consider  joining a support group. Sample diet sheets offered. Goals set by the patient for the next several months.   Weight /BMI 06/12/2018 05/29/2018 04/17/2018  WEIGHT 182 lb 187 lb 190 lb  HEIGHT 5\' 2"  5' 1.5" 5' 1.5"  BMI 33.29 kg/m2 34.76 kg/m2 35.32 kg/m2      Type 2 diabetes mellitus with hemoglobin A1c goal of less than 7.0% (HCC) Improved greatly under care of endo Alicia Neal is reminded of the importance of commitment to daily physical activity for 30 minutes or more, as able and the need to limit carbohydrate intake to 30 to 60 grams per meal to help with blood sugar control.   The need to take medication as prescribed, test blood sugar as directed, and to call between visits if there is a concern that blood sugar is uncontrolled is also discussed.   Alicia Neal is reminded of the importance of daily foot exam, annual eye examination, and good blood sugar, blood pressure and cholesterol control.  Diabetic Labs Latest Ref Rng & Units 06/08/2018 03/09/2018 12/04/2017 09/06/2017 09/04/2017  HbA1c 4.8 - 5.6 % 7.4(H) 8.0(H) 7.2(H) - 6.6(H)  Microalbumin Not Estab. ug/mL 7.8(H) - - <3.0(H) -  Micro/Creat Ratio 0.0 - 30.0 mg/g creat 19.9 - - <10.4 -  Chol 0 - 200 mg/dL 167 229(H) 225(H) - 210(H)  HDL >40 mg/dL 47 54 56 - 54  Calc LDL 0 - 99 mg/dL 96 147(H) 122(H) - 115(H)  Triglycerides <150 mg/dL 122 140 236(H) - 204(H)  Creatinine 0.44 - 1.00 mg/dL 0.71 0.64 0.73 - 0.61   BP/Weight 06/12/2018 05/29/2018 04/17/2018 04/09/2018 03/26/2018 03/15/2018 5/63/8937  Systolic BP  342 - 876 811 572 620 355  Diastolic BP 78 - 83 84 82 80 86  Wt. (Lbs) 182 187 190 195 194.2 193 190.12  BMI 33.29 34.76 35.32 36.25 36.1 35.88 34.77   Foot/eye exam completion dates Latest Ref Rng & Units 03/15/2018 10/22/2015  Eye Exam No Retinopathy - -  Foot Form Completion - Done Done        GAD (generalized anxiety disorder) Improved will try weaning off of buspar  Essential hypertension, benign Controlled, no change in medication DASH diet and commitment to daily physical activity for a minimum of 30 minutes discussed and encouraged, as a part of hypertension management. The importance of attaining a healthy weight is also discussed.  BP/Weight 06/12/2018 05/29/2018 04/17/2018 04/09/2018 03/26/2018 03/15/2018 9/74/1638  Systolic BP 453 - 646 803 212 248 250  Diastolic BP 78 - 83 84 82 80 86  Wt. (Lbs) 182 187 190 195 194.2 193 190.12  BMI 33.29 34.76 35.32 36.25 36.1 35.88 34.77       Mixed hyperlipidemia Improved , continue medication and low fat diet Hyperlipidemia:Low fat diet discussed and encouraged.   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

## 2018-06-17 NOTE — Assessment & Plan Note (Signed)
Controlled, no change in medication DASH diet and commitment to daily physical activity for a minimum of 30 minutes discussed and encouraged, as a part of hypertension management. The importance of attaining a healthy weight is also discussed.  BP/Weight 06/12/2018 05/29/2018 04/17/2018 04/09/2018 03/26/2018 03/15/2018 3/64/3837  Systolic BP 793 - 968 864 847 207 218  Diastolic BP 78 - 83 84 82 80 86  Wt. (Lbs) 182 187 190 195 194.2 193 190.12  BMI 33.29 34.76 35.32 36.25 36.1 35.88 34.77

## 2018-06-17 NOTE — Assessment & Plan Note (Signed)
Stable and controlled followed by dermatology

## 2018-06-17 NOTE — Assessment & Plan Note (Signed)
Improved greatly under care of endo Ms. Burdine is reminded of the importance of commitment to daily physical activity for 30 minutes or more, as able and the need to limit carbohydrate intake to 30 to 60 grams per meal to help with blood sugar control.   The need to take medication as prescribed, test blood sugar as directed, and to call between visits if there is a concern that blood sugar is uncontrolled is also discussed.   Ms. Gallop is reminded of the importance of daily foot exam, annual eye examination, and good blood sugar, blood pressure and cholesterol control.  Diabetic Labs Latest Ref Rng & Units 06/08/2018 03/09/2018 12/04/2017 09/06/2017 09/04/2017  HbA1c 4.8 - 5.6 % 7.4(H) 8.0(H) 7.2(H) - 6.6(H)  Microalbumin Not Estab. ug/mL 7.8(H) - - <3.0(H) -  Micro/Creat Ratio 0.0 - 30.0 mg/g creat 19.9 - - <10.4 -  Chol 0 - 200 mg/dL 167 229(H) 225(H) - 210(H)  HDL >40 mg/dL 47 54 56 - 54  Calc LDL 0 - 99 mg/dL 96 147(H) 122(H) - 115(H)  Triglycerides <150 mg/dL 122 140 236(H) - 204(H)  Creatinine 0.44 - 1.00 mg/dL 0.71 0.64 0.73 - 0.61   BP/Weight 06/12/2018 05/29/2018 04/17/2018 04/09/2018 03/26/2018 03/15/2018 2/44/9753  Systolic BP 005 - 110 211 173 567 014  Diastolic BP 78 - 83 84 82 80 86  Wt. (Lbs) 182 187 190 195 194.2 193 190.12  BMI 33.29 34.76 35.32 36.25 36.1 35.88 34.77   Foot/eye exam completion dates Latest Ref Rng & Units 03/15/2018 10/22/2015  Eye Exam No Retinopathy - -  Foot Form Completion - Done Done

## 2018-06-17 NOTE — Assessment & Plan Note (Signed)
Marked improvement, pt to consider weight watchers Patient re-educated about  the importance of commitment to a  minimum of 150 minutes of exercise per week.  The importance of healthy food choices with portion control discussed. Encouraged to start a food diary, count calories and to consider  joining a support group. Sample diet sheets offered. Goals set by the patient for the next several months.   Weight /BMI 06/12/2018 05/29/2018 04/17/2018  WEIGHT 182 lb 187 lb 190 lb  HEIGHT 5\' 2"  5' 1.5" 5' 1.5"  BMI 33.29 kg/m2 34.76 kg/m2 35.32 kg/m2

## 2018-06-19 ENCOUNTER — Encounter: Payer: 59 | Attending: Family Medicine | Admitting: Nutrition

## 2018-06-19 ENCOUNTER — Encounter: Payer: Self-pay | Admitting: Nutrition

## 2018-06-19 ENCOUNTER — Encounter: Payer: Self-pay | Admitting: "Endocrinology

## 2018-06-19 ENCOUNTER — Ambulatory Visit (INDEPENDENT_AMBULATORY_CARE_PROVIDER_SITE_OTHER): Payer: 59 | Admitting: "Endocrinology

## 2018-06-19 VITALS — BP 131/87 | HR 70 | Ht 61.5 in | Wt 182.0 lb

## 2018-06-19 VITALS — Ht 61.5 in | Wt 182.0 lb

## 2018-06-19 DIAGNOSIS — E1165 Type 2 diabetes mellitus with hyperglycemia: Secondary | ICD-10-CM

## 2018-06-19 DIAGNOSIS — E118 Type 2 diabetes mellitus with unspecified complications: Secondary | ICD-10-CM

## 2018-06-19 DIAGNOSIS — I10 Essential (primary) hypertension: Secondary | ICD-10-CM

## 2018-06-19 DIAGNOSIS — E782 Mixed hyperlipidemia: Secondary | ICD-10-CM

## 2018-06-19 DIAGNOSIS — E669 Obesity, unspecified: Secondary | ICD-10-CM

## 2018-06-19 DIAGNOSIS — IMO0002 Reserved for concepts with insufficient information to code with codable children: Secondary | ICD-10-CM

## 2018-06-19 NOTE — Patient Instructions (Signed)
Goal 1. Start eating whole wheat bread 2 Keep exercising. 3. Lose 1 lb per week

## 2018-06-19 NOTE — Progress Notes (Signed)
Endocrinology follow-up  Note       06/19/2018, 11:35 AM   Subjective:    Patient ID: Alicia Neal, female    DOB: 1961/07/10.  Alicia Neal is being seen in follow-up for management of currently uncontrolled symptomatic diabetes requested by  Fayrene Helper, MD.   Past Medical History:  Diagnosis Date  . Adrenal adenoma    right side  . Allergic rhinitis   . Anxiety disorder   . Carpal tunnel syndrome of left wrist   . Chronic depression    Dr. Cheryln Manly  . GERD (gastroesophageal reflux disease)   . History of abnormal cervical Pap smear   . History of adenomatous polyp of colon   . History of hiatal hernia   . Hyperlipidemia   . Hypertension   . IBS (irritable bowel syndrome)   . OSA (obstructive sleep apnea)    per pt study yrs ago-- CPAP intolerant   . Pemphigoid, cicatricial    pruritic autoimmune blistering skin disorder  . PONV (postoperative nausea and vomiting)   . Pulmonary nodule, right pulmologist-  dr Nathaneil Canary mcquaid   x2    per CT 09-11-2015  . Trigger finger, left    ring finger  . Type 2 diabetes mellitus ([place])   . Wears glasses    Past Surgical History:  Procedure Laterality Date  . CARDIOVASCULAR STRESS TEST  04-30-2003   Small focus of decreased perfusion on rest images in the mid-distal anterior wall (worrisome for ischemia),  normal LV function and wall motion , ef 60%  . CARPAL TUNNEL RELEASE Left 11/26/2015   Procedure: CARPAL TUNNEL RELEASE;  Surgeon: Iran Planas, MD;  Location: [place];  Service: Orthopedics;  Laterality: Left;  . CHOLECYSTECTOMY  1989  . TRANSTHORACIC ECHOCARDIOGRAM  04-30-2003   mild hypokinetic in the inferior and posterior wall, ef 50%/  trivial MR /  mild TR  . TRIGGER FINGER RELEASE Left 11/26/2015   Procedure: RELEASE TRIGGER FINGER/A-1 PULLEY LEFT RING  FINGER;  Surgeon: Iran Planas, MD;  Location: [facility];  Service: Orthopedics;   Laterality: Left;  . TUBAL LIGATION  2000   Social History   Socioeconomic History  . Marital status: Single    Spouse name: Not on file  . Number of children: 1  . Years of education: Not on file  . Highest education level: Not on file  Occupational History  . Not on file  Social Needs  . Financial resource strain: Not on file  . Food insecurity:    Worry: Not on file    Inability: Not on file  . Transportation needs:    Medical: Not on file    Non-medical: Not on file  Tobacco Use  . Smoking status: Former Smoker    Packs/day: 1.00    Years: 40.00    Pack years: 40.00    Types: Cigarettes    Last attempt to quit: 11/07/2013    Years since quitting: 4.6  . Smokeless tobacco: Never Used  Substance and Sexual Activity  . Alcohol use: Yes    Alcohol/week: 0.0 standard drinks    Comment: occasional  .  Drug use: No  . Sexual activity: Not Currently    Birth control/protection: Post-menopausal  Lifestyle  . Physical activity:    Days per week: Not on file    Minutes per session: Not on file  . Stress: Not on file  Relationships  . Social connections:    Talks on phone: Not on file    Gets together: Not on file    Attends religious service: Not on file    Active member of club or organization: Not on file    Attends meetings of clubs or organizations: Not on file    Relationship status: Not on file  Other Topics Concern  . Not on file  Social History Narrative  . Not on file   Outpatient Encounter Medications as of 06/19/2018  Medication Sig  . ACCU-CHEK FASTCLIX LANCETS MISC USE TO CHECK BLOOD SUGAR ONCE A DAY (DX E11.9)  . citalopram (CELEXA) 20 MG tablet TAKE 1 TABLET BY MOUTH ONCE DAILY  . Dulaglutide (TRULICITY) 1.5 UK/0.2RK SOPN Inject 1.5 mg into the skin once a week.  . fluticasone (FLONASE) 50 MCG/ACT nasal spray Place 2 sprays into both nostrils daily. (Patient taking differently: Place 2 sprays into both nostrils daily as needed for allergies. )  .  glucose blood (ACCU-CHEK GUIDE) test strip USE TO CHECK BLOOD SUGAR twice daily  (DX E11.9)  . losartan (COZAAR) 25 MG tablet TAKE 1 TABLET BY MOUTH DAILY.  . metFORMIN (GLUCOPHAGE) 1000 MG tablet Take 1 tablet (1,000 mg total) by mouth 2 (two) times daily with a meal.  . mycophenolate (CELLCEPT) 500 MG tablet Take 500 mg by mouth daily.   . pantoprazole (PROTONIX) 40 MG tablet Take 1 tablet (40 mg total) by mouth daily.  . Pitavastatin Calcium (LIVALO) 2 MG TABS Take 1 tablet (2 mg total) by mouth at bedtime.  . [DISCONTINUED] ergocalciferol (VITAMIN D2) 50000 units capsule Take 1 capsule (50,000 Units total) by mouth once a week. One capsule once weekly  . [DISCONTINUED] pantoprazole (PROTONIX) 40 MG tablet Take 40 mg by mouth as needed.   No facility-administered encounter medications on file as of 06/19/2018.     ALLERGIES: Allergies  Allergen Reactions  . Ezetimibe-Simvastatin Other (See Comments)    REACTION: muscle aches  . Pravachol [Pravastatin Sodium] Other (See Comments)    Joint pains and memory loss  . Simvastatin Other (See Comments)    REACTION: muscle aches  . Zetia [Ezetimibe] Other (See Comments)    Generalized joint pains    VACCINATION STATUS: Immunization History  Administered Date(s) Administered  . Influenza Split 07/19/2013, 07/22/2014  . Pneumococcal Conjugate-13 06/25/2014  . Tdap 06/25/2014  . Zoster Recombinat (Shingrix) 06/12/2018    Diabetes  She presents for her follow-up diabetic visit. She has type 2 diabetes mellitus. Onset time: She was diagnosed at approximate age of 57 years. Her disease course has been improving (She reports A1c of 13% at the time of diagnosis, with subsequent improvement to near target range and recent reverse to 8% in May 2019.). Pertinent negatives for hypoglycemia include no confusion, headaches, hunger, pallor or seizures. Pertinent negatives for diabetes include no chest pain, no fatigue, no polydipsia, no polyphagia and  no polyuria. There are no hypoglycemic complications. Symptoms are improving. There are no diabetic complications. Risk factors for coronary artery disease include diabetes mellitus, dyslipidemia, hypertension, obesity, tobacco exposure and post-menopausal. Current diabetic treatments: She is currently on glipizide 5 mg p.o. twice daily and Janumet 50/1000 mg p.o. twice daily. Her  weight is fluctuating minimally. She is following a generally unhealthy diet. When asked about meal planning, she reported none. She has not had a previous visit with a dietitian. She participates in exercise intermittently. An ACE inhibitor/angiotensin II receptor blocker is being taken. Eye exam is current.  Hyperlipidemia  This is a chronic problem. The current episode started more than 1 year ago. The problem is uncontrolled. Recent lipid tests were reviewed and are high. Exacerbating diseases include diabetes. Pertinent negatives include no chest pain, myalgias or shortness of breath. Current antihyperlipidemic treatment includes statins. Risk factors for coronary artery disease include dyslipidemia, diabetes mellitus, hypertension and post-menopausal.  Hypertension  This is a chronic problem. The current episode started more than 1 year ago. Pertinent negatives include no chest pain, headaches, palpitations or shortness of breath. Risk factors for coronary artery disease include dyslipidemia, diabetes mellitus and smoking/tobacco exposure. Past treatments include angiotensin blockers.      Review of Systems  Constitutional: Negative for chills, fatigue, fever and unexpected weight change.  HENT: Negative for trouble swallowing and voice change.   Eyes: Negative for visual disturbance.  Respiratory: Negative for cough, shortness of breath and wheezing.   Cardiovascular: Negative for chest pain, palpitations and leg swelling.  Gastrointestinal: Negative for diarrhea, nausea and vomiting.  Endocrine: Negative for cold  intolerance, heat intolerance, polydipsia, polyphagia and polyuria.  Musculoskeletal: Negative for arthralgias and myalgias.  Skin: Negative for color change, pallor, rash and wound.  Neurological: Negative for seizures and headaches.  Psychiatric/Behavioral: Negative for confusion and suicidal ideas.    Objective:    BP 131/87   Pulse 70   Ht 5' 1.5" (1.562 m)   Wt 182 lb (82.6 kg)   BMI 33.83 kg/m   Wt Readings from Last 3 Encounters:  06/19/18 182 lb (82.6 kg)  06/19/18 182 lb (82.6 kg)  06/12/18 182 lb (82.6 kg)     Physical Exam  Constitutional: She is oriented to person, place, and time. She appears well-developed.  HENT:  Head: Normocephalic and atraumatic.  Eyes: EOM are normal.  Neck: Normal range of motion. Neck supple. No tracheal deviation present. No thyromegaly present.  Cardiovascular: Normal rate.  Pulmonary/Chest: Effort normal.  Abdominal: There is no tenderness. There is no guarding.  Musculoskeletal: Normal range of motion. She exhibits no edema.  Neurological: She is alert and oriented to person, place, and time. She has normal reflexes. No cranial nerve deficit. Coordination normal.  Skin: Skin is warm and dry. No rash noted. No erythema. No pallor.  Psychiatric: She has a normal mood and affect. Her behavior is normal. Judgment and thought content normal.     CMP ( most recent) CMP     Component Value Date/Time   NA 136 06/08/2018 0929   K 4.1 06/08/2018 0929   CL 101 06/08/2018 0929   CO2 27 06/08/2018 0929   GLUCOSE 131 (H) 06/08/2018 0929   BUN 14 06/08/2018 0929   CREATININE 0.71 06/08/2018 0929   CREATININE 0.84 11/18/2016 0844   CALCIUM 9.0 06/08/2018 0929   PROT 7.5 06/08/2018 0929   ALBUMIN 4.5 06/08/2018 0929   AST 26 06/08/2018 0929   ALT 34 06/08/2018 0929   ALKPHOS 52 06/08/2018 0929   BILITOT 0.9 06/08/2018 0929   GFRNONAA >60 06/08/2018 0929   GFRNONAA 77 11/18/2016 0844   GFRAA >60 06/08/2018 0929   GFRAA 89 11/18/2016  0844     Diabetic Labs (most recent): Lab Results  Component Value Date   HGBA1C  7.4 (H) 06/08/2018   HGBA1C 8.0 (H) 03/09/2018   HGBA1C 7.2 (H) 12/04/2017     Lipid Panel ( most recent) Lipid Panel     Component Value Date/Time   CHOL 167 06/08/2018 0929   TRIG 122 06/08/2018 0929   HDL 47 06/08/2018 0929   CHOLHDL 3.6 06/08/2018 0929   VLDL 24 06/08/2018 0929   LDLCALC 96 06/08/2018 0929      Lab Results  Component Value Date   TSH 1.601 06/08/2018   TSH 1.724 06/08/2017   TSH 1.620 06/08/2015   TSH 1.739 06/23/2014   TSH 1.659 04/15/2013   TSH 1.634 01/11/2012   TSH 1.344 11/04/2010   TSH 1.349 10/16/2009   TSH 2.028 01/25/2008   TSH 1.863 08/23/2006   FREET4 0.92 06/08/2018      Assessment & Plan:   1. Uncontrolled type 2 diabetes mellitus with hyperglycemia ([place])  - Jordyan H Puccini has currently uncontrolled symptomatic type 2 DM since 57 years of age. -She returns with significantly improved A1c of 7.4%. - Recent labs reviewed, showing mild microalbuminuria.  -She does not report any gross complications from her diabetes, howeverMonique H Schoene remains at a high risk for acute and chronic complications of type 2 diabetes which include CAD, CVA, CKD, retinopathy, and neuropathy. These are all discussed in l with the patient.  - I have counseled her on diet management and weight loss, by adopting a carbohydrate restricted/protein rich diet.  -  Suggestion is made for her to avoid simple carbohydrates  from her diet including Cakes, Sweet Desserts / Pastries, Ice Cream, Soda (diet and regular), Sweet Tea, Candies, Chips, Cookies, Store Bought Juices, Alcohol in Excess of  1-2 drinks a day, Artificial Sweeteners, and "Sugar-free" Products. This will help patient to have stable blood glucose profile and potentially avoid unintended weight gain.  - I encouraged her to switch to  unprocessed or minimally processed complex starch and increased protein intake  (animal or plant source), fruits, and vegetables.  - she is advised to stick to a routine mealtimes to eat 3 meals  a day and avoid unnecessary snacks ( to snack only to correct hypoglycemia).   - she is scheduled  with Jearld Fenton, RDN, CDE for individualized diabetes education.  - I have approached her with the following individualized plan to manage diabetes and patient agrees:  -Based on her return with near target A1c of 7.4% and 13 pounds of weight loss in the interim, she will not require insulin treatment at this time.    -She will continue to benefit from medications which would help her lose more weight or avoid weight gain.  -Accordingly, I discussed and continued Trulicity 1.5 mg subcutaneously weekly, and metformin 1000 mg p.o. twice daily after breakfast and supper-therapeutically suitable for the patient.    - Patient specific target  A1c;  LDL, HDL, Triglycerides, and  Waist Circumference were discussed in detail.  2) BP/HTN: Her blood pressure is controlled to target.  She has mild microalbuminuria.    I advised her to continue her current medications including losartan 25 mg p.o. daily.   3) Lipids/HPL:   Her recent lipid panel showed significantly improved LDL of 96 from 147.  She is tolerating her current dose of Livalo 2 mg p.o. nightly, advised to continue.    4)  Weight/Diet: She is progressing very well on weight control, lost 13 pounds since June 2019.  CDE Consult has been  initiated , exercise, and detailed carbohydrates  information provided.  5) Chronic Care/Health Maintenance:  -she  is on ACEI/ARB and Statin medications and  is encouraged to continue to follow up with Ophthalmology, Dentist,  Podiatrist at least yearly or according to recommendations, and advised to  stay away from smoking. I have recommended yearly flu vaccine and pneumonia vaccination at least every 5 years; moderate intensity exercise for up to 150 minutes weekly; and  sleep for at least 7  hours a day.  - I advised patient to maintain close follow up with Fayrene Helper, MD for primary care needs.  - Time spent with the patient: 25 min, of which >50% was spent in reviewing her  current and  previous labs, previous treatments, and medications doses and developing a plan for long-term care.  LAMYIAH CRAWSHAW participated in the discussions, expressed understanding, and voiced agreement with the above plans.  All questions were answered to her satisfaction. she is encouraged to contact clinic should she have any questions or concerns prior to her return visit.  Follow up plan: - Return in about 4 months (around 10/19/2018) for Follow up with Pre-visit Labs.  Glade Lloyd, MD [facility] [address] [place], [place] [address] Phone: [phone]  Fax: [phone]    06/19/2018, 11:35 AM  This note was partially dictated with voice recognition software. Similar sounding words can be transcribed inadequately or may not  be corrected upon review.

## 2018-06-19 NOTE — Progress Notes (Signed)
  Medical Nutrition Therapy:  Appt start time: 0800 end time:  0900.   Assessment:  Primary concerns today: Diabetes Type 2 Lost 5 lbs. Changes made: Cut out snacks. Eating three meals at times recommended. Not eating past 7 pm. Has increased watlking to 45 minutes 4-6 times per week.  Truclicity and Metformin 1000 mg/dl. NO artifical sweetners. . Sees Dr. Dorris Fetch, Endocrinology. Walks daily 45 minutes and enjoys it. Engaged to make changes with eating habits, portions and better carb management.  Lab Results  Component Value Date   HGBA1C 7.4 (H) 06/08/2018    Preferred Learning Style:    No preference indicated   Learning Readiness:    Ready  Change in progress   MEDICATIONS: see list   DIETARY INTAKE:  24-hr recall:  B ( AM): Bagel witih cream cheese.  Snk ( AM): water L ( PM): Salad with  Chicken,   With cup of fruit or meat, broccoli, carrots,  Snk ( PM): none D ( PM): baked chicken, salad with caesar dressing,  Water, Snk ( PM):  Beverages: water, unsweet tea,   Usual physical activity: Walks 45 minutes daily.  Estimated energy needs: 1200-1500  calories 135 g carbohydrates 90 g protein 33 g fat  Progress Towards Goal(s):  In progress.   Nutritional Diagnosis:  NB-1.1 Food and nutrition-related knowledge deficit As related to Diabetes Type 2..  As evidenced by A1C 8%..    Intervention:  Nutrition and Diabetes education provided on My Plate, CHO counting, meal planning, portion sizes, timing of meals, avoiding snacks between meals unless having a low blood sugar, target ranges for A1C and blood sugars, signs/symptoms and treatment of hyper/hypoglycemia, monitoring blood sugars, taking medications as prescribed, benefits of exercising 30 minutes per day and prevention of complications of DM. [place] Kitchen Goal 1. Start eating whole wheat bread 2 Keep exercising. 3. Lose 1 lb per week  Teaching Method Utilized:  Visual Auditory Hands on  Handouts given during  visit include:  The Plate Method  Meal Plan Card   Barriers to learning/adherence to lifestyle change: none  Demonstrated degree of understanding via:  Teach Back   Monitoring/Evaluation:  Dietary intake, exercise, meal planning, and body weight in 1-3 month(s).

## 2018-06-19 NOTE — Patient Instructions (Signed)

## 2018-07-02 ENCOUNTER — Other Ambulatory Visit: Payer: Self-pay | Admitting: Family Medicine

## 2018-07-02 DIAGNOSIS — I10 Essential (primary) hypertension: Secondary | ICD-10-CM

## 2018-07-02 MED FILL — CITALOPRAM HBR 20 MG TABLET: 20 | 90 days supply | Qty: 90 | Fill #0

## 2018-07-02 MED FILL — SHIPPING COST: 1 days supply | Qty: 1 | Fill #10

## 2018-07-02 MED FILL — LOSARTAN POTASSIUM 25 MG TA: 25 | 90 days supply | Qty: 90 | Fill #0

## 2018-07-11 MED FILL — SHIPPING COST: 1 days supply | Qty: 1 | Fill #11

## 2018-07-11 MED FILL — metFORMIN HCL 1000 MG TABS: 1000 | 90 days supply | Qty: 180 | Fill #1

## 2018-07-11 MED FILL — TRULICITY 1.5 MG/0.5 ML PEN: 1.5 | 84 days supply | Qty: 6 | Fill #1

## 2018-07-30 MED FILL — ACCU-CHEK GUIDE TEST STRIP: 90 days supply | Qty: 200 | Fill #1

## 2018-07-30 MED FILL — SHIPPING COST: 1 days supply | Qty: 1 | Fill #12

## 2018-09-02 MED FILL — MYCOPHENOLATE 500 MG TABLET: 500 | 90 days supply | Qty: 90 | Fill #2

## 2018-09-03 MED FILL — SHIPPING COST: 1 days supply | Qty: 1 | Fill #13

## 2018-09-17 ENCOUNTER — Other Ambulatory Visit: Payer: Self-pay | Admitting: Family Medicine

## 2018-09-17 DIAGNOSIS — I10 Essential (primary) hypertension: Secondary | ICD-10-CM

## 2018-09-17 MED FILL — LOSARTAN POTASSIUM 25 MG TA: 25 | 30 days supply | Qty: 30 | Fill #0

## 2018-09-17 MED FILL — SHIPPING COST: 1 days supply | Qty: 1 | Fill #14

## 2018-09-17 MED FILL — CITALOPRAM HBR 20 MG TABLET: 20 | 90 days supply | Qty: 90 | Fill #1

## 2018-09-20 ENCOUNTER — Ambulatory Visit (HOSPITAL_COMMUNITY)
Admission: RE | Admit: 2018-09-20 | Discharge: 2018-09-20 | Disposition: A | Discharge status: Home / Self Care | Payer: 59 | Source: Ambulatory Visit | Attending: Acute Care | Admitting: Acute Care

## 2018-09-20 DIAGNOSIS — Z122 Encounter for screening for malignant neoplasm of respiratory organs: Secondary | ICD-10-CM

## 2018-09-20 DIAGNOSIS — R918 Other nonspecific abnormal finding of lung field: Secondary | ICD-10-CM | POA: Insufficient documentation

## 2018-09-20 DIAGNOSIS — K760 Fatty (change of) liver, not elsewhere classified: Secondary | ICD-10-CM | POA: Insufficient documentation

## 2018-09-20 DIAGNOSIS — I700 Atherosclerosis of aorta: Secondary | ICD-10-CM | POA: Insufficient documentation

## 2018-09-20 DIAGNOSIS — Z87891 Personal history of nicotine dependence: Secondary | ICD-10-CM

## 2018-09-21 ENCOUNTER — Ambulatory Visit (HOSPITAL_COMMUNITY): Admission: RE | Admit: 2018-09-21 | Payer: 59 | Source: Ambulatory Visit

## 2018-09-28 ENCOUNTER — Telehealth: Payer: Self-pay | Admitting: Acute Care

## 2018-09-28 DIAGNOSIS — Z122 Encounter for screening for malignant neoplasm of respiratory organs: Secondary | ICD-10-CM

## 2018-09-28 DIAGNOSIS — Z87891 Personal history of nicotine dependence: Secondary | ICD-10-CM

## 2018-09-28 NOTE — Telephone Encounter (Signed)
LMTC x 1  

## 2018-10-08 ENCOUNTER — Other Ambulatory Visit: Payer: Self-pay | Admitting: "Endocrinology

## 2018-10-08 ENCOUNTER — Encounter: Payer: Self-pay | Admitting: Family Medicine

## 2018-10-08 MED FILL — SHIPPING COST: 1 days supply | Qty: 1 | Fill #15

## 2018-10-08 MED FILL — ACCU-CHEK FASTCLIX LANCETS: 90 days supply | Qty: 102 | Fill #1

## 2018-10-09 ENCOUNTER — Telehealth: Payer: Self-pay | Admitting: Family Medicine

## 2018-10-09 ENCOUNTER — Encounter: Payer: Self-pay | Admitting: Family Medicine

## 2018-10-09 DIAGNOSIS — E559 Vitamin D deficiency, unspecified: Secondary | ICD-10-CM

## 2018-10-09 DIAGNOSIS — E119 Type 2 diabetes mellitus without complications: Secondary | ICD-10-CM

## 2018-10-09 NOTE — Addendum Note (Signed)
Addended by: Eual Fines on: 10/09/2018 02:21 PM   Modules accepted: Orders

## 2018-10-09 NOTE — Telephone Encounter (Signed)
Will cancel dec appt and call back tomorrow to reschedule til mid to end of jan per md. Will come in the same day her dec visit was scheduled to get the 2nd shingrix vaccine

## 2018-10-09 NOTE — Telephone Encounter (Signed)
Pls se pt messages. In short, prepare fasting lipid, CBC and Vit D to be drawn mid to end Jan Discuss her OV choice and deal with that when she picks up order  NEEDS shingrix # 2asap during the month of December that cannot be put off!  ?? pls ask

## 2018-10-10 MED FILL — metFORMIN HCL 1000 MG TABS: 1000 | 90 days supply | Qty: 180 | Fill #0

## 2018-10-10 MED FILL — TRULICITY 1.5 MG/0.5 ML PEN: 1.5 | 84 days supply | Qty: 6 | Fill #0

## 2018-10-11 MED FILL — LOSARTAN POTASSIUM 25 MG TA: 25 | 30 days supply | Qty: 30 | Fill #1

## 2018-10-15 ENCOUNTER — Ambulatory Visit: Payer: 59 | Admitting: Family Medicine

## 2018-10-16 ENCOUNTER — Other Ambulatory Visit: Payer: Self-pay | Admitting: Family Medicine

## 2018-10-16 DIAGNOSIS — Z1231 Encounter for screening mammogram for malignant neoplasm of breast: Secondary | ICD-10-CM

## 2018-10-17 ENCOUNTER — Other Ambulatory Visit: Payer: Self-pay | Admitting: "Endocrinology

## 2018-10-18 ENCOUNTER — Ambulatory Visit (HOSPITAL_COMMUNITY): Payer: 59

## 2018-10-22 ENCOUNTER — Ambulatory Visit: Payer: 59 | Admitting: "Endocrinology

## 2018-10-22 ENCOUNTER — Ambulatory Visit: Payer: 59 | Admitting: Nutrition

## 2018-10-25 ENCOUNTER — Ambulatory Visit: Payer: 59

## 2018-10-25 ENCOUNTER — Telehealth: Payer: Self-pay

## 2018-10-25 NOTE — Telephone Encounter (Signed)
Patient came in to office today for her second Shingrix vaccine, and before receiving the vaccine, made this nurse aware of the allergic reaction she had to the first injection of this medication. Patient complained of swelling, hardness, red, and warm to the touch in the arm where the injection was given. Also complained of pain, soreness to the touch, fever and chills. Decision was made to talk with Dr. Moshe Cipro before receiving the second vaccine and per her advice, patient decided NOT to get the second vaccine.

## 2018-11-02 ENCOUNTER — Encounter (HOSPITAL_COMMUNITY): Payer: Self-pay

## 2018-11-02 ENCOUNTER — Encounter: Payer: Self-pay | Admitting: "Endocrinology

## 2018-11-02 ENCOUNTER — Ambulatory Visit (HOSPITAL_COMMUNITY)
Admission: RE | Admit: 2018-11-02 | Discharge: 2018-11-02 | Disposition: A | Discharge status: Home / Self Care | Payer: 59 | Source: Ambulatory Visit | Attending: Family Medicine | Admitting: Family Medicine

## 2018-11-02 DIAGNOSIS — Z1231 Encounter for screening mammogram for malignant neoplasm of breast: Secondary | ICD-10-CM | POA: Diagnosis not present

## 2018-11-02 LAB — HM DIABETES EYE EXAM

## 2018-11-28 MED FILL — LOSARTAN POTASSIUM 25 MG TA: 25 | 30 days supply | Qty: 30 | Fill #2

## 2018-11-28 MED FILL — SHIPPING COST: 1 days supply | Qty: 1 | Fill #16

## 2018-11-29 ENCOUNTER — Other Ambulatory Visit (HOSPITAL_COMMUNITY)
Admission: RE | Admit: 2018-11-29 | Discharge: 2018-11-29 | Disposition: A | Discharge status: Home / Self Care | Payer: 59 | Source: Ambulatory Visit | Attending: Family Medicine | Admitting: Family Medicine

## 2018-11-29 ENCOUNTER — Encounter: Payer: Self-pay | Admitting: Family Medicine

## 2018-11-29 DIAGNOSIS — E119 Type 2 diabetes mellitus without complications: Secondary | ICD-10-CM | POA: Insufficient documentation

## 2018-11-29 DIAGNOSIS — E559 Vitamin D deficiency, unspecified: Secondary | ICD-10-CM | POA: Diagnosis not present

## 2018-11-29 LAB — CBC
HEMATOCRIT: 44.5 % (ref 36.0–46.0)
HEMOGLOBIN: 14.2 g/dL (ref 12.0–15.0)
MCH: 30.1 pg (ref 26.0–34.0)
MCHC: 31.9 g/dL (ref 30.0–36.0)
MCV: 94.5 fL (ref 80.0–100.0)
Platelets: 367 10*3/uL (ref 150–400)
RBC: 4.71 MIL/uL (ref 3.87–5.11)
RDW: 12.6 % (ref 11.5–15.5)
WBC: 7.7 10*3/uL (ref 4.0–10.5)
nRBC: 0 % (ref 0.0–0.2)

## 2018-11-29 LAB — HEMOGLOBIN A1C
Hgb A1c MFr Bld: 6.7 % — ABNORMAL HIGH (ref 4.8–5.6)
MEAN PLASMA GLUCOSE: 145.59 mg/dL

## 2018-11-29 LAB — COMPREHENSIVE METABOLIC PANEL
ALT: 28 U/L (ref 0–44)
AST: 21 U/L (ref 15–41)
Albumin: 4.7 g/dL (ref 3.5–5.0)
Alkaline Phosphatase: 50 U/L (ref 38–126)
Anion gap: 11 (ref 5–15)
BUN: 16 mg/dL (ref 6–20)
CHLORIDE: 98 mmol/L (ref 98–111)
CO2: 26 mmol/L (ref 22–32)
CREATININE: 0.68 mg/dL (ref 0.44–1.00)
Calcium: 9.6 mg/dL (ref 8.9–10.3)
GFR calc Af Amer: >60 mL/min (ref >60)
GFR calc non Af Amer: >60 mL/min (ref >60)
Glucose, Bld: 111 mg/dL — ABNORMAL HIGH (ref 70–99)
Potassium: 4.2 mmol/L (ref 3.5–5.1)
SODIUM: 135 mmol/L (ref 135–145)
Total Bilirubin: 0.9 mg/dL (ref 0.3–1.2)
Total Protein: 7.6 g/dL (ref 6.5–8.1)

## 2018-11-29 LAB — LIPID PANEL
Cholesterol: 258 mg/dL — ABNORMAL HIGH (ref 0–200)
HDL: 56 mg/dL (ref >40)
LDL Cholesterol: 162 mg/dL — ABNORMAL HIGH (ref 0–99)
Total CHOL/HDL Ratio: 4.6 RATIO
Triglycerides: 202 mg/dL — ABNORMAL HIGH (ref <150)
VLDL: 40 mg/dL (ref 0–40)

## 2018-11-30 LAB — VITAMIN D 25 HYDROXY (VIT D DEFICIENCY, FRACTURES): Vit D, 25-Hydroxy: 20.2 ng/mL — ABNORMAL LOW (ref 30.0–100.0)

## 2018-12-04 ENCOUNTER — Ambulatory Visit: Payer: 59 | Admitting: Family Medicine

## 2018-12-05 DIAGNOSIS — L121 Cicatricial pemphigoid: Secondary | ICD-10-CM | POA: Diagnosis not present

## 2018-12-05 DIAGNOSIS — Z5181 Encounter for therapeutic drug level monitoring: Secondary | ICD-10-CM | POA: Diagnosis not present

## 2018-12-05 MED FILL — SHIPPING COST: 1 days supply | Qty: 1 | Fill #17

## 2018-12-05 MED FILL — MYCOPHENOLATE 500 MG TABLET: 500 | 90 days supply | Qty: 90 | Fill #0

## 2018-12-06 ENCOUNTER — Encounter: Payer: Self-pay | Admitting: Family Medicine

## 2018-12-06 ENCOUNTER — Ambulatory Visit: Payer: 59 | Admitting: Family Medicine

## 2018-12-06 ENCOUNTER — Ambulatory Visit (HOSPITAL_COMMUNITY)
Admission: RE | Admit: 2018-12-06 | Discharge: 2018-12-06 | Disposition: A | Discharge status: Home / Self Care | Payer: 59 | Source: Ambulatory Visit | Attending: Family Medicine | Admitting: Family Medicine

## 2018-12-06 VITALS — BP 144/82 | HR 83 | Resp 15 | Ht 62.0 in | Wt 182.0 lb

## 2018-12-06 DIAGNOSIS — M79661 Pain in right lower leg: Secondary | ICD-10-CM | POA: Diagnosis not present

## 2018-12-06 DIAGNOSIS — E669 Obesity, unspecified: Secondary | ICD-10-CM

## 2018-12-06 DIAGNOSIS — L121 Cicatricial pemphigoid: Secondary | ICD-10-CM | POA: Diagnosis not present

## 2018-12-06 DIAGNOSIS — E782 Mixed hyperlipidemia: Secondary | ICD-10-CM

## 2018-12-06 DIAGNOSIS — I10 Essential (primary) hypertension: Secondary | ICD-10-CM

## 2018-12-06 DIAGNOSIS — M7989 Other specified soft tissue disorders: Secondary | ICD-10-CM | POA: Insufficient documentation

## 2018-12-06 DIAGNOSIS — E1159 Type 2 diabetes mellitus with other circulatory complications: Secondary | ICD-10-CM | POA: Diagnosis not present

## 2018-12-10 ENCOUNTER — Ambulatory Visit: Payer: 59 | Admitting: "Endocrinology

## 2018-12-10 ENCOUNTER — Encounter: Payer: Self-pay | Admitting: "Endocrinology

## 2018-12-10 ENCOUNTER — Ambulatory Visit: Payer: 59 | Admitting: Nutrition

## 2018-12-10 VITALS — BP 147/90 | HR 74 | Ht 62.0 in | Wt 180.0 lb

## 2018-12-10 DIAGNOSIS — E1165 Type 2 diabetes mellitus with hyperglycemia: Secondary | ICD-10-CM

## 2018-12-10 DIAGNOSIS — I10 Essential (primary) hypertension: Secondary | ICD-10-CM | POA: Diagnosis not present

## 2018-12-10 DIAGNOSIS — E559 Vitamin D deficiency, unspecified: Secondary | ICD-10-CM

## 2018-12-10 DIAGNOSIS — E782 Mixed hyperlipidemia: Secondary | ICD-10-CM

## 2018-12-10 MED ORDER — VITAMIN D3 125 MCG (5000 UT) PO CAPS: 5000.0000 [IU] | ORAL_CAPSULE | Freq: Every day | ORAL | 0 refills | Status: DC

## 2018-12-10 MED ORDER — LOSARTAN POTASSIUM 50 MG PO TABS: 25.0000 mg | ORAL_TABLET | Freq: Every day | ORAL | 1 refills | Status: DC

## 2018-12-10 MED ORDER — EVOLOCUMAB 140 MG/ML ~~LOC~~ SOAJ: 140.0000 mg | SUBCUTANEOUS | 3 refills | Status: DC

## 2018-12-10 MED FILL — SHIPPING COST: 1 days supply | Qty: 1 | Fill #0

## 2018-12-10 MED FILL — LOSARTAN POTASSIUM 50 MG TA: 50 | 30 days supply | Qty: 15 | Fill #0

## 2018-12-10 MED FILL — VITAMIN D3 5,000 UNIT CAP: 125 MCG | 90 days supply | Qty: 90 | Fill #0

## 2018-12-10 MED FILL — VITAMIN D 125 MCG (5000 UT): 125 MCG | 90 days supply | Qty: 90 | Fill #0

## 2018-12-10 NOTE — Progress Notes (Signed)
Endocrinology follow-up  Note       12/10/2018, 1:01 PM   Subjective:    Patient ID: Alicia Neal, female    DOB: [birth year].  Alicia Neal is being seen in follow-up for management of currently uncontrolled symptomatic diabetes requested by  Fayrene Helper, MD.   Past Medical History:  Diagnosis Date  . Adrenal adenoma    right side  . Allergic rhinitis   . Anxiety disorder   . Carpal tunnel syndrome of left wrist   . Chronic depression    Dr. Cheryln Manly  . GERD (gastroesophageal reflux disease)   . History of abnormal cervical Pap smear   . History of adenomatous polyp of colon   . History of hiatal hernia   . Hyperlipidemia   . Hypertension   . IBS (irritable bowel syndrome)   . OSA (obstructive sleep apnea)    per pt study yrs ago-- CPAP intolerant   . Pemphigoid, cicatricial    pruritic autoimmune blistering skin disorder  . PONV (postoperative nausea and vomiting)   . Pulmonary nodule, right pulmologist-  dr Nathaneil Canary mcquaid   x2    per CT 09-11-2015  . Trigger finger, left    ring finger  . Type 2 diabetes mellitus ([place])   . Wears glasses    Past Surgical History:  Procedure Laterality Date  . CARDIOVASCULAR STRESS TEST  04-30-2003   Small focus of decreased perfusion on rest images in the mid-distal anterior wall (worrisome for ischemia),  normal LV function and wall motion , ef 60%  . CARPAL TUNNEL RELEASE Left 11/26/2015   Procedure: CARPAL TUNNEL RELEASE;  Surgeon: Iran Planas, MD;  Location: [facility];  Service: Orthopedics;  Laterality: Left;  . CHOLECYSTECTOMY  1989  . TRANSTHORACIC ECHOCARDIOGRAM  04-30-2003   mild hypokinetic in the inferior and posterior wall, ef 50%/  trivial MR /  mild TR  . TRIGGER FINGER RELEASE Left 11/26/2015   Procedure: RELEASE TRIGGER FINGER/A-1 PULLEY LEFT RING  FINGER;  Surgeon: Iran Planas, MD;  Location: [facility];  Service: Orthopedics;   Laterality: Left;  . TUBAL LIGATION  2000   Social History   Socioeconomic History  . Marital status: Single    Spouse name: Not on file  . Number of children: 1  . Years of education: Not on file  . Highest education level: Not on file  Occupational History  . Not on file  Social Needs  . Financial resource strain: Not on file  . Food insecurity:    Worry: Not on file    Inability: Not on file  . Transportation needs:    Medical: Not on file    Non-medical: Not on file  Tobacco Use  . Smoking status: Former Smoker    Packs/day: 1.00    Years: 40.00    Pack years: 40.00    Types: Cigarettes    Last attempt to quit: 11/07/2013    Years since quitting: 5.0  . Smokeless tobacco: Never Used  Substance and Sexual Activity  . Alcohol use: Yes    Alcohol/week: 0.0 standard drinks    Comment: occasional  .  Drug use: No  . Sexual activity: Not Currently    Birth control/protection: Post-menopausal  Lifestyle  . Physical activity:    Days per week: Not on file    Minutes per session: Not on file  . Stress: Not on file  Relationships  . Social connections:    Talks on phone: Not on file    Gets together: Not on file    Attends religious service: Not on file    Active member of club or organization: Not on file    Attends meetings of clubs or organizations: Not on file    Relationship status: Not on file  Other Topics Concern  . Not on file  Social History Narrative  . Not on file   Outpatient Encounter Medications as of 12/10/2018  Medication Sig  . Pitavastatin Calcium (LIVALO) 2 MG TABS Take 2 mg by mouth daily.  Marland Kitchen ACCU-CHEK FASTCLIX LANCETS MISC USE TO CHECK BLOOD SUGAR ONCE A DAY (DX E11.9)  . Cholecalciferol (VITAMIN D3) 125 MCG (5000 UT) CAPS Take 1 capsule (5,000 Units total) by mouth daily.  . citalopram (CELEXA) 20 MG tablet TAKE 1 TABLET BY MOUTH ONCE DAILY  . Evolocumab (REPATHA SURECLICK) 546 MG/ML SOAJ Inject 140 mg into the skin every 14 (fourteen) days.   . fluticasone (FLONASE) 50 MCG/ACT nasal spray Place 2 sprays into both nostrils daily. (Patient taking differently: Place 2 sprays into both nostrils daily as needed for allergies. )  . glucose blood (ACCU-CHEK GUIDE) test strip USE TO CHECK BLOOD SUGAR twice daily  (DX E11.9)  . losartan (COZAAR) 50 MG tablet Take 0.5 tablets (25 mg total) by mouth daily.  . metFORMIN (GLUCOPHAGE) 1000 MG tablet TAKE 1 TABLET (1,000 MG TOTAL) BY MOUTH 2 TIMES DAILY WITH A MEAL.  . mycophenolate (CELLCEPT) 500 MG tablet Take 500 mg by mouth daily.   . pantoprazole (PROTONIX) 40 MG tablet Take 1 tablet (40 mg total) by mouth daily.  . TRULICITY 1.5 EV/0.3JK SOPN INJECT 1.5 MG INTO THE SKIN ONCE A WEEK.  . [DISCONTINUED] losartan (COZAAR) 25 MG tablet TAKE 1 TABLET BY MOUTH DAILY.   No facility-administered encounter medications on file as of 12/10/2018.     ALLERGIES: Allergies  Allergen Reactions  . Ezetimibe-Simvastatin Other (See Comments)    REACTION: muscle aches  . Pravachol [Pravastatin Sodium] Other (See Comments)    Joint pains and memory loss  . Simvastatin Other (See Comments)    REACTION: muscle aches  . Zetia [Ezetimibe] Other (See Comments)    Generalized joint pains    VACCINATION STATUS: Immunization History  Administered Date(s) Administered  . Influenza Split 07/19/2013, 07/22/2014  . Pneumococcal Conjugate-13 06/25/2014  . Tdap 06/25/2014  . Zoster Recombinat (Shingrix) 06/12/2018    Diabetes  She presents for her follow-up diabetic visit. She has type 2 diabetes mellitus. Onset time: She was diagnosed at approximate age of 5 years. Her disease course has been improving (She reports A1c of 13% at the time of diagnosis, with subsequent improvement to near target range and recent reverse to 8% in May 2019.). Pertinent negatives for hypoglycemia include no confusion, headaches, hunger, pallor or seizures. Pertinent negatives for diabetes include no chest pain, no fatigue, no  polydipsia, no polyphagia and no polyuria. There are no hypoglycemic complications. Symptoms are improving. There are no diabetic complications. Risk factors for coronary artery disease include diabetes mellitus, dyslipidemia, hypertension, obesity, tobacco exposure and post-menopausal. Current diabetic treatments: She is currently on glipizide 5 mg p.o. twice  daily and Janumet 50/1000 mg p.o. twice daily. Her weight is decreasing steadily. She is following a generally unhealthy diet. When asked about meal planning, she reported none. She has not had a previous visit with a dietitian. She participates in exercise intermittently. An ACE inhibitor/angiotensin II receptor blocker is being taken. Eye exam is current.  Hyperlipidemia  This is a chronic problem. The current episode started more than 1 year ago. The problem is uncontrolled. Recent lipid tests were reviewed and are high. Exacerbating diseases include diabetes. Pertinent negatives include no chest pain, myalgias or shortness of breath. Treatments tried: Patient is not tolerating statins including Livalo. Risk factors for coronary artery disease include dyslipidemia, diabetes mellitus, hypertension and post-menopausal.  Hypertension  This is a chronic problem. The current episode started more than 1 year ago. The problem is uncontrolled. Pertinent negatives include no chest pain, headaches, palpitations or shortness of breath. Risk factors for coronary artery disease include dyslipidemia, diabetes mellitus and smoking/tobacco exposure. Past treatments include angiotensin blockers.     Review of Systems  Constitutional: Negative for chills, fatigue, fever and unexpected weight change.  HENT: Negative for trouble swallowing and voice change.   Eyes: Negative for visual disturbance.  Respiratory: Negative for cough, shortness of breath and wheezing.   Cardiovascular: Negative for chest pain, palpitations and leg swelling.  Gastrointestinal:  Negative for diarrhea, nausea and vomiting.  Endocrine: Negative for cold intolerance, heat intolerance, polydipsia, polyphagia and polyuria.  Musculoskeletal: Negative for arthralgias and myalgias.  Skin: Negative for color change, pallor, rash and wound.  Neurological: Negative for seizures and headaches.  Psychiatric/Behavioral: Negative for confusion and suicidal ideas.    Objective:    BP (!) 147/90   Pulse 74   Ht 5\' 2"  (1.575 m)   Wt 180 lb (81.6 kg)   BMI 32.92 kg/m   Wt Readings from Last 3 Encounters:  12/10/18 180 lb (81.6 kg)  12/06/18 182 lb (82.6 kg)  06/19/18 182 lb (82.6 kg)     Physical Exam Constitutional:      Appearance: She is well-developed.  HENT:     Head: Normocephalic and atraumatic.  Neck:     Musculoskeletal: Normal range of motion and neck supple.     Thyroid: No thyromegaly.     Trachea: No tracheal deviation.  Cardiovascular:     Rate and Rhythm: Normal rate.  Pulmonary:     Effort: Pulmonary effort is normal.  Abdominal:     Tenderness: There is no abdominal tenderness. There is no guarding.  Musculoskeletal: Normal range of motion.  Skin:    General: Skin is warm and dry.     Coloration: Skin is not pale.     Findings: No erythema or rash.  Neurological:     Mental Status: She is alert and oriented to person, place, and time.     Cranial Nerves: No cranial nerve deficit.     Coordination: Coordination normal.     Deep Tendon Reflexes: Reflexes are normal and symmetric.  Psychiatric:        Behavior: Behavior normal.        Thought Content: Thought content normal.        Judgment: Judgment normal.      CMP ( most recent) CMP     Component Value Date/Time   NA 135 11/29/2018 0830   K 4.2 11/29/2018 0830   CL 98 11/29/2018 0830   CO2 26 11/29/2018 0830   GLUCOSE 111 (H) 11/29/2018 0830   BUN  16 11/29/2018 0830   CREATININE 0.68 11/29/2018 0830   CREATININE 0.84 11/18/2016 0844   CALCIUM 9.6 11/29/2018 0830   PROT 7.6  11/29/2018 0830   ALBUMIN 4.7 11/29/2018 0830   AST 21 11/29/2018 0830   ALT 28 11/29/2018 0830   ALKPHOS 50 11/29/2018 0830   BILITOT 0.9 11/29/2018 0830   GFRNONAA >60 11/29/2018 0830   GFRNONAA 77 11/18/2016 0844   GFRAA >60 11/29/2018 0830   GFRAA 89 11/18/2016 0844     Diabetic Labs (most recent): Lab Results  Component Value Date   HGBA1C 6.7 (H) 11/29/2018   HGBA1C 7.4 (H) 06/08/2018   HGBA1C 8.0 (H) 03/09/2018     Lipid Panel ( most recent) Lipid Panel     Component Value Date/Time   CHOL 258 (H) 11/29/2018 0830   TRIG 202 (H) 11/29/2018 0830   HDL 56 11/29/2018 0830   CHOLHDL 4.6 11/29/2018 0830   VLDL 40 11/29/2018 0830   LDLCALC 162 (H) 11/29/2018 0830      Lab Results  Component Value Date   TSH 1.601 06/08/2018   TSH 1.724 06/08/2017   TSH 1.620 06/08/2015   TSH 1.739 06/23/2014   TSH 1.659 04/15/2013   TSH 1.634 01/11/2012   TSH 1.344 11/04/2010   TSH 1.349 10/16/2009   TSH 2.028 01/25/2008   TSH 1.863 08/23/2006   FREET4 0.92 06/08/2018      Assessment & Plan:   1. Uncontrolled type 2 diabetes mellitus with hyperglycemia ([place])  - Alicia Neal has currently uncontrolled symptomatic type 2 DM since 58 years of age. -She returns with significantly improved A1c of 6.7%.   - Recent labs reviewed, showing mild microalbuminuria.  -She does not report any gross complications from her diabetes, howeverMonique H Neal remains at a high risk for acute and chronic complications of type 2 diabetes which include CAD, CVA, CKD, retinopathy, and neuropathy. These are all discussed in l with the patient.  - I have counseled her on diet management and weight loss, by adopting a carbohydrate restricted/protein rich diet.  - Patient admits there is a room for improvement in her diet and drink choices. -  Suggestion is made for her to avoid simple carbohydrates  from her diet including Cakes, Sweet Desserts / Pastries, Ice Cream, Soda (diet and regular),  Sweet Tea, Candies, Chips, Cookies, Store Bought Juices, Alcohol in Excess of  1-2 drinks a day, Artificial Sweeteners, and "Sugar-free" Products. This will help patient to have stable blood glucose profile and potentially avoid unintended weight gain.  - I encouraged her to switch to  unprocessed or minimally processed complex starch and increased protein intake (animal or plant source), fruits, and vegetables.  - she is advised to stick to a routine mealtimes to eat 3 meals  a day and avoid unnecessary snacks ( to snack only to correct hypoglycemia).   - she is scheduled  with Jearld Fenton, RDN, CDE for individualized diabetes education.  - I have approached her with the following individualized plan to manage diabetes and patient agrees:   -Based on her return with near target A1c of 6.7% and 15 pounds of weight loss over last year, she will not require insulin treatment at this time.    -She will continue to benefit from medications which would help her lose more weight or avoid weight gain.  -Accordingly, she is advised to continue Trulicity 1.5 mg subcutaneously weekly, and metformin 1000 mg p.o. twice daily after breakfast and supper-therapeutically suitable for  the patient.    - Patient specific target  A1c;  LDL, HDL, Triglycerides, and  Waist Circumference were discussed in detail.  2) BP/HTN: Her blood pressure is not controlled to target.  She has mild microalbuminuria.  He is advised to increase her losartan to 50 mg p.o. daily.  3) Lipids/HPL:   Her recent lipid panel showed significantly increased LDL to 162 from 96.  She is not tolerating Livalo even at a lower dose.  Based on her risk profile, she will benefit from aggressive lipid management.  I discussed and prescribed Repatha 140 mg subcutaneously every 2 weeks.       4)  Weight/Diet: She is progressing very well on weight control, lost 15 pounds since June 2019.  CDE Consult has been  initiated , exercise, and detailed  carbohydrates information provided.  5) Chronic Care/Health Maintenance:  -she  is on ACEI/ARB and Statin medications and  is encouraged to continue to follow up with Ophthalmology, Dentist,  Podiatrist at least yearly or according to recommendations, and advised to  stay away from smoking. I have recommended yearly flu vaccine and pneumonia vaccination at least every 5 years; moderate intensity exercise for up to 150 minutes weekly; and  sleep for at least 7 hours a day.  - I advised patient to maintain close follow up with Fayrene Helper, MD for primary care needs.  - Time spent with the patient: 25 min, of which >50% was spent in reviewing her blood glucose logs , discussing her hypoglycemia and hyperglycemia episodes, reviewing her current and  previous labs / studies and medications  doses and developing a plan to avoid hypoglycemia and hyperglycemia. Please refer to Patient Instructions for Blood Glucose Monitoring and Insulin/Medications Dosing Guide"  in media tab for additional information. Alicia Neal participated in the discussions, expressed understanding, and voiced agreement with the above plans.  All questions were answered to her satisfaction. she is encouraged to contact clinic should she have any questions or concerns prior to her return visit.   Follow up plan: - Return in about 4 months (around 04/10/2019) for Follow up with Pre-visit Labs.  Glade Lloyd, MD [facility] [address] [place], [place] [address] Phone: [phone]  Fax: [phone]    12/10/2018, 1:01 PM  This note was partially dictated with voice recognition software. Similar sounding words can be transcribed inadequately or may not  be corrected upon review.

## 2018-12-10 NOTE — Patient Instructions (Signed)

## 2018-12-16 ENCOUNTER — Encounter: Payer: Self-pay | Admitting: Family Medicine

## 2018-12-16 DIAGNOSIS — M79661 Pain in right lower leg: Secondary | ICD-10-CM | POA: Insufficient documentation

## 2018-12-16 NOTE — Assessment & Plan Note (Signed)
Managed by Endo and very well controlled Alicia Neal is reminded of the importance of commitment to daily physical activity for 30 minutes or more, as able and the need to limit carbohydrate intake to 30 to 60 grams per meal to help with blood sugar control.   The need to take medication as prescribed, test blood sugar as directed, and to call between visits if there is a concern that blood sugar is uncontrolled is also discussed.   Alicia Neal is reminded of the importance of daily foot exam, annual eye examination, and good blood sugar, blood pressure and cholesterol control.  Diabetic Labs Latest Ref Rng & Units 11/29/2018 06/08/2018 03/09/2018 12/04/2017 09/06/2017  HbA1c 4.8 - 5.6 % 6.7(H) 7.4(H) 8.0(H) 7.2(H) -  Microalbumin Not Estab. ug/mL - 7.8(H) - - <3.0(H)  Micro/Creat Ratio 0.0 - 30.0 mg/g creat - 19.9 - - <10.4  Chol 0 - 200 mg/dL 258(H) 167 229(H) 225(H) -  HDL >40 mg/dL 56 47 54 56 -  Calc LDL 0 - 99 mg/dL 162(H) 96 147(H) 122(H) -  Triglycerides <150 mg/dL 202(H) 122 140 236(H) -  Creatinine 0.44 - 1.00 mg/dL 0.68 0.71 0.64 0.73 -   BP/Weight 12/10/2018 12/06/2018 06/19/2018 06/19/2018 06/12/2018 05/29/2018 11/17/7354  Systolic BP 701 410 - 301 314 - 388  Diastolic BP 90 82 - 87 78 - 83  Wt. (Lbs) 180 182 182 182 182 187 190  BMI 32.92 33.29 33.83 33.83 33.29 34.76 35.32   Foot/eye exam completion dates Latest Ref Rng & Units 11/02/2018 03/15/2018  Eye Exam No Retinopathy No Retinopathy -  Foot Form Completion - - Done

## 2018-12-16 NOTE — Progress Notes (Signed)
Alicia Neal     MRN: [id number]      DOB: 11-23-1960   HPI Alicia Neal is here for follow up and re-evaluation of chronic medical conditions, medication management and review of any available recent lab and radiology data.  Preventive health is updated, specifically  Cancer screening and Immunization.   Had severe localized reaction  To shingrix #1 so will not administer #2    1 day h/o right calf pain and swelling, denies any dyspnea or hemoptysis, pain was initially intermittent , now constant Denies polyuria, polydipsia, blurred vision , or hypoglycemic episodes. C/o intolerance to higher dose of livalo that was initiated by Endo so she stopped taking this, lipids are now markedly elevated , will resume lower dose of livalo reportedly  ROS Denies recent fever or chills. Denies sinus pressure, nasal congestion, ear pain or sore throat. Denies chest congestion, productive cough or wheezing. Denies chest pains, palpitations and leg swelling Denies abdominal pain, nausea, vomiting,diarrhea or constipation.   Denies dysuria, frequency, hesitancy or incontinence.  Denies headaches, seizures, numbness, or tingling. Denies  Uncontrolled depression, anxiety or insomnia. Denies skin break down or rash.   PE  BP (!) 144/82   Pulse 83   Resp 15   Ht 5\' 2"  (1.575 m)   Wt 182 lb (82.6 kg)   SpO2 98%   BMI 33.29 kg/m   Patient alert and oriented and in no cardiopulmonary distress.  HEENT: No facial asymmetry, EOMI,   oropharynx pink and moist.  Neck supple no JVD, no mass.  Chest: Clear to auscultation bilaterally.  CVS: S1, S2 no murmurs, no S3.Regular rate.  ABD: Soft non tender.   Ext: No edema  MS: Adequate ROM spine, shoulders, hips and knees. Discrepancy in calf size as documented with positive homan's  Skin: Intact, no ulcerations or rash noted.  Psych: Good eye contact, normal affect. Memory intact not anxious or depressed appearing.  CNS: CN 2-12 intact,  power,  normal throughout.no focal deficits noted.   Assessment & Plan  Right calf pain 1 dy h/o acute onset right calf pain and swelling with abnormal exam suggestive of acute DVT, size discrepancy, tenderness , warmth and erythema with positive Homan's stat doppler ordered  HTN, goal below 130/80 Uncontrolled, pt anxious and rushed at visit, will need to re evaluate this DASH diet and commitment to daily physical activity for a minimum of 30 minutes discussed and encouraged, as a part of hypertension management. The importance of attaining a healthy weight is also discussed.  BP/Weight 12/10/2018 12/06/2018 06/19/2018 06/19/2018 06/12/2018 05/29/2018 12/27/2540  Systolic BP 706 237 - 628 315 - 176  Diastolic BP 90 82 - 87 78 - 83  Wt. (Lbs) 180 182 182 182 182 187 190  BMI 32.92 33.29 33.83 33.83 33.29 34.76 35.32       Mixed hyperlipidemia Hyperlipidemia:Low fat diet discussed and encouraged.   Lipid Panel  Lab Results  Component Value Date   CHOL 258 (H) 11/29/2018   HDL 56 11/29/2018   LDLCALC 162 (H) 11/29/2018   TRIG 202 (H) 11/29/2018   CHOLHDL 4.6 11/29/2018  deteriorated and uncontrolled, reports intolerance to higher dose of livalo, stated that a she would revert to lower dose , however has since been started on Repatha by endo, will reach out to see what she is actually taking     Obesity (BMI 30.0-34.9) Unchanged  Patient re-educated about  the importance of commitment to a  minimum of 150 minutes  of exercise per week as able.  The importance of healthy food choices with portion control discussed, as well as eating regularly and within a 12 hour window most days. The need to choose "clean , green" food 50 to 75% of the time is discussed, as well as to make water the primary drink and set a goal of 64 ounces water daily.  Encouraged to start a food diary,  and to consider  joining a support group. Sample diet sheets offered. Goals set by the patient for the next  several months.   Weight /BMI 12/10/2018 12/06/2018 06/19/2018  WEIGHT 180 lb 182 lb 182 lb  HEIGHT 5\' 2"  5\' 2"  5' 1.5"  BMI 32.92 kg/m2 33.29 kg/m2 33.83 kg/m2      Type 2 diabetes mellitus with vascular disease ([place]) Managed by Endo and very well controlled Alicia Neal is reminded of the importance of commitment to daily physical activity for 30 minutes or more, as able and the need to limit carbohydrate intake to 30 to 60 grams per meal to help with blood sugar control.   The need to take medication as prescribed, test blood sugar as directed, and to call between visits if there is a concern that blood sugar is uncontrolled is also discussed.   Alicia Neal is reminded of the importance of daily foot exam, annual eye examination, and good blood sugar, blood pressure and cholesterol control.  Diabetic Labs Latest Ref Rng & Units 11/29/2018 06/08/2018 03/09/2018 12/04/2017 09/06/2017  HbA1c 4.8 - 5.6 % 6.7(H) 7.4(H) 8.0(H) 7.2(H) -  Microalbumin Not Estab. ug/mL - 7.8(H) - - <3.0(H)  Micro/Creat Ratio 0.0 - 30.0 mg/g creat - 19.9 - - <10.4  Chol 0 - 200 mg/dL 258(H) 167 229(H) 225(H) -  HDL >40 mg/dL 56 47 54 56 -  Calc LDL 0 - 99 mg/dL 162(H) 96 147(H) 122(H) -  Triglycerides <150 mg/dL 202(H) 122 140 236(H) -  Creatinine 0.44 - 1.00 mg/dL 0.68 0.71 0.64 0.73 -   BP/Weight 12/10/2018 12/06/2018 06/19/2018 06/19/2018 06/12/2018 05/29/2018 5/80/9983  Systolic BP 382 505 - 397 673 - 419  Diastolic BP 90 82 - 87 78 - 83  Wt. (Lbs) 180 182 182 182 182 187 190  BMI 32.92 33.29 33.83 33.83 33.29 34.76 35.32   Foot/eye exam completion dates Latest Ref Rng & Units 11/02/2018 03/15/2018  Eye Exam No Retinopathy No Retinopathy -  Foot Form Completion - - Done        Pemphigoid, cicatricial On cellcept chronically, no recent flare, controlled

## 2018-12-16 NOTE — Assessment & Plan Note (Signed)
Unchanged  Patient re-educated about  the importance of commitment to a  minimum of 150 minutes of exercise per week as able.  The importance of healthy food choices with portion control discussed, as well as eating regularly and within a 12 hour window most days. The need to choose "clean , green" food 50 to 75% of the time is discussed, as well as to make water the primary drink and set a goal of 64 ounces water daily.  Encouraged to start a food diary,  and to consider  joining a support group. Sample diet sheets offered. Goals set by the patient for the next several months.   Weight /BMI 12/10/2018 12/06/2018 06/19/2018  WEIGHT 180 lb 182 lb 182 lb  HEIGHT 5\' 2"  5\' 2"  5' 1.5"  BMI 32.92 kg/m2 33.29 kg/m2 33.83 kg/m2

## 2018-12-16 NOTE — Assessment & Plan Note (Signed)
Uncontrolled, pt anxious and rushed at visit, will need to re evaluate this DASH diet and commitment to daily physical activity for a minimum of 30 minutes discussed and encouraged, as a part of hypertension management. The importance of attaining a healthy weight is also discussed.  BP/Weight 12/10/2018 12/06/2018 06/19/2018 06/19/2018 06/12/2018 05/29/2018 1/41/5973  Systolic BP 312 508 - 719 941 - 290  Diastolic BP 90 82 - 87 78 - 83  Wt. (Lbs) 180 182 182 182 182 187 190  BMI 32.92 33.29 33.83 33.83 33.29 34.76 35.32

## 2018-12-16 NOTE — Assessment & Plan Note (Signed)
Hyperlipidemia:Low fat diet discussed and encouraged.   Lipid Panel  Lab Results  Component Value Date   CHOL 258 (H) 11/29/2018   HDL 56 11/29/2018   LDLCALC 162 (H) 11/29/2018   TRIG 202 (H) 11/29/2018   CHOLHDL 4.6 11/29/2018  deteriorated and uncontrolled, reports intolerance to higher dose of livalo, stated that a she would revert to lower dose , however has since been started on Repatha by endo, will reach out to see what she is actually taking

## 2018-12-16 NOTE — Assessment & Plan Note (Signed)
1 dy h/o acute onset right calf pain and swelling with abnormal exam suggestive of acute DVT, size discrepancy, tenderness , warmth and erythema with positive Homan's stat doppler ordered

## 2018-12-16 NOTE — Assessment & Plan Note (Signed)
On cellcept chronically, no recent flare, controlled

## 2018-12-16 NOTE — Patient Instructions (Signed)
F/u June 10 or after with foot exam, please call if you need me sooner  You need to get a doppler study of your right calf today, to ensure no clot in your leg (  Thankful study is negative)  Blood pressure is above goal of 130/80 or less, if this is still too high at your next  Visit, then medication will need to be adjusted  You have already committed to regular exercise, keep this up. Increase fruit and vegetable intake , mainly fresh or frozen and reduce canned/ processed or salty foods  Continuing to intentionally work on weight loss so that you succeed in losing an additional 6  To 10 pounds . This will help with blood pressure lowering significantly.  I will review the results of our cholesterol when you return, I see that you have been started on new medication by Dr Dorris Fetch Intentionally avoiding fatty and fried foods, cheese, butter and oils as well as egg yolk wil help to lower your cholesterol

## 2018-12-17 MED FILL — SHIPPING COST: 1 days supply | Qty: 1 | Fill #1

## 2018-12-17 MED FILL — REPATHA SURECLICK 140 MG/ML: 140 | 28 days supply | Qty: 2 | Fill #0

## 2018-12-31 MED FILL — metFORMIN HCL 1000 MG TABS: 1000 | 90 days supply | Qty: 180 | Fill #1

## 2018-12-31 MED FILL — ACCU-CHEK GUIDE TEST STRIP: 90 days supply | Qty: 200 | Fill #2

## 2018-12-31 MED FILL — CITALOPRAM HBR 20 MG TABLET: 20 | 90 days supply | Qty: 90 | Fill #2

## 2018-12-31 MED FILL — SHIPPING COST: 1 days supply | Qty: 1 | Fill #2

## 2018-12-31 MED FILL — TRULICITY 1.5 MG/0.5 ML PEN: 1.5 | 84 days supply | Qty: 6 | Fill #1

## 2019-01-22 MED FILL — LOSARTAN POTASSIUM 50 MG TA: 50 | 30 days supply | Qty: 15 | Fill #1

## 2019-01-27 IMAGING — CT CT CHEST LUNG CANCER SCREENING LOW DOSE W/O CM
1 series · 10 of 10 positions shown, 13 images · non-contrast
Comparison: 09/12/2016

CLINICAL DATA: 56-year-old female with 40 pack-year history of
smoking. Lung cancer screening.

EXAM:
CT CHEST WITHOUT CONTRAST LOW-DOSE FOR LUNG CANCER SCREENING
TECHNIQUE: Multidetector CT imaging of the chest was performed following the
standard protocol without IV contrast.

[ct lung segmentation data · axial · 0.62mm/px · z∈[+1124,+1124]mm · 10 of 301 frames shown]
[frame 1/301  mediastinal]
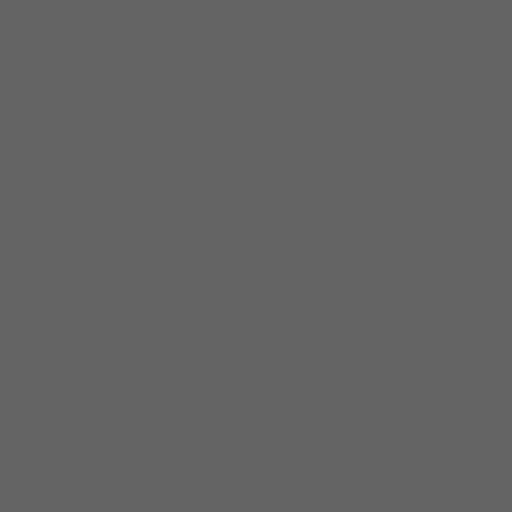
[frame 1/301  lung]
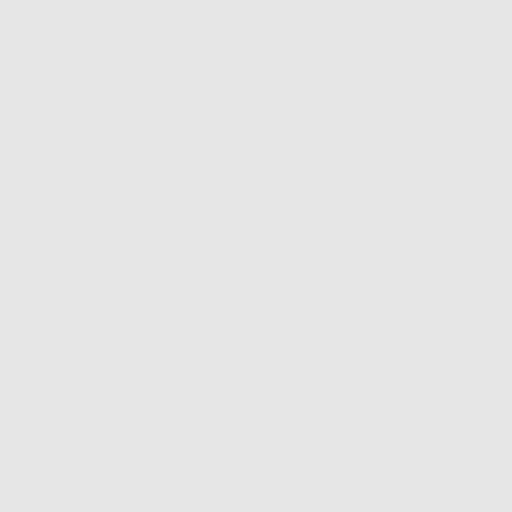
[frame 34/301  lung]
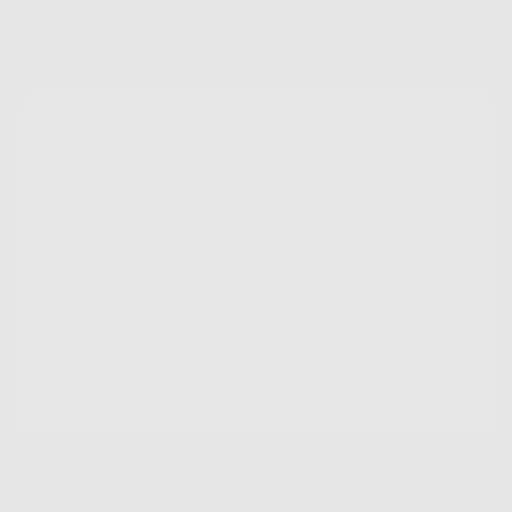
[frame 67/301  lung]
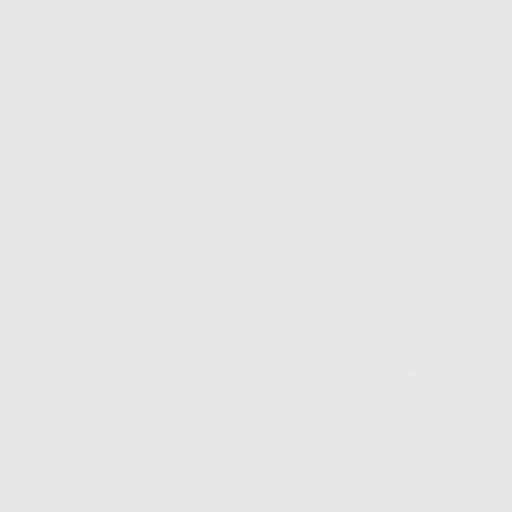
[frame 101/301  lung]
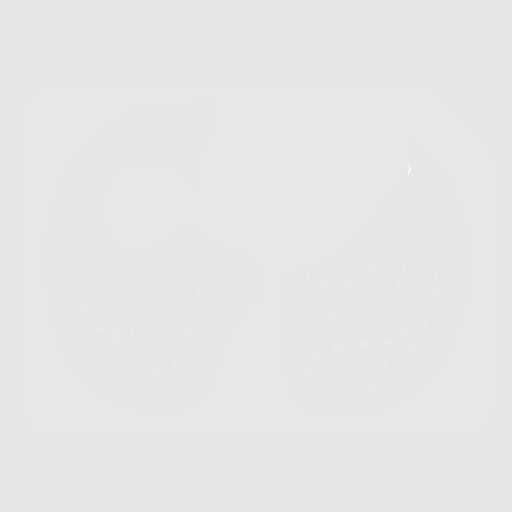
[frame 134/301  mediastinal]
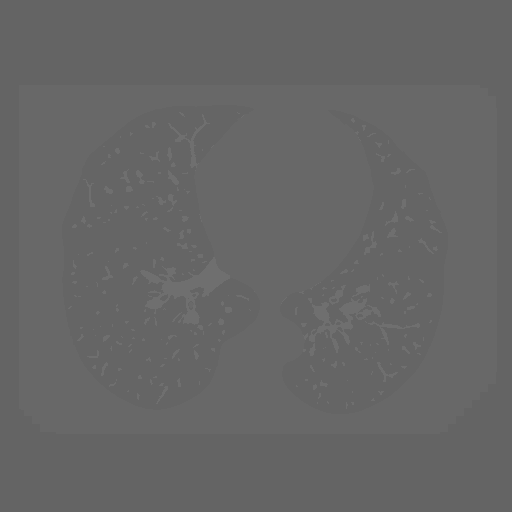
[frame 134/301  lung]
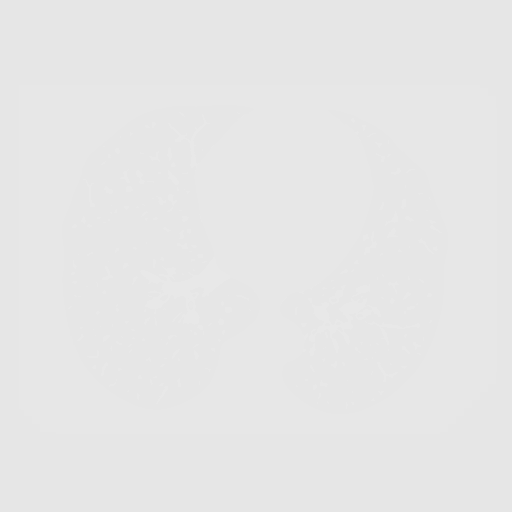
[frame 167/301  lung]
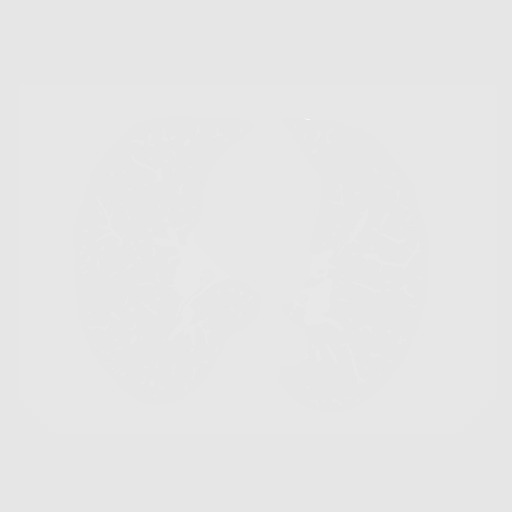
[frame 201/301  lung]
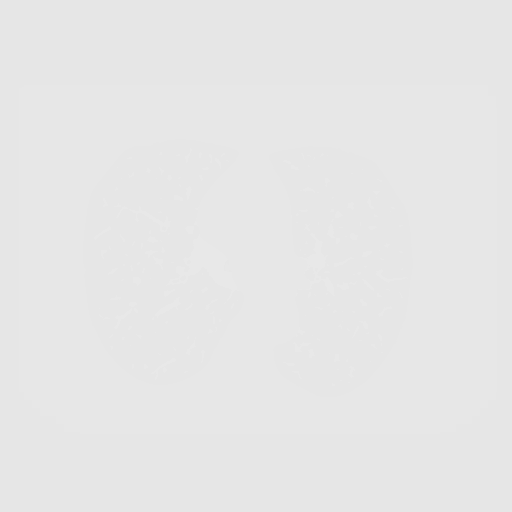
[frame 234/301  lung]
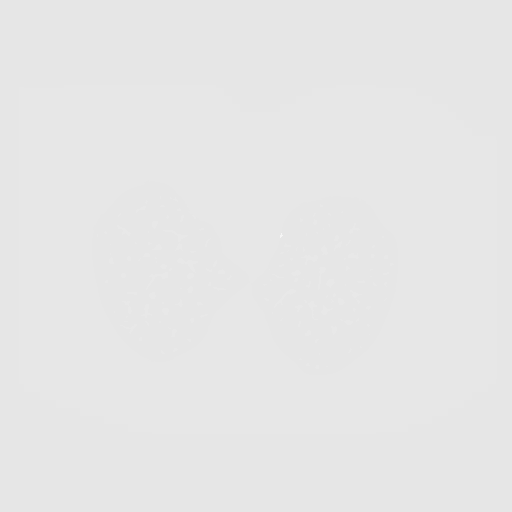
[frame 267/301  mediastinal]
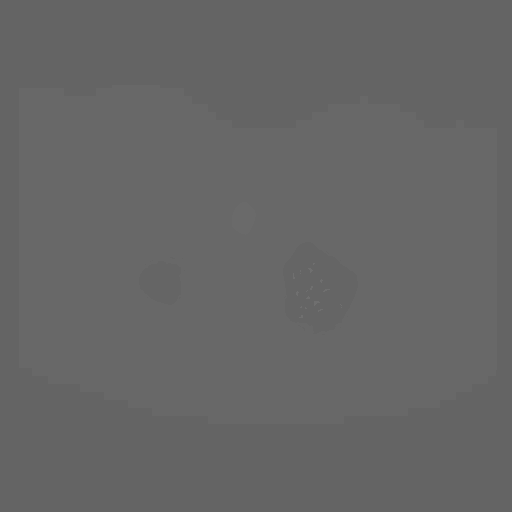
[frame 267/301  lung]
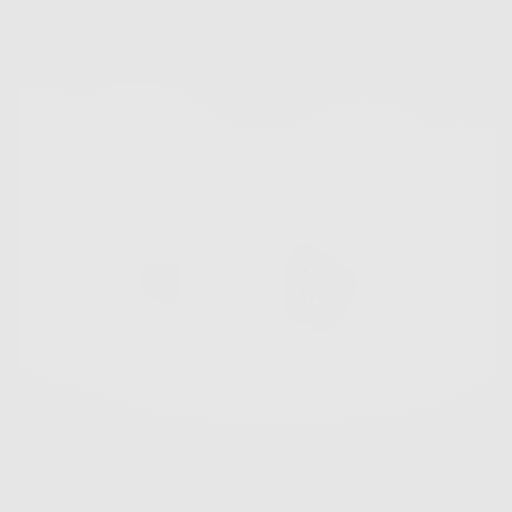
[frame 301/301  lung]
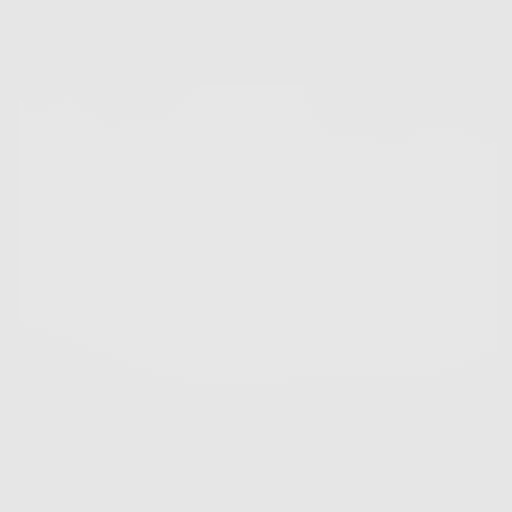

[10 of 10 positions shown; findings below may reference images not displayed]

FINDINGS: Cardiovascular: The heart size is normal. No pericardial effusion.
Coronary artery calcification is evident. Atherosclerotic
calcification is noted in the wall of the thoracic aorta.

Mediastinum/Nodes: No mediastinal lymphadenopathy. No evidence for
gross hilar lymphadenopathy although assessment is limited by the
lack of intravenous contrast on today's study. There is no axillary
lymphadenopathy.

Lungs/Pleura: Tiny bilateral pulmonary nodules are evident,
measuring up to a maximum volume derived mean diameter of 4 mm. No
worrisome pulmonary nodule or mass. No focal airspace consolidation.
No pulmonary edema or pleural effusion.

Upper Abdomen: Stable right adrenal nodules, the largest measuring 2
cm hand not substantially changed since at least 03/03/2014. The
liver shows diffusely decreased attenuation suggesting steatosis.

Musculoskeletal: Unremarkable.
IMPRESSION: 1. Lung-RADS 2, benign appearance or behavior. Continue annual
screening with low-dose chest CT without contrast in 12 months.
2. Coronary artery and Aortic Atherosclerois ([id number]-170.0)
3. Back steatosis.
4. Stable adenoma right adrenal gland.

## 2019-01-29 ENCOUNTER — Telehealth: Payer: Self-pay | Admitting: "Endocrinology

## 2019-01-29 DIAGNOSIS — I10 Essential (primary) hypertension: Secondary | ICD-10-CM

## 2019-01-29 MED ORDER — LOSARTAN POTASSIUM 50 MG PO TABS: 50.0000 mg | ORAL_TABLET | Freq: Every day | ORAL | 1 refills | Status: DC

## 2019-01-29 NOTE — Telephone Encounter (Signed)
Rx sent 

## 2019-02-16 MED FILL — LOSARTAN POTASSIUM 50 MG TA: 50 | 30 days supply | Qty: 15 | Fill #2

## 2019-03-06 MED FILL — MYCOPHENOLATE 500 MG TABLET: 500 | 90 days supply | Qty: 90 | Fill #1

## 2019-03-06 MED FILL — ACCU-CHEK FASTCLIX LANCETS: 90 days supply | Qty: 102 | Fill #2

## 2019-03-16 MED FILL — LOSARTAN POTASSIUM 50 MG TA: 50 | 30 days supply | Qty: 15 | Fill #3

## 2019-03-28 ENCOUNTER — Other Ambulatory Visit: Payer: Self-pay | Admitting: "Endocrinology

## 2019-03-28 MED FILL — TRULICITY 1.5 MG/0.5 ML PEN: 1.5 | 27 days supply | Qty: 2 | Fill #0

## 2019-04-04 ENCOUNTER — Other Ambulatory Visit (HOSPITAL_COMMUNITY)
Admission: RE | Admit: 2019-04-04 | Discharge: 2019-04-04 | Disposition: A | Discharge status: Home / Self Care | Payer: 59 | Source: Ambulatory Visit | Attending: "Endocrinology | Admitting: "Endocrinology

## 2019-04-04 ENCOUNTER — Other Ambulatory Visit: Payer: Self-pay | Admitting: Family Medicine

## 2019-04-04 DIAGNOSIS — E1165 Type 2 diabetes mellitus with hyperglycemia: Secondary | ICD-10-CM | POA: Insufficient documentation

## 2019-04-04 LAB — COMPREHENSIVE METABOLIC PANEL
ALT: 47 U/L — ABNORMAL HIGH (ref 0–44)
AST: 38 U/L (ref 15–41)
Albumin: 4.2 g/dL (ref 3.5–5.0)
Alkaline Phosphatase: 56 U/L (ref 38–126)
Anion gap: 12 (ref 5–15)
BUN: 12 mg/dL (ref 6–20)
CO2: 24 mmol/L (ref 22–32)
Calcium: 9.1 mg/dL (ref 8.9–10.3)
Chloride: 100 mmol/L (ref 98–111)
Creatinine, Ser: 0.61 mg/dL (ref 0.44–1.00)
GFR calc Af Amer: >60 mL/min (ref >60)
GFR calc non Af Amer: >60 mL/min (ref >60)
Glucose, Bld: 155 mg/dL — ABNORMAL HIGH (ref 70–99)
Potassium: 4.3 mmol/L (ref 3.5–5.1)
Sodium: 136 mmol/L (ref 135–145)
Total Bilirubin: 0.9 mg/dL (ref 0.3–1.2)
Total Protein: 7.1 g/dL (ref 6.5–8.1)

## 2019-04-04 LAB — HEMOGLOBIN A1C
Hgb A1c MFr Bld: 7.4 % — ABNORMAL HIGH (ref 4.8–5.6)
Mean Plasma Glucose: 165.68 mg/dL

## 2019-04-04 MED FILL — CITALOPRAM HBR 20 MG TABLET: 20 | 90 days supply | Qty: 90 | Fill #0

## 2019-04-05 LAB — CALCITRIOL (1,25 DI-OH VIT D): Vit D, 1,25-Dihydroxy: 51.6 pg/mL (ref 19.9–79.3)

## 2019-04-09 ENCOUNTER — Other Ambulatory Visit: Payer: Self-pay

## 2019-04-09 MED ORDER — CITALOPRAM HYDROBROMIDE 20 MG PO TABS: 20.0000 mg | ORAL_TABLET | Freq: Every day | ORAL | 1 refills | Status: DC

## 2019-04-10 ENCOUNTER — Encounter: Payer: Self-pay | Admitting: "Endocrinology

## 2019-04-10 ENCOUNTER — Other Ambulatory Visit: Payer: Self-pay | Admitting: "Endocrinology

## 2019-04-10 ENCOUNTER — Encounter: Payer: Self-pay | Admitting: Nutrition

## 2019-04-10 ENCOUNTER — Ambulatory Visit (INDEPENDENT_AMBULATORY_CARE_PROVIDER_SITE_OTHER): Payer: 59 | Admitting: "Endocrinology

## 2019-04-10 ENCOUNTER — Other Ambulatory Visit: Payer: Self-pay

## 2019-04-10 ENCOUNTER — Encounter: Payer: 59 | Attending: Family Medicine | Admitting: Nutrition

## 2019-04-10 DIAGNOSIS — E1165 Type 2 diabetes mellitus with hyperglycemia: Secondary | ICD-10-CM

## 2019-04-10 DIAGNOSIS — E118 Type 2 diabetes mellitus with unspecified complications: Secondary | ICD-10-CM | POA: Insufficient documentation

## 2019-04-10 DIAGNOSIS — E782 Mixed hyperlipidemia: Secondary | ICD-10-CM | POA: Diagnosis not present

## 2019-04-10 DIAGNOSIS — I10 Essential (primary) hypertension: Secondary | ICD-10-CM

## 2019-04-10 DIAGNOSIS — E669 Obesity, unspecified: Secondary | ICD-10-CM | POA: Insufficient documentation

## 2019-04-10 DIAGNOSIS — IMO0002 Reserved for concepts with insufficient information to code with codable children: Secondary | ICD-10-CM

## 2019-04-10 MED ORDER — METFORMIN HCL 1000 MG PO TABS: ORAL_TABLET | ORAL | 1 refills | Status: DC

## 2019-04-10 MED ORDER — DULAGLUTIDE 1.5 MG/0.5ML ~~LOC~~ SOAJ: SUBCUTANEOUS | 1 refills | Status: DC

## 2019-04-10 MED ORDER — LOSARTAN POTASSIUM 50 MG PO TABS: 50.0000 mg | ORAL_TABLET | Freq: Every day | ORAL | 1 refills | Status: DC

## 2019-04-10 MED FILL — metFORMIN HCL 1000 MG TABS: 1000 | 90 days supply | Qty: 180 | Fill #0

## 2019-04-10 MED FILL — LOSARTAN POTASSIUM 50 MG TA: 50 | 30 days supply | Qty: 30 | Fill #0

## 2019-04-10 NOTE — Progress Notes (Signed)
04/10/2019, 8:56 AM                                Endocrinology Telehealth Visit Follow up Note -During COVID -19 Pandemic  I connected with Alicia Neal on 04/10/2019   by telephone and verified that I am speaking with the correct person using two identifiers. Alicia Neal, Sep 11, 1961. she has verbally consented to this visit. All issues noted in this document were discussed and addressed. The format was not optimal for physical exam.    Subjective:    Patient ID: Alicia Neal, female    DOB: [birth year].  Alicia Neal is being seen in follow-up for management of currently uncontrolled symptomatic diabetes requested by  Fayrene Helper, MD.   Past Medical History:  Diagnosis Date  . Adrenal adenoma    right side  . Allergic rhinitis   . Anxiety disorder   . Carpal tunnel syndrome of left wrist   . Chronic depression    Dr. Cheryln Manly  . GERD (gastroesophageal reflux disease)   . History of abnormal cervical Pap smear   . History of adenomatous polyp of colon   . History of hiatal hernia   . Hyperlipidemia   . Hypertension   . IBS (irritable bowel syndrome)   . OSA (obstructive sleep apnea)    per pt study yrs ago-- CPAP intolerant   . Pemphigoid, cicatricial    pruritic autoimmune blistering skin disorder  . PONV (postoperative nausea and vomiting)   . Pulmonary nodule, right pulmologist-  dr Nathaneil Canary mcquaid   x2    per CT 09-11-2015  . Trigger finger, left    ring finger  . Type 2 diabetes mellitus (Alicia)   . Wears glasses    Past Surgical History:  Procedure Laterality Date  . CARDIOVASCULAR STRESS TEST  04-30-2003   Small focus of decreased perfusion on rest images in the mid-distal anterior wall (worrisome for ischemia),  normal LV function and wall motion , ef 60%  . CARPAL TUNNEL RELEASE Left 11/26/2015   Procedure: CARPAL TUNNEL RELEASE;  Surgeon: Iran Planas, MD;  Location: [facility];  Service: Orthopedics;  Laterality:  Left;  . CHOLECYSTECTOMY  1989  . TRANSTHORACIC ECHOCARDIOGRAM  04-30-2003   mild hypokinetic in the inferior and posterior wall, ef 50%/  trivial MR /  mild TR  . TRIGGER FINGER RELEASE Left 11/26/2015   Procedure: RELEASE TRIGGER FINGER/A-1 PULLEY LEFT RING  FINGER;  Surgeon: Iran Planas, MD;  Location: [facility];  Service: Orthopedics;  Laterality: Left;  . TUBAL LIGATION  2000   Social History   Socioeconomic History  . Marital status: Single    Spouse name: Not on file  . Number of children: 1  . Years of education: Not on file  . Highest education level: Not on file  Occupational History  . Not on file  Social Needs  . Financial resource strain: Not on file  . Food insecurity:    Worry: Not on file    Inability: Not on file  . Transportation needs:    Medical: Not on file    Non-medical: Not on file  Tobacco Use  . Smoking status: Former Smoker    Packs/day: 1.00    Years: 40.00    Pack years: 40.00    Types: Cigarettes    Last attempt to quit:  11/07/2013    Years since quitting: 5.4  . Smokeless tobacco: Never Used  Substance and Sexual Activity  . Alcohol use: Yes    Alcohol/week: 0.0 standard drinks    Comment: occasional  . Drug use: No  . Sexual activity: Not Currently    Birth control/protection: Post-menopausal  Lifestyle  . Physical activity:    Days per week: Not on file    Minutes per session: Not on file  . Stress: Not on file  Relationships  . Social connections:    Talks on phone: Not on file    Gets together: Not on file    Attends religious service: Not on file    Active member of club or organization: Not on file    Attends meetings of clubs or organizations: Not on file    Relationship status: Not on file  Other Topics Concern  . Not on file  Social History Narrative  . Not on file   Outpatient Encounter Medications as of 04/10/2019  Medication Sig  . ACCU-CHEK FASTCLIX LANCETS MISC USE TO CHECK BLOOD SUGAR ONCE A DAY (DX E11.9)  .  Cholecalciferol (VITAMIN D3) 125 MCG (5000 UT) CAPS Take 1 capsule (5,000 Units total) by mouth daily.  . citalopram (CELEXA) 20 MG tablet Take 1 tablet (20 mg total) by mouth daily.  . Dulaglutide (TRULICITY) 1.5 JH/4.1DE SOPN INJECT 1.5 MG INTO THE SKIN ONCE A WEEK.  . Evolocumab (REPATHA SURECLICK) 081 MG/ML SOAJ Inject 140 mg into the skin every 14 (fourteen) days.  . fluticasone (FLONASE) 50 MCG/ACT nasal spray Place 2 sprays into both nostrils daily. (Patient taking differently: Place 2 sprays into both nostrils daily as needed for allergies. )  . glucose blood (ACCU-CHEK GUIDE) test strip USE TO CHECK BLOOD SUGAR twice daily  (DX E11.9)  . losartan (COZAAR) 50 MG tablet Take 1 tablet (50 mg total) by mouth daily.  . metFORMIN (GLUCOPHAGE) 1000 MG tablet TAKE 1 TABLET (1,000 MG TOTAL) BY MOUTH 2 TIMES DAILY WITH A MEAL.  . mycophenolate (CELLCEPT) 500 MG tablet Take 500 mg by mouth daily.   . pantoprazole (PROTONIX) 40 MG tablet Take 1 tablet (40 mg total) by mouth daily.  . Pitavastatin Calcium (LIVALO) 2 MG TABS Take 2 mg by mouth daily.  . [DISCONTINUED] losartan (COZAAR) 50 MG tablet Take 1 tablet (50 mg total) by mouth daily.  . [DISCONTINUED] metFORMIN (GLUCOPHAGE) 1000 MG tablet TAKE 1 TABLET (1,000 MG TOTAL) BY MOUTH 2 TIMES DAILY WITH A MEAL.  . [DISCONTINUED] TRULICITY 1.5 KG/8.1EH SOPN INJECT 1.5 MG INTO THE SKIN ONCE A WEEK.   No facility-administered encounter medications on file as of 04/10/2019.     ALLERGIES: Allergies  Allergen Reactions  . Ezetimibe-Simvastatin Other (See Comments)    REACTION: muscle aches  . Pravachol [Pravastatin Sodium] Other (See Comments)    Joint pains and memory loss  . Simvastatin Other (See Comments)    REACTION: muscle aches  . Zetia [Ezetimibe] Other (See Comments)    Generalized joint pains    VACCINATION STATUS: Immunization History  Administered Date(s) Administered  . Influenza Split 07/19/2013, 07/22/2014  . Pneumococcal  Conjugate-13 06/25/2014  . Tdap 06/25/2014  . Zoster Recombinat (Shingrix) 06/12/2018    Diabetes  She presents for her follow-up diabetic visit. She has type 2 diabetes mellitus. Onset time: She was diagnosed at approximate age of 59 years. Her disease course has been worsening (She reports A1c of 13% at the time of diagnosis, with subsequent improvement  to near target range and recent reverse to 8% in May 2019.). Pertinent negatives for hypoglycemia include no confusion, headaches, hunger, pallor or seizures. Pertinent negatives for diabetes include no chest pain, no fatigue, no polydipsia, no polyphagia and no polyuria. There are no hypoglycemic complications. Symptoms are worsening. There are no diabetic complications. Risk factors for coronary artery disease include diabetes mellitus, dyslipidemia, hypertension, obesity, tobacco exposure and post-menopausal. Current diabetic treatments: She is currently on glipizide 5 mg p.o. twice daily and Janumet 50/1000 mg p.o. twice daily. Her weight is decreasing steadily. She is following a generally unhealthy diet. When asked about meal planning, she reported none. She has not had a previous visit with a dietitian. She participates in exercise intermittently. An ACE inhibitor/angiotensin II receptor blocker is being taken. Eye exam is current.  Hyperlipidemia  This is a chronic problem. The current episode started more than 1 year ago. The problem is uncontrolled. Recent lipid tests were reviewed and are high. Exacerbating diseases include diabetes. Pertinent negatives include no chest pain, myalgias or shortness of breath. Treatments tried: Patient is not tolerating statins including Livalo. Risk factors for coronary artery disease include dyslipidemia, diabetes mellitus, hypertension and post-menopausal.  Hypertension  This is a chronic problem. The current episode started more than 1 year ago. The problem is uncontrolled. Pertinent negatives include no  chest pain, headaches, palpitations or shortness of breath. Risk factors for coronary artery disease include dyslipidemia, diabetes mellitus and smoking/tobacco exposure. Past treatments include angiotensin blockers.     Review of Systems  Constitutional: Negative for chills, fatigue, fever and unexpected weight change.  HENT: Negative for trouble swallowing and voice change.   Eyes: Negative for visual disturbance.  Respiratory: Negative for cough, shortness of breath and wheezing.   Cardiovascular: Negative for chest pain, palpitations and leg swelling.  Gastrointestinal: Negative for diarrhea, nausea and vomiting.  Endocrine: Negative for cold intolerance, heat intolerance, polydipsia, polyphagia and polyuria.  Musculoskeletal: Negative for arthralgias and myalgias.  Skin: Negative for color change, pallor, rash and wound.  Neurological: Negative for seizures and headaches.  Psychiatric/Behavioral: Negative for confusion and suicidal ideas.    Objective:    There were no vitals taken for this visit.  Wt Readings from Last 3 Encounters:  12/10/18 180 lb (81.6 kg)  12/06/18 182 lb (82.6 kg)  06/19/18 182 lb (82.6 kg)     CMP ( most recent) CMP     Component Value Date/Time   NA 136 04/04/2019 0953   K 4.3 04/04/2019 0953   CL 100 04/04/2019 0953   CO2 24 04/04/2019 0953   GLUCOSE 155 (H) 04/04/2019 0953   BUN 12 04/04/2019 0953   CREATININE 0.61 04/04/2019 0953   CREATININE 0.84 11/18/2016 0844   CALCIUM 9.1 04/04/2019 0953   PROT 7.1 04/04/2019 0953   ALBUMIN 4.2 04/04/2019 0953   AST 38 04/04/2019 0953   ALT 47 (H) 04/04/2019 0953   ALKPHOS 56 04/04/2019 0953   BILITOT 0.9 04/04/2019 0953   GFRNONAA >60 04/04/2019 0953   GFRNONAA 77 11/18/2016 0844   GFRAA >60 04/04/2019 0953   GFRAA 89 11/18/2016 0844     Diabetic Labs (most recent): Lab Results  Component Value Date   HGBA1C 7.4 (H) 04/04/2019   HGBA1C 6.7 (H) 11/29/2018   HGBA1C 7.4 (H) 06/08/2018      Lipid Panel ( most recent) Lipid Panel     Component Value Date/Time   CHOL 258 (H) 11/29/2018 0830   TRIG 202 (H) 11/29/2018 0830  HDL 56 11/29/2018 0830   CHOLHDL 4.6 11/29/2018 0830   VLDL 40 11/29/2018 0830   LDLCALC 162 (H) 11/29/2018 0830      Lab Results  Component Value Date   TSH 1.601 06/08/2018   TSH 1.724 06/08/2017   TSH 1.620 06/08/2015   TSH 1.739 06/23/2014   TSH 1.659 04/15/2013   TSH 1.634 01/11/2012   TSH 1.344 11/04/2010   TSH 1.349 10/16/2009   TSH 2.028 01/25/2008   TSH 1.863 08/23/2006   FREET4 0.92 06/08/2018      Assessment & Plan:   1. Uncontrolled type 2 diabetes mellitus with hyperglycemia ([place])  - Alicia Neal has currently uncontrolled symptomatic type 2 DM since 58 years of age. -Previsit labs show A1c of 7.4% slightly increasing from 6.7% during her last visit.     -She does not report any gross complications from her diabetes, howeverMonique H Neal remains at a high risk for acute and chronic complications of type 2 diabetes which include CAD, CVA, CKD, retinopathy, and neuropathy. These are all discussed in l with the patient.  - I have counseled her on diet management and weight loss, by adopting a carbohydrate restricted/protein rich diet.  - Patient admits there is a room for improvement in her diet and drink choices. -  Suggestion is made for her to avoid simple carbohydrates  from her diet including Cakes, Sweet Desserts / Pastries, Ice Cream, Soda (diet and regular), Sweet Tea, Candies, Chips, Cookies, Store Bought Juices, Alcohol in Excess of  1-2 drinks a day, Artificial Sweeteners, and "Sugar-free" Products. This will help patient to have stable blood glucose profile and potentially avoid unintended weight gain.   - I encouraged her to switch to  unprocessed or minimally processed complex starch and increased protein intake (animal or plant source), fruits, and vegetables.  - she is advised to stick to a routine  mealtimes to eat 3 meals  a day and avoid unnecessary snacks ( to snack only to correct hypoglycemia).   - she is scheduled  with Jearld Fenton, RDN, CDE for individualized diabetes education.  - I have approached her with the following individualized plan to manage diabetes and patient agrees:   -Need insulin treatment for now.  She is advised to continue metformin 1000 mg p.o. twice daily, and Trulicity 1.5 mg subcutaneously weekly.   - Patient specific target  A1c;  LDL, HDL, Triglycerides, and  Waist Circumference were discussed in detail.  2) BP/HTN: Her blood pressure is not controlled to target.  She has mild microalbuminuria.  He is advised to increase her losartan to 50 mg p.o. daily.  3) Lipids/HPL:   Her recent lipid panel showed significantly increased LDL to 162 from 96.  She has not taken her Repatha even though she filled the rx, she will consider retrying.  She is advised to continue the low 2 mg p.o. nightly.  4)  Weight/Diet: She reports some weight gain, CDE Consult has been  initiated , exercise, and detailed carbohydrates information provided.  5) Chronic Care/Health Maintenance:  -she  is on ACEI/ARB and Statin medications and  is encouraged to continue to follow up with Ophthalmology, Dentist,  Podiatrist at least yearly or according to recommendations, and advised to  stay away from smoking. I have recommended yearly flu vaccine and pneumonia vaccination at least every 5 years; moderate intensity exercise for up to 150 minutes weekly; and  sleep for at least 7 hours a day.  - I advised patient  to maintain close follow up with Fayrene Helper, MD for primary care needs.  -- Patient Care Time Today:  25 min, of which >50% was spent in reviewing her  current and  previous labs/studies, previous treatments, and medications doses and developing a plan for long-term care based on the latest recommendations for standards of care.  Alicia Neal participated in the  discussions, expressed understanding, and voiced agreement with the above plans.  All questions were answered to her satisfaction. she is encouraged to contact clinic should she have any questions or concerns prior to her return visit.    Follow up plan: - Return in about 6 months (around 10/10/2019) for Follow up with Pre-visit Labs.  Glade Lloyd, MD [facility] [address] [place], [place] [address] Phone: [phone]  Fax: [phone]    04/10/2019, 8:56 AM  This note was partially dictated with voice recognition software. Similar sounding words can be transcribed inadequately or may not  be corrected upon review.

## 2019-04-10 NOTE — Progress Notes (Addendum)
  Medical Nutrition Therapy:  Appt start time: 930 end time 1000.   Assessment:  Primary concerns today: Diabetes Type 2 Feels like she had fallen off the wagon and slipped back into some old habits. Back to eating snacks after supper, eating later meals, and eating more carbs. She is still walking 45 minutes 4 days per week. .  Truclicity and Metformin 1000 mg/dl.  . Sees Dr. Dorris Fetch, Endocrinology. Walks daily 45 minutes and enjoys it. Engaged to make get back on track and make changes with eating habits, portions and better carb management. A1C up to 7.4%.  Lab Results  Component Value Date   HGBA1C 7.4 (H) 04/04/2019   CMP Latest Ref Rng & Units 04/04/2019 11/29/2018 06/08/2018  Glucose 70 - 99 mg/dL 155(H) 111(H) 131(H)  BUN 6 - 20 mg/dL 12 16 14   Creatinine 0.44 - 1.00 mg/dL 0.61 0.68 0.71  Sodium 135 - 145 mmol/L 136 135 136  Potassium 3.5 - 5.1 mmol/L 4.3 4.2 4.1  Chloride 98 - 111 mmol/L 100 98 101  CO2 22 - 32 mmol/L 24 26 27   Calcium 8.9 - 10.3 mg/dL 9.1 9.6 9.0  Total Protein 6.5 - 8.1 g/dL 7.1 7.6 7.5  Total Bilirubin 0.3 - 1.2 mg/dL 0.9 0.9 0.9  Alkaline Phos 38 - 126 U/L 56 50 52  AST 15 - 41 U/L 38 21 26  ALT 0 - 44 U/L 47(H) 28 34   Lipid Panel     Component Value Date/Time   CHOL 258 (H) 11/29/2018 0830   TRIG 202 (H) 11/29/2018 0830   HDL 56 11/29/2018 0830   CHOLHDL 4.6 11/29/2018 0830   VLDL 40 11/29/2018 0830   LDLCALC 162 (H) 11/29/2018 0830     Preferred Learning Style:    No preference indicated   Learning Readiness:    Ready  Change in progress   MEDICATIONS: see list   DIETARY INTAKE:  24-hr recall:  B ( AM):rasin bran with milk, .  Snk ( AM): water L ( PM):sandwich with chips, water  Snk ( PM): none D ( PM): Take out here lately, Snk ( PM): misc junk food or sweets. Beverages: water, unsweet tea,   Usual physical activity: Walks 45 minutes daily 4-days per week.  Estimated energy needs: 1200-1500  calories 135 g  carbohydrates 90 g protein 33 g fat  Progress Towards Goal(s):  In progress.   Nutritional Diagnosis:  NB-1.1 Food and nutrition-related knowledge deficit As related to Diabetes Type 2..  As evidenced by A1C 8%..    Intervention:  Nutrition and Diabetes education provided on My Plate, CHO counting, meal planning, portion sizes, timing of meals, avoiding snacks between meals unless having a low blood sugar, target ranges for A1C and blood sugars, signs/symptoms and treatment of hyper/hypoglycemia, monitoring blood sugars, taking medications as prescribed, benefits of exercising 30 minutes per day and prevention of complications of DM.  Teaching Method Utilized:  Visual Auditory  Goals Get back to prepping and plan meals Cut out snacks unless protein or veggies Increase exercise to 60 minutes 4 days per week Get A1C back to 7% or less Increase protein with all meals Limit carbs to 30 grams per meal.  Hands on  Handouts given during visit include:  The Plate Method  Meal Plan Card   Barriers to learning/adherence to lifestyle change: none  Demonstrated degree of understanding via:  Teach Back   Monitoring/Evaluation:  Dietary intake, exercise, meal planning, and body weight  6 month(s).

## 2019-04-10 NOTE — Patient Instructions (Addendum)
Goals Get back to prepping and plan meals Cut out snacks unless protein or veggies Increase exercise to 60 minutes 4 days per week Get A1C back to 7% or less Increase protein with all meals Limit carbs to 30 grams per meal.

## 2019-04-15 ENCOUNTER — Telehealth: Payer: Self-pay | Admitting: *Deleted

## 2019-04-15 NOTE — Telephone Encounter (Signed)
Left message letting pt know to call office tomorrow and check in will be done over the phone. The nurse will call when ready for pt to come in the building. Wear a mask to appt. If she has come in contact with someone in the last month that has been confirmed or suspected of having Covid-19, reschedule appt OR if she is experiencing symptoms herself, reschedule appt. [place]

## 2019-04-16 ENCOUNTER — Other Ambulatory Visit: Payer: 59 | Admitting: Women's Health

## 2019-04-17 ENCOUNTER — Encounter: Payer: Self-pay | Admitting: Family Medicine

## 2019-04-17 ENCOUNTER — Other Ambulatory Visit: Payer: Self-pay

## 2019-04-17 ENCOUNTER — Ambulatory Visit (INDEPENDENT_AMBULATORY_CARE_PROVIDER_SITE_OTHER): Payer: 59 | Admitting: Family Medicine

## 2019-04-17 VITALS — Ht 61.5 in | Wt 184.0 lb

## 2019-04-17 DIAGNOSIS — E669 Obesity, unspecified: Secondary | ICD-10-CM | POA: Diagnosis not present

## 2019-04-17 DIAGNOSIS — F419 Anxiety disorder, unspecified: Secondary | ICD-10-CM | POA: Diagnosis not present

## 2019-04-17 MED ORDER — HYDROXYZINE PAMOATE 25 MG PO CAPS: 25.0000 mg | ORAL_CAPSULE | Freq: Every evening | ORAL | 0 refills | Status: DC | PRN

## 2019-04-17 MED FILL — HYDROXYZINE PAMOATE 25 MG C: 25 | 30 days supply | Qty: 30 | Fill #0

## 2019-04-17 NOTE — Progress Notes (Signed)
Virtual Visit via Telephone Note   This visit type was conducted due to national recommendations for restrictions regarding the COVID-19 Pandemic (e.g. social distancing) in an effort to limit this patient's exposure and mitigate transmission in our community.  Due to her co-morbid illnesses, this patient is at least at moderate risk for complications without adequate follow up.  This format is felt to be most appropriate for this patient at this time.  The patient did not have access to video technology/had technical difficulties with video requiring transitioning to audio format only (telephone).  All issues noted in this document were discussed and addressed.  No physical exam could be performed with this format.    Evaluation Performed:  Follow-up visit  Date:  04/17/2019   ID:  Alicia, Neal August 14, 1961, MRN [id number]  Patient Location: Home Provider Location: Other:  telemedicine  Location of Patient: Home Location of Provider: Telehealth Consent was obtain for visit to be over via telehealth. I verified that I am speaking with the correct person using two identifiers.  PCP:  Alicia Helper, MD   Chief Complaint:  Follow up on anxiety and weight  History of Present Illness:    Alicia Neal is a 58 y.o. female patient of Dr. Griffin Dakin.  Presents today for follow-up on weight and has concerns for anxiety.  Reports that she has not been eating the best diet.  Has been eating a lot of sweets and things that are more comfort food as she has been more stressed during the pandemic.  Additionally she has concerns that she is anxiety in the middle the night was just occasional at the beginning of March.  But has increased to about 2-3 times a week.  Onset was during the start of COVID back in March.  She is unsure if it is related but it possibly could be.  She feels like she has to tap her foot and move around and she feels very anxious when she is trying to fall back  asleep.  Has a hard time falling back asleep if she is able to get back to sleep.  Reports that she tried taking BuSpar that was given to her after her mother's passing.  Without relief.  Overall though she is feeling well and doing okay.  She denies having any signs or symptoms of fever, chills, cough, shortness of breath or other signs and symptoms of infection.  She denies having vision changes, dizziness, headaches, chest pain, chest tightness.  Or leg swelling or palpitations.  The patient does not have symptoms concerning for COVID-19 infection (fever, chills, cough, or new shortness of breath).   Past Medical, Surgical, Social History, Allergies, and Medications have been Reviewed.   Past Medical History:  Diagnosis Date  . Adrenal adenoma    right side  . Allergic rhinitis   . Anxiety disorder   . Carpal tunnel syndrome of left wrist   . Chronic depression    Dr. Cheryln Manly  . GERD (gastroesophageal reflux disease)   . History of abnormal cervical Pap smear   . History of adenomatous polyp of colon   . History of hiatal hernia   . Hyperlipidemia   . Hypertension   . IBS (irritable bowel syndrome)   . OSA (obstructive sleep apnea)    per pt study yrs ago-- CPAP intolerant   . Pemphigoid, cicatricial    pruritic autoimmune blistering skin disorder  . PONV (postoperative nausea and vomiting)   .  Pulmonary nodule, right pulmologist-  dr Alicia Neal   x2    per CT 09-11-2015  . Trigger finger, left    ring finger  . Type 2 diabetes mellitus ([place])   . Wears glasses    Past Surgical History:  Procedure Laterality Date  . CARDIOVASCULAR STRESS TEST  04-30-2003   Small focus of decreased perfusion on rest images in the mid-distal anterior wall (worrisome for ischemia),  normal LV function and wall motion , ef 60%  . CARPAL TUNNEL RELEASE Left 11/26/2015   Procedure: CARPAL TUNNEL RELEASE;  Surgeon: Iran Planas, MD;  Location: [facility];  Service: Orthopedics;  Laterality:  Left;  . CHOLECYSTECTOMY  1989  . TRANSTHORACIC ECHOCARDIOGRAM  04-30-2003   mild hypokinetic in the inferior and posterior wall, ef 50%/  trivial MR /  mild TR  . TRIGGER FINGER RELEASE Left 11/26/2015   Procedure: RELEASE TRIGGER FINGER/A-1 PULLEY LEFT RING  FINGER;  Surgeon: Iran Planas, MD;  Location: [facility];  Service: Orthopedics;  Laterality: Left;  . TUBAL LIGATION  2000     Current Meds  Medication Sig  . ACCU-CHEK FASTCLIX LANCETS MISC USE TO CHECK BLOOD SUGAR ONCE A DAY (DX E11.9)  . Cholecalciferol (VITAMIN D3) 125 MCG (5000 UT) CAPS Take 1 capsule (5,000 Units total) by mouth daily.  . citalopram (CELEXA) 20 MG tablet Take 1 tablet (20 mg total) by mouth daily.  . Dulaglutide (TRULICITY) 1.5 GL/8.7FI SOPN INJECT 1.5 MG INTO THE SKIN ONCE A WEEK.  . Evolocumab (REPATHA SURECLICK) 433 MG/ML SOAJ Inject 140 mg into the skin every 14 (fourteen) days.  . fluticasone (FLONASE) 50 MCG/ACT nasal spray Place 2 sprays into both nostrils daily. (Patient taking differently: Place 2 sprays into both nostrils daily as needed for allergies. )  . glucose blood (ACCU-CHEK GUIDE) test strip USE TO CHECK BLOOD SUGAR twice daily  (DX E11.9)  . losartan (COZAAR) 50 MG tablet Take 1 tablet (50 mg total) by mouth daily.  . metFORMIN (GLUCOPHAGE) 1000 MG tablet TAKE 1 TABLET (1,000 MG TOTAL) BY MOUTH 2 TIMES DAILY WITH A MEAL.  . mycophenolate (CELLCEPT) 500 MG tablet Take 500 mg by mouth daily.   . pantoprazole (PROTONIX) 40 MG tablet Take 1 tablet (40 mg total) by mouth daily.     Allergies:   Ezetimibe-simvastatin; Pravachol [pravastatin sodium]; Simvastatin; and Zetia [ezetimibe]   Social History   Tobacco Use  . Smoking status: Former Smoker    Packs/day: 1.00    Years: 40.00    Pack years: 40.00    Types: Cigarettes    Last attempt to quit: 11/07/2013    Years since quitting: 5.4  . Smokeless tobacco: Never Used  Substance Use Topics  . Alcohol use: Yes    Alcohol/week: 0.0 standard  drinks    Comment: occasional  . Drug use: No     Family Hx: The patient's family history includes Arthritis in her mother; COPD in her maternal grandfather; Cancer in her brother, father, maternal grandfather, and maternal grandmother; Diabetes in her father; Hypertension in her paternal grandmother; Lung cancer in her father; Stroke in her paternal grandmother.  ROS:   Please see the history of present illness.    Review of Systems  Constitutional: Negative for activity change, appetite change, chills and fever.  HENT: Negative.   Eyes: Negative for visual disturbance.  Respiratory: Negative for cough, chest tightness and shortness of breath.   Cardiovascular: Negative for chest pain, palpitations and leg swelling.  Gastrointestinal:  Negative.   Endocrine: Negative.   Genitourinary: Negative.   Musculoskeletal: Negative.   Skin: Negative.   Allergic/Immunologic: Negative.   Neurological: Negative for dizziness and headaches.  Hematological: Negative.   Psychiatric/Behavioral: The patient is nervous/anxious.   All other systems reviewed and are negative.   Labs/Other Tests and Data Reviewed:    Recent Labs: 06/08/2018: TSH 1.601 11/29/2018: Hemoglobin 14.2; Platelets 367 04/04/2019: ALT 47; BUN 12; Creatinine, Ser 0.61; Potassium 4.3; Sodium 136   Recent Lipid Panel Lab Results  Component Value Date/Time   CHOL 258 (H) 11/29/2018 08:30 AM   TRIG 202 (H) 11/29/2018 08:30 AM   HDL 56 11/29/2018 08:30 AM   CHOLHDL 4.6 11/29/2018 08:30 AM   LDLCALC 162 (H) 11/29/2018 08:30 AM    Wt Readings from Last 3 Encounters:  04/17/19 184 lb (83.5 kg)  12/10/18 180 lb (81.6 kg)  12/06/18 182 lb (82.6 kg)     Objective:    Vital Signs:  Ht 5' 1.5" (1.562 m)   Wt 184 lb (83.5 kg)   BMI 34.20 kg/m    GEN:  alert and oriented RESPIRATORY:  no shortness of breath noted in conversation PSYCH:  normal affect, mood, and good communication  ASSESSMENT & PLAN:    1. Anxiety  Uncontrolled, will be starting her on Vistaril as she is waking up with anxiety and unable to fall back asleep.  This is been going on since about March.  Progressively has gotten worse with the pandemic and the stress of work.  Advised of the need to take Vistaril and not operate heavy machinery or drive.  And to allow for at least 8 hours of good sleep.  As it has the effects of Benadryl.  Patient verbalized understanding of this.  Further encouraged patient to look into employee assistance program that is offered through the [facility] health system.  In addition to providing her with information about sleep regimes and routines.  And overall information about how to promote healthy sleep.  And self-care.  - hydrOXYzine (VISTARIL) 25 MG capsule; Take 1 capsule (25 mg total) by mouth at bedtime as needed for anxiety.  Dispense: 30 capsule; Refill: 0  2. Obesity (BMI 30.0-34.9) Uncontrolled, reports that she has not been eating the best.  But is going to be trying to get herself back on a healthy well-balanced diet.  Encouraged her to try to do 30 minutes of walking as this would also help with her self-care needs as well. She understands that her weight impacts her blood pressure and her diabetes as well as her overall health.    Time:   Today, I have spent 10 minutes with the patient with telehealth technology discussing the above problems.    From [facility] Medication Adjustments/Labs and Tests Ordered: Current medicines are reviewed at length with the patient today.  Concerns regarding medicines are outlined above.   Tests Ordered: No orders of the defined types were placed in this encounter.   Medication Changes: Meds ordered this encounter  Medications  . hydrOXYzine (VISTARIL) 25 MG capsule    Sig: Take 1 capsule (25 mg total) by mouth at bedtime as needed for anxiety.    Dispense:  30 capsule    Refill:  0    Order Specific Question:   Supervising Provider    Answer:   Tula Nakayama E [[id number]]    Disposition:  Follow up 3 weeks  Signed, [name], NP  04/17/2019 2:58 PM  [facility]

## 2019-04-17 NOTE — Patient Instructions (Addendum)
Thank you for completing your visit via telephone. I appreciate the opportunity to provide you with the care for your health and wellness. Today we discussed: anxiety and self care  Follow up in 3 weeks for anxiety. Can cancel appt if feeling better.  I have sent in your medication for you to try at night. Please try a half dose on the first night, as that might be enough. You need to allow sleep time. This medications is a lot like benadryl in the sleepiness it causes. So give yourself 8 hours of bedtime.  Do not operate heavy machinery and or drive after taking this medication.  Please review information for the Employee Assistance Program. I have heard wonderful things about them.  Please try to get your diet back on track so that you are taking care of yourself. As self care is important.   As I talked to you about, meditation, yoga or gentle stretching before bed, along with a sleep routine is very important for relaxing a anxious mind. There are also apps that can guide you with breathing to help relax.  I have attached some information on sleep for your to review as well.   Additionally, please try and get 30 minutes of walking time or exercise on at least 5 days of the week. In addition to eating a well balanced diet.  Please continue to practice social distancing during this time to keep you, your family, and our community safe.   [place] YOUR HANDS WELL AND FREQUENTLY. AVOID TOUCHING YOUR FACE, UNLESS YOUR HANDS ARE FRESHLY WASHED.  GET FRESH AIR DAILY. STAY HYDRATED WITH WATER.   It was a pleasure to see you and I look forward to continuing to work together on your health and well-being. Please do not hesitate to call the office if you need care or have questions about your care.  Have a wonderful day and week. With Gratitude, Cherly Beach, DNP, AGNP-BC  Teens need about 9 hours of sleep a night. Younger children need more sleep (10-11 hours a night) and adults need  slightly less (7-9 hours each night). 11 Tips to Follow: 1. No caffeine after 3pm: Avoid beverages with caffeine (soda, tea, energy drinks, etc.) especially after 3pm.  2. Don't go to bed hungry: Have your evening meal at least 3 hrs. before going to sleep. It's fine to have a small bedtime snack such as a glass of milk and a few crackers but don't have a big meal.  3. Have a nightly routine before bed: Plan on "winding down" before you go to sleep. Begin relaxing about 1 hour before you go to bed. Try doing a quiet activity such as listening to calming music, reading a book or meditating.  4. Turn off the TV and ALL electronics including video games, tablets, laptops, etc. 1 hour before sleep, and keep them out of the bedroom.  5. Turn off your cell phone and all notifications (new email and text alerts) or even better, leave your phone outside your room while you sleep. Studies have shown that a part of your brain continues to respond to certain lights and sounds even while you're still asleep.  6. Make your bedroom quiet, dark and cool. If you can't control the noise, try wearing earplugs or using a fan to block out other sounds.  7. Practice relaxation techniques. Try reading a book or meditating or drain your brain by writing a list of what you need to do the next  day.  8. Don't nap unless you feel sick: you'll have a better night's sleep.  9. Don't smoke, or quit if you do. Nicotine, alcohol, and marijuana can all keep you awake. Talk to your health care provider if you need help with substance use.  10. Most importantly, wake up at the same time every day (or within 1 hour of your usual wake up time) EVEN on the weekends. A regular wake up time promotes sleep hygiene and prevents sleep problems.  11. Reduce exposure to bright light in the last three hours of the day before going to sleep.  Maintaining good sleep hygiene and having good sleep habits lower your risk of developing sleep  problems. Getting better sleep can also improve your concentration and alertness. Try the simple steps in this guide. If you still have trouble getting enough rest, make an appointment with your health care provider.

## 2019-04-19 MED FILL — TRULICITY 1.5 MG/0.5 ML PEN: 1.5 | 84 days supply | Qty: 6 | Fill #0

## 2019-04-24 ENCOUNTER — Ambulatory Visit: Payer: 59 | Admitting: Family Medicine

## 2019-05-02 ENCOUNTER — Other Ambulatory Visit: Payer: 59 | Admitting: Women's Health

## 2019-05-09 ENCOUNTER — Ambulatory Visit: Payer: 59 | Admitting: Family Medicine

## 2019-05-10 ENCOUNTER — Encounter: Payer: Self-pay | Admitting: Family Medicine

## 2019-05-14 ENCOUNTER — Telehealth: Payer: Self-pay

## 2019-05-14 ENCOUNTER — Encounter: Payer: Self-pay | Admitting: Family Medicine

## 2019-05-14 NOTE — Telephone Encounter (Signed)
LeighAnn Candace Ramus, CMA  

## 2019-05-15 ENCOUNTER — Other Ambulatory Visit: Payer: 59 | Admitting: Adult Health

## 2019-05-17 ENCOUNTER — Other Ambulatory Visit: Payer: Self-pay | Admitting: Pulmonary Disease

## 2019-05-17 MED ORDER — DEXAMETHASONE 6 MG PO TABS: 6.0000 mg | ORAL_TABLET | Freq: Every day | ORAL | 0 refills | Status: AC

## 2019-05-17 MED FILL — DEXAMETHASONE 6 MG TABLET: 6 | 10 days supply | Qty: 10 | Fill #0

## 2019-05-20 MED FILL — LOSARTAN POTASSIUM 50 MG TA: 50 | 30 days supply | Qty: 30 | Fill #1

## 2019-05-23 ENCOUNTER — Encounter: Payer: Self-pay | Admitting: Family Medicine

## 2019-06-03 MED FILL — REPATHA SURECLICK 140 MG/ML: 140 | 28 days supply | Qty: 2 | Fill #1

## 2019-06-11 ENCOUNTER — Encounter: Payer: Self-pay | Admitting: Adult Health

## 2019-06-16 MED FILL — LOSARTAN POTASSIUM 50 MG TA: 50 | 30 days supply | Qty: 30 | Fill #2

## 2019-06-17 ENCOUNTER — Other Ambulatory Visit: Payer: Self-pay | Admitting: Family Medicine

## 2019-06-17 MED FILL — CITALOPRAM HBR 20 MG TABLET: 20 | 90 days supply | Qty: 90 | Fill #0

## 2019-06-17 MED FILL — PANTOPRAZOLE SOD DR 40 MG T: 40 | 90 days supply | Qty: 90 | Fill #0

## 2019-06-24 ENCOUNTER — Other Ambulatory Visit: Payer: 59 | Admitting: Adult Health

## 2019-06-29 MED FILL — REPATHA SURECLICK 140 MG/ML: 140 | 28 days supply | Qty: 2 | Fill #2

## 2019-06-29 MED FILL — MYCOPHENOLATE 500 MG TABLET: 500 | 90 days supply | Qty: 90 | Fill #2

## 2019-07-02 ENCOUNTER — Encounter: Payer: Self-pay | Admitting: Women's Health

## 2019-07-02 ENCOUNTER — Ambulatory Visit (INDEPENDENT_AMBULATORY_CARE_PROVIDER_SITE_OTHER): Payer: 59 | Admitting: Women's Health

## 2019-07-02 ENCOUNTER — Other Ambulatory Visit: Payer: Self-pay

## 2019-07-02 VITALS — BP 158/91 | HR 85 | Ht 62.0 in | Wt 187.5 lb

## 2019-07-02 DIAGNOSIS — Z01419 Encounter for gynecological examination (general) (routine) without abnormal findings: Secondary | ICD-10-CM

## 2019-07-02 NOTE — Progress Notes (Signed)
   WELL-WOMAN EXAMINATION Patient name: Alicia Neal MRN [id number]  Date of birth: [birth year] Chief Complaint:   Gynecologic Exam  History of Present Illness:   Alicia Neal is a 58 y.o. G19P2 Caucasian female being seen today for a routine well-woman exam.  Current complaints: none Postmenopausal since 58yo Had Covid in July, was sick x 3wks  PCP: Warren Lacy manages DM      All labs done by PCP No LMP recorded. Patient is postmenopausal. The current method of family planning is tubal ligation.  Last pap 09/25/17. Results were: neg w/ -HRHPV, h/o abnormal pap w/ colpo and cyrosurgery w/ 5 subsequent normal paps Last mammogram: 11/02/18. Results were: normal. Family h/o breast cancer: No Last colonoscopy: 2010. Results were: normal. Family h/o colorectal cancer: Yes, maternal grandparents and [facility] Review of Systems:   Pertinent items are noted in HPI Denies any headaches, blurred vision, fatigue, shortness of breath, chest pain, abdominal pain, abnormal vaginal discharge/itching/odor/irritation, problems with periods, bowel movements, urination, or intercourse unless otherwise stated above. Pertinent History Reviewed:  Reviewed past medical,surgical, social and family history.  Reviewed problem list, medications and allergies. Physical Assessment:   Vitals:   07/02/19 1343  BP: (!) 158/91  Pulse: 85  Weight: 187 lb 8 oz (85 kg)  Height: 5\' 2"  (1.575 m)  Body mass index is 34.29 kg/m.        Physical Examination:   General appearance - well appearing, and in no distress  Mental status - alert, oriented to person, place, and time  Psych:  She has a normal mood and affect  Skin - warm and dry, normal color, no suspicious lesions noted  Chest - effort normal, all lung fields clear to auscultation bilaterally  Heart - normal rate and regular rhythm  Neck:  midline trachea, no thyromegaly or nodules  Breasts - breasts appear normal, no suspicious masses, no skin or nipple  changes or  axillary nodes  Abdomen - soft, nontender, nondistended, no masses or organomegaly  Pelvic - VULVA: normal appearing vulva with no masses, tenderness or lesions  VAGINA: normal appearing vagina with normal color and discharge, no lesions  CERVIX: normal appearing cervix without discharge or lesions, no CMT  Thin prep pap is not done  UTERUS: uterus is felt to be normal size, shape, consistency and nontender   ADNEXA: No adnexal masses or tenderness noted.  Extremities:  No swelling or varicosities noted  No results found for this or any previous visit (from the past 24 hour(s)).  Assessment & Plan:  1) Well-Woman Exam  2) CHTN, Type2DM, hyperlipidemia, dep/anx> managed by PCP and Dr. Dorris Fetch  3) Postmenopausal  Labs/procedures today: none  Mammogram in Dec, or sooner if problems Colonoscopy- due this year, pt to call to schedule  No orders of the defined types were placed in this encounter.   Meds: No orders of the defined types were placed in this encounter.   Follow-up: Return in about 1 year (around 07/01/2020) for Pap & physical.  Roma Schanz CNM, [facility] 07/02/2019 2:20 PM

## 2019-07-08 ENCOUNTER — Other Ambulatory Visit: Payer: Self-pay | Admitting: Family Medicine

## 2019-07-08 MED FILL — ACCU-CHEK GUIDE TEST STRIP: 90 days supply | Qty: 200 | Fill #0

## 2019-07-08 MED FILL — metFORMIN HCL 1000 MG TABS: 1000 | 90 days supply | Qty: 180 | Fill #1

## 2019-07-10 ENCOUNTER — Other Ambulatory Visit: Payer: Self-pay

## 2019-07-10 MED ORDER — ACCU-CHEK GUIDE VI STRP: ORAL_STRIP | 3 refills | Status: DC

## 2019-07-21 MED FILL — LOSARTAN POTASSIUM 50 MG TA: 50 | 30 days supply | Qty: 30 | Fill #3

## 2019-07-22 ENCOUNTER — Other Ambulatory Visit: Payer: Self-pay | Admitting: Family Medicine

## 2019-07-22 MED FILL — ACCU-CHEK FASTCLIX LANCETS: 90 days supply | Qty: 102 | Fill #0

## 2019-07-24 MED FILL — TRULICITY 1.5 MG/0.5 ML PEN: 1.5 | 84 days supply | Qty: 6 | Fill #1

## 2019-07-25 ENCOUNTER — Ambulatory Visit: Payer: 59 | Admitting: Family Medicine

## 2019-07-29 ENCOUNTER — Other Ambulatory Visit: Payer: Self-pay

## 2019-07-29 ENCOUNTER — Ambulatory Visit: Payer: 59 | Admitting: Family Medicine

## 2019-07-29 ENCOUNTER — Encounter: Payer: Self-pay | Admitting: Family Medicine

## 2019-07-29 VITALS — BP 148/86 | HR 84 | Temp 98.4°F | Resp 15 | Ht 62.0 in | Wt 189.0 lb

## 2019-07-29 DIAGNOSIS — Z23 Encounter for immunization: Secondary | ICD-10-CM

## 2019-07-29 DIAGNOSIS — L121 Cicatricial pemphigoid: Secondary | ICD-10-CM | POA: Diagnosis not present

## 2019-07-29 DIAGNOSIS — E1159 Type 2 diabetes mellitus with other circulatory complications: Secondary | ICD-10-CM

## 2019-07-29 DIAGNOSIS — E785 Hyperlipidemia, unspecified: Secondary | ICD-10-CM | POA: Diagnosis not present

## 2019-07-29 DIAGNOSIS — F5104 Psychophysiologic insomnia: Secondary | ICD-10-CM | POA: Diagnosis not present

## 2019-07-29 DIAGNOSIS — I10 Essential (primary) hypertension: Secondary | ICD-10-CM | POA: Diagnosis not present

## 2019-07-29 DIAGNOSIS — F411 Generalized anxiety disorder: Secondary | ICD-10-CM

## 2019-07-29 DIAGNOSIS — E559 Vitamin D deficiency, unspecified: Secondary | ICD-10-CM | POA: Diagnosis not present

## 2019-07-29 DIAGNOSIS — E669 Obesity, unspecified: Secondary | ICD-10-CM | POA: Diagnosis not present

## 2019-07-29 MED ORDER — CITALOPRAM HYDROBROMIDE 40 MG PO TABS: 40.0000 mg | ORAL_TABLET | Freq: Every day | ORAL | 1 refills | Status: DC

## 2019-07-29 MED ORDER — LOSARTAN POTASSIUM 100 MG PO TABS: 100.0000 mg | ORAL_TABLET | Freq: Every day | ORAL | 3 refills | Status: DC

## 2019-07-29 MED ORDER — BUSPIRONE HCL 5 MG PO TABS: 5.0000 mg | ORAL_TABLET | Freq: Two times a day (BID) | ORAL | 5 refills | Status: DC

## 2019-07-29 MED ORDER — TEMAZEPAM 7.5 MG PO CAPS: 7.5000 mg | ORAL_CAPSULE | Freq: Every day | ORAL | 3 refills | Status: DC

## 2019-07-29 MED FILL — busPIRone HCL 5 MG TABS: 5 | 30 days supply | Qty: 60 | Fill #0

## 2019-07-29 MED FILL — CITALOPRAM HBR 40 MG TABLET: 40 | 90 days supply | Qty: 90 | Fill #0

## 2019-07-29 MED FILL — TEMAZEPAM 7.5 MG CAPSULE: 7.5 | 30 days supply | Qty: 30 | Fill #0

## 2019-07-29 MED FILL — LOSARTAN POTASSIUM 100 MG T: 100 | 30 days supply | Qty: 30 | Fill #0

## 2019-07-29 NOTE — Patient Instructions (Addendum)
F/u in 8 weeks in office with md call if you need me sooner  Pneumonia 23 today  Foot exam today shows slightly reduced sensation at heels so important to check feet daily  Fasting lipid, cmp and eGFr, TSH, HBA1C, microalb and vit D asap  Medication changes as discussed Blood pressure too high, increase losartan to 100 mg daily ( now on 50 mg)   It is important that you exercise regularly at least 30 minutes 5 times a week. If you develop chest pain, have severe difficulty breathing, or feel very tired, stop exercising immediately and seek medical attention   Think about what you will eat, plan ahead. Choose " clean, green, fresh or frozen" over canned, processed or packaged foods which are more sugary, salty and fatty. 70 to 75% of food eaten should be vegetables and fruit. Three meals at set times with snacks allowed between meals, but they must be fruit or vegetables. Aim to eat over a 12 hour period , example 7 am to 7 pm, and STOP after  your last meal of the day. Drink water,generally about 64 ounces per day, no other drink is as healthy. Fruit juice is best enjoyed in a healthy way, by EATING the fruit.

## 2019-07-31 ENCOUNTER — Other Ambulatory Visit (HOSPITAL_COMMUNITY)
Admission: RE | Admit: 2019-07-31 | Discharge: 2019-07-31 | Disposition: A | Discharge status: Home / Self Care | Payer: 59 | Source: Ambulatory Visit | Attending: Family Medicine | Admitting: Family Medicine

## 2019-07-31 DIAGNOSIS — E559 Vitamin D deficiency, unspecified: Secondary | ICD-10-CM | POA: Insufficient documentation

## 2019-07-31 DIAGNOSIS — E1159 Type 2 diabetes mellitus with other circulatory complications: Secondary | ICD-10-CM | POA: Insufficient documentation

## 2019-07-31 DIAGNOSIS — E782 Mixed hyperlipidemia: Secondary | ICD-10-CM | POA: Insufficient documentation

## 2019-08-01 ENCOUNTER — Encounter: Payer: Self-pay | Admitting: Family Medicine

## 2019-08-01 ENCOUNTER — Other Ambulatory Visit (HOSPITAL_COMMUNITY)
Admission: RE | Admit: 2019-08-01 | Discharge: 2019-08-01 | Disposition: A | Discharge status: Home / Self Care | Payer: 59 | Source: Ambulatory Visit | Attending: Family Medicine | Admitting: Family Medicine

## 2019-08-01 DIAGNOSIS — E1159 Type 2 diabetes mellitus with other circulatory complications: Secondary | ICD-10-CM | POA: Insufficient documentation

## 2019-08-01 DIAGNOSIS — E782 Mixed hyperlipidemia: Secondary | ICD-10-CM | POA: Diagnosis not present

## 2019-08-01 DIAGNOSIS — E559 Vitamin D deficiency, unspecified: Secondary | ICD-10-CM | POA: Insufficient documentation

## 2019-08-01 LAB — LIPID PANEL
Cholesterol: 182 mg/dL (ref 0–200)
HDL: 55 mg/dL (ref >40)
LDL Cholesterol: 102 mg/dL — ABNORMAL HIGH (ref 0–99)
Total CHOL/HDL Ratio: 3.3 RATIO
Triglycerides: 123 mg/dL (ref <150)
VLDL: 25 mg/dL (ref 0–40)

## 2019-08-01 LAB — COMPREHENSIVE METABOLIC PANEL
ALT: 54 U/L — ABNORMAL HIGH (ref 0–44)
AST: 39 U/L (ref 15–41)
Albumin: 4.3 g/dL (ref 3.5–5.0)
Alkaline Phosphatase: 53 U/L (ref 38–126)
Anion gap: 9 (ref 5–15)
BUN: 14 mg/dL (ref 6–20)
CO2: 27 mmol/L (ref 22–32)
Calcium: 8.9 mg/dL (ref 8.9–10.3)
Chloride: 101 mmol/L (ref 98–111)
Creatinine, Ser: 0.6 mg/dL (ref 0.44–1.00)
GFR calc Af Amer: >60 mL/min (ref >60)
GFR calc non Af Amer: >60 mL/min (ref >60)
Glucose, Bld: 141 mg/dL — ABNORMAL HIGH (ref 70–99)
Potassium: 4.3 mmol/L (ref 3.5–5.1)
Sodium: 137 mmol/L (ref 135–145)
Total Bilirubin: 0.6 mg/dL (ref 0.3–1.2)
Total Protein: 7.2 g/dL (ref 6.5–8.1)

## 2019-08-01 LAB — TSH: TSH: 1.483 u[IU]/mL (ref 0.350–4.500)

## 2019-08-01 LAB — HEMOGLOBIN A1C
Hgb A1c MFr Bld: 8 % — ABNORMAL HIGH (ref 4.8–5.6)
Mean Plasma Glucose: 182.9 mg/dL

## 2019-08-02 ENCOUNTER — Encounter: Payer: Self-pay | Admitting: Family Medicine

## 2019-08-02 LAB — MICROALBUMIN / CREATININE URINE RATIO
Creatinine, Urine: 116.8 mg/dL
Microalb Creat Ratio: 22 mg/g creat (ref 0–29)
Microalb, Ur: 26.1 ug/mL — ABNORMAL HIGH

## 2019-08-02 LAB — VITAMIN D 25 HYDROXY (VIT D DEFICIENCY, FRACTURES): Vit D, 25-Hydroxy: 33.9 ng/mL (ref 30.0–100.0)

## 2019-08-03 ENCOUNTER — Encounter: Payer: Self-pay | Admitting: Family Medicine

## 2019-08-03 DIAGNOSIS — E66811 Obesity, class 1: Secondary | ICD-10-CM | POA: Insufficient documentation

## 2019-08-03 DIAGNOSIS — E669 Obesity, unspecified: Secondary | ICD-10-CM | POA: Insufficient documentation

## 2019-08-03 DIAGNOSIS — Z23 Encounter for immunization: Secondary | ICD-10-CM | POA: Insufficient documentation

## 2019-08-03 NOTE — Assessment & Plan Note (Signed)
Hyperlipidemia:Low fat diet discussed and encouraged.   Lipid Panel  Lab Results  Component Value Date   CHOL 182 08/01/2019   HDL 55 08/01/2019   LDLCALC 102 (H) 08/01/2019   TRIG 123 08/01/2019   CHOLHDL 3.3 08/01/2019   Marked improvement on current medication, MX per Endo

## 2019-08-03 NOTE — Progress Notes (Signed)
Alicia Neal     MRN: [id number]      DOB: 1961-01-15   HPI Alicia Neal is here for follow up and re-evaluation of chronic medical conditions, medication management and review of any available recent lab and radiology data.  Preventive health is updated, specifically  Cancer screening and Immunization.   Questions or concerns regarding consultations or procedures which the PT has had in the interim are  addressed. The PT denies any adverse reactions to current medications since the last visit.  C/o increased and uncontrolled anxiety and depression and social withdrawal over past several weeks, resulting in poor food choice and uncontrolled diabetes.c/o poor sleep She is still walking daily ROS Denies recent fever or chills. Denies sinus pressure, nasal congestion, ear pain or sore throat. Denies chest congestion, productive cough or wheezing. Denies chest pains, palpitations and leg swelling Denies abdominal pain, nausea, vomiting,diarrhea or constipation.   Denies dysuria, frequency, hesitancy or incontinence. Denies joint pain, swelling and limitation in mobility. Denies headaches, seizures, numbness, or tingling. Denies skin break down or rash.   PE  BP (!) 148/86   Pulse 84   Temp 98.4 F (36.9 C) (Temporal)   Resp 15   Ht 5\' 2"  (1.575 m)   Wt 189 lb (85.7 kg)   SpO2 97%   BMI 34.57 kg/m    Patient alert and oriented and in no cardiopulmonary distress.  HEENT: No facial asymmetry, EOMI,   oropharynx pink and moist.  Neck supple no JVD, no mass.  Chest: Clear to auscultation bilaterally.  CVS: S1, S2 no murmurs, no S3.Regular rate.  ABD: Soft non tender.   Ext: No edema  MS: Adequate ROM spine, shoulders, hips and knees.  Skin: Intact, no ulcerations or rash noted.  Psych: Good eye contact, normal affect. Memory intact  anxious not  depressed appearing.  CNS: CN 2-12 intact, power,  normal throughout.no focal deficits noted.   Assessment & Plan   Essential hypertension, benign DASH diet and commitment to daily physical activity for a minimum of 30 minutes discussed and encouraged, as a part of hypertension management. The importance of attaining a healthy weight is also discussed.  BP/Weight 07/29/2019 07/02/2019 04/17/2019 12/10/2018 12/06/2018 06/19/2018 [id number]  Systolic BP 123456 0000000 - Q000111Q 123456 - [id number]  Diastolic BP 86 91 - 90 82 - 87  Wt. (Lbs) 189 187.5 184 180 182 182 182  BMI 34.57 34.29 34.2 32.92 33.29 33.83 33.83   Uncontrolled, increase dose of losarrtan       Type 2 diabetes mellitus with vascular disease ([place]) Deteriorated in recent times, Endo treats, will send updated hBA1C for his recview Alicia Neal is reminded of the importance of commitment to daily physical activity for 30 minutes or more, as able and the need to limit carbohydrate intake to 30 to 60 grams per meal to help with blood sugar control.   The need to take medication as prescribed, test blood sugar as directed, and to call between visits if there is a concern that blood sugar is uncontrolled is also discussed.   Alicia Neal is reminded of the importance of daily foot exam, annual eye examination, and good blood sugar, blood pressure and cholesterol control.  Diabetic Labs Latest Ref Rng & Units 08/01/2019 04/04/2019 11/29/2018 06/08/2018 03/09/2018  HbA1c 4.8 - 5.6 % 8.0(H) 7.4(H) 6.7(H) 7.4(H) 8.0(H)  Microalbumin Not Estab. ug/mL 26.1(H) - - 7.8(H) -  Micro/Creat Ratio 0 - 29 mg/g creat 22 - - 19.9 -  Chol 0 - 200 mg/dL 182 - 258(H) 167 229(H)  HDL >40 mg/dL 55 - 56 47 54  Calc LDL 0 - 99 mg/dL 102(H) - 162(H) 96 147(H)  Triglycerides <150 mg/dL 123 - 202(H) 122 140  Creatinine 0.44 - 1.00 mg/dL 0.60 0.61 0.68 0.71 0.64   BP/Weight 07/29/2019 07/02/2019 04/17/2019 12/10/2018 12/06/2018 06/19/2018 [id number]  Systolic BP - 0000000 - Q000111Q 123456 - [id number]  Diastolic BP - 91 - 90 82 - 87  Wt. (Lbs) 189 187.5 184 180 182 182 182  BMI 34.57 34.29 34.2 32.92 33.29 33.83 33.83    Foot/eye exam completion dates Latest Ref Rng & Units 07/29/2019 11/02/2018  Eye Exam No Retinopathy - No Retinopathy  Foot Form Completion - Done -        Hyperlipidemia LDL goal <100 Hyperlipidemia:Low fat diet discussed and encouraged.   Lipid Panel  Lab Results  Component Value Date   CHOL 182 08/01/2019   HDL 55 08/01/2019   LDLCALC 102 (H) 08/01/2019   TRIG 123 08/01/2019   CHOLHDL 3.3 08/01/2019   Marked improvement on current medication, MX per Endo    GAD (generalized anxiety disorder) Uncontrolled , resume buspar  Pemphigoid, cicatricial Stable and controlled  Insomnia Sleep hygiene reviewed and written information offered also. Prescription sent for  medication needed.   Obesity (BMI 30.0-34.9)  Patient re-educated about  the importance of commitment to a  minimum of 150 minutes of exercise per week as able.  The importance of healthy food choices with portion control discussed, as well as eating regularly and within a 12 hour window most days. The need to choose "clean , green" food 50 to 75% of the time is discussed, as well as to make water the primary drink and set a goal of 64 ounces water daily.    Weight /BMI 07/29/2019 07/02/2019 04/17/2019  WEIGHT 189 lb 187 lb 8 oz 184 lb  HEIGHT 5\' 2"  5\' 2"  5' 1.5"  BMI 34.57 kg/m2 34.29 kg/m2 34.2 kg/m2      Need for 23-polyvalent pneumococcal polysaccharide vaccine After obtaining informed consent, the vaccine is  administered , with no adverse effect noted at the time of administration.

## 2019-08-03 NOTE — Assessment & Plan Note (Signed)
Stable and controlled

## 2019-08-03 NOTE — Assessment & Plan Note (Signed)
  Patient re-educated about  the importance of commitment to a  minimum of 150 minutes of exercise per week as able.  The importance of healthy food choices with portion control discussed, as well as eating regularly and within a 12 hour window most days. The need to choose "clean , green" food 50 to 75% of the time is discussed, as well as to make water the primary drink and set a goal of 64 ounces water daily.    Weight /BMI 07/29/2019 07/02/2019 04/17/2019  WEIGHT 189 lb 187 lb 8 oz 184 lb  HEIGHT 5\' 2"  5\' 2"  5' 1.5"  BMI 34.57 kg/m2 34.29 kg/m2 34.2 kg/m2

## 2019-08-03 NOTE — Assessment & Plan Note (Signed)
After obtaining informed consent, the vaccine is  administered , with no adverse effect noted at the time of administration.  

## 2019-08-03 NOTE — Assessment & Plan Note (Signed)
Sleep hygiene reviewed and written information offered also. Prescription sent for  medication needed.  

## 2019-08-03 NOTE — Assessment & Plan Note (Addendum)
DASH diet and commitment to daily physical activity for a minimum of 30 minutes discussed and encouraged, as a part of hypertension management. The importance of attaining a healthy weight is also discussed.  BP/Weight 07/29/2019 07/02/2019 04/17/2019 12/10/2018 12/06/2018 06/19/2018 123XX123  Systolic BP 123456 0000000 - Q000111Q 123456 - [id number]  Diastolic BP 86 91 - 90 82 - 87  Wt. (Lbs) 189 187.5 184 180 182 182 182  BMI 34.57 34.29 34.2 32.92 33.29 33.83 33.83   Uncontrolled, increase dose of losarrtan

## 2019-08-03 NOTE — Assessment & Plan Note (Signed)
Uncontrolled , resume buspar

## 2019-08-03 NOTE — Assessment & Plan Note (Signed)
Deteriorated in recent times, Endo treats, will send updated hBA1C for his recview Ms. Klute is reminded of the importance of commitment to daily physical activity for 30 minutes or more, as able and the need to limit carbohydrate intake to 30 to 60 grams per meal to help with blood sugar control.   The need to take medication as prescribed, test blood sugar as directed, and to call between visits if there is a concern that blood sugar is uncontrolled is also discussed.   Ms. Contorno is reminded of the importance of daily foot exam, annual eye examination, and good blood sugar, blood pressure and cholesterol control.  Diabetic Labs Latest Ref Rng & Units 08/01/2019 04/04/2019 11/29/2018 06/08/2018 03/09/2018  HbA1c 4.8 - 5.6 % 8.0(H) 7.4(H) 6.7(H) 7.4(H) 8.0(H)  Microalbumin Not Estab. ug/mL 26.1(H) - - 7.8(H) -  Micro/Creat Ratio 0 - 29 mg/g creat 22 - - 19.9 -  Chol 0 - 200 mg/dL 182 - 258(H) 167 229(H)  HDL >40 mg/dL 55 - 56 47 54  Calc LDL 0 - 99 mg/dL 102(H) - 162(H) 96 147(H)  Triglycerides <150 mg/dL 123 - 202(H) 122 140  Creatinine 0.44 - 1.00 mg/dL 0.60 0.61 0.68 0.71 0.64   BP/Weight 07/29/2019 07/02/2019 04/17/2019 12/10/2018 12/06/2018 06/19/2018 [id number]  Systolic BP - 0000000 - Q000111Q 123456 - [id number]  Diastolic BP - 91 - 90 82 - 87  Wt. (Lbs) 189 187.5 184 180 182 182 182  BMI 34.57 34.29 34.2 32.92 33.29 33.83 33.83   Foot/eye exam completion dates Latest Ref Rng & Units 07/29/2019 11/02/2018  Eye Exam No Retinopathy - No Retinopathy  Foot Form Completion - Done -

## 2019-08-06 MED FILL — REPATHA SURECLICK 140 MG/ML: 140 | 28 days supply | Qty: 2 | Fill #3

## 2019-09-09 ENCOUNTER — Other Ambulatory Visit: Payer: Self-pay | Admitting: "Endocrinology

## 2019-09-10 ENCOUNTER — Other Ambulatory Visit: Payer: Self-pay | Admitting: "Endocrinology

## 2019-09-10 MED FILL — REPATHA SURECLICK 140 MG/ML: 140 | 28 days supply | Qty: 2 | Fill #0

## 2019-09-16 ENCOUNTER — Encounter: Payer: Self-pay | Admitting: Family Medicine

## 2019-09-16 ENCOUNTER — Encounter: Payer: Self-pay | Admitting: "Endocrinology

## 2019-09-16 ENCOUNTER — Ambulatory Visit (INDEPENDENT_AMBULATORY_CARE_PROVIDER_SITE_OTHER): Payer: 59 | Admitting: Family Medicine

## 2019-09-16 ENCOUNTER — Ambulatory Visit (INDEPENDENT_AMBULATORY_CARE_PROVIDER_SITE_OTHER): Payer: 59 | Admitting: "Endocrinology

## 2019-09-16 ENCOUNTER — Telehealth: Payer: Self-pay | Admitting: "Endocrinology

## 2019-09-16 ENCOUNTER — Other Ambulatory Visit: Payer: Self-pay

## 2019-09-16 VITALS — BP 148/94 | HR 70 | Temp 97.4°F | Resp 15 | Ht 62.0 in | Wt 191.0 lb

## 2019-09-16 DIAGNOSIS — F411 Generalized anxiety disorder: Secondary | ICD-10-CM

## 2019-09-16 DIAGNOSIS — E785 Hyperlipidemia, unspecified: Secondary | ICD-10-CM | POA: Diagnosis not present

## 2019-09-16 DIAGNOSIS — E669 Obesity, unspecified: Secondary | ICD-10-CM | POA: Diagnosis not present

## 2019-09-16 DIAGNOSIS — I10 Essential (primary) hypertension: Secondary | ICD-10-CM | POA: Diagnosis not present

## 2019-09-16 DIAGNOSIS — E1165 Type 2 diabetes mellitus with hyperglycemia: Secondary | ICD-10-CM

## 2019-09-16 DIAGNOSIS — E782 Mixed hyperlipidemia: Secondary | ICD-10-CM | POA: Diagnosis not present

## 2019-09-16 MED ORDER — TRESIBA FLEXTOUCH 100 UNIT/ML ~~LOC~~ SOPN: 20.0000 [IU] | PEN_INJECTOR | Freq: Every day | SUBCUTANEOUS | 2 refills | Status: DC

## 2019-09-16 MED ORDER — BD PEN NEEDLE SHORT U/F 31G X 8 MM MISC: 1.0000 | 3 refills | Status: DC

## 2019-09-16 MED ORDER — BETAMETHASONE DIPROPIONATE 0.05 % EX CREA: TOPICAL_CREAM | Freq: Two times a day (BID) | CUTANEOUS | 0 refills | Status: DC

## 2019-09-16 MED FILL — TRESIBA FLEXTOUCH 100 UNITS: 100 | 75 days supply | Qty: 15 | Fill #0

## 2019-09-16 MED FILL — UNIFINE PENTIPS 8MM 31G: 31G X 8 MM | 90 days supply | Qty: 100 | Fill #0

## 2019-09-16 NOTE — Progress Notes (Signed)
09/16/2019, 4:03 PM  Endocrinology follow-up note    Subjective:    Patient ID: Alicia Neal, female    DOB: 02/25/1961.  Alicia Neal is being seen in follow-up for management of currently uncontrolled symptomatic diabetes requested by  Fayrene Helper, MD.   Past Medical History:  Diagnosis Date  . Adrenal adenoma    right side  . Allergic rhinitis   . Anxiety disorder   . Carpal tunnel syndrome of left wrist   . Chronic depression    Dr. Cheryln Manly  . GERD (gastroesophageal reflux disease)   . History of abnormal cervical Pap smear   . History of adenomatous polyp of colon   . History of hiatal hernia   . Hyperlipidemia   . Hypertension   . IBS (irritable bowel syndrome)   . OSA (obstructive sleep apnea)    per pt study yrs ago-- CPAP intolerant   . Pemphigoid, cicatricial    pruritic autoimmune blistering skin disorder  . PONV (postoperative nausea and vomiting)   . Pulmonary nodule, right pulmologist-  dr Nathaneil Canary mcquaid   x2    per CT 09-11-2015  . Trigger finger, left    ring finger  . Type 2 diabetes mellitus ([place])   . Uncontrolled type 2 diabetes mellitus with hyperglycemia ([place]) 04/17/2018  . Wears glasses    Past Surgical History:  Procedure Laterality Date  . CARDIOVASCULAR STRESS TEST  04-30-2003   Small focus of decreased perfusion on rest images in the mid-distal anterior wall (worrisome for ischemia),  normal LV function and wall motion , ef 60%  . CARPAL TUNNEL RELEASE Left 11/26/2015   Procedure: CARPAL TUNNEL RELEASE;  Surgeon: Iran Planas, MD;  Location: [facility];  Service: Orthopedics;  Laterality: Left;  . CHOLECYSTECTOMY  1989  . TRANSTHORACIC ECHOCARDIOGRAM  04-30-2003   mild hypokinetic in the inferior and posterior wall, ef 50%/  trivial MR /  mild TR  . TRIGGER FINGER RELEASE Left 11/26/2015   Procedure: RELEASE TRIGGER FINGER/A-1 PULLEY LEFT RING  FINGER;  Surgeon: Iran Planas, MD;  Location: [facility];   Service: Orthopedics;  Laterality: Left;  . TUBAL LIGATION  2000   Social History   Socioeconomic History  . Marital status: Single    Spouse name: Not on file  . Number of children: 1  . Years of education: Not on file  . Highest education level: Not on file  Occupational History  . Not on file  Social Needs  . Financial resource strain: Not on file  . Food insecurity    Worry: Not on file    Inability: Not on file  . Transportation needs    Medical: Not on file    Non-medical: Not on file  Tobacco Use  . Smoking status: Former Smoker    Packs/day: 1.00    Years: 40.00    Pack years: 40.00    Types: Cigarettes    Quit date: 11/07/2013    Years since quitting: 5.8  . Smokeless tobacco: Never Used  Substance and Sexual Activity  . Alcohol use: Yes    Alcohol/week: 0.0 standard drinks    Comment: occasional  . Drug use: No  . Sexual activity: Not Currently    Birth control/protection: Post-menopausal  Lifestyle  . Physical activity    Days per week: Not on file    Minutes per session: Not on file  . Stress: Not on file  Relationships  . Social Herbalist on phone: Not on file    Gets together: Not on file    Attends religious service: Not on file    Active member of club or organization: Not on file    Attends meetings of clubs or organizations: Not on file    Relationship status: Not on file  Other Topics Concern  . Not on file  Social History Narrative  . Not on file   Outpatient Encounter Medications as of 09/16/2019  Medication Sig  . Accu-Chek FastClix Lancets MISC USE TO CHECK BLOOD SUGAR ONCE A DAY  . Cholecalciferol (VITAMIN D3 PO) Take by mouth as needed.  . citalopram (CELEXA) 40 MG tablet Take 1 tablet (40 mg total) by mouth daily.  . Dulaglutide (TRULICITY) 1.5 0000000 SOPN INJECT 1.5 MG INTO THE SKIN ONCE A WEEK.  Marland Kitchen glucose blood (ACCU-CHEK GUIDE) test strip USE TO CHECK BLOOD SUGAR TWICE DAILY  . insulin degludec (TRESIBA FLEXTOUCH)  100 UNIT/ML SOPN FlexTouch Pen Inject 0.2 mLs (20 Units total) into the skin daily.  . Insulin Pen Needle (B-D ULTRAFINE III SHORT PEN) 31G X 8 MM MISC 1 each by Does not apply route as directed.  Marland Kitchen losartan (COZAAR) 100 MG tablet Take 1 tablet (100 mg total) by mouth daily.  . metFORMIN (GLUCOPHAGE) 1000 MG tablet TAKE 1 TABLET (1,000 MG TOTAL) BY MOUTH 2 TIMES DAILY WITH A MEAL.  . mycophenolate (CELLCEPT) 500 MG tablet Take 500 mg by mouth daily.   . pantoprazole (PROTONIX) 40 MG tablet TAKE 1 TABLET (40 MG TOTAL) BY MOUTH DAILY.  Marland Kitchen REPATHA SURECLICK [id number] MG/ML SOAJ INJECT 140 MG INTO THE SKIN EVERY 14 (FOURTEEN) DAYS.  . [DISCONTINUED] betamethasone dipropionate 0.05 % cream Apply topically 2 (two) times daily. Apply twice daly TO affected area for 1 week, then , as needed   No facility-administered encounter medications on file as of 09/16/2019.     ALLERGIES: Allergies  Allergen Reactions  . Ezetimibe-Simvastatin Other (See Comments)    REACTION: muscle aches  . Pravachol [Pravastatin Sodium] Other (See Comments)    Joint pains and memory loss  . Simvastatin Other (See Comments)    REACTION: muscle aches  . Zetia [Ezetimibe] Other (See Comments)    Generalized joint pains    VACCINATION STATUS: Immunization History  Administered Date(s) Administered  . Influenza Split 07/19/2013, 07/22/2014  . Pneumococcal Conjugate-13 06/25/2014  . Pneumococcal Polysaccharide-23 07/29/2019  . Tdap 06/25/2014  . Zoster Recombinat (Shingrix) 06/12/2018    Diabetes She presents for her follow-up diabetic visit. She has type 2 diabetes mellitus. Onset time: She was diagnosed at approximate age of 58 years. Her disease course has been worsening (She reports A1c of 13% at the time of diagnosis, with subsequent improvement to near target range and recent reverse to 8% in May 2019.). Pertinent negatives for hypoglycemia include no confusion, headaches, hunger, pallor or seizures. Pertinent negatives  for diabetes include no chest pain, no fatigue, no polydipsia, no polyphagia and no polyuria. There are no hypoglycemic complications. Symptoms are worsening. There are no diabetic complications. Risk factors for coronary artery disease include diabetes mellitus, dyslipidemia, hypertension, tobacco exposure and post-menopausal. Current diabetic treatments: She is currently on glipizide 5 mg p.o. twice daily and Janumet 50/1000 mg p.o. twice daily. Her weight is decreasing steadily. She is following a generally unhealthy diet. When asked about meal planning, she reported none. She has not had a previous visit with a dietitian. She participates  in exercise intermittently. Her home blood glucose trend is increasing steadily. Her breakfast blood glucose range is generally 180-200 mg/dl. Her bedtime blood glucose range is generally >200 mg/dl. Her overall blood glucose range is >200 mg/dl. An ACE inhibitor/angiotensin II receptor blocker is being taken. Eye exam is current.  Hyperlipidemia This is a chronic problem. The current episode started more than 1 year ago. The problem is uncontrolled. Recent lipid tests were reviewed and are high. Exacerbating diseases include diabetes. Pertinent negatives include no chest pain, myalgias or shortness of breath. Treatments tried: Patient is not tolerating statins including Livalo. Risk factors for coronary artery disease include dyslipidemia, diabetes mellitus, hypertension and post-menopausal.  Hypertension This is a chronic problem. The current episode started more than 1 year ago. The problem is uncontrolled. Pertinent negatives include no chest pain, headaches, palpitations or shortness of breath. Risk factors for coronary artery disease include dyslipidemia, diabetes mellitus and smoking/tobacco exposure. Past treatments include angiotensin blockers.     Review of Systems  Constitutional: Negative for chills, fatigue, fever and unexpected weight change.  HENT:  Negative for trouble swallowing and voice change.   Eyes: Negative for visual disturbance.  Respiratory: Negative for cough, shortness of breath and wheezing.   Cardiovascular: Negative for chest pain, palpitations and leg swelling.  Gastrointestinal: Negative for diarrhea, nausea and vomiting.  Endocrine: Negative for cold intolerance, heat intolerance, polydipsia, polyphagia and polyuria.  Musculoskeletal: Negative for arthralgias and myalgias.  Skin: Negative for color change, pallor, rash and wound.  Neurological: Negative for seizures and headaches.  Psychiatric/Behavioral: Negative for confusion and suicidal ideas.    Objective:    There were no vitals taken for this visit.  Wt Readings from Last 3 Encounters:  09/16/19 191 lb (86.6 kg)  07/29/19 189 lb (85.7 kg)  07/02/19 187 lb 8 oz (85 kg)     CMP ( most recent) CMP     Component Value Date/Time   NA 137 08/01/2019 1000   K 4.3 08/01/2019 1000   CL 101 08/01/2019 1000   CO2 27 08/01/2019 1000   GLUCOSE 141 (H) 08/01/2019 1000   BUN 14 08/01/2019 1000   CREATININE 0.60 08/01/2019 1000   CREATININE 0.84 11/18/2016 0844   CALCIUM 8.9 08/01/2019 1000   PROT 7.2 08/01/2019 1000   ALBUMIN 4.3 08/01/2019 1000   AST 39 08/01/2019 1000   ALT 54 (H) 08/01/2019 1000   ALKPHOS 53 08/01/2019 1000   BILITOT 0.6 08/01/2019 1000   GFRNONAA >60 08/01/2019 1000   GFRNONAA 77 11/18/2016 0844   GFRAA >60 08/01/2019 1000   GFRAA 89 11/18/2016 0844     Diabetic Labs (most recent): Lab Results  Component Value Date   HGBA1C 8.0 (H) 08/01/2019   HGBA1C 7.4 (H) 04/04/2019   HGBA1C 6.7 (H) 11/29/2018     Lipid Panel ( most recent) Lipid Panel     Component Value Date/Time   CHOL 182 08/01/2019 1000   TRIG 123 08/01/2019 1000   HDL 55 08/01/2019 1000   CHOLHDL 3.3 08/01/2019 1000   VLDL 25 08/01/2019 1000   LDLCALC 102 (H) 08/01/2019 1000      Lab Results  Component Value Date   TSH 1.483 08/01/2019   TSH 1.601  06/08/2018   TSH 1.724 06/08/2017   TSH 1.620 06/08/2015   TSH 1.739 06/23/2014   TSH 1.659 04/15/2013   TSH 1.634 01/11/2012   TSH 1.344 11/04/2010   TSH 1.349 10/16/2009   TSH 2.028 01/25/2008   FREET4 0.92  06/08/2018      Assessment & Plan:   1. Uncontrolled type 2 diabetes mellitus with hyperglycemia ([place])  - Celita H Corban has currently uncontrolled symptomatic type 2 DM since 58 years of age. -She was called in due to loss of control with average blood glucose close to 200 mg per DL and A1c of 8%.  -She does not report any gross complications from her diabetes, howeverMonique H Micciche remains at a high risk for acute and chronic complications of type 2 diabetes which include CAD, CVA, CKD, retinopathy, and neuropathy. These are all discussed in l with the patient.  - I have counseled her on diet management and weight loss, by adopting a carbohydrate restricted/protein rich diet.  - she  admits there is a room for improvement in her diet and drink choices. -  Suggestion is made for her to avoid simple carbohydrates  from her diet including Cakes, Sweet Desserts / Pastries, Ice Cream, Soda (diet and regular), Sweet Tea, Candies, Chips, Cookies, Sweet Pastries,  Store Bought Juices, Alcohol in Excess of  1-2 drinks a day, Artificial Sweeteners, Coffee Creamer, and "Sugar-free" Products. This will help patient to have stable blood glucose profile and potentially avoid unintended weight gain.  - I encouraged her to switch to  unprocessed or minimally processed complex starch and increased protein intake (animal or plant source), fruits, and vegetables.  - she is advised to stick to a routine mealtimes to eat 3 meals  a day and avoid unnecessary snacks ( to snack only to correct hypoglycemia).   - she is scheduled  with Jearld Fenton, RDN, CDE for individualized diabetes education.  - I have approached her with the following individualized plan to manage diabetes and patient  agrees:   -Due to moderate loss of control in her glycemia and A1c of 8%, she is approached for early initiation of basal insulin and she agrees.  I discussed and prescribed Tresiba 20 units nightly, associated with monitoring of blood glucose 2 times a day-daily before breakfast and at bedtime and phone visit in 10 days with her logs.    She is advised to continue metformin 1000 mg p.o. twice daily, and Trulicity 1.5 mg subcutaneously weekly.  - Patient specific target  A1c;  LDL, HDL, Triglycerides, and  Waist Circumference were discussed in detail.  2) BP/HTN: Her blood pressure is controlled to target.  She has mild microalbuminuria.  He is advised to continue losartan to 50 mg p.o. daily.  3) Lipids/HPL:   Her recent lipid panel showed significantly improved LDL at 102 from 162.  She has benefited from [place], advised to continue Repatha 140 mg subcutaneously every other week.   she is advised to continue the Livalo 2 mg p.o. nightly.  4)  Weight/Diet: She reports some weight gain, CDE Consult has been  initiated , exercise, and detailed carbohydrates information provided.  5) Chronic Care/Health Maintenance:  -she  is on ACEI/ARB and Statin medications and  is encouraged to continue to follow up with Ophthalmology, Dentist,  Podiatrist at least yearly or according to recommendations, and advised to  stay away from smoking. I have recommended yearly flu vaccine and pneumonia vaccination at least every 5 years; moderate intensity exercise for up to 150 minutes weekly; and  sleep for at least 7 hours a day.  - I advised patient to maintain close follow up with Fayrene Helper, MD for primary care needs.  - Time spent with the patient: 25 min, of which >  50% was spent in reviewing her blood glucose logs , discussing her hypoglycemia and hyperglycemia episodes, reviewing her current and  previous labs / studies and medications  doses and developing a plan to avoid hypoglycemia and  hyperglycemia. Please refer to Patient Instructions for Blood Glucose Monitoring and Insulin/Medications Dosing Guide"  in media tab for additional information. Please  also refer to " Patient Self Inventory" in the Media  tab for reviewed elements of pertinent patient history.  AZRIELLA JUSKO participated in the discussions, expressed understanding, and voiced agreement with the above plans.  All questions were answered to her satisfaction. she is encouraged to contact clinic should she have any questions or concerns prior to her return visit.  Follow up plan: - Return in about 10 days (around 09/26/2019) for Follow up with Meter and Logs Only - no Labs.  Glade Lloyd, MD [facility] [address] [place], [place] [address] Phone: [phone]  Fax: [phone]    09/16/2019, 4:03 PM  This note was partially dictated with voice recognition software. Similar sounding words can be transcribed inadequately or may not  be corrected upon review.

## 2019-09-16 NOTE — Telephone Encounter (Signed)
Left pt a VM to call and schedule.

## 2019-09-16 NOTE — Telephone Encounter (Signed)
-----   Message from Cassandria Anger, MD sent at 09/16/2019  9:35 AM EST ----- Regarding: FW: Pt will be reaching out r blood sugar [place], Can you contact her and move up her appointment , even today , if she can make it? Thank you. ----- Message ----- From: Fayrene Helper, MD Sent: 09/16/2019   8:18 AM EST To: Cassandria Anger, MD, # Subject: Pt will be reaching out r blood sugar          GM, G,  Our mutual pt is in the office now, concerned re despite best effort avg FBG ias about 230, and was 274 yesterday.  Recent HBa1C which I took the liberty ordering was 8.1 in 08/01/2019, since she noted  blood sugar control worsening  I have asked her to directly contact you re medication management. She deoes have an appt in December but clearly needs help now. She is here at an appt with me  Thanks,  Have a great day, Joycelyn Schmid

## 2019-09-16 NOTE — Patient Instructions (Addendum)
F/U in early January, cal;l if you need me before  Good approach to stress and life, remember , help is available also for therapy, when you feel extra stressed  Please take blood pressure medication as prescribed , as tis is still high.  Continue healthy lifestyle  BEST for 2021!!

## 2019-09-16 NOTE — Progress Notes (Signed)
Alicia Neal     MRN: [id number]      DOB: 11-23-1960   HPI Alicia Neal is here for follow up and re-evaluation of chronic medical conditions, medication management and review of any available recent lab and radiology data.  Preventive health is updated, specifically  Cancer screening and Immunization.   Did not tolerate restoril, decided to give up the various stresses placed on her and has started  Feeling better and sleeping better. Her blood sugar is a major challenge and I have directed and requested help from Endo at the visit. She reports comp[liance with exdercise and diet and still fBG average over 170 ROS Denies recent fever or chills. Denies sinus pressure, nasal congestion, ear pain or sore throat. Denies chest congestion, productive cough or wheezing. Denies chest pains, palpitations and leg swelling Denies abdominal pain, nausea, vomiting,diarrhea or constipation.   Denies dysuria, frequency, hesitancy or incontinence. C/o shoulder pain especially since starting push ups. Denies headaches, seizures, numbness, or tingling. Denies depression,uncontrolled  anxiety or insomnia. Denies skin break down or rash. Has not complied with new dose of antihypertensive medication so still uncontrolled, states she will comply   PE  BP (!) 148/94   Pulse 70   Temp (!) 97.4 F (36.3 C) (Temporal)   Resp 15   Ht 5\' 2"  (1.575 m)   Wt 191 lb (86.6 kg)   SpO2 97%   BMI 34.93 kg/m   Patient alert and oriented and in no cardiopulmonary distress.  HEENT: No facial asymmetry, EOMI,     Neck supple .  Chest: Clear to auscultation bilaterally.  CVS: S1, S2 no murmurs, no S3.Regular rate.  ABD: Soft non tender.   Ext: No edema  MS: Adequate ROM spine, shoulders, hips and knees.  Skin: Intact, no ulcerations or rash noted.  Psych: Good eye contact, normal affect. Memory intact not anxious or depressed appearing.  CNS: CN 2-12 intact, power,  normal throughout.no focal  deficits noted.   Assessment & Plan  Essential hypertension, benign Uncontrolled, non compliant, re educated re importance of changing this DASH diet and commitment to daily physical activity for a minimum of 30 minutes discussed and encouraged, as a part of hypertension management. The importance of attaining a healthy weight is also discussed.  BP/Weight 09/16/2019 07/29/2019 07/02/2019 04/17/2019 12/10/2018 12/06/2018 [id number]  Systolic BP 123456 123456 0000000 - Q000111Q 123456 -  Diastolic BP 94 86 91 - 90 82 -  Wt. (Lbs) 191 189 187.5 184 180 182 182  BMI 34.93 34.57 34.29 34.2 32.92 33.29 33.83       Uncontrolled type 2 diabetes mellitus with hyperglycemia ([place]) Managed by Endo, she has deteriorated, medication adjustment and sooner appt is being arranged Alicia Neal is reminded of the importance of commitment to daily physical activity for 30 minutes or more, as able and the need to limit carbohydrate intake to 30 to 60 grams per meal to help with blood sugar control.   The need to take medication as prescribed, test blood sugar as directed, and to call between visits if there is a concern that blood sugar is uncontrolled is also discussed.   Alicia Neal is reminded of the importance of daily foot exam, annual eye examination, and good blood sugar, blood pressure and cholesterol control.  Diabetic Labs Latest Ref Rng & Units 08/01/2019 04/04/2019 11/29/2018 06/08/2018 03/09/2018  HbA1c 4.8 - 5.6 % 8.0(H) 7.4(H) 6.7(H) 7.4(H) 8.0(H)  Microalbumin Not Estab. ug/mL 26.1(H) - - 7.8(H) -  Micro/Creat Ratio 0 - 29 mg/g creat 22 - - 19.9 -  Chol 0 - 200 mg/dL 182 - 258(H) 167 229(H)  HDL >40 mg/dL 55 - 56 47 54  Calc LDL 0 - 99 mg/dL 102(H) - 162(H) 96 147(H)  Triglycerides <150 mg/dL 123 - 202(H) 122 140  Creatinine 0.44 - 1.00 mg/dL 0.60 0.61 0.68 0.71 0.64   BP/Weight 09/16/2019 07/29/2019 07/02/2019 04/17/2019 12/10/2018 12/06/2018 [id number]  Systolic BP 123456 123456 0000000 - Q000111Q 123456 -  Diastolic BP 94 86 91 - 90 82 -   Wt. (Lbs) 191 189 187.5 184 180 182 182  BMI 34.93 34.57 34.29 34.2 32.92 33.29 33.83   Foot/eye exam completion dates Latest Ref Rng & Units 07/29/2019 11/02/2018  Eye Exam No Retinopathy - No Retinopathy  Foot Form Completion - Done -        Hyperlipidemia LDL goal <100 Hyperlipidemia:Low fat diet discussed and encouraged.   Lipid Panel  Lab Results  Component Value Date   CHOL 182 08/01/2019   HDL 55 08/01/2019   LDLCALC 102 (H) 08/01/2019   TRIG 123 08/01/2019   CHOLHDL 3.3 08/01/2019   Encouraged to reduce fat intake as LDL slightly elevated   GAD (generalized anxiety disorder) Managed with personal psychotherapy, improved , states she was intolerant of buspar  Obesity (BMI 30.0-34.9)  Patient re-educated about  the importance of commitment to a  minimum of 150 minutes of exercise per week as able.  The importance of healthy food choices with portion control discussed, as well as eating regularly and within a 12 hour window most days. The need to choose "clean , green" food 50 to 75% of the time is discussed, as well as to make water the primary drink and set a goal of 64 ounces water daily.    Weight /BMI 09/16/2019 07/29/2019 07/02/2019  WEIGHT 191 lb 189 lb 187 lb 8 oz  HEIGHT 5\' 2"  5\' 2"  5\' 2"   BMI 34.93 kg/m2 34.57 kg/m2 34.29 kg/m2

## 2019-09-17 ENCOUNTER — Encounter: Payer: Self-pay | Admitting: Family Medicine

## 2019-09-17 NOTE — Assessment & Plan Note (Signed)
  Patient re-educated about  the importance of commitment to a  minimum of 150 minutes of exercise per week as able.  The importance of healthy food choices with portion control discussed, as well as eating regularly and within a 12 hour window most days. The need to choose "clean , green" food 50 to 75% of the time is discussed, as well as to make water the primary drink and set a goal of 64 ounces water daily.    Weight /BMI 09/16/2019 07/29/2019 07/02/2019  WEIGHT 191 lb 189 lb 187 lb 8 oz  HEIGHT 5\' 2"  5\' 2"  5\' 2"   BMI 34.93 kg/m2 34.57 kg/m2 34.29 kg/m2

## 2019-09-17 NOTE — Assessment & Plan Note (Addendum)
Managed with personal psychotherapy, improved , states she was intolerant of buspar, continue celexa 40 mg daily

## 2019-09-17 NOTE — Assessment & Plan Note (Signed)
Uncontrolled, non compliant, re educated re importance of changing this DASH diet and commitment to daily physical activity for a minimum of 30 minutes discussed and encouraged, as a part of hypertension management. The importance of attaining a healthy weight is also discussed.  BP/Weight 09/16/2019 07/29/2019 07/02/2019 04/17/2019 12/10/2018 12/06/2018 [id number]  Systolic BP 123456 123456 0000000 - Q000111Q 123456 -  Diastolic BP 94 86 91 - 90 82 -  Wt. (Lbs) 191 189 187.5 184 180 182 182  BMI 34.93 34.57 34.29 34.2 32.92 33.29 33.83

## 2019-09-17 NOTE — Assessment & Plan Note (Signed)
Hyperlipidemia:Low fat diet discussed and encouraged.   Lipid Panel  Lab Results  Component Value Date   CHOL 182 08/01/2019   HDL 55 08/01/2019   LDLCALC 102 (H) 08/01/2019   TRIG 123 08/01/2019   CHOLHDL 3.3 08/01/2019   Encouraged to reduce fat intake as LDL slightly elevated

## 2019-09-17 NOTE — Assessment & Plan Note (Signed)
Managed by Endo, she has deteriorated, medication adjustment and sooner appt is being arranged Alicia Neal is reminded of the importance of commitment to daily physical activity for 30 minutes or more, as able and the need to limit carbohydrate intake to 30 to 60 grams per meal to help with blood sugar control.   The need to take medication as prescribed, test blood sugar as directed, and to call between visits if there is a concern that blood sugar is uncontrolled is also discussed.   Alicia Neal is reminded of the importance of daily foot exam, annual eye examination, and good blood sugar, blood pressure and cholesterol control.  Diabetic Labs Latest Ref Rng & Units 08/01/2019 04/04/2019 11/29/2018 06/08/2018 03/09/2018  HbA1c 4.8 - 5.6 % 8.0(H) 7.4(H) 6.7(H) 7.4(H) 8.0(H)  Microalbumin Not Estab. ug/mL 26.1(H) - - 7.8(H) -  Micro/Creat Ratio 0 - 29 mg/g creat 22 - - 19.9 -  Chol 0 - 200 mg/dL 182 - 258(H) 167 229(H)  HDL >40 mg/dL 55 - 56 47 54  Calc LDL 0 - 99 mg/dL 102(H) - 162(H) 96 147(H)  Triglycerides <150 mg/dL 123 - 202(H) 122 140  Creatinine 0.44 - 1.00 mg/dL 0.60 0.61 0.68 0.71 0.64   BP/Weight 09/16/2019 07/29/2019 07/02/2019 04/17/2019 12/10/2018 12/06/2018 [id number]  Systolic BP 123456 123456 0000000 - Q000111Q 123456 -  Diastolic BP 94 86 91 - 90 82 -  Wt. (Lbs) 191 189 187.5 184 180 182 182  BMI 34.93 34.57 34.29 34.2 32.92 33.29 33.83   Foot/eye exam completion dates Latest Ref Rng & Units 07/29/2019 11/02/2018  Eye Exam No Retinopathy - No Retinopathy  Foot Form Completion - Done -

## 2019-09-23 ENCOUNTER — Ambulatory Visit (HOSPITAL_COMMUNITY)
Admission: RE | Admit: 2019-09-23 | Discharge: 2019-09-23 | Disposition: A | Discharge status: Home / Self Care | Payer: 59 | Source: Ambulatory Visit | Attending: Acute Care | Admitting: Acute Care

## 2019-09-23 ENCOUNTER — Other Ambulatory Visit: Payer: Self-pay

## 2019-09-23 DIAGNOSIS — Z87891 Personal history of nicotine dependence: Secondary | ICD-10-CM | POA: Diagnosis not present

## 2019-09-23 DIAGNOSIS — Z122 Encounter for screening for malignant neoplasm of respiratory organs: Secondary | ICD-10-CM | POA: Diagnosis not present

## 2019-09-26 ENCOUNTER — Encounter: Payer: Self-pay | Admitting: "Endocrinology

## 2019-09-26 ENCOUNTER — Ambulatory Visit (INDEPENDENT_AMBULATORY_CARE_PROVIDER_SITE_OTHER): Payer: 59 | Admitting: "Endocrinology

## 2019-09-26 ENCOUNTER — Other Ambulatory Visit: Payer: Self-pay

## 2019-09-26 DIAGNOSIS — I10 Essential (primary) hypertension: Secondary | ICD-10-CM

## 2019-09-26 DIAGNOSIS — E1165 Type 2 diabetes mellitus with hyperglycemia: Secondary | ICD-10-CM

## 2019-09-26 DIAGNOSIS — E782 Mixed hyperlipidemia: Secondary | ICD-10-CM

## 2019-09-26 NOTE — Progress Notes (Signed)
09/26/2019, 9:58 AM                                                     Endocrinology Telehealth Visit Follow up Note -During COVID -19 Pandemic  This visit type was conducted due to national recommendations for restrictions regarding the COVID-19 Pandemic  in an effort to limit this patient's exposure and mitigate transmission of the corona virus.  Due to her co-morbid illnesses, Alicia Neal is at  moderate to high risk for complications without adequate follow up.  This format is felt to be most appropriate for her at this time.  I connected with this patient on 09/26/2019   by telephone and verified that I am speaking with the correct person using two identifiers. Alicia Neal, [birth year]. she has verbally consented to this visit. All issues noted in this document were discussed and addressed. The format was not optimal for physical exam.    Subjective:    Patient ID: Alicia Neal, female    DOB: 04/02/1961.  Alicia Neal is being engaged in telehealth via telephone in follow-up for management of currently uncontrolled symptomatic diabetes requested by  Fayrene Helper, MD.   Past Medical History:  Diagnosis Date  . Adrenal adenoma    right side  . Allergic rhinitis   . Anxiety disorder   . Carpal tunnel syndrome of left wrist   . Chronic depression    Dr. Cheryln Manly  . GERD (gastroesophageal reflux disease)   . History of abnormal cervical Pap smear   . History of adenomatous polyp of colon   . History of hiatal hernia   . Hyperlipidemia   . Hypertension   . IBS (irritable bowel syndrome)   . OSA (obstructive sleep apnea)    per pt study yrs ago-- CPAP intolerant   . Pemphigoid, cicatricial    pruritic autoimmune blistering skin disorder  . PONV (postoperative nausea and vomiting)   . Pulmonary nodule, right pulmologist-  dr Nathaneil Canary mcquaid   x2    per CT 09-11-2015  . Trigger finger, left    ring finger  . Type 2 diabetes  mellitus ([place])   . Uncontrolled type 2 diabetes mellitus with hyperglycemia ([place]) 04/17/2018  . Wears glasses    Past Surgical History:  Procedure Laterality Date  . CARDIOVASCULAR STRESS TEST  04-30-2003   Small focus of decreased perfusion on rest images in the mid-distal anterior wall (worrisome for ischemia),  normal LV function and wall motion , ef 60%  . CARPAL TUNNEL RELEASE Left 11/26/2015   Procedure: CARPAL TUNNEL RELEASE;  Surgeon: Iran Planas, MD;  Location: [facility];  Service: Orthopedics;  Laterality: Left;  . CHOLECYSTECTOMY  1989  . TRANSTHORACIC ECHOCARDIOGRAM  04-30-2003   mild hypokinetic in the inferior and posterior wall, ef 50%/  trivial MR /  mild TR  . TRIGGER FINGER RELEASE Left 11/26/2015   Procedure: RELEASE TRIGGER FINGER/A-1 PULLEY LEFT RING  FINGER;  Surgeon: Iran Planas, MD;  Location: [facility];  Service: Orthopedics;  Laterality: Left;  . TUBAL LIGATION  2000   Social History   Socioeconomic History  . Marital status: Single    Spouse name: Not on file  . Number of children: 1  . Years of education:  Not on file  . Highest education level: Not on file  Occupational History  . Not on file  Social Needs  . Financial resource strain: Not on file  . Food insecurity    Worry: Not on file    Inability: Not on file  . Transportation needs    Medical: Not on file    Non-medical: Not on file  Tobacco Use  . Smoking status: Former Smoker    Packs/day: 1.00    Years: 40.00    Pack years: 40.00    Types: Cigarettes    Quit date: 11/07/2013    Years since quitting: 5.8  . Smokeless tobacco: Never Used  Substance and Sexual Activity  . Alcohol use: Yes    Alcohol/week: 0.0 standard drinks    Comment: occasional  . Drug use: No  . Sexual activity: Not Currently    Birth control/protection: Post-menopausal  Lifestyle  . Physical activity    Days per week: Not on file    Minutes per session: Not on file  . Stress: Not on file  Relationships  . Social  Herbalist on phone: Not on file    Gets together: Not on file    Attends religious service: Not on file    Active member of club or organization: Not on file    Attends meetings of clubs or organizations: Not on file    Relationship status: Not on file  Other Topics Concern  . Not on file  Social History Narrative  . Not on file   Outpatient Encounter Medications as of 09/26/2019  Medication Sig  . Accu-Chek FastClix Lancets MISC USE TO CHECK BLOOD SUGAR ONCE A DAY  . Cholecalciferol (VITAMIN D3 PO) Take by mouth as needed.  . citalopram (CELEXA) 40 MG tablet Take 1 tablet (40 mg total) by mouth daily.  . Dulaglutide (TRULICITY) 1.5 0000000 SOPN INJECT 1.5 MG INTO THE SKIN ONCE A WEEK.  Marland Kitchen glucose blood (ACCU-CHEK GUIDE) test strip USE TO CHECK BLOOD SUGAR TWICE DAILY  . insulin degludec (TRESIBA FLEXTOUCH) 100 UNIT/ML SOPN FlexTouch Pen Inject 0.2 mLs (20 Units total) into the skin daily.  . Insulin Pen Needle (B-D ULTRAFINE III SHORT PEN) 31G X 8 MM MISC 1 each by Does not apply route as directed.  Marland Kitchen losartan (COZAAR) 100 MG tablet Take 1 tablet (100 mg total) by mouth daily.  . metFORMIN (GLUCOPHAGE) 1000 MG tablet TAKE 1 TABLET (1,000 MG TOTAL) BY MOUTH 2 TIMES DAILY WITH A MEAL.  . mycophenolate (CELLCEPT) 500 MG tablet Take 500 mg by mouth daily.   . pantoprazole (PROTONIX) 40 MG tablet TAKE 1 TABLET (40 MG TOTAL) BY MOUTH DAILY.  Marland Kitchen REPATHA SURECLICK [id number] MG/ML SOAJ INJECT 140 MG INTO THE SKIN EVERY 14 (FOURTEEN) DAYS.   No facility-administered encounter medications on file as of 09/26/2019.     ALLERGIES: Allergies  Allergen Reactions  . Ezetimibe-Simvastatin Other (See Comments)    REACTION: muscle aches  . Pravachol [Pravastatin Sodium] Other (See Comments)    Joint pains and memory loss  . Simvastatin Other (See Comments)    REACTION: muscle aches  . Zetia [Ezetimibe] Other (See Comments)    Generalized joint pains    VACCINATION STATUS: Immunization  History  Administered Date(s) Administered  . Influenza Split 07/19/2013, 07/22/2014  . Pneumococcal Conjugate-13 06/25/2014  . Pneumococcal Polysaccharide-23 07/29/2019  . Tdap 06/25/2014  . Zoster Recombinat (Shingrix) 06/12/2018    Diabetes She presents for her follow-up diabetic  visit. She has type 2 diabetes mellitus. Onset time: She was diagnosed at approximate age of 53 years. Her disease course has been improving (She reports A1c of 13% at the time of diagnosis, with subsequent improvement to near target range and recent reverse to 8% in May 2019.). Pertinent negatives for hypoglycemia include no confusion, headaches, hunger, pallor or seizures. Pertinent negatives for diabetes include no chest pain, no fatigue, no polydipsia, no polyphagia and no polyuria. There are no hypoglycemic complications. Symptoms are improving. There are no diabetic complications. Risk factors for coronary artery disease include diabetes mellitus, dyslipidemia, hypertension, tobacco exposure and post-menopausal. Current diabetic treatments: She is currently on glipizide 5 mg p.o. twice daily and Janumet 50/1000 mg p.o. twice daily. She is following a generally unhealthy diet. When asked about meal planning, she reported none. She has not had a previous visit with a dietitian. She participates in exercise intermittently. Her home blood glucose trend is decreasing steadily. Her breakfast blood glucose range is generally 130-140 mg/dl. Her bedtime blood glucose range is generally 180-200 mg/dl. Her overall blood glucose range is 140-180 mg/dl. An ACE inhibitor/angiotensin II receptor blocker is being taken. Eye exam is current.  Hyperlipidemia This is a chronic problem. The current episode started more than 1 year ago. The problem is uncontrolled. Recent lipid tests were reviewed and are high. Exacerbating diseases include diabetes. Pertinent negatives include no chest pain, myalgias or shortness of breath. Treatments  tried: Patient is not tolerating statins including Livalo. Risk factors for coronary artery disease include dyslipidemia, diabetes mellitus, hypertension and post-menopausal.  Hypertension This is a chronic problem. The current episode started more than 1 year ago. The problem is uncontrolled. Pertinent negatives include no chest pain, headaches, palpitations or shortness of breath. Risk factors for coronary artery disease include dyslipidemia, diabetes mellitus and smoking/tobacco exposure. Past treatments include angiotensin blockers.   Review of systems: Limited as above.   Objective:    There were no vitals taken for this visit.  Wt Readings from Last 3 Encounters:  09/16/19 191 lb (86.6 kg)  07/29/19 189 lb (85.7 kg)  07/02/19 187 lb 8 oz (85 kg)     CMP ( most recent) CMP     Component Value Date/Time   NA 137 08/01/2019 1000   K 4.3 08/01/2019 1000   CL 101 08/01/2019 1000   CO2 27 08/01/2019 1000   GLUCOSE 141 (H) 08/01/2019 1000   BUN 14 08/01/2019 1000   CREATININE 0.60 08/01/2019 1000   CREATININE 0.84 11/18/2016 0844   CALCIUM 8.9 08/01/2019 1000   PROT 7.2 08/01/2019 1000   ALBUMIN 4.3 08/01/2019 1000   AST 39 08/01/2019 1000   ALT 54 (H) 08/01/2019 1000   ALKPHOS 53 08/01/2019 1000   BILITOT 0.6 08/01/2019 1000   GFRNONAA >60 08/01/2019 1000   GFRNONAA 77 11/18/2016 0844   GFRAA >60 08/01/2019 1000   GFRAA 89 11/18/2016 0844     Diabetic Labs (most recent): Lab Results  Component Value Date   HGBA1C 8.0 (H) 08/01/2019   HGBA1C 7.4 (H) 04/04/2019   HGBA1C 6.7 (H) 11/29/2018     Lipid Panel ( most recent) Lipid Panel     Component Value Date/Time   CHOL 182 08/01/2019 1000   TRIG 123 08/01/2019 1000   HDL 55 08/01/2019 1000   CHOLHDL 3.3 08/01/2019 1000   VLDL 25 08/01/2019 1000   LDLCALC 102 (H) 08/01/2019 1000      Lab Results  Component Value Date  TSH 1.483 08/01/2019   TSH 1.601 06/08/2018   TSH 1.724 06/08/2017   TSH 1.620  06/08/2015   TSH 1.739 06/23/2014   TSH 1.659 04/15/2013   TSH 1.634 01/11/2012   TSH 1.344 11/04/2010   TSH 1.349 10/16/2009   TSH 2.028 01/25/2008   FREET4 0.92 06/08/2018      Assessment & Plan:   1. Uncontrolled type 2 diabetes mellitus with hyperglycemia ([place])  - Alicia Neal has currently uncontrolled symptomatic type 2 DM since 58 years of age. -She was initiated on her basal insulin due to loss of control of glycemia.  She responded very well and reports blood glucose ranged from 110-130 fasting, less than 240 postprandial.    -Her recent A1c was increasing to 8%   -She does not report any gross complications from her diabetes, howeverMonique H Neal remains at a high risk for acute and chronic complications of type 2 diabetes which include CAD, CVA, CKD, retinopathy, and neuropathy. These are all discussed in l with the patient.  - I have counseled her on diet management and weight loss, by adopting a carbohydrate restricted/protein rich diet.  - she  admits there is a room for improvement in her diet and drink choices. -  Suggestion is made for her to avoid simple carbohydrates  from her diet including Cakes, Sweet Desserts / Pastries, Ice Cream, Soda (diet and regular), Sweet Tea, Candies, Chips, Cookies, Sweet Pastries,  Store Bought Juices, Alcohol in Excess of  1-2 drinks a day, Artificial Sweeteners, Coffee Creamer, and "Sugar-free" Products. This will help patient to have stable blood glucose profile and potentially avoid unintended weight gain.  - I encouraged her to switch to  unprocessed or minimally processed complex starch and increased protein intake (animal or plant source), fruits, and vegetables.  - she is advised to stick to a routine mealtimes to eat 3 meals  a day and avoid unnecessary snacks ( to snack only to correct hypoglycemia).   - she is scheduled  with Jearld Fenton, RDN, CDE for individualized diabetes education.  - I have approached her  with the following individualized plan to manage diabetes and patient agrees:   -She has responded to the initiation of basal insulin with controlled fasting glycemic profile.  She is advised to continue Tresiba 20 units nightly,   associated with monitoring of blood glucose 2 times a day-daily before breakfast and at bedtime and phone visit in 10 days with her logs.    She is advised to continue metformin 1000 mg p.o. twice daily, and Trulicity 1.5 mg subcutaneously weekly.  - Patient specific target  A1c;  LDL, HDL, Triglycerides, and  Waist Circumference were discussed in detail.  2) BP/HTN: Her blood pressure is controlled to target.  She has mild microalbuminuria.  He is advised to continue losartan to 50 mg p.o. daily.  3) Lipids/HPL:   Her recent lipid panel showed significantly improved LDL at 102 from 162.  She has benefited from [place], advised to continue Repatha 140 mg subcutaneously every other week.   she is advised to continue the Livalo 2 mg p.o. nightly.  4)  Weight/Diet: She reports some weight gain, CDE Consult has been  initiated , exercise, and detailed carbohydrates information provided.  5) Chronic Care/Health Maintenance:  -she  is on ACEI/ARB and Statin medications and  is encouraged to continue to follow up with Ophthalmology, Dentist,  Podiatrist at least yearly or according to recommendations, and advised to  stay away from  smoking. I have recommended yearly flu vaccine and pneumonia vaccination at least every 5 years; moderate intensity exercise for up to 150 minutes weekly; and  sleep for at least 7 hours a day.  - I advised patient to maintain close follow up with Fayrene Helper, MD for primary care needs.   Time for this visit: 15 minutes.  -Discussed her blood glucose ranges. Alicia Neal  participated in the discussions, expressed understanding, and voiced agreement with the above plans.  All questions were answered to her satisfaction. she is  encouraged to contact clinic should she have any questions or concerns prior to her return visit.   Follow up plan: - Return in about 9 weeks (around 11/28/2019) for Bring Meter and Logs- A1c in Office, Include 8 log sheets.  Glade Lloyd, MD [facility] [address] [place], [place] [address] Phone: [phone]  Fax: [phone]    09/26/2019, 9:58 AM  This note was partially dictated with voice recognition software. Similar sounding words can be transcribed inadequately or may not  be corrected upon review.

## 2019-09-28 MED FILL — MYCOPHENOLATE 500 MG TABLET: 500 | 90 days supply | Qty: 90 | Fill #3

## 2019-09-28 MED FILL — LOSARTAN POTASSIUM 100 MG T: 100 | 30 days supply | Qty: 30 | Fill #1

## 2019-10-07 ENCOUNTER — Other Ambulatory Visit: Payer: Self-pay | Admitting: *Deleted

## 2019-10-07 ENCOUNTER — Other Ambulatory Visit: Payer: Self-pay | Admitting: "Endocrinology

## 2019-10-07 DIAGNOSIS — Z87891 Personal history of nicotine dependence: Secondary | ICD-10-CM

## 2019-10-07 DIAGNOSIS — Z122 Encounter for screening for malignant neoplasm of respiratory organs: Secondary | ICD-10-CM

## 2019-10-07 MED FILL — ACCU-CHEK GUIDE TEST STRIP: 90 days supply | Qty: 200 | Fill #1

## 2019-10-07 MED FILL — metFORMIN HCL 1000 MG TABS: 1000 | 90 days supply | Qty: 180 | Fill #0

## 2019-10-07 MED FILL — ACCU-CHEK FASTCLIX LANCETS: 90 days supply | Qty: 102 | Fill #1

## 2019-10-10 ENCOUNTER — Ambulatory Visit: Payer: 59 | Admitting: Nutrition

## 2019-10-10 ENCOUNTER — Ambulatory Visit: Payer: 59 | Admitting: "Endocrinology

## 2019-10-10 ENCOUNTER — Telehealth: Payer: Self-pay | Admitting: Nutrition

## 2019-10-10 NOTE — Telephone Encounter (Signed)
vm left to let her know we need to do a phone viist instead of office visit.

## 2019-10-17 ENCOUNTER — Other Ambulatory Visit: Payer: Self-pay | Admitting: "Endocrinology

## 2019-10-17 MED FILL — VITAMIN D 125 MCG (5000 UT): 125 MCG | 100 days supply | Qty: 100 | Fill #0

## 2019-10-17 MED FILL — TRULICITY 1.5 MG/0.5 ML PEN: 1.5 | 28 days supply | Qty: 2 | Fill #1

## 2019-10-30 ENCOUNTER — Other Ambulatory Visit (HOSPITAL_COMMUNITY): Payer: Self-pay | Admitting: Family Medicine

## 2019-10-30 DIAGNOSIS — Z1231 Encounter for screening mammogram for malignant neoplasm of breast: Secondary | ICD-10-CM

## 2019-11-02 MED FILL — LOSARTAN POTASSIUM 100 MG T: 100 | 30 days supply | Qty: 30 | Fill #2

## 2019-11-04 ENCOUNTER — Ambulatory Visit (HOSPITAL_COMMUNITY)
Admission: RE | Admit: 2019-11-04 | Discharge: 2019-11-04 | Disposition: A | Discharge status: Home / Self Care | Payer: 59 | Source: Ambulatory Visit | Attending: Family Medicine | Admitting: Family Medicine

## 2019-11-04 ENCOUNTER — Other Ambulatory Visit: Payer: Self-pay

## 2019-11-04 DIAGNOSIS — Z1231 Encounter for screening mammogram for malignant neoplasm of breast: Secondary | ICD-10-CM | POA: Diagnosis not present

## 2019-11-13 ENCOUNTER — Encounter: Payer: Self-pay | Admitting: Family Medicine

## 2019-11-13 ENCOUNTER — Ambulatory Visit (INDEPENDENT_AMBULATORY_CARE_PROVIDER_SITE_OTHER): Payer: 59 | Admitting: Family Medicine

## 2019-11-13 ENCOUNTER — Encounter: Payer: Self-pay | Admitting: Gastroenterology

## 2019-11-13 ENCOUNTER — Other Ambulatory Visit: Payer: Self-pay

## 2019-11-13 VITALS — BP 148/94 | Ht 62.0 in | Wt 192.0 lb

## 2019-11-13 DIAGNOSIS — E782 Mixed hyperlipidemia: Secondary | ICD-10-CM

## 2019-11-13 DIAGNOSIS — Z1211 Encounter for screening for malignant neoplasm of colon: Secondary | ICD-10-CM

## 2019-11-13 DIAGNOSIS — E1159 Type 2 diabetes mellitus with other circulatory complications: Secondary | ICD-10-CM

## 2019-11-13 DIAGNOSIS — Z79899 Other long term (current) drug therapy: Secondary | ICD-10-CM

## 2019-11-13 DIAGNOSIS — F411 Generalized anxiety disorder: Secondary | ICD-10-CM

## 2019-11-13 DIAGNOSIS — Z6835 Body mass index (BMI) 35.0-35.9, adult: Secondary | ICD-10-CM | POA: Diagnosis not present

## 2019-11-13 DIAGNOSIS — E785 Hyperlipidemia, unspecified: Secondary | ICD-10-CM

## 2019-11-13 DIAGNOSIS — I10 Essential (primary) hypertension: Secondary | ICD-10-CM | POA: Diagnosis not present

## 2019-11-13 MED ORDER — CITALOPRAM HYDROBROMIDE 20 MG PO TABS: 20.0000 mg | ORAL_TABLET | Freq: Every day | ORAL | 3 refills | Status: DC

## 2019-11-13 MED FILL — TRULICITY 1.5 MG/0.5 ML PEN: 1.5 | 28 days supply | Qty: 2 | Fill #2

## 2019-11-13 MED FILL — CITALOPRAM HBR 20 MG TABLET: 20 | 90 days supply | Qty: 90 | Fill #0

## 2019-11-13 NOTE — Progress Notes (Signed)
Virtual Visit via Telephone Note  I connected with Alicia Neal on 11/13/19 at  8:40 AM EST by telephone and verified that I am speaking with the correct person using two identifiers.  Location: Patient: home Provider: office   I discussed the limitations, risks, security and privacy concerns of performing an evaluation and management service by telephone and the availability of in person appointments. I also discussed with the patient that there may be a patient responsible charge related to this service. The patient expressed understanding and agreed to proceed.   History of Present Illness: F/u chronic problems, medication and lab review, and update preventive health Alicia Neal  Recently had her Covid vaccine and for the initial 48 hrs had reaction as though she was having acute infection. Stopped her cellcept 2 days before vaccine, will d/c for 10 days befoe 2nd vaccine . She has had Covid in the Summer and is  Familiar with the symptoms Reports improved blood sugar now that she is on insulin, FBG generally under 130 Denies polyuria, polydipsia, blurred vision , or hypoglycemic episodes. Has re commited to self care and is better handling the stress of her position Increased time spent in exercise and spituality prior to starting each day, she has also reduced SSRI dose and feels better , still has a lot of anxiety, denies depression Denies recent fever or chills. Denies sinus pressure, nasal congestion, ear pain or sore throat. Denies chest congestion, productive cough or wheezing. Denies chest pains, palpitations and leg swelling Denies abdominal pain, nausea, vomiting,diarrhea or constipation.   Denies dysuria, frequency, hesitancy or incontinence. Denies joint pain, swelling and limitation in mobility. Denies headaches, seizures, numbness, or tingling. Denies uncontrolled  depression, anxiety or insomnia. Denies skin break down or rash.       Observations/Objective: BP (!)  148/94   Ht 5\' 2"  (1.575 m)   Wt 192 lb (87.1 kg)   BMI 35.12 kg/m  Good communication with no confusion and intact memory. Alert and oriented x 3 No signs of respiratory distress during speech    Assessment and Plan:  HTN, goal below 130/80 Uncontrolled , needs Nurse visit next week to determine need for medication change, with closer follow up DASH diet and commitment to daily physical activity for a minimum of 30 minutes discussed and encouraged, as a part of hypertension management. The importance of attaining a healthy weight is also discussed.  BP/Weight 11/13/2019 09/16/2019 07/29/2019 07/02/2019 04/17/2019 12/10/2018 Q000111Q  Systolic BP 123456 123456 123456 0000000 - Q000111Q 123456  Diastolic BP 94 94 86 91 - 90 82  Wt. (Lbs) 192 191 189 187.5 184 180 182  BMI 35.12 34.93 34.57 34.29 34.2 32.92 33.29       Type 2 diabetes mellitus with vascular disease ([facility]) Reports improvement , uncontrolled when last checked Alicia Neal is reminded of the importance of commitment to daily physical activity for 30 minutes or more, as able and the need to limit carbohydrate intake to 30 to 60 grams per meal to help with blood sugar control.   The need to take medication as prescribed, test blood sugar as directed, and to call between visits if there is a concern that blood sugar is uncontrolled is also discussed.   Alicia Neal is reminded of the importance of daily foot exam, annual eye examination, and good blood sugar, blood pressure and cholesterol control.  Diabetic Labs Latest Ref Rng & Units 08/01/2019 04/04/2019 11/29/2018 06/08/2018 03/09/2018  HbA1c 4.8 - 5.6 % 8.0(H) 7.4(H)  6.7(H) 7.4(H) 8.0(H)  Microalbumin Not Estab. ug/mL 26.1(H) - - 7.8(H) -  Micro/Creat Ratio 0 - 29 mg/g creat 22 - - 19.9 -  Chol 0 - 200 mg/dL 182 - 258(H) 167 229(H)  HDL >40 mg/dL 55 - 56 47 54  Calc LDL 0 - 99 mg/dL 102(H) - 162(H) 96 147(H)  Triglycerides <150 mg/dL 123 - 202(H) 122 140  Creatinine 0.44 - 1.00 mg/dL 0.60 0.61 0.68  0.71 0.64   BP/Weight 11/13/2019 09/16/2019 07/29/2019 07/02/2019 04/17/2019 12/10/2018 [id number]  Systolic BP 123456 123456 123456 0000000 - Q000111Q 123456  Diastolic BP 94 94 86 91 - 90 82  Wt. (Lbs) 192 191 189 187.5 184 180 182  BMI 35.12 34.93 34.57 34.29 34.2 32.92 33.29   Foot/eye exam completion dates Latest Ref Rng & Units 07/29/2019 11/02/2018  Eye Exam No Retinopathy - No Retinopathy  Foot Form Completion - Done -   Managed by endo     Hyperlipidemia LDL goal <100 Hyperlipidemia:Low fat diet discussed and encouraged.   Lipid Panel  Lab Results  Component Value Date   CHOL 182 08/01/2019   HDL 55 08/01/2019   LDLCALC 102 (H) 08/01/2019   TRIG 123 08/01/2019   CHOLHDL 3.3 08/01/2019   Improved, still needs to lower fat intake    GAD (generalized anxiety disorder) Improved now on lower medication dose. More attention to self care and holistic approach to health  Morbid obesity ([place]) Obesity linked with hypertension and diabetes  Patient re-educated about  the importance of commitment to a  minimum of 150 minutes of exercise per week as able.  The importance of healthy food choices with portion control discussed, as well as eating regularly and within a 12 hour window most days. The need to choose "clean , green" food 50 to 75% of the time is discussed, as well as to make water the primary drink and set a goal of 64 ounces water daily.    Weight /BMI 11/13/2019 09/16/2019 07/29/2019  WEIGHT 192 lb 191 lb 189 lb  HEIGHT 5\' 2"  5\' 2"  5\' 2"   BMI 35.12 kg/m2 34.93 kg/m2 34.57 kg/m2       Follow Up Instructions:    I discussed the assessment and treatment plan with the patient. The patient was provided an opportunity to ask questions and all were answered. The patient agreed with the plan and demonstrated an understanding of the instructions.   The patient was advised to call back or seek an in-person evaluation if the symptoms worsen or if the condition fails to improve as  anticipated.  I provided 22 minutes of non-face-to-face time during this encounter.   Tula Nakayama, MD

## 2019-11-13 NOTE — Patient Instructions (Addendum)
Nurse Blood pressure check in office next week Tuesday early am to go along with this visit  MD f/u in office in 3 months, call if you need me sooner  Thankful that you are better overall, continue new changes in health habits  You are referred to [facility] for screening colonoscopy  Fasting lipid panel, cmp and EGFr and CBC 1 week before April follow up  It is important that you exercise regularly at least 30 minutes 5 times a week. If you develop chest pain, have severe difficulty breathing, or feel very tired, stop exercising immediately and seek medical attention   Think about what you will eat, plan ahead. Choose " clean, green, fresh or frozen" over canned, processed or packaged foods which are more sugary, salty and fatty. 70 to 75% of food eaten should be vegetables and fruit. Three meals at set times with snacks allowed between meals, but they must be fruit or vegetables. Aim to eat over a 12 hour period , example 7 am to 7 pm, and STOP after  your last meal of the day. Drink water,generally about 64 ounces per day, no other drink is as healthy. Fruit juice is best enjoyed in a healthy way, by EATING the fruit.   Thanks for choosing [facility], we consider it a privelige to serve you.

## 2019-11-16 ENCOUNTER — Encounter: Payer: Self-pay | Admitting: Family Medicine

## 2019-11-16 MED FILL — REPATHA SURECLICK 140 MG/ML: 140 | 28 days supply | Qty: 2 | Fill #1

## 2019-11-16 NOTE — Assessment & Plan Note (Signed)
Improved now on lower medication dose. More attention to self care and holistic approach to health

## 2019-11-16 NOTE — Assessment & Plan Note (Signed)
Reports improvement , uncontrolled when last checked Alicia Neal is reminded of the importance of commitment to daily physical activity for 30 minutes or more, as able and the need to limit carbohydrate intake to 30 to 60 grams per meal to help with blood sugar control.   The need to take medication as prescribed, test blood sugar as directed, and to call between visits if there is a concern that blood sugar is uncontrolled is also discussed.   Alicia Neal is reminded of the importance of daily foot exam, annual eye examination, and good blood sugar, blood pressure and cholesterol control.  Diabetic Labs Latest Ref Rng & Units 08/01/2019 04/04/2019 11/29/2018 06/08/2018 03/09/2018  HbA1c 4.8 - 5.6 % 8.0(H) 7.4(H) 6.7(H) 7.4(H) 8.0(H)  Microalbumin Not Estab. ug/mL 26.1(H) - - 7.8(H) -  Micro/Creat Ratio 0 - 29 mg/g creat 22 - - 19.9 -  Chol 0 - 200 mg/dL 182 - 258(H) 167 229(H)  HDL >40 mg/dL 55 - 56 47 54  Calc LDL 0 - 99 mg/dL 102(H) - 162(H) 96 147(H)  Triglycerides <150 mg/dL 123 - 202(H) 122 140  Creatinine 0.44 - 1.00 mg/dL 0.60 0.61 0.68 0.71 0.64   BP/Weight 11/13/2019 09/16/2019 07/29/2019 07/02/2019 04/17/2019 12/10/2018 [id number]  Systolic BP 123456 123456 123456 0000000 - Q000111Q 123456  Diastolic BP 94 94 86 91 - 90 82  Wt. (Lbs) 192 191 189 187.5 184 180 182  BMI 35.12 34.93 34.57 34.29 34.2 32.92 33.29   Foot/eye exam completion dates Latest Ref Rng & Units 07/29/2019 11/02/2018  Eye Exam No Retinopathy - No Retinopathy  Foot Form Completion - Done -   Managed by endo

## 2019-11-16 NOTE — Assessment & Plan Note (Signed)
Obesity linked with hypertension and diabetes  Patient re-educated about  the importance of commitment to a  minimum of 150 minutes of exercise per week as able.  The importance of healthy food choices with portion control discussed, as well as eating regularly and within a 12 hour window most days. The need to choose "clean , green" food 50 to 75% of the time is discussed, as well as to make water the primary drink and set a goal of 64 ounces water daily.    Weight /BMI 11/13/2019 09/16/2019 07/29/2019  WEIGHT 192 lb 191 lb 189 lb  HEIGHT 5\' 2"  5\' 2"  5\' 2"   BMI 35.12 kg/m2 34.93 kg/m2 34.57 kg/m2

## 2019-11-16 NOTE — Assessment & Plan Note (Signed)
Hyperlipidemia:Low fat diet discussed and encouraged.   Lipid Panel  Lab Results  Component Value Date   CHOL 182 08/01/2019   HDL 55 08/01/2019   LDLCALC 102 (H) 08/01/2019   TRIG 123 08/01/2019   CHOLHDL 3.3 08/01/2019   Improved, still needs to lower fat intake

## 2019-11-16 NOTE — Assessment & Plan Note (Signed)
Uncontrolled , needs Nurse visit next week to determine need for medication change, with closer follow up DASH diet and commitment to daily physical activity for a minimum of 30 minutes discussed and encouraged, as a part of hypertension management. The importance of attaining a healthy weight is also discussed.  BP/Weight 11/13/2019 09/16/2019 07/29/2019 07/02/2019 04/17/2019 12/10/2018 [id number]  Systolic BP 123456 123456 123456 0000000 - Q000111Q 123456  Diastolic BP 94 94 86 91 - 90 82  Wt. (Lbs) 192 191 189 187.5 184 180 182  BMI 35.12 34.93 34.57 34.29 34.2 32.92 33.29

## 2019-11-19 ENCOUNTER — Telehealth: Payer: Self-pay | Admitting: Family Medicine

## 2019-11-19 ENCOUNTER — Other Ambulatory Visit: Payer: Self-pay

## 2019-11-19 ENCOUNTER — Ambulatory Visit: Payer: 59

## 2019-11-19 DIAGNOSIS — I10 Essential (primary) hypertension: Secondary | ICD-10-CM

## 2019-11-19 MED ORDER — AMLODIPINE BESYLATE 2.5 MG PO TABS: ORAL_TABLET | ORAL | 3 refills | Status: DC

## 2019-11-19 MED ORDER — AMLODIPINE BESYLATE 5 MG PO TABS: ORAL_TABLET | ORAL | 3 refills | Status: DC

## 2019-11-19 MED FILL — AMLODIPINE 2.5 MG TABLET: 2.5 | 90 days supply | Qty: 90 | Fill #0

## 2019-11-19 MED FILL — AMLODIPINE BESYLATE 5 MG TA: 5 | 90 days supply | Qty: 90 | Fill #0

## 2019-11-19 NOTE — Addendum Note (Signed)
Addended by: Fayrene Helper on: 11/19/2019 08:53 AM   Modules accepted: Orders

## 2019-11-19 NOTE — Progress Notes (Signed)
Pt comes in today for bp check. bp this morning in office was 168/88 11/14/19 am 178/98   Pm137/69 11/15/19    189/103 11/16/19    180/100    PM 159/87 11/17/19  189/94 11/18/19  194/116 11/19/19  204/105

## 2019-11-19 NOTE — Progress Notes (Signed)
Needs to start amlodipine 2.5 mg one tablet tin am and amlodipine 5 mg one tablet in evening. , continue losartan as before  Needs office re eval with MD for bP re eval in 4 to 6 weeks, please also schedule and let her know.   I am sending in the amlodipine today  Please advise to take the tablets 12 hours apart , so if morning dose is at 7 am take evening at 7  Pm, she should be doing this for the losartan also which she takes twice daily. This makes blood pressure control better when twice aily dosing is 12 hours apart

## 2019-11-19 NOTE — Telephone Encounter (Signed)
Please  see addendum to nurse visit this morning re blood pressure management and discuss with pt and follow through, thanks

## 2019-11-19 NOTE — Telephone Encounter (Signed)
Advised patient of new treatment plan with verbal understanding. Scheduled follow up appt for 2/23 at 8:40am

## 2019-11-28 ENCOUNTER — Other Ambulatory Visit: Payer: Self-pay

## 2019-11-28 ENCOUNTER — Encounter: Payer: Self-pay | Admitting: "Endocrinology

## 2019-11-28 ENCOUNTER — Ambulatory Visit: Payer: 59 | Admitting: Nutrition

## 2019-11-28 ENCOUNTER — Ambulatory Visit (INDEPENDENT_AMBULATORY_CARE_PROVIDER_SITE_OTHER): Payer: 59 | Admitting: "Endocrinology

## 2019-11-28 VITALS — BP 135/76 | HR 71 | Ht 62.0 in | Wt 193.0 lb

## 2019-11-28 DIAGNOSIS — E782 Mixed hyperlipidemia: Secondary | ICD-10-CM | POA: Diagnosis not present

## 2019-11-28 DIAGNOSIS — I10 Essential (primary) hypertension: Secondary | ICD-10-CM | POA: Diagnosis not present

## 2019-11-28 DIAGNOSIS — E1165 Type 2 diabetes mellitus with hyperglycemia: Secondary | ICD-10-CM | POA: Diagnosis not present

## 2019-11-28 LAB — POCT GLYCOSYLATED HEMOGLOBIN (HGB A1C): Hemoglobin A1C: 7.2 % — AB (ref 4.0–5.6)

## 2019-11-28 MED ORDER — AMLODIPINE BESYLATE 10 MG PO TABS: ORAL_TABLET | ORAL | 1 refills | Status: DC

## 2019-11-28 MED FILL — AMLODIPINE BESYLATE 10 MG T: 10 | 90 days supply | Qty: 90 | Fill #0

## 2019-11-28 NOTE — Progress Notes (Signed)
11/28/2019, 12:13 PM                                             Endocrinology follow-up note    Subjective:    Patient ID: Alicia Neal, female    DOB: 19-Feb-1961.  Alicia Neal is being seen in follow-up for management of currently uncontrolled symptomatic diabetes requested by  Fayrene Helper, MD.   Past Medical History:  Diagnosis Date  . Adrenal adenoma    right side  . Allergic rhinitis   . Anxiety disorder   . Carpal tunnel syndrome of left wrist   . Chronic depression    Dr. Cheryln Manly  . GERD (gastroesophageal reflux disease)   . History of abnormal cervical Pap smear   . History of adenomatous polyp of colon   . History of hiatal hernia   . Hyperlipidemia   . Hypertension   . IBS (irritable bowel syndrome)   . OSA (obstructive sleep apnea)    per pt study yrs ago-- CPAP intolerant   . Pemphigoid, cicatricial    pruritic autoimmune blistering skin disorder  . PONV (postoperative nausea and vomiting)   . Pulmonary nodule, right pulmologist-  dr Nathaneil Canary mcquaid   x2    per CT 09-11-2015  . Trigger finger, left    ring finger  . Type 2 diabetes mellitus ([place])   . Uncontrolled type 2 diabetes mellitus with hyperglycemia ([place]) 04/17/2018  . Wears glasses    Past Surgical History:  Procedure Laterality Date  . CARDIOVASCULAR STRESS TEST  04-30-2003   Small focus of decreased perfusion on rest images in the mid-distal anterior wall (worrisome for ischemia),  normal LV function and wall motion , ef 60%  . CARPAL TUNNEL RELEASE Left 11/26/2015   Procedure: CARPAL TUNNEL RELEASE;  Surgeon: Iran Planas, MD;  Location: [facility];  Service: Orthopedics;  Laterality: Left;  . CHOLECYSTECTOMY  1989  . TRANSTHORACIC ECHOCARDIOGRAM  04-30-2003   mild hypokinetic in the inferior and posterior wall, ef 50%/  trivial MR /  mild TR  . TRIGGER FINGER RELEASE Left 11/26/2015   Procedure: RELEASE TRIGGER FINGER/A-1 PULLEY LEFT RING  FINGER;   Surgeon: Iran Planas, MD;  Location: [facility];  Service: Orthopedics;  Laterality: Left;  . TUBAL LIGATION  2000   Social History   Socioeconomic History  . Marital status: Single    Spouse name: Not on file  . Number of children: 1  . Years of education: Not on file  . Highest education level: Not on file  Occupational History  . Not on file  Tobacco Use  . Smoking status: Former Smoker    Packs/day: 1.00    Years: 40.00    Pack years: 40.00    Types: Cigarettes    Quit date: 11/07/2013    Years since quitting: 6.0  . Smokeless tobacco: Never Used  Substance and Sexual Activity  . Alcohol use: Yes    Alcohol/week: 0.0 standard drinks    Comment: occasional  . Drug use: No  . Sexual activity: Not Currently    Birth control/protection: Post-menopausal  Other Topics Concern  . Not on file  Social History Narrative  . Not on file   Social Determinants of Health   Financial Resource Strain:   . Difficulty of  Paying Living Expenses: Not on file  Food Insecurity:   . Worried About Charity fundraiser in the Last Year: Not on file  . Ran Out of Food in the Last Year: Not on file  Transportation Needs:   . Lack of Transportation (Medical): Not on file  . Lack of Transportation (Non-Medical): Not on file  Physical Activity:   . Days of Exercise per Week: Not on file  . Minutes of Exercise per Session: Not on file  Stress:   . Feeling of Stress : Not on file  Social Connections:   . Frequency of Communication with Friends and Family: Not on file  . Frequency of Social Gatherings with Friends and Family: Not on file  . Attends Religious Services: Not on file  . Active Member of Clubs or Organizations: Not on file  . Attends Archivist Meetings: Not on file  . Marital Status: Not on file   Outpatient Encounter Medications as of 11/28/2019  Medication Sig  . Accu-Chek FastClix Lancets MISC USE TO CHECK BLOOD SUGAR ONCE A DAY  . amLODipine (NORVASC) 10 MG tablet  Take one tablet by mouth every evening for blood pressure  . Cholecalciferol (VITAMIN D) 125 MCG (5000 UT) CAPS TAKE 1 CAPSULE BY MOUTH ONCE A DAY  . Cholecalciferol (VITAMIN D3 PO) Take by mouth as needed.  . citalopram (CELEXA) 20 MG tablet Take 1 tablet (20 mg total) by mouth daily.  . Dulaglutide (TRULICITY) 1.5 0000000 SOPN INJECT 1.5 MG INTO THE SKIN ONCE A WEEK.  Marland Kitchen glucose blood (ACCU-CHEK GUIDE) test strip USE TO CHECK BLOOD SUGAR TWICE DAILY  . insulin degludec (TRESIBA FLEXTOUCH) 100 UNIT/ML SOPN FlexTouch Pen Inject 0.2 mLs (20 Units total) into the skin daily.  . Insulin Pen Needle (B-D ULTRAFINE III SHORT PEN) 31G X 8 MM MISC 1 each by Does not apply route as directed.  Marland Kitchen losartan (COZAAR) 100 MG tablet Take 1 tablet (100 mg total) by mouth daily.  . metFORMIN (GLUCOPHAGE) 1000 MG tablet TAKE 1 TABLET BY MOUTH TWICE DAILY WITH MEALS  . mycophenolate (CELLCEPT) 500 MG tablet Take 500 mg by mouth daily.   . pantoprazole (PROTONIX) 40 MG tablet TAKE 1 TABLET (40 MG TOTAL) BY MOUTH DAILY.  Marland Kitchen REPATHA SURECLICK [id number] MG/ML SOAJ INJECT 140 MG INTO THE SKIN EVERY 14 (FOURTEEN) DAYS.  . [DISCONTINUED] amLODipine (NORVASC) 2.5 MG tablet Take one tablet by mouth every morning for blood pressure  . [DISCONTINUED] amLODipine (NORVASC) 5 MG tablet Take one tablet by mouth every evening for blood pressure   No facility-administered encounter medications on file as of 11/28/2019.    ALLERGIES: Allergies  Allergen Reactions  . Ezetimibe-Simvastatin Other (See Comments)    REACTION: muscle aches  . Pravachol [Pravastatin Sodium] Other (See Comments)    Joint pains and memory loss  . Simvastatin Other (See Comments)    REACTION: muscle aches  . Zetia [Ezetimibe] Other (See Comments)    Generalized joint pains    VACCINATION STATUS: Immunization History  Administered Date(s) Administered  . Influenza Split 07/19/2013, 07/22/2014  . Pneumococcal Conjugate-13 06/25/2014  . Pneumococcal  Polysaccharide-23 07/29/2019  . Tdap 06/25/2014  . Zoster Recombinat (Shingrix) 06/12/2018    Diabetes She presents for her follow-up diabetic visit. She has type 2 diabetes mellitus. Onset time: She was diagnosed at approximate age of 38 years. Her disease course has been improving (She reports A1c of 13% at the time of diagnosis, with subsequent improvement to near target  range and recent reverse to 8% in May 2019.). Pertinent negatives for hypoglycemia include no confusion, headaches, hunger, pallor or seizures. Pertinent negatives for diabetes include no chest pain, no fatigue, no polydipsia, no polyphagia and no polyuria. There are no hypoglycemic complications. Symptoms are improving. There are no diabetic complications. Risk factors for coronary artery disease include diabetes mellitus, dyslipidemia, hypertension, tobacco exposure and post-menopausal. Current diabetic treatments: She is currently on glipizide 5 mg p.o. twice daily and Janumet 50/1000 mg p.o. twice daily. Her weight is fluctuating minimally. She is following a generally unhealthy diet. When asked about meal planning, she reported none. She has not had a previous visit with a dietitian. She participates in exercise intermittently. Her home blood glucose trend is decreasing steadily. Her breakfast blood glucose range is generally 140-180 mg/dl. Her bedtime blood glucose range is generally 140-180 mg/dl. Her overall blood glucose range is 140-180 mg/dl. An ACE inhibitor/angiotensin II receptor blocker is being taken. Eye exam is current.  Hyperlipidemia This is a chronic problem. The current episode started more than 1 year ago. The problem is uncontrolled. Recent lipid tests were reviewed and are high. Exacerbating diseases include diabetes. Pertinent negatives include no chest pain, myalgias or shortness of breath. Treatments tried: Patient is not tolerating statins including Livalo. Risk factors for coronary artery disease include  dyslipidemia, diabetes mellitus, hypertension and post-menopausal.  Hypertension This is a chronic problem. The current episode started more than 1 year ago. The problem is uncontrolled. Pertinent negatives include no chest pain, headaches, palpitations or shortness of breath. Risk factors for coronary artery disease include dyslipidemia, diabetes mellitus and smoking/tobacco exposure. Past treatments include angiotensin blockers.   Review of systems:  Constitutional: + steady weight, no fatigue, no subjective hyperthermia, no subjective hypothermia Eyes: no blurry vision, no xerophthalmia ENT: no sore throat, no nodules palpated in throat, no dysphagia/odynophagia, no hoarseness Cardiovascular: no Chest Pain, no Shortness of Breath, no palpitations, no leg swelling Respiratory: no cough, no SOB Gastrointestinal: no Nausea/Vomiting/Diarhhea Musculoskeletal: no muscle/joint aches Skin: no rashes Neurological: no tremors, no numbness, no tingling, no dizziness Psychiatric: no depression, no anxiety   Objective:    BP 135/76   Pulse 71   Ht 5\' 2"  (1.575 m)   Wt 193 lb (87.5 kg)   BMI 35.30 kg/m   Wt Readings from Last 3 Encounters:  11/28/19 193 lb (87.5 kg)  11/13/19 192 lb (87.1 kg)  09/16/19 191 lb (86.6 kg)     Physical Exam- Limited  Constitutional:  Body mass index is 35.3 kg/m. , not in acute distress, normal state of mind Eyes:  EOMI, no exophthalmos Neck: Supple Respiratory: Adequate breathing efforts Musculoskeletal: no gross deformities, strength intact in all four extremities, no gross restriction of joint movements Skin:  no rashes, no hyperemia Neurological: no tremor with outstretched hands.  CMP     Component Value Date/Time   NA 137 08/01/2019 1000   K 4.3 08/01/2019 1000   CL 101 08/01/2019 1000   CO2 27 08/01/2019 1000   GLUCOSE 141 (H) 08/01/2019 1000   BUN 14 08/01/2019 1000   CREATININE 0.60 08/01/2019 1000   CREATININE 0.84 11/18/2016 0844    CALCIUM 8.9 08/01/2019 1000   PROT 7.2 08/01/2019 1000   ALBUMIN 4.3 08/01/2019 1000   AST 39 08/01/2019 1000   ALT 54 (H) 08/01/2019 1000   ALKPHOS 53 08/01/2019 1000   BILITOT 0.6 08/01/2019 1000   GFRNONAA >60 08/01/2019 1000   GFRNONAA 77 11/18/2016 0844  GFRAA >60 08/01/2019 1000   GFRAA 89 11/18/2016 0844     Diabetic Labs (most recent): Lab Results  Component Value Date   HGBA1C 7.2 (A) 11/28/2019   HGBA1C 8.0 (H) 08/01/2019   HGBA1C 7.4 (H) 04/04/2019     Lipid Panel ( most recent) Lipid Panel     Component Value Date/Time   CHOL 182 08/01/2019 1000   TRIG 123 08/01/2019 1000   HDL 55 08/01/2019 1000   CHOLHDL 3.3 08/01/2019 1000   VLDL 25 08/01/2019 1000   LDLCALC 102 (H) 08/01/2019 1000      Lab Results  Component Value Date   TSH 1.483 08/01/2019   TSH 1.601 06/08/2018   TSH 1.724 06/08/2017   TSH 1.620 06/08/2015   TSH 1.739 06/23/2014   TSH 1.659 04/15/2013   TSH 1.634 01/11/2012   TSH 1.344 11/04/2010   TSH 1.349 10/16/2009   TSH 2.028 01/25/2008   FREET4 0.92 06/08/2018      Assessment & Plan:   1. Uncontrolled type 2 diabetes mellitus with hyperglycemia ([place])  - Alicia Neal has currently uncontrolled symptomatic type 2 DM since 59 years of age. -She presents with improved glycemic profile, no hypoglycemia. Her POC a1c is 7.2% improved from  8%   -She does not report any gross complications from her diabetes, howeverMonique H Neal remains at a high risk for acute and chronic complications of type 2 diabetes which include CAD, CVA, CKD, retinopathy, and neuropathy. These are all discussed in l with the patient.  - I have counseled her on diet management and weight loss, by adopting a carbohydrate restricted/protein rich diet.  - she  admits there is a room for improvement in her diet and drink choices. -  Suggestion is made for her to avoid simple carbohydrates  from her diet including Cakes, Sweet Desserts / Pastries, Ice Cream,  Soda (diet and regular), Sweet Tea, Candies, Chips, Cookies, Sweet Pastries,  Store Bought Juices, Alcohol in Excess of  1-2 drinks a day, Artificial Sweeteners, Coffee Creamer, and "Sugar-free" Products. This will help patient to have stable blood glucose profile and potentially avoid unintended weight gain.   - I encouraged her to switch to  unprocessed or minimally processed complex starch and increased protein intake (animal or plant source), fruits, and vegetables.  - she is advised to stick to a routine mealtimes to eat 3 meals  a day and avoid unnecessary snacks ( to snack only to correct hypoglycemia).   - she is scheduled  with Jearld Fenton, RDN, CDE for individualized diabetes education.  - I have approached her with the following individualized plan to manage diabetes and patient agrees:   -She will continue to need at least basal insulin inorder for her to maintain control of glycemia to target.  She is  advised to continue Tresiba 20 units nightly,   associated with monitoring of blood glucose 2 times a day-daily before breakfast and at bedtime.   She is advised to continue metformin 1000 mg p.o. twice daily, and Trulicity 1.5 mg subcutaneously weekly.  - Patient specific target  A1c;  LDL, HDL, Triglycerides, and  Waist Circumference were discussed in detail.  2) BP/HTN: Her blood pressure is not  controlled to target.  She has mild microalbuminuria.  She is advised to increase Amlodipine to 10mg  po qday, continue  losartan  100 mg p.o. daily.  3) Lipids/HPL:   Her recent lipid panel showed significantly improved LDL at 102 from 162.  She  has benefited from [place], advised to continue Repatha 140 mg subcutaneously every other week.   she is advised to continue the Livalo 2 mg p.o. nightly.  4)  Weight/Diet: She reports some weight gain, CDE Consult has been  initiated , exercise, and detailed carbohydrates information provided.  5) Chronic Care/Health Maintenance:  -she   is on ACEI/ARB and Statin medications and  is encouraged to continue to follow up with Ophthalmology, Dentist,  Podiatrist at least yearly or according to recommendations, and advised to  stay away from smoking. I have recommended yearly flu vaccine and pneumonia vaccination at least every 5 years; moderate intensity exercise for up to 150 minutes weekly; and  sleep for at least 7 hours a day.  - I advised patient to maintain close follow up with Fayrene Helper, MD for primary care needs.   - Time spent on this patient care encounter:  35 min, of which > 50% was spent in  counseling and the rest reviewing her blood glucose logs , discussing her hypoglycemia and hyperglycemia episodes, reviewing her current and  previous labs / studies  ( including abstraction from other facilities) and medications  doses and developing a  long term treatment plan and documenting her care.   Please refer to Patient Instructions for Blood Glucose Monitoring and Insulin/Medications Dosing Guide"  in media tab for additional information. Please  also refer to " Patient Self Inventory" in the Media  tab for reviewed elements of pertinent patient history.  Alicia Neal participated in the discussions, expressed understanding, and voiced agreement with the above plans.  All questions were answered to her satisfaction. she is encouraged to contact clinic should she have any questions or concerns prior to her return visit.    Follow up plan: - Return in about 4 months (around 03/27/2020) for Follow up with Pre-visit Labs, Bring Meter and Logs- A1c in Office.  Glade Lloyd, MD [facility] [address] [place], [place] [address] Phone: [phone]  Fax: [phone]    11/28/2019, 12:13 PM  This note was partially dictated with voice recognition software. Similar sounding words can be transcribed inadequately or may not  be corrected upon review.

## 2019-12-01 MED FILL — TRESIBA FLEXTOUCH 100 UNITS: 100 | 75 days supply | Qty: 15 | Fill #1

## 2019-12-02 MED FILL — TRESIBA FLEXTOUCH 100 UNITS: 100 | 75 days supply | Qty: 15 | Fill #1

## 2019-12-04 MED FILL — LOSARTAN POTASSIUM 100 MG T: 100 | 90 days supply | Qty: 90 | Fill #3

## 2019-12-05 MED FILL — TRULICITY 1.5 MG/0.5 ML PEN: 1.5 | 28 days supply | Qty: 2 | Fill #3

## 2019-12-06 DIAGNOSIS — L121 Cicatricial pemphigoid: Secondary | ICD-10-CM | POA: Diagnosis not present

## 2019-12-06 DIAGNOSIS — Z5181 Encounter for therapeutic drug level monitoring: Secondary | ICD-10-CM | POA: Diagnosis not present

## 2019-12-06 DIAGNOSIS — Z79899 Other long term (current) drug therapy: Secondary | ICD-10-CM | POA: Diagnosis not present

## 2019-12-09 ENCOUNTER — Ambulatory Visit (AMBULATORY_SURGERY_CENTER): Payer: Self-pay

## 2019-12-09 ENCOUNTER — Other Ambulatory Visit: Payer: Self-pay

## 2019-12-09 VITALS — Temp 96.9°F | Ht 62.0 in | Wt 194.4 lb

## 2019-12-09 DIAGNOSIS — Z86010 Personal history of colonic polyps: Secondary | ICD-10-CM

## 2019-12-09 DIAGNOSIS — Z01818 Encounter for other preprocedural examination: Secondary | ICD-10-CM

## 2019-12-09 MED ORDER — NA SULFATE-K SULFATE-MG SULF 17.5-3.13-1.6 GM/177ML PO SOLN: 1.0000 | Freq: Once | ORAL | 0 refills | Status: AC

## 2019-12-09 MED FILL — SUPREP BOWEL PREP KIT: 17.5-3.13-1 | 2 days supply | Qty: 354 | Fill #0

## 2019-12-09 NOTE — Progress Notes (Signed)

## 2019-12-18 ENCOUNTER — Other Ambulatory Visit: Payer: Self-pay

## 2019-12-18 ENCOUNTER — Other Ambulatory Visit (HOSPITAL_COMMUNITY)
Admission: RE | Admit: 2019-12-18 | Discharge: 2019-12-18 | Disposition: A | Discharge status: Home / Self Care | Payer: 59 | Source: Ambulatory Visit | Attending: Gastroenterology | Admitting: Gastroenterology

## 2019-12-18 ENCOUNTER — Encounter: Payer: Self-pay | Admitting: Gastroenterology

## 2019-12-18 DIAGNOSIS — Z01812 Encounter for preprocedural laboratory examination: Secondary | ICD-10-CM | POA: Insufficient documentation

## 2019-12-18 DIAGNOSIS — Z20822 Contact with and (suspected) exposure to covid-19: Secondary | ICD-10-CM | POA: Insufficient documentation

## 2019-12-18 LAB — SARS CORONAVIRUS 2 (TAT 6-24 HRS): SARS Coronavirus 2: NEGATIVE

## 2019-12-20 MED FILL — REPATHA SURECLICK 140 MG/ML: 140 | 28 days supply | Qty: 2 | Fill #2

## 2019-12-23 ENCOUNTER — Other Ambulatory Visit: Payer: Self-pay

## 2019-12-23 ENCOUNTER — Ambulatory Visit (AMBULATORY_SURGERY_CENTER): Payer: 59 | Admitting: Gastroenterology

## 2019-12-23 ENCOUNTER — Encounter: Payer: Self-pay | Admitting: Gastroenterology

## 2019-12-23 VITALS — BP 101/78 | HR 61 | Temp 97.5°F | Resp 17 | Ht 62.0 in | Wt 194.4 lb

## 2019-12-23 DIAGNOSIS — D122 Benign neoplasm of ascending colon: Secondary | ICD-10-CM

## 2019-12-23 DIAGNOSIS — D124 Benign neoplasm of descending colon: Secondary | ICD-10-CM

## 2019-12-23 DIAGNOSIS — Z1211 Encounter for screening for malignant neoplasm of colon: Secondary | ICD-10-CM | POA: Diagnosis not present

## 2019-12-23 DIAGNOSIS — D120 Benign neoplasm of cecum: Secondary | ICD-10-CM

## 2019-12-23 DIAGNOSIS — Z860100 Personal history of colon polyps, unspecified: Secondary | ICD-10-CM

## 2019-12-23 MED ORDER — SODIUM CHLORIDE 0.9 % IV SOLN: 500.0000 mL | Freq: Once | INTRAVENOUS | Status: DC

## 2019-12-23 NOTE — Progress Notes (Signed)
Pt's states no medical or surgical changes since previsit or office visit. 

## 2019-12-23 NOTE — Progress Notes (Signed)
Called to room to assist during endoscopic procedure.  Patient ID and intended procedure confirmed with present staff. Received instructions for my participation in the procedure from the performing physician.  

## 2019-12-23 NOTE — Progress Notes (Signed)
To PACU, VSS. Report to [email or web address] 

## 2019-12-23 NOTE — Patient Instructions (Signed)
Handouts provided on polyps, diverticulosis, hemorrhoids, and high-fiber diet.   YOU HAD AN ENDOSCOPIC PROCEDURE TODAY AT [facility]:   Refer to the procedure report that was given to you for any specific questions about what was found during the examination.  If the procedure report does not answer your questions, please call your gastroenterologist to clarify.  If you requested that your care partner not be given the details of your procedure findings, then the procedure report has been included in a sealed envelope for you to review at your convenience later.  YOU SHOULD EXPECT: Some feelings of bloating in the abdomen. Passage of more gas than usual.  Walking can help get rid of the air that was put into your GI tract during the procedure and reduce the bloating. If you had a lower endoscopy (such as a colonoscopy or flexible sigmoidoscopy) you may notice spotting of blood in your stool or on the toilet paper. If you underwent a bowel prep for your procedure, you may not have a normal bowel movement for a few days.  Please Note:  You might notice some irritation and congestion in your nose or some drainage.  This is from the oxygen used during your procedure.  There is no need for concern and it should clear up in a day or so.  SYMPTOMS TO REPORT IMMEDIATELY:   Following lower endoscopy (colonoscopy or flexible sigmoidoscopy):  Excessive amounts of blood in the stool  Significant tenderness or worsening of abdominal pains  Swelling of the abdomen that is new, acute  Fever of 100F or higher   For urgent or emergent issues, a gastroenterologist can be reached at any hour by calling [phone].   DIET:  We do recommend a small meal at first, but then you may proceed to your regular diet.  Drink plenty of fluids but you should avoid alcoholic beverages for 24 hours.  ACTIVITY:  You should plan to take it easy for the rest of today and you should NOT DRIVE or use heavy  machinery until tomorrow (because of the sedation medicines used during the test).    FOLLOW UP: Our staff will call the number listed on your records 48-72 hours following your procedure to check on you and address any questions or concerns that you may have regarding the information given to you following your procedure. If we do not reach you, we will leave a message.  We will attempt to reach you two times.  During this call, we will ask if you have developed any symptoms of COVID 19. If you develop any symptoms (ie: fever, flu-like symptoms, shortness of breath, cough etc.) before then, please call [phone].  If you test positive for Covid 19 in the 2 weeks post procedure, please call and report this information to [place].    If any biopsies were taken you will be contacted by phone or by letter within the next 1-3 weeks.  Please call us at [phone] if you have not heard about the biopsies in 3 weeks.    SIGNATURES/CONFIDENTIALITY: You and/or your care partner have signed paperwork which will be entered into your electronic medical record.  These signatures attest to the fact that that the information above on your After Visit Summary has been reviewed and is understood.  Full responsibility of the confidentiality of this discharge information lies with you and/or your care-partner.

## 2019-12-23 NOTE — Op Note (Addendum)
[facility] Patient Name: Alicia Neal Procedure Date: 12/23/2019 9:36 AM MRN: [id number] Endoscopist: Ladene Artist , MD Age: 59 Referring MD:  Date of Birth: 1961/03/11 Gender: Female Account #: [id number] Procedure:                Colonoscopy Indications:              High risk colon cancer surveillance: Personal                            history of traditional serrated adenoma of the colon Medicines:                Monitored Anesthesia Care Procedure:                Pre-Anesthesia Assessment:                           - Prior to the procedure, a History and Physical                            was performed, and patient medications and                            allergies were reviewed. The patient's tolerance of                            previous anesthesia was also reviewed. The risks                            and benefits of the procedure and the sedation                            options and risks were discussed with the patient.                            All questions were answered, and informed consent                            was obtained. Prior Anticoagulants: The patient has                            taken no previous anticoagulant or antiplatelet                            agents. ASA Grade Assessment: II - A patient with                            mild systemic disease. After reviewing the risks                            and benefits, the patient was deemed in                            satisfactory condition to undergo the procedure.  After obtaining informed consent, the colonoscope                            was passed under direct vision. Throughout the                            procedure, the patient's blood pressure, pulse, and                            oxygen saturations were monitored continuously. The                            Colonoscope was introduced through the anus and                            advanced to  the the cecum, identified by                            appendiceal orifice and ileocecal valve. The                            ileocecal valve, appendiceal orifice, and rectum                            were photographed. The quality of the bowel                            preparation was excellent. The colonoscopy was                            performed without difficulty. The patient tolerated                            the procedure well. Scope In: 9:44:46 AM Scope Out: 10:03:32 AM Scope Withdrawal Time: 0 hours 16 minutes 22 seconds  Total Procedure Duration: 0 hours 18 minutes 46 seconds  Findings:                 The perianal and digital rectal examinations were                            normal.                           Three sessile polyps were found in the descending                            colon, ascending colon and cecum. The polyps were 6                            to 7 mm in size. These polyps were removed with a                            cold snare. Resection and retrieval were complete.  A few small-mouthed diverticula were found in the                            sigmoid colon.                           Internal hemorrhoids were found during                            retroflexion. The hemorrhoids were small and Grade                            I (internal hemorrhoids that do not prolapse).                           The exam was otherwise without abnormality on                            direct and retroflexion views. Complications:            No immediate complications. Estimated blood loss:                            None. Estimated Blood Loss:     Estimated blood loss: none. Impression:               - Three 6 to 7 mm polyps in the descending colon,                            in the ascending colon and in the cecum, removed                            with a cold snare. Resected and retrieved.                           - Mild  diverticulosis in the sigmoid colon.                           - Internal hemorrhoids.                           - The examination was otherwise normal on direct                            and retroflexion views. Recommendation:           - Repeat colonoscopy after studies are complete for                            surveillance based on pathology results.                           - Patient has a contact number available for                            emergencies. The signs and symptoms of potential  delayed complications were discussed with the                            patient. Return to normal activities tomorrow.                            Written discharge instructions were provided to the                            patient.                           - High fiber diet.                           - Continue present medications.                           - Await pathology results.                           - Schedule office appt for GERD with increasing                            night time symptoms. Ladene Artist, MD 12/23/2019 10:08:04 AM This report has been signed electronically.

## 2019-12-23 NOTE — Addendum Note (Signed)
Addended by: Jess Barters L on: 12/23/2019 11:14 AM   Modules accepted: Orders

## 2019-12-24 MED FILL — UNIFINE PENTIPS 8MM 31G: 31G X 8 MM | 90 days supply | Qty: 100 | Fill #1

## 2019-12-25 ENCOUNTER — Telehealth: Payer: Self-pay | Admitting: *Deleted

## 2019-12-25 ENCOUNTER — Encounter: Payer: Self-pay | Admitting: Gastroenterology

## 2019-12-25 NOTE — Telephone Encounter (Signed)
  Follow up Call-  Call back number 12/23/2019  Post procedure Call Back phone  # [phone]  Permission to leave phone message Yes  Some recent data might be hidden     Patient questions:  Do you have a fever, pain , or abdominal swelling? No. Pain Score  0 *  Have you tolerated food without any problems? Yes.    Have you been able to return to your normal activities? Yes.    Do you have any questions about your discharge instructions: Diet   No. Medications  No. Follow up visit  No.  Do you have questions or concerns about your Care? No.  Actions: * If pain score is 4 or above: No action needed, pain <4.   1. Have you developed a fever since your procedure? no  2.   Have you had an respiratory symptoms (SOB or cough) since your procedure? no  3.   Have you tested positive for COVID 19 since your procedure no  4.   Have you had any family members/close contacts diagnosed with the COVID 19 since your procedure?  no   If yes to any of these questions please route to Joylene John, RN and Alphonsa Gin, Therapist, sports.

## 2019-12-26 ENCOUNTER — Encounter: Payer: Self-pay | Admitting: Family Medicine

## 2019-12-31 ENCOUNTER — Ambulatory Visit: Payer: 59 | Admitting: Family Medicine

## 2019-12-31 ENCOUNTER — Other Ambulatory Visit: Payer: Self-pay

## 2019-12-31 ENCOUNTER — Other Ambulatory Visit (HOSPITAL_COMMUNITY)
Admission: RE | Admit: 2019-12-31 | Discharge: 2019-12-31 | Disposition: A | Discharge status: Home / Self Care | Payer: 59 | Source: Ambulatory Visit | Attending: Family Medicine | Admitting: Family Medicine

## 2019-12-31 ENCOUNTER — Encounter: Payer: Self-pay | Admitting: Family Medicine

## 2019-12-31 VITALS — BP 126/82 | HR 72 | Temp 98.5°F | Resp 15 | Ht 62.0 in | Wt 193.0 lb

## 2019-12-31 DIAGNOSIS — E1159 Type 2 diabetes mellitus with other circulatory complications: Secondary | ICD-10-CM | POA: Diagnosis not present

## 2019-12-31 DIAGNOSIS — I10 Essential (primary) hypertension: Secondary | ICD-10-CM

## 2019-12-31 DIAGNOSIS — E785 Hyperlipidemia, unspecified: Secondary | ICD-10-CM | POA: Diagnosis not present

## 2019-12-31 DIAGNOSIS — F419 Anxiety disorder, unspecified: Secondary | ICD-10-CM

## 2019-12-31 DIAGNOSIS — E1165 Type 2 diabetes mellitus with hyperglycemia: Secondary | ICD-10-CM

## 2019-12-31 DIAGNOSIS — E782 Mixed hyperlipidemia: Secondary | ICD-10-CM | POA: Diagnosis not present

## 2019-12-31 NOTE — Patient Instructions (Signed)
F/U in 4 months, call if you need me before  \ Please work on reducing sale , carbs , sweets , anmd continue exercise commitment  Please reconsider psychological support for stress management to help in the journey  Weight loss as vegetables increase will help with bP and blood sugar control   No med change  Fasting lab today please  It is important that you exercise regularly at least 30 minutes 5 times a week. If you develop chest pain, have severe difficulty breathing, or feel very tired, stop exercising immediately and seek medical attention    Think about what you will eat, plan ahead. Choose " clean, green, fresh or frozen" over canned, processed or packaged foods which are more sugary, salty and fatty. 70 to 75% of food eaten should be vegetables and fruit. Three meals at set times with snacks allowed between meals, but they must be fruit or vegetables. Aim to eat over a 12 hour period , example 7 am to 7 pm, and STOP after  your last meal of the day. Drink water,generally about 64 ounces per day, no other drink is as healthy. Fruit juice is best enjoyed in a healthy way, by EATING the fruit.   Thanks for choosing [facility], we consider it a privelige to serve you.

## 2020-01-01 ENCOUNTER — Encounter: Payer: Self-pay | Admitting: Family Medicine

## 2020-01-01 LAB — CBC
HCT: 41.4 % (ref 36.0–46.0)
Hemoglobin: 13.4 g/dL (ref 12.0–15.0)
MCH: 30.2 pg (ref 26.0–34.0)
MCHC: 32.4 g/dL (ref 30.0–36.0)
MCV: 93.2 fL (ref 80.0–100.0)
Platelets: 283 10*3/uL (ref 150–400)
RBC: 4.44 MIL/uL (ref 3.87–5.11)
RDW: 12.6 % (ref 11.5–15.5)
WBC: 8 10*3/uL (ref 4.0–10.5)
nRBC: 0 % (ref 0.0–0.2)

## 2020-01-01 LAB — COMPREHENSIVE METABOLIC PANEL
ALT: 40 U/L (ref 0–44)
AST: 31 U/L (ref 15–41)
Albumin: 4.2 g/dL (ref 3.5–5.0)
Alkaline Phosphatase: 59 U/L (ref 38–126)
Anion gap: 11 (ref 5–15)
BUN: 13 mg/dL (ref 6–20)
CO2: 26 mmol/L (ref 22–32)
Calcium: 9 mg/dL (ref 8.9–10.3)
Chloride: 98 mmol/L (ref 98–111)
Creatinine, Ser: 0.61 mg/dL (ref 0.44–1.00)
GFR calc Af Amer: >60 mL/min (ref >60)
GFR calc non Af Amer: >60 mL/min (ref >60)
Glucose, Bld: 120 mg/dL — ABNORMAL HIGH (ref 70–99)
Potassium: 4 mmol/L (ref 3.5–5.1)
Sodium: 135 mmol/L (ref 135–145)
Total Bilirubin: 0.7 mg/dL (ref 0.3–1.2)
Total Protein: 7 g/dL (ref 6.5–8.1)

## 2020-01-01 LAB — LIPID PANEL
Cholesterol: 167 mg/dL (ref 0–200)
HDL: 66 mg/dL (ref >40)
LDL Cholesterol: 72 mg/dL (ref 0–99)
Total CHOL/HDL Ratio: 2.5 RATIO
Triglycerides: 145 mg/dL (ref <150)
VLDL: 29 mg/dL (ref 0–40)

## 2020-01-03 MED FILL — metFORMIN HCL 1000 MG TABS: 1000 | 90 days supply | Qty: 180 | Fill #1

## 2020-01-04 ENCOUNTER — Encounter: Payer: Self-pay | Admitting: Family Medicine

## 2020-01-04 NOTE — Assessment & Plan Note (Signed)
Alicia Neal is reminded of the importance of commitment to daily physical activity for 30 minutes or more, as able and the need to limit carbohydrate intake to 30 to 60 grams per meal to help with blood sugar control.   The need to take medication as prescribed, test blood sugar as directed, and to call between visits if there is a concern that blood sugar is uncontrolled is also discussed.   Alicia Neal is reminded of the importance of daily foot exam, annual eye examination, and good blood sugar, blood pressure and cholesterol control.  Diabetic Labs Latest Ref Rng & Units 01/01/2020 11/28/2019 08/01/2019 04/04/2019 11/29/2018  HbA1c 4.0 - 5.6 % - 7.2(A) 8.0(H) 7.4(H) 6.7(H)  Microalbumin Not Estab. ug/mL - - 26.1(H) - -  Micro/Creat Ratio 0 - 29 mg/g creat - - 22 - -  Chol 0 - 200 mg/dL 167 - 182 - 258(H)  HDL >40 mg/dL 66 - 55 - 56  Calc LDL 0 - 99 mg/dL 72 - 102(H) - 162(H)  Triglycerides <150 mg/dL 145 - 123 - 202(H)  Creatinine 0.44 - 1.00 mg/dL 0.61 - 0.60 0.61 0.68   BP/Weight 12/31/2019 12/23/2019 12/09/2019 11/28/2019 11/13/2019 09/16/2019 [id number]  Systolic BP 123XX123 99991111 - [id number] 123456 123456 123456  Diastolic BP 82 78 - 76 94 94 86  Wt. (Lbs) 193 194.4 194.4 193 192 191 189  BMI 35.3 35.56 35.56 35.3 35.12 34.93 34.57   Foot/eye exam completion dates Latest Ref Rng & Units 07/29/2019 11/02/2018  Eye Exam No Retinopathy - No Retinopathy  Foot Form Completion - Done -    Managed  By Endo and controlled

## 2020-01-04 NOTE — Assessment & Plan Note (Signed)
  Patient re-educated about  the importance of commitment to a  minimum of 150 minutes of exercise per week as able.  The importance of healthy food choices with portion control discussed, as well as eating regularly and within a 12 hour window most days. The need to choose "clean , green" food 50 to 75% of the time is discussed, as well as to make water the primary drink and set a goal of 64 ounces water daily.    Weight /BMI 12/31/2019 12/23/2019 12/09/2019  WEIGHT 193 lb 194 lb 6.4 oz 194 lb 6.4 oz  HEIGHT 5\' 2"  5\' 2"  5\' 2"   BMI 35.3 kg/m2 35.56 kg/m2 35.56 kg/m2

## 2020-01-04 NOTE — Assessment & Plan Note (Signed)
Increased due to life and work stress, again therapy is offered

## 2020-01-04 NOTE — Progress Notes (Signed)
Alicia Neal     MRN: [id number]      DOB: [birth year]   HPI Alicia Neal is here for follow up and re-evaluation of chronic medical conditions, medication management and review of any available recent lab and radiology data.  Preventive health is updated, specifically  Cancer screening and Immunization.   Questions or concerns regarding consultations or procedures which the PT has had in the interim are  addressed. The PT denies any adverse reactions to current medications since the last visit.  C/o uncontrolled blood sugar and increased Carb intake, also being unable to lose weight  ROS Denies recent fever or chills. Denies sinus pressure, nasal congestion, ear pain or sore throat. Denies chest congestion, productive cough or wheezing. Denies chest pains, palpitations and leg swelling Denies abdominal pain, nausea, vomiting,diarrhea or constipation.   Denies dysuria, frequency, hesitancy or incontinence. Denies uncontrolled  joint pain, swelling and limitation in mobility. Denies headaches, seizures, numbness, or tingling. Denies depression,c/o increased and uncontrolled  anxiety denies  insomnia. Denies skin break down or rash.   PE  BP 126/82   Pulse 72   Temp 98.5 F (36.9 C) (Temporal)   Resp 15   Ht 5\' 2"  (1.575 m)   Wt 193 lb (87.5 kg)   SpO2 97%   BMI 35.30 kg/m   Patient alert and oriented and in no cardiopulmonary distress.  HEENT: No facial asymmetry, EOMI,     Neck supple .  Chest: Clear to auscultation bilaterally.  CVS: S1, S2 no murmurs, no S3.Regular rate.  ABD: Soft non tender.   Ext: No edema  MS: Adequate ROM spine, shoulders, hips and knees.  Skin: Intact, no ulcerations or rash noted.  Psych: Good eye contact, normal affect. Memory intact not anxious or depressed appearing.  CNS: CN 2-12 intact, power,  normal throughout.no focal deficits noted.   Assessment & Plan  HTN, goal below 130/80 Controlled, no change in medication DASH  diet and commitment to daily physical activity for a minimum of 30 minutes discussed and encouraged, as a part of hypertension management. The importance of attaining a healthy weight is also discussed.  BP/Weight 12/31/2019 12/23/2019 12/09/2019 11/28/2019 11/13/2019 09/16/2019 [id number]  Systolic BP 123XX123 99991111 - A999333 123456 123456 123456  Diastolic BP 82 78 - 76 94 94 86  Wt. (Lbs) 193 194.4 194.4 193 192 191 189  BMI 35.3 35.56 35.56 35.3 35.12 34.93 34.57       Anxiety Increased due to life and work stress, again therapy is offered  Hyperlipidemia LDL goal <100 Hyperlipidemia:Low fat diet discussed and encouraged.   Lipid Panel  Lab Results  Component Value Date   CHOL 167 01/01/2020   HDL 66 01/01/2020   LDLCALC 72 01/01/2020   TRIG 145 01/01/2020   CHOLHDL 2.5 01/01/2020   Controlled, no change in medication     Morbid obesity ([place])  Patient re-educated about  the importance of commitment to a  minimum of 150 minutes of exercise per week as able.  The importance of healthy food choices with portion control discussed, as well as eating regularly and within a 12 hour window most days. The need to choose "clean , green" food 50 to 75% of the time is discussed, as well as to make water the primary drink and set a goal of 64 ounces water daily.    Weight /BMI 12/31/2019 12/23/2019 12/09/2019  WEIGHT 193 lb 194 lb 6.4 oz 194 lb 6.4 oz  HEIGHT 5\' 2"  5'  2" 5\' 2"   BMI 35.3 kg/m2 35.56 kg/m2 35.56 kg/m2      Uncontrolled type 2 diabetes mellitus with hyperglycemia ([place]) Alicia Neal is reminded of the importance of commitment to daily physical activity for 30 minutes or more, as able and the need to limit carbohydrate intake to 30 to 60 grams per meal to help with blood sugar control.   The need to take medication as prescribed, test blood sugar as directed, and to call between visits if there is a concern that blood sugar is uncontrolled is also discussed.   Alicia Neal is reminded of the  importance of daily foot exam, annual eye examination, and good blood sugar, blood pressure and cholesterol control.  Diabetic Labs Latest Ref Rng & Units 01/01/2020 11/28/2019 08/01/2019 04/04/2019 11/29/2018  HbA1c 4.0 - 5.6 % - 7.2(A) 8.0(H) 7.4(H) 6.7(H)  Microalbumin Not Estab. ug/mL - - 26.1(H) - -  Micro/Creat Ratio 0 - 29 mg/g creat - - 22 - -  Chol 0 - 200 mg/dL 167 - 182 - 258(H)  HDL >40 mg/dL 66 - 55 - 56  Calc LDL 0 - 99 mg/dL 72 - 102(H) - 162(H)  Triglycerides <150 mg/dL 145 - 123 - 202(H)  Creatinine 0.44 - 1.00 mg/dL 0.61 - 0.60 0.61 0.68   BP/Weight 12/31/2019 12/23/2019 12/09/2019 11/28/2019 11/13/2019 09/16/2019 [id number]  Systolic BP 123XX123 99991111 - A999333 123456 123456 123456  Diastolic BP 82 78 - 76 94 94 86  Wt. (Lbs) 193 194.4 194.4 193 192 191 189  BMI 35.3 35.56 35.56 35.3 35.12 34.93 34.57   Foot/eye exam completion dates Latest Ref Rng & Units 07/29/2019 11/02/2018  Eye Exam No Retinopathy - No Retinopathy  Foot Form Completion - Done -    Managed  By Endo and controlled

## 2020-01-04 NOTE — Assessment & Plan Note (Signed)
Controlled, no change in medication DASH diet and commitment to daily physical activity for a minimum of 30 minutes discussed and encouraged, as a part of hypertension management. The importance of attaining a healthy weight is also discussed.  BP/Weight 12/31/2019 12/23/2019 12/09/2019 11/28/2019 11/13/2019 09/16/2019 [id number]  Systolic BP 123XX123 99991111 - A999333 123456 123456 123456  Diastolic BP 82 78 - 76 94 94 86  Wt. (Lbs) 193 194.4 194.4 193 192 191 189  BMI 35.3 35.56 35.56 35.3 35.12 34.93 34.57

## 2020-01-04 NOTE — Assessment & Plan Note (Signed)
Hyperlipidemia:Low fat diet discussed and encouraged.   Lipid Panel  Lab Results  Component Value Date   CHOL 167 01/01/2020   HDL 66 01/01/2020   LDLCALC 72 01/01/2020   TRIG 145 01/01/2020   CHOLHDL 2.5 01/01/2020   Controlled, no change in medication

## 2020-01-11 MED FILL — PANTOPRAZOLE SOD DR 40 MG T: 40 | 90 days supply | Qty: 90 | Fill #1

## 2020-01-11 MED FILL — TRULICITY 1.5 MG/0.5 ML PEN: 1.5 | 28 days supply | Qty: 2 | Fill #4

## 2020-01-13 MED FILL — MYCOPHENOLATE 500 MG TABLET: 500 | 90 days supply | Qty: 90 | Fill #0

## 2020-01-27 MED FILL — REPATHA SURECLICK 140 MG/ML: 140 | 28 days supply | Qty: 2 | Fill #3

## 2020-02-05 ENCOUNTER — Other Ambulatory Visit: Payer: Self-pay | Admitting: "Endocrinology

## 2020-02-05 MED FILL — TRULICITY 1.5 MG/0.5 ML PEN: 1.5 | 28 days supply | Qty: 2 | Fill #5

## 2020-02-11 ENCOUNTER — Ambulatory Visit: Payer: 59 | Admitting: Family Medicine

## 2020-02-14 MED FILL — CITALOPRAM HBR 20 MG TABLET: 20 | 90 days supply | Qty: 90 | Fill #1

## 2020-02-26 ENCOUNTER — Other Ambulatory Visit: Payer: Self-pay | Admitting: "Endocrinology

## 2020-02-26 MED FILL — LOSARTAN POTASSIUM 100 MG T: 100 | 90 days supply | Qty: 90 | Fill #4

## 2020-02-28 MED FILL — REPATHA SURECLICK 140 MG/ML: 140 | 28 days supply | Qty: 2 | Fill #0

## 2020-03-02 MED FILL — TRESIBA FLEXTOUCH 100 UNITS: 100 | 75 days supply | Qty: 15 | Fill #2

## 2020-03-02 MED FILL — TRULICITY 1.5 MG/0.5 ML PEN: 1.5 | 28 days supply | Qty: 2 | Fill #0

## 2020-03-12 ENCOUNTER — Encounter: Payer: Self-pay | Admitting: Family Medicine

## 2020-03-12 DIAGNOSIS — E119 Type 2 diabetes mellitus without complications: Secondary | ICD-10-CM | POA: Diagnosis not present

## 2020-03-12 LAB — HM DIABETES EYE EXAM

## 2020-03-13 MED FILL — RESTASIS 0.05% EYE EMULSION: 0.05 | 90 days supply | Qty: 180 | Fill #0

## 2020-03-16 ENCOUNTER — Encounter: Payer: Self-pay | Admitting: *Deleted

## 2020-03-23 MED FILL — AMLODIPINE BESYLATE 10 MG T: 10 | 90 days supply | Qty: 90 | Fill #1

## 2020-03-31 ENCOUNTER — Encounter: Payer: Self-pay | Admitting: "Endocrinology

## 2020-03-31 ENCOUNTER — Ambulatory Visit: Payer: 59 | Admitting: "Endocrinology

## 2020-03-31 ENCOUNTER — Other Ambulatory Visit: Payer: Self-pay

## 2020-03-31 VITALS — BP 122/69 | HR 63 | Ht 62.0 in | Wt 185.9 lb

## 2020-03-31 DIAGNOSIS — I10 Essential (primary) hypertension: Secondary | ICD-10-CM

## 2020-03-31 DIAGNOSIS — E1165 Type 2 diabetes mellitus with hyperglycemia: Secondary | ICD-10-CM | POA: Diagnosis not present

## 2020-03-31 DIAGNOSIS — E782 Mixed hyperlipidemia: Secondary | ICD-10-CM

## 2020-03-31 DIAGNOSIS — E559 Vitamin D deficiency, unspecified: Secondary | ICD-10-CM

## 2020-03-31 LAB — POCT GLYCOSYLATED HEMOGLOBIN (HGB A1C): Hemoglobin A1C: 6 % — AB (ref 4.0–5.6)

## 2020-03-31 NOTE — Progress Notes (Signed)
03/31/2020, 9:07 AM                                             Endocrinology follow-up note   Subjective:    Patient ID: Alicia Neal, female    DOB: [birth year].  Alicia Neal is being seen in follow-up for management of currently uncontrolled symptomatic diabetes, hyperlipidemia. PMD:   Fayrene Helper, MD.   Past Medical History:  Diagnosis Date  . Adrenal adenoma    right side  . Allergic rhinitis   . Allergy    cat,dog,animal dander  . Anxiety disorder   . Carpal tunnel syndrome of left wrist    had surgery to repair  . Chronic depression    Dr. Cheryln Manly  . GERD (gastroesophageal reflux disease)   . History of abnormal cervical Pap smear   . History of adenomatous polyp of colon   . History of hiatal hernia   . Hyperlipidemia   . Hypertension   . IBS (irritable bowel syndrome)   . OSA (obstructive sleep apnea)    per pt study yrs ago-- CPAP intolerant   . Pemphigoid, cicatricial    pruritic autoimmune blistering skin disorder  . PONV (postoperative nausea and vomiting)   . Pulmonary nodule, right pulmologist-  dr Nathaneil Canary mcquaid   x2    per CT 09-11-2015  . Trigger finger, left    ring finger  . Type 2 diabetes mellitus ([place])   . Uncontrolled type 2 diabetes mellitus with hyperglycemia ([place]) 04/17/2018  . Wears glasses    Past Surgical History:  Procedure Laterality Date  . CARDIOVASCULAR STRESS TEST  04-30-2003   Small focus of decreased perfusion on rest images in the mid-distal anterior wall (worrisome for ischemia),  normal LV function and wall motion , ef 60%  . CARPAL TUNNEL RELEASE Left 11/26/2015   Procedure: CARPAL TUNNEL RELEASE;  Surgeon: Iran Planas, MD;  Location: [facility];  Service: Orthopedics;  Laterality: Left;  . CHOLECYSTECTOMY  1989  . COLONOSCOPY  11/04/2009  . POLYPECTOMY    . TRANSTHORACIC ECHOCARDIOGRAM  04-30-2003   mild hypokinetic in the inferior and posterior wall, ef 50%/  trivial MR /   mild TR  . TRIGGER FINGER RELEASE Left 11/26/2015   Procedure: RELEASE TRIGGER FINGER/A-1 PULLEY LEFT RING  FINGER;  Surgeon: Iran Planas, MD;  Location: [facility];  Service: Orthopedics;  Laterality: Left;  . TUBAL LIGATION  2000   Social History   Socioeconomic History  . Marital status: Single    Spouse name: Not on file  . Number of children: 1  . Years of education: Not on file  . Highest education level: Not on file  Occupational History  . Not on file  Tobacco Use  . Smoking status: Former Smoker    Packs/day: 1.00    Years: 40.00    Pack years: 40.00    Types: Cigarettes    Quit date: 11/07/2013    Years since quitting: 6.4  . Smokeless tobacco: Never Used  Substance and Sexual Activity  . Alcohol use: Yes    Alcohol/week: 0.0 standard drinks    Comment: occasional  . Drug use: No  . Sexual activity: Not Currently    Birth control/protection: Post-menopausal  Other Topics Concern  . Not on file  Social History Narrative  . Not on file   Social Determinants of Health   Financial Resource Strain:   . Difficulty of Paying Living Expenses:   Food Insecurity:   . Worried About Charity fundraiser in the Last Year:   . Arboriculturist in the Last Year:   Transportation Needs:   . Film/video editor (Medical):   Marland Kitchen Lack of Transportation (Non-Medical):   Physical Activity:   . Days of Exercise per Week:   . Minutes of Exercise per Session:   Stress:   . Feeling of Stress :   Social Connections:   . Frequency of Communication with Friends and Family:   . Frequency of Social Gatherings with Friends and Family:   . Attends Religious Services:   . Active Member of Clubs or Organizations:   . Attends Archivist Meetings:   Marland Kitchen Marital Status:    Outpatient Encounter Medications as of 03/31/2020  Medication Sig  . Accu-Chek FastClix Lancets MISC USE TO CHECK BLOOD SUGAR ONCE A DAY  . amLODipine (NORVASC) 10 MG tablet Take one tablet by mouth every evening  for blood pressure  . Cholecalciferol (VITAMIN D) 125 MCG (5000 UT) CAPS TAKE 1 CAPSULE BY MOUTH ONCE A DAY  . Cholecalciferol (VITAMIN D3 PO) Take by mouth as needed.  . citalopram (CELEXA) 20 MG tablet Take 1 tablet (20 mg total) by mouth daily.  Marland Kitchen glucose blood (ACCU-CHEK GUIDE) test strip USE TO CHECK BLOOD SUGAR TWICE DAILY  . Insulin Pen Needle (B-D ULTRAFINE III SHORT PEN) 31G X 8 MM MISC 1 each by Does not apply route as directed.  Marland Kitchen losartan (COZAAR) 100 MG tablet Take 1 tablet (100 mg total) by mouth daily.  . metFORMIN (GLUCOPHAGE) 1000 MG tablet TAKE 1 TABLET BY MOUTH TWICE DAILY WITH MEALS  . mycophenolate (CELLCEPT) 500 MG tablet Take 500 mg by mouth daily.   . pantoprazole (PROTONIX) 40 MG tablet TAKE 1 TABLET (40 MG TOTAL) BY MOUTH DAILY.  Marland Kitchen REPATHA SURECLICK [id number] MG/ML SOAJ INJECT 140 MG INTO THE SKIN EVERY 14 DAYS.  Marland Kitchen TRULICITY 1.5 0000000 SOPN INJECT 1 PEN INTO THE SKIN ONCE A WEEK.  . [DISCONTINUED] insulin degludec (TRESIBA FLEXTOUCH) 100 UNIT/ML SOPN FlexTouch Pen Inject 0.2 mLs (20 Units total) into the skin daily.   No facility-administered encounter medications on file as of 03/31/2020.    ALLERGIES: Allergies  Allergen Reactions  . Ezetimibe-Simvastatin Other (See Comments)    REACTION: muscle aches  . Pravachol [Pravastatin Sodium] Other (See Comments)    Joint pains and memory loss  . Simvastatin Other (See Comments)    REACTION: muscle aches  . Zetia [Ezetimibe] Other (See Comments)    Generalized joint pains    VACCINATION STATUS: Immunization History  Administered Date(s) Administered  . Influenza Split 07/19/2013, 07/22/2014  . Pneumococcal Conjugate-13 06/25/2014  . Pneumococcal Polysaccharide-23 07/29/2019  . Tdap 06/25/2014  . Zoster Recombinat (Shingrix) 06/12/2018    Diabetes She presents for her follow-up diabetic visit. She has type 2 diabetes mellitus. Onset time: She was diagnosed at approximate age of 25 years. Her disease course has  been improving (She reports A1c of 13% at the time of diagnosis, with subsequent improvement to near target range and recent reverse to 8% in May 2019.). Pertinent negatives for hypoglycemia include no confusion, headaches, hunger, pallor or seizures. Pertinent negatives for diabetes include no chest pain, no fatigue, no polydipsia, no polyphagia and no polyuria. There are no  hypoglycemic complications. Symptoms are improving. There are no diabetic complications. Risk factors for coronary artery disease include diabetes mellitus, dyslipidemia, hypertension, tobacco exposure and post-menopausal. Her weight is decreasing steadily. She is following a generally unhealthy diet. When asked about meal planning, she reported none. She has not had a previous visit with a dietitian. She participates in exercise intermittently. Her home blood glucose trend is decreasing steadily. Her breakfast blood glucose range is generally 70-90 mg/dl. Her overall blood glucose range is 70-90 mg/dl. An ACE inhibitor/angiotensin II receptor blocker is being taken. Eye exam is current.  Hyperlipidemia This is a chronic problem. The current episode started more than 1 year ago. The problem is uncontrolled. Recent lipid tests were reviewed and are high. Exacerbating diseases include diabetes. Pertinent negatives include no chest pain, myalgias or shortness of breath. Treatments tried: Patient is not tolerating statins including Livalo. Risk factors for coronary artery disease include dyslipidemia, diabetes mellitus, hypertension and post-menopausal.  Hypertension This is a chronic problem. The current episode started more than 1 year ago. The problem is uncontrolled. Pertinent negatives include no chest pain, headaches, palpitations or shortness of breath. Risk factors for coronary artery disease include dyslipidemia, diabetes mellitus and smoking/tobacco exposure. Past treatments include angiotensin blockers.   Review of  systems  Constitutional: + Minimally fluctuating body weight,  current  Body mass index is 34 kg/m. , no fatigue, no subjective hyperthermia, no subjective hypothermia Eyes: no blurry vision, no xerophthalmia ENT: no sore throat, no nodules palpated in throat, no dysphagia/odynophagia, no hoarseness Cardiovascular: no Chest Pain, no Shortness of Breath, no palpitations, no leg swelling Respiratory: no cough, no shortness of breath Gastrointestinal: no Nausea/Vomiting/Diarhhea Musculoskeletal: no muscle/joint aches Skin: no rashes, no hyperemia Neurological: no tremors, no numbness, no tingling, no dizziness Psychiatric: no depression, no anxiety   Objective:    BP 122/69   Pulse 63   Ht 5\' 2"  (1.575 m)   Wt 185 lb 14.4 oz (84.3 kg)   BMI 34.00 kg/m   Wt Readings from Last 3 Encounters:  03/31/20 185 lb 14.4 oz (84.3 kg)  12/31/19 193 lb (87.5 kg)  12/23/19 194 lb 6.4 oz (88.2 kg)    Physical Exam- Limited  Constitutional:  Body mass index is 34 kg/m. , not in acute distress, normal state of mind Eyes:  EOMI, no exophthalmos Neck: Supple Thyroid: No gross goiter Respiratory: Adequate breathing efforts Musculoskeletal: no gross deformities, strength intact in all four extremities, no gross restriction of joint movements Skin:  no rashes, no hyperemia Neurological: no tremor with outstretched hands,    CMP     Component Value Date/Time   NA 135 01/01/2020 0929   K 4.0 01/01/2020 0929   CL 98 01/01/2020 0929   CO2 26 01/01/2020 0929   GLUCOSE 120 (H) 01/01/2020 0929   BUN 13 01/01/2020 0929   CREATININE 0.61 01/01/2020 0929   CREATININE 0.84 11/18/2016 0844   CALCIUM 9.0 01/01/2020 0929   PROT 7.0 01/01/2020 0929   ALBUMIN 4.2 01/01/2020 0929   AST 31 01/01/2020 0929   ALT 40 01/01/2020 0929   ALKPHOS 59 01/01/2020 0929   BILITOT 0.7 01/01/2020 0929   GFRNONAA >60 01/01/2020 0929   GFRNONAA 77 11/18/2016 0844   GFRAA >60 01/01/2020 0929   GFRAA 89 11/18/2016  0844    Diabetic Labs (most recent): Lab Results  Component Value Date   HGBA1C 6.0 (A) 03/31/2020   HGBA1C 7.2 (A) 11/28/2019   HGBA1C 8.0 (H) 08/01/2019  Lipid Panel ( most recent) Lipid Panel     Component Value Date/Time   CHOL 167 01/01/2020 0929   TRIG 145 01/01/2020 0929   HDL 66 01/01/2020 0929   CHOLHDL 2.5 01/01/2020 0929   VLDL 29 01/01/2020 0929   LDLCALC 72 01/01/2020 0929      Lab Results  Component Value Date   TSH 1.483 08/01/2019   TSH 1.601 06/08/2018   TSH 1.724 06/08/2017   TSH 1.620 06/08/2015   TSH 1.739 06/23/2014   TSH 1.659 04/15/2013   TSH 1.634 01/11/2012   TSH 1.344 11/04/2010   TSH 1.349 10/16/2009   TSH 2.028 01/25/2008   FREET4 0.92 06/08/2018      Assessment & Plan:   1. Uncontrolled type 2 diabetes mellitus with hyperglycemia ([place])  - Alicia Neal has currently uncontrolled symptomatic type 2 DM since 59 years of age. -She presents with improved glycemic profile and point-of-care A1c of 6%, improving from 8%.    -She does not report any gross complications from her diabetes, howeverMonique H Neal remains at a high risk for acute and chronic complications of type 2 diabetes which include CAD, CVA, CKD, retinopathy, and neuropathy. These are all discussed in l with the patient.  - I have counseled her on diet management and weight loss, by adopting a carbohydrate restricted/protein rich diet.  - she  admits there is a room for improvement in her diet and drink choices. -  Suggestion is made for her to avoid simple carbohydrates  from her diet including Cakes, Sweet Desserts / Pastries, Ice Cream, Soda (diet and regular), Sweet Tea, Candies, Chips, Cookies, Sweet Pastries,  Store Bought Juices, Alcohol in Excess of  1-2 drinks a day, Artificial Sweeteners, Coffee Creamer, and "Sugar-free" Products. This will help patient to have stable blood glucose profile and potentially avoid unintended weight gain.  - I encouraged her to  switch to  unprocessed or minimally processed complex starch and increased protein intake (animal or plant source), fruits, and vegetables.  - she is advised to stick to a routine mealtimes to eat 3 meals  a day and avoid unnecessary snacks ( to snack only to correct hypoglycemia).   - she is scheduled  with Alicia Neal, Alicia Neal, Alicia Neal for individualized diabetes education.  - I have approached her with the following individualized plan to manage diabetes and patient agrees:   -Based on her current control and A1c of 6%, she will be tried off of insulin.  She is advised to hold [place] for now.  She will continue Trulicity 1.5 mg subcutaneously weekly, Metformin 1000 mg p.o. twice daily.  Metformin will be switched to ER after she is on her current supplies-1000 mg ER daily.    - Patient specific target  A1c;  LDL, HDL, Triglycerides, were discussed in detail.  2) BP/HTN: Her blood pressure is controlled to target.  She has mild microalbuminuria.  She is advised to continue amlodipine 10 mg daily, continue losartan 100 mg p.o. daily.   3) Lipids/HPL:   Her recent lipid panel showed significantly improved LDL at 72, overall improving from 162.   She has benefited from [place], advised to continue Repatha 140 mg subcutaneously every other week.   she is advised to continue the Livalo 2 mg p.o. nightly.  4)  Weight/Diet: Her BMI 34-lost 8 pounds since last visit.  She would benefit from more weight loss.  Alicia Neal Consult has been  initiated , exercise, and detailed carbohydrates information provided.  5) Chronic Care/Health Maintenance:  -she  is on ACEI/ARB and Statin medications and  is encouraged to continue to follow up with Ophthalmology, Dentist,  Podiatrist at least yearly or according to recommendations, and advised to  stay away from smoking. I have recommended yearly flu vaccine and pneumonia vaccination at least every 5 years; moderate intensity exercise for up to 150 minutes weekly; and   sleep for at least 7 hours a day.  - I advised patient to maintain close follow up with Fayrene Helper, MD for primary care needs.   - Time spent on this patient care encounter:  35 min, of which > 50% was spent in  counseling and the rest reviewing her blood glucose logs , discussing her hypoglycemia and hyperglycemia episodes, reviewing her current and  previous labs / studies  ( including abstraction from other facilities) and medications  doses and developing a  long term treatment plan and documenting her care.   Please refer to Patient Instructions for Blood Glucose Monitoring and Insulin/Medications Dosing Guide"  in media tab for additional information. Please  also refer to " Patient Self Inventory" in the Media  tab for reviewed elements of pertinent patient history.  Alicia Neal participated in the discussions, expressed understanding, and voiced agreement with the above plans.  All questions were answered to her satisfaction. she is encouraged to contact clinic should she have any questions or concerns prior to her return visit.   Follow up plan: - Return in about 4 months (around 08/01/2020) for Bring Meter and Logs- A1c in Office.  Glade Lloyd, MD [facility] [address] [place], [place] [address] Phone: [phone]  Fax: [phone]    03/31/2020, 9:07 AM  This note was partially dictated with voice recognition software. Similar sounding words can be transcribed inadequately or may not  be corrected upon review.

## 2020-04-07 ENCOUNTER — Other Ambulatory Visit: Payer: Self-pay | Admitting: "Endocrinology

## 2020-04-07 MED FILL — METFORMIN HCL 1000 MG TABS: 1000 | 90 days supply | Qty: 180 | Fill #0

## 2020-04-07 MED FILL — PANTOPRAZOLE SOD DR 40 MG T: 40 | 90 days supply | Qty: 90 | Fill #2

## 2020-04-07 MED FILL — REPATHA SURECLICK 140 MG/ML: 140 | 28 days supply | Qty: 2 | Fill #1

## 2020-04-07 MED FILL — MYCOPHENOLATE 500 MG TABLET: 500 | 90 days supply | Qty: 90 | Fill #1

## 2020-04-07 MED FILL — TRULICITY 1.5 MG/0.5 ML PEN: 1.5 | 28 days supply | Qty: 2 | Fill #1

## 2020-04-29 ENCOUNTER — Encounter: Payer: Self-pay | Admitting: Family Medicine

## 2020-04-29 ENCOUNTER — Other Ambulatory Visit: Payer: Self-pay

## 2020-04-29 ENCOUNTER — Ambulatory Visit: Payer: 59 | Admitting: Family Medicine

## 2020-04-29 VITALS — BP 130/72 | HR 66 | Resp 16 | Ht 62.0 in | Wt 179.0 lb

## 2020-04-29 DIAGNOSIS — R9389 Abnormal findings on diagnostic imaging of other specified body structures: Secondary | ICD-10-CM

## 2020-04-29 DIAGNOSIS — E1159 Type 2 diabetes mellitus with other circulatory complications: Secondary | ICD-10-CM

## 2020-04-29 DIAGNOSIS — L121 Cicatricial pemphigoid: Secondary | ICD-10-CM | POA: Diagnosis not present

## 2020-04-29 DIAGNOSIS — K219 Gastro-esophageal reflux disease without esophagitis: Secondary | ICD-10-CM

## 2020-04-29 DIAGNOSIS — E785 Hyperlipidemia, unspecified: Secondary | ICD-10-CM

## 2020-04-29 DIAGNOSIS — E782 Mixed hyperlipidemia: Secondary | ICD-10-CM | POA: Diagnosis not present

## 2020-04-29 DIAGNOSIS — E669 Obesity, unspecified: Secondary | ICD-10-CM

## 2020-04-29 DIAGNOSIS — I10 Essential (primary) hypertension: Secondary | ICD-10-CM

## 2020-04-29 NOTE — Patient Instructions (Addendum)
F/U early November, call if you need me before   Fasting lipid, cmp and EGFR, TSH and microalb 1 week before visit  Congrats on improved health  No med changes  It is important that you exercise regularly at least 30 minutes 5 times a week. If you develop chest pain, have severe difficulty breathing, or feel very tired, stop exercising immediately and seek medical attention   Think about what you will eat, plan ahead. Choose " clean, green, fresh or frozen" over canned, processed or packaged foods which are more sugary, salty and fatty. 70 to 75% of food eaten should be vegetables and fruit. Three meals at set times with snacks allowed between meals, but they must be fruit or vegetables. Aim to eat over a 12 hour period , example 7 am to 7 pm, and STOP after  your last meal of the day. Drink water,generally about 64 ounces per day, no other drink is as healthy. Fruit juice is best enjoyed in a healthy way, by EATING the fruit. Thanks for choosing [facility], we consider it a privelige to serve you.

## 2020-04-29 NOTE — Progress Notes (Signed)
Alicia Neal     MRN: [id number]      DOB: 10/29/1961   HPI Alicia Neal is here for follow up and re-evaluation of chronic medical conditions, medication management and review of any available recent lab and radiology data.  Preventive health is updated, specifically  Cancer screening and Immunization.   This is her has changed her lifestyle with specific attention to improved eating she has cut out sugar with significant weight loss and normalization of her blood sugar.  She continues to exercise regularly.  She has decided to take 4 weeks off of work for the month of August and is looking forward to this time for mentally clearing her head on improving her overall health and wellbeing. Denies polyuria, polydipsia, blurred vision , or hypoglycemic episodes.   ROS Denies recent fever or chills. Denies sinus pressure, nasal congestion, ear pain or sore throat. Denies chest congestion, productive cough or wheezing. Denies chest pains, palpitations and leg swelling Denies abdominal pain, nausea, vomiting,diarrhea or constipation.   Denies dysuria, frequency, hesitancy or incontinence. Neck and right shoulder pain x 1 week, sitting at  Computer on meetings,  Denies headaches, seizures, numbness, or tingling. Denies depression, anxiety or insomnia. Denies skin break down or rash.   PE  BP 130/72   Pulse 66   Resp 16   Ht 5\' 2"  (1.575 m)   Wt 179 lb (81.2 kg)   SpO2 97%   BMI 32.74 kg/m    Patient alert and oriented and in no cardiopulmonary distress.  HEENT: No facial asymmetry, EOMI,     Neck supple .  Chest: Clear to auscultation bilaterally.  CVS: S1, S2 no murmurs, no S3.Regular rate.  ABD: Soft non tender.   Ext: No edema  MS: Adequate ROM spine, shoulders, hips and knees.  Skin: Intact, no ulcerations or rash noted.  Psych: Good eye contact, normal affect. Memory intact not anxious or depressed appearing.  CNS: CN 2-12 intact, power,  normal throughout.no  focal deficits noted.   Assessment & Plan  HTN, goal below 130/80 Controlled, no change in medication DASH diet and commitment to daily physical activity for a minimum of 30 minutes discussed and encouraged, as a part of hypertension management. The importance of attaining a healthy weight is also discussed.  BP/Weight 04/29/2020 03/31/2020 12/31/2019 12/23/2019 12/09/2019 9/52/8413 12/11/4008  Systolic BP 272 536 644 034 - 742 595  Diastolic BP 72 69 82 78 - 76 94  Wt. (Lbs) 179 185.9 193 194.4 194.4 193 192  BMI 32.74 34 35.3 35.56 35.56 35.3 35.12       Hyperlipidemia LDL goal <100 Hyperlipidemia:Low fat diet discussed and encouraged.   Lipid Panel  Lab Results  Component Value Date   CHOL 167 01/01/2020   HDL 66 01/01/2020   LDLCALC 72 01/01/2020   TRIG 145 01/01/2020   CHOLHDL 2.5 01/01/2020     Controlled, no change in medication Updated lab needed at/ before next visit.   GERD (gastroesophageal reflux disease) Controlled, no change in medication   Type 2 diabetes mellitus with vascular disease (HCC) Marked improvement, controlled, managed by Endo Alicia Neal is reminded of the importance of commitment to daily physical activity for 30 minutes or more, as able and the need to limit carbohydrate intake to 30 to 60 grams per meal to help with blood sugar control.   The need to take medication as prescribed, test blood sugar as directed, and to call between visits if there is a  concern that blood sugar is uncontrolled is also discussed.   Alicia Neal is reminded of the importance of daily foot exam, annual eye examination, and good blood sugar, blood pressure and cholesterol control.  Diabetic Labs Latest Ref Rng & Units 03/31/2020 01/01/2020 11/28/2019 08/01/2019 04/04/2019  HbA1c 4.0 - 5.6 % 6.0(A) - 7.2(A) 8.0(H) 7.4(H)  Microalbumin Not Estab. ug/mL - - - 26.1(H) -  Micro/Creat Ratio 0 - 29 mg/g creat - - - 22 -  Chol 0 - 200 mg/dL - 167 - 182 -  HDL >40 mg/dL - 66 -  55 -  Calc LDL 0 - 99 mg/dL - 72 - 102(H) -  Triglycerides <150 mg/dL - 145 - 123 -  Creatinine 0.44 - 1.00 mg/dL - 0.61 - 0.60 0.61   BP/Weight 04/29/2020 03/31/2020 12/31/2019 12/23/2019 12/09/2019 12/19/2480 5/0/0370  Systolic BP 488 891 694 503 - 888 280  Diastolic BP 72 69 82 78 - 76 94  Wt. (Lbs) 179 185.9 193 194.4 194.4 193 192  BMI 32.74 34 35.3 35.56 35.56 35.3 35.12   Foot/eye exam completion dates Latest Ref Rng & Units 03/12/2020 07/29/2019  Eye Exam No Retinopathy No Retinopathy -  Foot Form Completion - - Done        Pemphigoid, cicatricial Controlled, no recent flares on cellcept  Abnormal CT scan, chest Small pulmonary nodules on right has annual lung cancer screening set up, due to over 45 pack year historynext due in 09/2020  Mildly obese  Patient re-educated about  the importance of commitment to a  minimum of 150 minutes of exercise per week as able.  The importance of healthy food choices with portion control discussed, as well as eating regularly and within a 12 hour window most days. The need to choose "clean , green" food 50 to 75% of the time is discussed, as well as to make water the primary drink and set a goal of 64 ounces water daily.    Weight /BMI 04/29/2020 03/31/2020 12/31/2019  WEIGHT 179 lb 185 lb 14.4 oz 193 lb  HEIGHT 5\' 2"  5\' 2"  5\' 2"   BMI 32.74 kg/m2 34 kg/m2 35.3 kg/m2

## 2020-05-01 ENCOUNTER — Encounter: Payer: Self-pay | Admitting: Family Medicine

## 2020-05-01 DIAGNOSIS — E669 Obesity, unspecified: Secondary | ICD-10-CM | POA: Insufficient documentation

## 2020-05-01 NOTE — Assessment & Plan Note (Signed)
Controlled, no recent flares on cellcept

## 2020-05-01 NOTE — Assessment & Plan Note (Signed)
  Patient re-educated about  the importance of commitment to a  minimum of 150 minutes of exercise per week as able.  The importance of healthy food choices with portion control discussed, as well as eating regularly and within a 12 hour window most days. The need to choose "clean , green" food 50 to 75% of the time is discussed, as well as to make water the primary drink and set a goal of 64 ounces water daily.    Weight /BMI 04/29/2020 03/31/2020 12/31/2019  WEIGHT 179 lb 185 lb 14.4 oz 193 lb  HEIGHT 5\' 2"  5\' 2"  5\' 2"   BMI 32.74 kg/m2 34 kg/m2 35.3 kg/m2

## 2020-05-01 NOTE — Assessment & Plan Note (Signed)
Marked improvement, controlled, managed by Endo Ms. Gibbard is reminded of the importance of commitment to daily physical activity for 30 minutes or more, as able and the need to limit carbohydrate intake to 30 to 60 grams per meal to help with blood sugar control.   The need to take medication as prescribed, test blood sugar as directed, and to call between visits if there is a concern that blood sugar is uncontrolled is also discussed.   Ms. Vanyo is reminded of the importance of daily foot exam, annual eye examination, and good blood sugar, blood pressure and cholesterol control.  Diabetic Labs Latest Ref Rng & Units 03/31/2020 01/01/2020 11/28/2019 08/01/2019 04/04/2019  HbA1c 4.0 - 5.6 % 6.0(A) - 7.2(A) 8.0(H) 7.4(H)  Microalbumin Not Estab. ug/mL - - - 26.1(H) -  Micro/Creat Ratio 0 - 29 mg/g creat - - - 22 -  Chol 0 - 200 mg/dL - 167 - 182 -  HDL >40 mg/dL - 66 - 55 -  Calc LDL 0 - 99 mg/dL - 72 - 102(H) -  Triglycerides <150 mg/dL - 145 - 123 -  Creatinine 0.44 - 1.00 mg/dL - 0.61 - 0.60 0.61   BP/Weight 04/29/2020 03/31/2020 12/31/2019 12/23/2019 12/09/2019 4/85/4627 0/01/5008  Systolic BP 381 829 937 169 - 678 938  Diastolic BP 72 69 82 78 - 76 94  Wt. (Lbs) 179 185.9 193 194.4 194.4 193 192  BMI 32.74 34 35.3 35.56 35.56 35.3 35.12   Foot/eye exam completion dates Latest Ref Rng & Units 03/12/2020 07/29/2019  Eye Exam No Retinopathy No Retinopathy -  Foot Form Completion - - Done

## 2020-05-01 NOTE — Assessment & Plan Note (Signed)
Controlled, no change in medication  

## 2020-05-01 NOTE — Assessment & Plan Note (Signed)
Small pulmonary nodules on right has annual lung cancer screening set up, due to over 45 pack year historynext due in 09/2020

## 2020-05-01 NOTE — Assessment & Plan Note (Signed)
Hyperlipidemia:Low fat diet discussed and encouraged.   Lipid Panel  Lab Results  Component Value Date   CHOL 167 01/01/2020   HDL 66 01/01/2020   LDLCALC 72 01/01/2020   TRIG 145 01/01/2020   CHOLHDL 2.5 01/01/2020     Controlled, no change in medication Updated lab needed at/ before next visit.

## 2020-05-01 NOTE — Assessment & Plan Note (Signed)
Controlled, no change in medication DASH diet and commitment to daily physical activity for a minimum of 30 minutes discussed and encouraged, as a part of hypertension management. The importance of attaining a healthy weight is also discussed.  BP/Weight 04/29/2020 03/31/2020 12/31/2019 12/23/2019 12/09/2019 0/10/2240 11/10/6429  Systolic BP 427 670 110 034 - 961 164  Diastolic BP 72 69 82 78 - 76 94  Wt. (Lbs) 179 185.9 193 194.4 194.4 193 192  BMI 32.74 34 35.3 35.56 35.56 35.3 35.12

## 2020-05-07 ENCOUNTER — Other Ambulatory Visit: Payer: Self-pay | Admitting: "Endocrinology

## 2020-05-07 MED FILL — TRULICITY 1.5 MG/0.5 ML PEN: 1.5 | 27 days supply | Qty: 2 | Fill #0

## 2020-05-08 MED FILL — REPATHA SURECLICK 140 MG/ML: 140 | 28 days supply | Qty: 2 | Fill #2

## 2020-05-18 MED FILL — CITALOPRAM HBR 20 MG TABLET: 20 | 90 days supply | Qty: 90 | Fill #2

## 2020-06-03 ENCOUNTER — Other Ambulatory Visit: Payer: Self-pay | Admitting: "Endocrinology

## 2020-06-03 ENCOUNTER — Other Ambulatory Visit: Payer: Self-pay

## 2020-06-03 MED ORDER — TRULICITY 1.5 MG/0.5ML ~~LOC~~ SOAJ: SUBCUTANEOUS | 0 refills | Status: DC

## 2020-06-03 MED FILL — LOSARTAN POTASSIUM 100 MG T: 100 | 90 days supply | Qty: 90 | Fill #5

## 2020-06-03 MED FILL — TRULICITY 1.5 MG/0.5 ML PEN: 1.5 | 28 days supply | Qty: 2 | Fill #0

## 2020-06-22 ENCOUNTER — Other Ambulatory Visit: Payer: Self-pay | Admitting: "Endocrinology

## 2020-06-22 MED FILL — AMLODIPINE BESYLATE 10 MG T: 10 | 90 days supply | Qty: 90 | Fill #0

## 2020-06-22 MED FILL — REPATHA SURECLICK 140 MG/ML: 140 | 28 days supply | Qty: 2 | Fill #3

## 2020-07-02 ENCOUNTER — Other Ambulatory Visit: Payer: Self-pay

## 2020-07-02 DIAGNOSIS — E1165 Type 2 diabetes mellitus with hyperglycemia: Secondary | ICD-10-CM

## 2020-07-02 MED ORDER — TRULICITY 1.5 MG/0.5ML ~~LOC~~ SOAJ: SUBCUTANEOUS | 5 refills | Status: DC

## 2020-07-02 MED FILL — TRULICITY 1.5 MG/0.5 ML PEN: 1.5 | 84 days supply | Qty: 6 | Fill #0

## 2020-07-02 MED FILL — METFORMIN HCL 1000 MG TABS: 1000 | 90 days supply | Qty: 180 | Fill #1

## 2020-07-06 ENCOUNTER — Other Ambulatory Visit (HOSPITAL_COMMUNITY)
Admission: RE | Admit: 2020-07-06 | Discharge: 2020-07-06 | Disposition: A | Discharge status: Home / Self Care | Payer: 59 | Source: Ambulatory Visit | Attending: Obstetrics & Gynecology | Admitting: Obstetrics & Gynecology

## 2020-07-06 ENCOUNTER — Ambulatory Visit (INDEPENDENT_AMBULATORY_CARE_PROVIDER_SITE_OTHER): Payer: 59 | Admitting: Women's Health

## 2020-07-06 ENCOUNTER — Encounter: Payer: Self-pay | Admitting: Women's Health

## 2020-07-06 VITALS — BP 142/83 | HR 61 | Ht 62.0 in | Wt 176.0 lb

## 2020-07-06 DIAGNOSIS — Z01419 Encounter for gynecological examination (general) (routine) without abnormal findings: Secondary | ICD-10-CM | POA: Insufficient documentation

## 2020-07-06 NOTE — Progress Notes (Signed)
[facility] EXAMINATION Patient name: Alicia Neal MRN [id number]  Date of birth: 18-Jun-1961 Chief Complaint:   Gynecologic Exam  History of Present Illness:   Alicia Neal is a 59 y.o. G70P2 Caucasian female being seen today for a routine well-woman exam.  Current complaints: none  Depression screen [facility] 2/9 07/06/2020 04/29/2020 11/13/2019 07/29/2019 07/29/2019  Decreased Interest 0 0 0 0 0  Down, Depressed, Hopeless 1 0 0 0 0  PHQ - 2 Score 1 0 0 0 0  Altered sleeping 1 - - 3 -  Tired, decreased energy 0 - - 0 -  Change in appetite 0 - - 3 -  Feeling bad or failure about yourself  - - - 3 -  Trouble concentrating 0 - - 0 -  Moving slowly or fidgety/restless 0 - - 0 -  Suicidal thoughts 0 - - 0 -  PHQ-9 Score 2 - - 9 -  Difficult doing work/chores - - - Not difficult at all -  Some recent data might be hidden     PCP: Dr. Moshe Cipro      does not desire labs, done w/ PCP No LMP recorded. Patient is postmenopausal. The current method of family planning is post menopausal status and tubal ligation.  Last pap 09/25/17. Results were: normal. H/O abnormal pap: yes h/o abnormal pap w/ colpo and crysosurgery w/ 5 subsequent normal paps Last mammogram: 11/04/19. Results were: normal. Family h/o breast cancer: no Last colonoscopy: 12/23/19. Results were: tubular adenoma polyps removed, f/u 61yrs. Family h/o colorectal cancer: yes maternal grandparents and [facility] Review of Systems:   Pertinent items are noted in HPI Denies any headaches, blurred vision, fatigue, shortness of breath, chest pain, abdominal pain, abnormal vaginal discharge/itching/odor/irritation, problems with periods, bowel movements, urination, or intercourse unless otherwise stated above. Pertinent History Reviewed:  Reviewed past medical,surgical, social and family history.  Reviewed problem list, medications and allergies. Physical Assessment:   Vitals:   07/06/20 0835  BP: (!) 142/83  Pulse: 61  Weight: 176 lb (79.8  kg)  Height: 5\' 2"  (1.575 m)  Body mass index is 32.19 kg/m.        Physical Examination:   General appearance - well appearing, and in no distress  Mental status - alert, oriented to person, place, and time  Psych:  She has a normal mood and affect  Skin - warm and dry, normal color, no suspicious lesions noted  Chest - effort normal, all lung fields clear to auscultation bilaterally  Heart - normal rate and regular rhythm  Neck:  midline trachea, no thyromegaly or nodules  Breasts - breasts appear normal, no suspicious masses, no skin or nipple changes or  axillary nodes  Abdomen - soft, nontender, nondistended, no masses or organomegaly  Pelvic - VULVA: normal appearing vulva with no masses, tenderness or lesions  VAGINA: normal appearing vagina with normal color and discharge, no lesions  CERVIX: normal appearing cervix without discharge or lesions, no CMT  Thin prep pap is done w/ HR HPV cotesting  UTERUS: uterus is felt to be normal size, shape, consistency and nontender   ADNEXA: No adnexal masses or tenderness noted.  Extremities:  No swelling or varicosities noted  Chaperone: Amanda Rash    No results found for this or any previous visit (from the past 24 hour(s)).  Assessment & Plan:  1) Well-Woman Exam  2) Postmenonopausal> doing well, instructed to notify us if ever has vaginal bleeding  3) CHTN, T2DM, hyperlipidemia> all managed  by PCP & Dr. Dorris Fetch  Labs/procedures today: pap  Mammogram after 12/28, or sooner if problems Colonoscopy in 76yrs per GI, or sooner if problems  No orders of the defined types were placed in this encounter.   Meds: No orders of the defined types were placed in this encounter.   Follow-up: Return in about 1 year (around 07/06/2021) for Physical.  [place], WHNP-BC 07/06/2020 9:10 AM

## 2020-07-08 LAB — CYTOLOGY - PAP
Comment: NEGATIVE
Diagnosis: NEGATIVE
High risk HPV: NEGATIVE

## 2020-07-16 ENCOUNTER — Other Ambulatory Visit: Payer: Self-pay | Admitting: Family Medicine

## 2020-07-16 MED FILL — MYCOPHENOLATE 500 MG TABLET: 500 | 90 days supply | Qty: 90 | Fill #2

## 2020-07-16 MED FILL — PANTOPRAZOLE SOD DR 40 MG T: 40 | 90 days supply | Qty: 90 | Fill #0

## 2020-08-03 ENCOUNTER — Other Ambulatory Visit (HOSPITAL_COMMUNITY)
Admission: RE | Admit: 2020-08-03 | Discharge: 2020-08-03 | Disposition: A | Discharge status: Home / Self Care | Payer: 59 | Source: Ambulatory Visit | Attending: "Endocrinology | Admitting: "Endocrinology

## 2020-08-03 ENCOUNTER — Other Ambulatory Visit: Payer: Self-pay

## 2020-08-03 DIAGNOSIS — E782 Mixed hyperlipidemia: Secondary | ICD-10-CM | POA: Diagnosis not present

## 2020-08-03 DIAGNOSIS — E1165 Type 2 diabetes mellitus with hyperglycemia: Secondary | ICD-10-CM | POA: Insufficient documentation

## 2020-08-03 DIAGNOSIS — E559 Vitamin D deficiency, unspecified: Secondary | ICD-10-CM | POA: Diagnosis not present

## 2020-08-03 LAB — LIPID PANEL
Cholesterol: 195 mg/dL (ref 0–200)
HDL: 70 mg/dL (ref >40)
LDL Cholesterol: 99 mg/dL (ref 0–99)
Total CHOL/HDL Ratio: 2.8 RATIO
Triglycerides: 131 mg/dL (ref <150)
VLDL: 26 mg/dL (ref 0–40)

## 2020-08-03 LAB — COMPREHENSIVE METABOLIC PANEL
ALT: 24 U/L (ref 0–44)
AST: 20 U/L (ref 15–41)
Albumin: 4.3 g/dL (ref 3.5–5.0)
Alkaline Phosphatase: 55 U/L (ref 38–126)
Anion gap: 12 (ref 5–15)
BUN: 13 mg/dL (ref 8–23)
CO2: 26 mmol/L (ref 22–32)
Calcium: 9.1 mg/dL (ref 8.9–10.3)
Chloride: 97 mmol/L — ABNORMAL LOW (ref 98–111)
Creatinine, Ser: 0.59 mg/dL (ref 0.44–1.00)
GFR calc Af Amer: >60 mL/min (ref >60)
GFR calc non Af Amer: >60 mL/min (ref >60)
Glucose, Bld: 123 mg/dL — ABNORMAL HIGH (ref 70–99)
Potassium: 4.3 mmol/L (ref 3.5–5.1)
Sodium: 135 mmol/L (ref 135–145)
Total Bilirubin: 0.7 mg/dL (ref 0.3–1.2)
Total Protein: 7.2 g/dL (ref 6.5–8.1)

## 2020-08-03 LAB — VITAMIN D 25 HYDROXY (VIT D DEFICIENCY, FRACTURES): Vit D, 25-Hydroxy: 61.6 ng/mL (ref 30–100)

## 2020-08-03 LAB — TSH: TSH: 1.827 u[IU]/mL (ref 0.350–4.500)

## 2020-08-04 LAB — MICROALBUMIN, URINE: Microalb, Ur: <3 ug/mL — ABNORMAL HIGH

## 2020-08-05 ENCOUNTER — Ambulatory Visit: Payer: 59 | Admitting: Nurse Practitioner

## 2020-08-05 ENCOUNTER — Other Ambulatory Visit: Payer: Self-pay

## 2020-08-05 ENCOUNTER — Encounter: Payer: Self-pay | Admitting: Nurse Practitioner

## 2020-08-05 VITALS — BP 111/73 | HR 57 | Ht 62.0 in | Wt 177.8 lb

## 2020-08-05 DIAGNOSIS — E1165 Type 2 diabetes mellitus with hyperglycemia: Secondary | ICD-10-CM

## 2020-08-05 LAB — POCT GLYCOSYLATED HEMOGLOBIN (HGB A1C): Hemoglobin A1C: 6 % — AB (ref 4.0–5.6)

## 2020-08-05 NOTE — Patient Instructions (Signed)

## 2020-08-05 NOTE — Progress Notes (Signed)
08/05/2020, 8:40 AM                                             Endocrinology follow-up note   Subjective:    Patient ID: Alicia Neal, female    DOB: Sep 08, 1961.  Alicia Neal is being seen in follow-up for management of currently uncontrolled symptomatic diabetes, hyperlipidemia. PMD:   Fayrene Helper, MD.   Past Medical History:  Diagnosis Date  . Adrenal adenoma    right side  . Allergic rhinitis   . Allergy    cat,dog,animal dander  . Anxiety disorder   . Carpal tunnel syndrome of left wrist    had surgery to repair  . Chronic depression    Dr. Cheryln Manly  . GERD (gastroesophageal reflux disease)   . History of abnormal cervical Pap smear   . History of adenomatous polyp of colon   . History of hiatal hernia   . Hyperlipidemia   . Hypertension   . IBS (irritable bowel syndrome)   . OSA (obstructive sleep apnea)    per pt study yrs ago-- CPAP intolerant   . Pemphigoid, cicatricial    pruritic autoimmune blistering skin disorder  . PONV (postoperative nausea and vomiting)   . Pulmonary nodule, right pulmologist-  dr Nathaneil Canary mcquaid   x2    per CT 09-11-2015  . Trigger finger, left    ring finger  . Type 2 diabetes mellitus ([place])   . Uncontrolled type 2 diabetes mellitus with hyperglycemia ([place]) 04/17/2018  . Wears glasses    Past Surgical History:  Procedure Laterality Date  . CARDIOVASCULAR STRESS TEST  04-30-2003   Small focus of decreased perfusion on rest images in the mid-distal anterior wall (worrisome for ischemia),  normal LV function and wall motion , ef 60%  . CARPAL TUNNEL RELEASE Left 11/26/2015   Procedure: CARPAL TUNNEL RELEASE;  Surgeon: Iran Planas, MD;  Location: [facility];  Service: Orthopedics;  Laterality: Left;  . CHOLECYSTECTOMY  1989  . COLONOSCOPY  11/04/2009  . POLYPECTOMY    . TRANSTHORACIC ECHOCARDIOGRAM  04-30-2003   mild hypokinetic in the inferior and posterior wall, ef 50%/  trivial MR /   mild TR  . TRIGGER FINGER RELEASE Left 11/26/2015   Procedure: RELEASE TRIGGER FINGER/A-1 PULLEY LEFT RING  FINGER;  Surgeon: Iran Planas, MD;  Location: [facility];  Service: Orthopedics;  Laterality: Left;  . TUBAL LIGATION  2000   Social History   Socioeconomic History  . Marital status: Single    Spouse name: Not on file  . Number of children: 1  . Years of education: Not on file  . Highest education level: Not on file  Occupational History  . Not on file  Tobacco Use  . Smoking status: Former Smoker    Packs/day: 1.00    Years: 40.00    Pack years: 40.00    Types: Cigarettes    Quit date: 11/07/2013    Years since quitting: 6.7  . Smokeless tobacco: Never Used  Vaping Use  . Vaping Use: Never used  Substance and Sexual Activity  . Alcohol use: Yes    Alcohol/week: 0.0 standard drinks    Comment: occasional  . Drug use: No  . Sexual activity: Not Currently    Birth control/protection: Post-menopausal  Other Topics Concern  . Not on file  Social History Narrative  . Not on file   Social Determinants of Health   Financial Resource Strain: Low Risk   . Difficulty of Paying Living Expenses: Not hard at all  Food Insecurity: No Food Insecurity  . Worried About Charity fundraiser in the Last Year: Never true  . Ran Out of Food in the Last Year: Never true  Transportation Needs: No Transportation Needs  . Lack of Transportation (Medical): No  . Lack of Transportation (Non-Medical): No  Physical Activity: Sufficiently Active  . Days of Exercise per Week: 7 days  . Minutes of Exercise per Session: 50 min  Stress: Stress Concern Present  . Feeling of Stress : To some extent  Social Connections: Moderately Integrated  . Frequency of Communication with Friends and Family: More than three times a week  . Frequency of Social Gatherings with Friends and Family: Twice a week  . Attends Religious Services: 1 to 4 times per year  . Active Member of Clubs or Organizations: No  .  Attends Archivist Meetings: Never  . Marital Status: Living with partner   Outpatient Encounter Medications as of 08/05/2020  Medication Sig  . Accu-Chek FastClix Lancets MISC USE TO CHECK BLOOD SUGAR ONCE A DAY  . amLODipine (NORVASC) 10 MG tablet TAKE 1 TABLET BY MOUTH EVERY EVENING FOR BLOOD PRESSURE  . citalopram (CELEXA) 20 MG tablet Take 1 tablet (20 mg total) by mouth daily.  . Dulaglutide (TRULICITY) 1.5 IZ/1.2WP SOPN INJECT 1 PEN INTO THE SKIN ONCE A WEEK.  Marland Kitchen glucose blood (ACCU-CHEK GUIDE) test strip USE TO CHECK BLOOD SUGAR TWICE DAILY  . Insulin Pen Needle (B-D ULTRAFINE III SHORT PEN) 31G X 8 MM MISC 1 each by Does not apply route as directed.  . loratadine (CLARITIN) 10 MG tablet Take 10 mg by mouth daily.  Marland Kitchen losartan (COZAAR) 100 MG tablet Take 1 tablet (100 mg total) by mouth daily.  . metFORMIN (GLUCOPHAGE) 1000 MG tablet TAKE 1 TABLET BY MOUTH TWICE DAILY WITH MEALS  . mycophenolate (CELLCEPT) 500 MG tablet Take 500 mg by mouth daily.   . pantoprazole (PROTONIX) 40 MG tablet TAKE 1 TABLET (40 MG TOTAL) BY MOUTH DAILY.  Marland Kitchen REPATHA SURECLICK 809 MG/ML SOAJ INJECT 140 MG INTO THE SKIN EVERY 14 DAYS.  Marland Kitchen Cholecalciferol (VITAMIN D) 125 MCG (5000 UT) CAPS TAKE 1 CAPSULE BY MOUTH ONCE A DAY (Patient not taking: Reported on 07/06/2020)  . Cholecalciferol (VITAMIN D3 PO) Take by mouth as needed. (Patient not taking: Reported on 07/06/2020)   No facility-administered encounter medications on file as of 08/05/2020.    ALLERGIES: Allergies  Allergen Reactions  . Ezetimibe-Simvastatin Other (See Comments)    REACTION: muscle aches  . Pravachol [Pravastatin Sodium] Other (See Comments)    Joint pains and memory loss  . Simvastatin Other (See Comments)    REACTION: muscle aches  . Zetia [Ezetimibe] Other (See Comments)    Generalized joint pains    VACCINATION STATUS: Immunization History  Administered Date(s) Administered  . Influenza Split 07/19/2013, 07/22/2014  .  Moderna SARS-COVID-2 Vaccination 11/05/2019, 11/13/2019  . Pneumococcal Conjugate-13 06/25/2014  . Pneumococcal Polysaccharide-23 07/29/2019  . Tdap 06/25/2014  . Zoster Recombinat (Shingrix) 06/12/2018    Diabetes She presents for her follow-up diabetic visit. She has type 2 diabetes mellitus. Onset time: She was diagnosed at approximate age of 57 years. Her disease course has been improving (She reports A1c of  13% at the time of diagnosis, with subsequent improvement to near target range and recent reverse to 8% in May 2019.). Pertinent negatives for hypoglycemia include no confusion, headaches, hunger, pallor or seizures. Pertinent negatives for diabetes include no chest pain, no fatigue, no polydipsia, no polyphagia and no polyuria. There are no hypoglycemic complications. Symptoms are improving. There are no diabetic complications. Risk factors for coronary artery disease include diabetes mellitus, dyslipidemia, hypertension, tobacco exposure and post-menopausal. Current diabetic treatment includes oral agent (monotherapy). She is compliant with treatment all of the time. Her weight is decreasing steadily. She is following a generally unhealthy diet. When asked about meal planning, she reported none. She has not had a previous visit with a dietitian. She participates in exercise intermittently. Her home blood glucose trend is decreasing steadily. Her breakfast blood glucose range is generally 130-140 mg/dl. (Patient presents today without her meter or logs.  She routinely checks her fasting blood glucose which ranges from 130-140 most mornings.  Her POCT A1C today is 6%, unchanged from last visit.  She has done well off of her [place].) An ACE inhibitor/angiotensin II receptor blocker is being taken. She does not see a podiatrist.Eye exam is current.  Hyperlipidemia This is a chronic problem. The current episode started more than 1 year ago. The problem is controlled. Recent lipid tests were reviewed  and are normal. Exacerbating diseases include diabetes. There are no known factors aggravating her hyperlipidemia. Pertinent negatives include no chest pain, myalgias or shortness of breath. Treatments tried: Patient is not tolerating statins including [place].  She is on Repatha. The current treatment provides mild improvement of lipids. There are no compliance problems.  Risk factors for coronary artery disease include dyslipidemia, diabetes mellitus, hypertension and post-menopausal.  Hypertension This is a chronic problem. The current episode started more than 1 year ago. The problem has been gradually improving since onset. The problem is controlled. Pertinent negatives include no chest pain, headaches, palpitations or shortness of breath. There are no associated agents to hypertension. Risk factors for coronary artery disease include dyslipidemia, diabetes mellitus and smoking/tobacco exposure. Past treatments include angiotensin blockers and calcium channel blockers. The current treatment provides moderate improvement. There are no compliance problems.    Review of systems  Constitutional: + Minimally fluctuating body weight,  current Body mass index is 32.52 kg/m. , no fatigue, no subjective hyperthermia, no subjective hypothermia Eyes: no blurry vision, no xerophthalmia ENT: no sore throat, no nodules palpated in throat, no dysphagia/odynophagia, no hoarseness Cardiovascular: no chest pain, no shortness of breath, no palpitations, no leg swelling Respiratory: no cough, no shortness of breath Gastrointestinal: no nausea/vomiting/diarrhea Musculoskeletal: no muscle/joint aches Skin: no rashes, no hyperemia Neurological: no tremors, no numbness, no tingling, no dizziness Psychiatric: no depression, no anxiety   Objective:    BP 111/73 (BP Location: Left Arm, Patient Position: Sitting)   Pulse (!) 57   Ht 5\' 2"  (1.575 m)   Wt 177 lb 12.8 oz (80.6 kg)   BMI 32.52 kg/m   Wt Readings from  Last 3 Encounters:  08/05/20 177 lb 12.8 oz (80.6 kg)  07/06/20 176 lb (79.8 kg)  04/29/20 179 lb (81.2 kg)   BP Readings from Last 3 Encounters:  08/05/20 111/73  07/06/20 (!) 142/83  04/29/20 130/72   Physical Exam- Limited  Constitutional:  Body mass index is 32.52 kg/m. , not in acute distress, normal state of mind Eyes:  EOMI, no exophthalmos Neck: Supple Thyroid: No gross goiter Respiratory: Adequate breathing efforts  Musculoskeletal: no gross deformities, strength intact in all four extremities, no gross restriction of joint movements Skin:  no rashes, no hyperemia Neurological: no tremor with outstretched hands,   Foot exam:   No rashes, ulcers, cuts, calluses, onychodystrophy.   Good pulses bilat.  Good sensation to 10 g monofilament bilat.  POCT ABI performed: Right ABI: 1.36 ; Left ABI 1.38 Report to be scanned into chart.   CMP     Component Value Date/Time   NA 135 08/03/2020 0939   K 4.3 08/03/2020 0939   CL 97 (L) 08/03/2020 0939   CO2 26 08/03/2020 0939   GLUCOSE 123 (H) 08/03/2020 0939   BUN 13 08/03/2020 0939   CREATININE 0.59 08/03/2020 0939   CREATININE 0.84 11/18/2016 0844   CALCIUM 9.1 08/03/2020 0939   PROT 7.2 08/03/2020 0939   ALBUMIN 4.3 08/03/2020 0939   AST 20 08/03/2020 0939   ALT 24 08/03/2020 0939   ALKPHOS 55 08/03/2020 0939   BILITOT 0.7 08/03/2020 0939   GFRNONAA >60 08/03/2020 0939   GFRNONAA 77 11/18/2016 0844   GFRAA >60 08/03/2020 0939   GFRAA 89 11/18/2016 0844    Diabetic Labs (most recent): Lab Results  Component Value Date   HGBA1C 6.0 (A) 03/31/2020   HGBA1C 7.2 (A) 11/28/2019   HGBA1C 8.0 (H) 08/01/2019     Lipid Panel ( most recent) Lipid Panel     Component Value Date/Time   CHOL 195 08/03/2020 0939   TRIG 131 08/03/2020 0939   HDL 70 08/03/2020 0939   CHOLHDL 2.8 08/03/2020 0939   VLDL 26 08/03/2020 0939   LDLCALC 99 08/03/2020 0939      Lab Results  Component Value Date   TSH 1.827  08/03/2020   TSH 1.483 08/01/2019   TSH 1.601 06/08/2018   TSH 1.724 06/08/2017   TSH 1.620 06/08/2015   TSH 1.739 06/23/2014   TSH 1.659 04/15/2013   TSH 1.634 01/11/2012   TSH 1.344 11/04/2010   TSH 1.349 10/16/2009   FREET4 0.92 06/08/2018      Assessment & Plan:   1. Uncontrolled type 2 diabetes mellitus with hyperglycemia ([place])  - Alicia Neal has currently uncontrolled symptomatic type 2 DM since 59 years of age.  Patient presents today without her meter or logs.  She routinely checks her fasting blood glucose which ranges from 130-140 most mornings.  Her POCT A1C today is 6%, unchanged from last visit.  She has done well off of her [place].   -She does not report any gross complications from her diabetes, however she remains at a high risk for acute and chronic complications of type 2 diabetes which include CAD, CVA, CKD, retinopathy, and neuropathy. These are all discussed in l with the patient.  - I have counseled her on diet management and weight loss, by adopting a carbohydrate restricted/protein rich diet.  - The patient admits there is a room for improvement in their diet and drink choices. -  Suggestion is made for the patient to avoid simple carbohydrates from their diet including Cakes, Sweet Desserts / Pastries, Ice Cream, Soda (diet and regular), Sweet Tea, Candies, Chips, Cookies, Sweet Pastries,  Store Bought Juices, Alcohol in Excess of  1-2 drinks a day, Artificial Sweeteners, Coffee Creamer, and "Sugar-free" Products. This will help patient to have stable blood glucose profile and potentially avoid unintended weight gain.   - I encouraged the patient to switch to  unprocessed or minimally processed complex starch and increased protein intake (animal  or plant source), fruits, and vegetables.   - Patient is advised to stick to a routine mealtimes to eat 3 meals  a day and avoid unnecessary snacks ( to snack only to correct hypoglycemia).  - she has seen Jearld Fenton, RDN, CDE for individualized diabetes education.  - I have approached her with the following individualized plan to manage diabetes and patient agrees:   -Based on her current control and A1c of 6%, she will stay off insulin for now.  She is advised to continue Trulicity 1.5 mg SQ weekly and Metformin 1000 mg po twice daily with meals.  She will be changed to Metformin 1000 mg ER once she has exhausted her current supply.  - Patient specific target  A1c;  LDL, HDL, Triglycerides, were discussed in detail.  2) BP/HTN:  Her blood pressure is controlled to target.  She is advised to continue Norvasc 10 mg po daily and Losartan 100 mg po daily.  3) Lipids/HPL:  Her most recent lipid panel from 08/03/20 shows borderline high LDL of 99.  She is advised to continue Repatha 140 mg SQ every other week.  She is intolerant to statins.  4)  Weight/Diet:  Her Body mass index is 32.52 kg/m.  She would benefit from more weight loss.  CDE Consult has been  initiated , exercise, and detailed carbohydrates information provided.  5) Chronic Care/Health Maintenance: -she  is on ACEI/ARB medications and is encouraged to continue to follow up with Ophthalmology, Dentist,  Podiatrist at least yearly or according to recommendations, and advised to  stay away from smoking. I have recommended yearly flu vaccine and pneumonia vaccination at least every 5 years; moderate intensity exercise for up to 150 minutes weekly; and  sleep for at least 7 hours a day.  - I advised patient to maintain close follow up with Fayrene Helper, MD for primary care needs.   - Time spent on this patient care encounter:  35 min, of which > 50% was spent in  counseling and the rest reviewing her blood glucose logs , discussing her hypoglycemia and hyperglycemia episodes, reviewing her current and  previous labs / studies  ( including abstraction from other facilities) and medications  doses and developing a  long term treatment  plan and documenting her care.   Please refer to Patient Instructions for Blood Glucose Monitoring and Insulin/Medications Dosing Guide"  in media tab for additional information. Please  also refer to " Patient Self Inventory" in the Media  tab for reviewed elements of pertinent patient history.  Alicia Neal participated in the discussions, expressed understanding, and voiced agreement with the above plans.  All questions were answered to her satisfaction. she is encouraged to contact clinic should she have any questions or concerns prior to her return visit.  Follow up plan: - Return in about 6 months (around 02/02/2021) for Diabetes follow up, Previsit labs, Virtual visit ok.  Rayetta Pigg, [facility] [address] [place], [place] [address] Phone: [phone] Fax: [phone]  08/05/2020, 8:40 AM  This note was partially dictated with voice recognition software. Similar sounding words can be transcribed inadequately or may not  be corrected upon review.

## 2020-08-06 ENCOUNTER — Other Ambulatory Visit: Payer: Self-pay | Admitting: Family Medicine

## 2020-08-06 ENCOUNTER — Other Ambulatory Visit: Payer: Self-pay | Admitting: "Endocrinology

## 2020-08-06 MED FILL — VITAMIN D3 5,000 UNIT CAP: 125 MCG | 100 days supply | Qty: 100 | Fill #0

## 2020-08-06 NOTE — Telephone Encounter (Signed)
Please advise, I didn't see this addressed in office visit.

## 2020-08-15 ENCOUNTER — Other Ambulatory Visit: Payer: Self-pay | Admitting: "Endocrinology

## 2020-08-15 MED FILL — CITALOPRAM HBR 20 MG TABLET: 20 | 90 days supply | Qty: 90 | Fill #3

## 2020-08-17 ENCOUNTER — Other Ambulatory Visit: Payer: Self-pay | Admitting: Nurse Practitioner

## 2020-08-17 MED FILL — REPATHA SURECLICK 140 MG/ML: 140 | 28 days supply | Qty: 2 | Fill #0

## 2020-08-24 ENCOUNTER — Other Ambulatory Visit: Payer: Self-pay | Admitting: Family Medicine

## 2020-08-25 ENCOUNTER — Other Ambulatory Visit: Payer: Self-pay

## 2020-08-25 MED ORDER — BLOOD GLUCOSE METER KIT: PACK | 0 refills | Status: DC

## 2020-08-25 MED FILL — FREESTYLE LANCETS: 90 days supply | Qty: 100 | Fill #0

## 2020-08-25 MED FILL — FREESTYLE LITE METER: W/DEVICE | 30 days supply | Qty: 1 | Fill #0

## 2020-08-25 MED FILL — FREESTYLE LITE TEST STRIP: 90 days supply | Qty: 100 | Fill #0

## 2020-08-26 ENCOUNTER — Other Ambulatory Visit: Payer: Self-pay

## 2020-08-26 MED ORDER — GLUCOSE BLOOD VI STRP: ORAL_STRIP | 2 refills | Status: DC

## 2020-08-31 ENCOUNTER — Other Ambulatory Visit: Payer: Self-pay | Admitting: Family Medicine

## 2020-08-31 MED FILL — LOSARTAN POTASSIUM 100 MG T: 100 | 90 days supply | Qty: 90 | Fill #0

## 2020-09-21 MED FILL — AMLODIPINE BESYLATE 10 MG T: 10 | 90 days supply | Qty: 90 | Fill #1

## 2020-09-23 ENCOUNTER — Encounter: Payer: Self-pay | Admitting: Family Medicine

## 2020-09-23 ENCOUNTER — Ambulatory Visit (INDEPENDENT_AMBULATORY_CARE_PROVIDER_SITE_OTHER): Payer: 59 | Admitting: Family Medicine

## 2020-09-23 ENCOUNTER — Other Ambulatory Visit: Payer: Self-pay

## 2020-09-23 VITALS — BP 124/78 | HR 73 | Resp 16 | Ht 61.5 in | Wt 182.0 lb

## 2020-09-23 DIAGNOSIS — E785 Hyperlipidemia, unspecified: Secondary | ICD-10-CM | POA: Diagnosis not present

## 2020-09-23 DIAGNOSIS — E1159 Type 2 diabetes mellitus with other circulatory complications: Secondary | ICD-10-CM

## 2020-09-23 DIAGNOSIS — L121 Cicatricial pemphigoid: Secondary | ICD-10-CM

## 2020-09-23 DIAGNOSIS — F411 Generalized anxiety disorder: Secondary | ICD-10-CM

## 2020-09-23 DIAGNOSIS — I10 Essential (primary) hypertension: Secondary | ICD-10-CM | POA: Diagnosis not present

## 2020-09-23 DIAGNOSIS — E559 Vitamin D deficiency, unspecified: Secondary | ICD-10-CM

## 2020-09-23 DIAGNOSIS — E782 Mixed hyperlipidemia: Secondary | ICD-10-CM | POA: Diagnosis not present

## 2020-09-23 DIAGNOSIS — R9389 Abnormal findings on diagnostic imaging of other specified body structures: Secondary | ICD-10-CM

## 2020-09-23 NOTE — Assessment & Plan Note (Signed)
Up to date with annual screening

## 2020-09-23 NOTE — Progress Notes (Signed)
   Alicia Neal     MRN: [id number]      DOB: 1960/12/12   HPI Alicia Neal is here for follow up and re-evaluation of chronic medical conditions, medication management and review of any available recent lab and radiology data.  Preventive health is updated, specifically  Cancer screening and Immunization.   Questions or concerns regarding consultations or procedures which the PT has had in the interim are  addressed. The PT denies any adverse reactions to current medications since the last visit.  There are no new concerns.  There are no specific complaints  Denies polyuria, polydipsia, blurred vision , or hypoglycemic episodes. Continues to exercise regularly , and her recent 4 week leave of absence has been excellent for her health as she intentionally and deliberately reassessed priorities in her life, and is now striving for improved work/ life balance  ROS Denies recent fever or chills. Denies sinus pressure, nasal congestion, ear pain or sore throat. Denies chest congestion, productive cough or wheezing. Denies chest pains, palpitations and leg swelling Denies abdominal pain, nausea, vomiting,diarrhea or constipation.   Denies dysuria, frequency, hesitancy or incontinence. Denies joint pain, swelling and limitation in mobility. Denies headaches, seizures, numbness, or tingling. Denies depression, anxiety or insomnia. Denies skin break down or rash.   PE  BP 124/78   Pulse 73   Resp 16   Ht 5' 1.5" (1.562 m)   Wt 182 lb (82.6 kg)   SpO2 100%   BMI 33.83 kg/m   Patient alert and oriented and in no cardiopulmonary distress.  HEENT: No facial asymmetry, EOMI,     Neck supple .  Chest: Clear to auscultation bilaterally.  CVS: S1, S2 no murmurs, no S3.Regular rate.  ABD: Soft non tender.   Ext: No edema  MS: Adequate ROM spine, shoulders, hips and knees.  Skin: Intact, no ulcerations or rash noted.  Psych: Good eye contact, normal affect. Memory intact not  anxious or depressed appearing.  CNS: CN 2-12 intact, power,  normal throughout.no focal deficits noted.   Assessment & Plan  Hyperlipidemia LDL goal <100 Hyperlipidemia:Low fat diet discussed and encouraged.   Lipid Panel  Lab Results  Component Value Date   CHOL 195 08/03/2020   HDL 70 08/03/2020   LDLCALC 99 08/03/2020   TRIG 131 08/03/2020   CHOLHDL 2.8 08/03/2020   Controlled, no change in medication     Essential hypertension, benign Controlled, no change in medication   GAD (generalized anxiety disorder) Controlled, no change in medication   Abnormal CT scan, chest Up to date with annual screening  Pemphigoid, cicatricial Stable no flare in past 12 months, followed annually by specialist

## 2020-09-23 NOTE — Patient Instructions (Addendum)
F/U in first week in April, call if you need me before  CONGRATS on excellent health habits,keep this up   Pls scheduule mammogram at [facility] when due in  [place], morning appointment please  P[place]aser get fasting lipid panel,TSH and vit D on same day as you get labs for Dr Dorris Fetch

## 2020-09-23 NOTE — Assessment & Plan Note (Signed)
Stable no flare in past 12 months, followed annually by specialist

## 2020-09-23 NOTE — Assessment & Plan Note (Signed)
Hyperlipidemia:Low fat diet discussed and encouraged.   Lipid Panel  Lab Results  Component Value Date   CHOL 195 08/03/2020   HDL 70 08/03/2020   LDLCALC 99 08/03/2020   TRIG 131 08/03/2020   CHOLHDL 2.8 08/03/2020   Controlled, no change in medication

## 2020-09-23 NOTE — Assessment & Plan Note (Signed)
Controlled, no change in medication  

## 2020-09-26 MED FILL — REPATHA SURECLICK 140 MG/ML: 140 | 28 days supply | Qty: 2 | Fill #1

## 2020-09-26 MED FILL — TRULICITY 1.5 MG/0.5 ML PEN: 1.5 | 84 days supply | Qty: 6 | Fill #0

## 2020-09-28 ENCOUNTER — Other Ambulatory Visit: Payer: Self-pay

## 2020-09-28 DIAGNOSIS — E1165 Type 2 diabetes mellitus with hyperglycemia: Secondary | ICD-10-CM

## 2020-09-28 MED ORDER — TRULICITY 1.5 MG/0.5ML ~~LOC~~ SOAJ: SUBCUTANEOUS | 1 refills | Status: DC

## 2020-09-30 ENCOUNTER — Ambulatory Visit (HOSPITAL_COMMUNITY): Payer: 59

## 2020-09-30 ENCOUNTER — Ambulatory Visit: Payer: 59 | Admitting: Family Medicine

## 2020-10-05 ENCOUNTER — Other Ambulatory Visit: Payer: Self-pay | Admitting: "Endocrinology

## 2020-10-05 ENCOUNTER — Ambulatory Visit (HOSPITAL_COMMUNITY)
Admission: RE | Admit: 2020-10-05 | Discharge: 2020-10-05 | Disposition: A | Discharge status: Home / Self Care | Payer: 59 | Source: Ambulatory Visit | Attending: Acute Care | Admitting: Acute Care

## 2020-10-05 ENCOUNTER — Other Ambulatory Visit: Payer: Self-pay

## 2020-10-05 ENCOUNTER — Other Ambulatory Visit: Payer: Self-pay | Admitting: Nurse Practitioner

## 2020-10-05 DIAGNOSIS — Z87891 Personal history of nicotine dependence: Secondary | ICD-10-CM | POA: Insufficient documentation

## 2020-10-05 DIAGNOSIS — Z122 Encounter for screening for malignant neoplasm of respiratory organs: Secondary | ICD-10-CM | POA: Diagnosis not present

## 2020-10-05 MED FILL — METFORMIN HCL 1000 MG TABS: 1000 | 90 days supply | Qty: 180 | Fill #0

## 2020-10-05 MED FILL — MYCOPHENOLATE 500 MG TABLET: 500 | 90 days supply | Qty: 90 | Fill #3

## 2020-10-05 NOTE — Progress Notes (Signed)
Please call patient and let them  know their  low dose Ct was read as a Lung RADS 2: nodules that are benign in appearance and behavior with a very low likelihood of becoming a clinically active cancer due to size or lack of growth. Recommendation per radiology is for a repeat LDCT in 12 months. .Please let them  know we will order and schedule their  annual screening scan for 09/2021. Please let them  know there was notation of CAD on their  scan.  Please remind the patient  that this is a non-gated exam therefore degree or severity of disease  cannot be determined. Please have them  follow up with their PCP regarding potential risk factor modification, dietary therapy or pharmacologic therapy if clinically indicated. Pt.  is not  currently on statin therapy. Please place order for annual  screening scan for  09/2021 and fax results to PCP. Thanks so much. 

## 2020-10-05 NOTE — Progress Notes (Signed)
Please let her know there was notation of Hepatic steatosis. This  is a term that describes the build up of fat in the liver. It is normal to have small amounts of fat in your liver, but when the proportion of liver cells that contain fat exceeds more than 5% it is indicative of early stage fatty liver.Treatment often involves reducing risk factors through a diet and exercise plan. It is generally a benign condition, but in a small percentage of patients it does require follow up. Please have the patient follow up with PCP regarding potential risk factor modification, dietary therapy or pharmacologic therapy if clinically indicated.  Thanks so much

## 2020-10-08 ENCOUNTER — Other Ambulatory Visit: Payer: Self-pay | Admitting: *Deleted

## 2020-10-08 DIAGNOSIS — Z87891 Personal history of nicotine dependence: Secondary | ICD-10-CM

## 2020-10-25 MED FILL — PANTOPRAZOLE SOD DR 40 MG T: 40 | 90 days supply | Qty: 90 | Fill #1

## 2020-11-03 ENCOUNTER — Other Ambulatory Visit: Payer: Self-pay | Admitting: Family Medicine

## 2020-11-03 MED FILL — CITALOPRAM HBR 20 MG TABLET: 20 | 90 days supply | Qty: 90 | Fill #0

## 2020-11-03 MED FILL — REPATHA SURECLICK 140 MG/ML: 140 | 28 days supply | Qty: 2 | Fill #2

## 2020-11-23 MED FILL — LOSARTAN POTASSIUM 100 MG T: 100 | 90 days supply | Qty: 90 | Fill #1

## 2020-12-07 ENCOUNTER — Other Ambulatory Visit (HOSPITAL_COMMUNITY): Payer: Self-pay | Admitting: Family Medicine

## 2020-12-07 DIAGNOSIS — Z1231 Encounter for screening mammogram for malignant neoplasm of breast: Secondary | ICD-10-CM

## 2020-12-09 MED FILL — REPATHA SURECLICK 140 MG/ML: 140 | 28 days supply | Qty: 2 | Fill #3

## 2020-12-16 ENCOUNTER — Ambulatory Visit (HOSPITAL_COMMUNITY)
Admission: RE | Admit: 2020-12-16 | Discharge: 2020-12-16 | Disposition: A | Discharge status: Home / Self Care | Payer: 59 | Source: Ambulatory Visit | Attending: Family Medicine | Admitting: Family Medicine

## 2020-12-16 ENCOUNTER — Other Ambulatory Visit: Payer: Self-pay

## 2020-12-16 DIAGNOSIS — Z1231 Encounter for screening mammogram for malignant neoplasm of breast: Secondary | ICD-10-CM | POA: Diagnosis not present

## 2020-12-22 ENCOUNTER — Other Ambulatory Visit: Payer: Self-pay | Admitting: "Endocrinology

## 2020-12-22 ENCOUNTER — Other Ambulatory Visit: Payer: Self-pay

## 2020-12-22 ENCOUNTER — Other Ambulatory Visit: Payer: Self-pay | Admitting: Nurse Practitioner

## 2020-12-22 DIAGNOSIS — I10 Essential (primary) hypertension: Secondary | ICD-10-CM

## 2020-12-22 MED ORDER — AMLODIPINE BESYLATE 10 MG PO TABS: 10.0000 mg | ORAL_TABLET | Freq: Every day | ORAL | 0 refills | Status: DC

## 2020-12-22 MED FILL — AMLODIPINE BESYLATE 10 MG T: 10 | 90 days supply | Qty: 90 | Fill #0

## 2020-12-30 ENCOUNTER — Other Ambulatory Visit: Payer: Self-pay | Admitting: Family

## 2020-12-30 ENCOUNTER — Telehealth: Payer: 59 | Admitting: Family

## 2020-12-30 DIAGNOSIS — M25511 Pain in right shoulder: Secondary | ICD-10-CM | POA: Diagnosis not present

## 2020-12-30 MED ORDER — PREDNISONE 10 MG (21) PO TBPK: ORAL_TABLET | ORAL | 0 refills | Status: DC

## 2020-12-30 MED ORDER — DICLOFENAC SODIUM 75 MG PO TBEC: 75.0000 mg | DELAYED_RELEASE_TABLET | Freq: Two times a day (BID) | ORAL | 0 refills | Status: DC

## 2020-12-30 MED FILL — predniSONE 10 MG (21) TBPK: 10 | 6 days supply | Qty: 21 | Fill #0

## 2020-12-30 NOTE — Progress Notes (Signed)
We are sorry that you are not feeling well.  Here is how we plan to help!  Based on what you have shared with me it looks like you mostly have acute shoulder pain and possible some carpal tunnel.   I recommend getting a wrist splint and wearing while using your computer or phone and sleep with it every night. I have sent a prescription of diclofenac 75 mg twice a day and a prednisone dose pack.    Home Care  Stay active.  Start with short walks on flat ground if you can.  Try to walk farther each day.  Do not sit, drive or stand in one place for more than 30 minutes.  Do not stay in bed.  Do not avoid exercise or work.  Activity can help your back heal faster.  Be careful when you bend or lift an object.  Bend at your knees, keep the object close to you, and do not twist.  Sleep on a firm mattress.  Lie on your side, and bend your knees.  If you lie on your back, put a pillow under your knees.  Only take medicines as told by your doctor.  Put ice on the injured area.  Put ice in a plastic bag  Place a towel between your skin and the bag  Leave the ice on for 15-20 minutes, 3-4 times a day for the first 2-3 days. 210 After that, you can switch between ice and heat packs.  Ask your doctor about back exercises or massage.  Avoid feeling anxious or stressed.  Find good ways to deal with stress, such as exercise.  Get Help Right Way If:  Your pain does not go away with rest or medicine.  Your pain does not go away in 1 week.  You have new problems.  You do not feel well.  The pain spreads into your legs.  You cannot control when you poop (bowel movement) or pee (urinate)  You feel sick to your stomach (nauseous) or throw up (vomit)  You have belly (abdominal) pain.  You feel like you may pass out (faint).  If you develop a fever.  Make Sure you:  Understand these instructions.  Will watch your condition  Will get help right away if you are not doing well or get  worse.  Your e-visit answers were reviewed by a board certified advanced clinical practitioner to complete your personal care plan.  Depending on the condition, your plan could have included both over the counter or prescription medications.  If there is a problem please reply  once you have received a response from your provider.  Your safety is important to [place].  If you have drug allergies check your prescription carefully.    You can use MyChart to ask questions about today's visit, request a non-urgent call back, or ask for a work or school excuse for 24 hours related to this e-Visit. If it has been greater than 24 hours you will need to follow up with your provider, or enter a new e-Visit to address those concerns.  You will get an e-mail in the next two days asking about your experience.  I hope that your e-visit has been valuable and will speed your recovery. Thank you for using e-visits.  Approximately 5 minutes was spent documenting and reviewing patient's chart.

## 2021-01-02 ENCOUNTER — Other Ambulatory Visit: Payer: Self-pay | Admitting: Nurse Practitioner

## 2021-01-02 MED FILL — TRULICITY 1.5 MG/0.5 ML PEN: 1.5 | 84 days supply | Qty: 6 | Fill #1

## 2021-01-04 ENCOUNTER — Other Ambulatory Visit: Payer: Self-pay | Admitting: Nurse Practitioner

## 2021-01-04 MED FILL — REPATHA SURECLICK 140 MG/ML: 140 | 28 days supply | Qty: 2 | Fill #0

## 2021-01-06 MED FILL — METFORMIN HCL 1000 MG TABS: 1000 | 90 days supply | Qty: 180 | Fill #1

## 2021-01-25 ENCOUNTER — Other Ambulatory Visit (HOSPITAL_COMMUNITY)
Admission: RE | Admit: 2021-01-25 | Discharge: 2021-01-25 | Disposition: A | Discharge status: Home / Self Care | Payer: 59 | Source: Ambulatory Visit | Attending: Nurse Practitioner | Admitting: Nurse Practitioner

## 2021-01-25 ENCOUNTER — Other Ambulatory Visit: Payer: Self-pay

## 2021-01-25 ENCOUNTER — Other Ambulatory Visit (HOSPITAL_COMMUNITY)
Admission: RE | Admit: 2021-01-25 | Discharge: 2021-01-25 | Disposition: A | Discharge status: Home / Self Care | Payer: 59 | Source: Ambulatory Visit | Attending: Family Medicine | Admitting: Family Medicine

## 2021-01-25 DIAGNOSIS — E559 Vitamin D deficiency, unspecified: Secondary | ICD-10-CM | POA: Insufficient documentation

## 2021-01-25 DIAGNOSIS — E1159 Type 2 diabetes mellitus with other circulatory complications: Secondary | ICD-10-CM | POA: Diagnosis not present

## 2021-01-25 DIAGNOSIS — E782 Mixed hyperlipidemia: Secondary | ICD-10-CM | POA: Diagnosis not present

## 2021-01-25 DIAGNOSIS — E1165 Type 2 diabetes mellitus with hyperglycemia: Secondary | ICD-10-CM | POA: Insufficient documentation

## 2021-01-25 LAB — COMPREHENSIVE METABOLIC PANEL
ALT: 29 U/L (ref 0–44)
AST: 24 U/L (ref 15–41)
Albumin: 4.2 g/dL (ref 3.5–5.0)
Alkaline Phosphatase: 61 U/L (ref 38–126)
Anion gap: 11 (ref 5–15)
BUN: 12 mg/dL (ref 8–23)
CO2: 24 mmol/L (ref 22–32)
Calcium: 9 mg/dL (ref 8.9–10.3)
Chloride: 98 mmol/L (ref 98–111)
Creatinine, Ser: 0.65 mg/dL (ref 0.44–1.00)
GFR, Estimated: >60 mL/min (ref >60)
Glucose, Bld: 159 mg/dL — ABNORMAL HIGH (ref 70–99)
Potassium: 4.1 mmol/L (ref 3.5–5.1)
Sodium: 133 mmol/L — ABNORMAL LOW (ref 135–145)
Total Bilirubin: 0.5 mg/dL (ref 0.3–1.2)
Total Protein: 7.2 g/dL (ref 6.5–8.1)

## 2021-01-25 LAB — HEMOGLOBIN A1C
Hgb A1c MFr Bld: 7.5 % — ABNORMAL HIGH (ref 4.8–5.6)
Mean Plasma Glucose: 168.55 mg/dL

## 2021-01-25 LAB — LIPID PANEL
Cholesterol: 165 mg/dL (ref 0–200)
HDL: 65 mg/dL (ref >40)
LDL Cholesterol: 76 mg/dL (ref 0–99)
Total CHOL/HDL Ratio: 2.5 RATIO
Triglycerides: 121 mg/dL (ref <150)
VLDL: 24 mg/dL (ref 0–40)

## 2021-01-25 LAB — VITAMIN D 25 HYDROXY (VIT D DEFICIENCY, FRACTURES): Vit D, 25-Hydroxy: 53.05 ng/mL (ref 30–100)

## 2021-01-25 LAB — TSH: TSH: 2.047 u[IU]/mL (ref 0.350–4.500)

## 2021-01-26 ENCOUNTER — Other Ambulatory Visit (HOSPITAL_BASED_OUTPATIENT_CLINIC_OR_DEPARTMENT_OTHER): Payer: Self-pay

## 2021-01-26 LAB — MICROALBUMIN / CREATININE URINE RATIO
Creatinine, Urine: 55.8 mg/dL
Microalb Creat Ratio: <5 mg/g creat (ref 0–29)
Microalb, Ur: <3 ug/mL — ABNORMAL HIGH

## 2021-01-26 MED FILL — PANTOPRAZOLE SOD DR 40 MG T: 40 | 90 days supply | Qty: 90 | Fill #2

## 2021-01-27 ENCOUNTER — Other Ambulatory Visit (HOSPITAL_COMMUNITY): Payer: Self-pay | Admitting: Dermatology

## 2021-01-27 MED FILL — MYCOPHENOLATE 500 MG TABLET: 500 | 90 days supply | Qty: 90 | Fill #0

## 2021-02-02 ENCOUNTER — Ambulatory Visit: Payer: 59 | Admitting: Nurse Practitioner

## 2021-02-02 ENCOUNTER — Encounter: Payer: Self-pay | Admitting: Nurse Practitioner

## 2021-02-02 ENCOUNTER — Other Ambulatory Visit: Payer: Self-pay

## 2021-02-02 VITALS — BP 124/80 | HR 64 | Ht 61.5 in | Wt 185.0 lb

## 2021-02-02 DIAGNOSIS — E782 Mixed hyperlipidemia: Secondary | ICD-10-CM

## 2021-02-02 DIAGNOSIS — E559 Vitamin D deficiency, unspecified: Secondary | ICD-10-CM

## 2021-02-02 DIAGNOSIS — E1165 Type 2 diabetes mellitus with hyperglycemia: Secondary | ICD-10-CM | POA: Diagnosis not present

## 2021-02-02 DIAGNOSIS — I10 Essential (primary) hypertension: Secondary | ICD-10-CM | POA: Diagnosis not present

## 2021-02-02 DIAGNOSIS — Z789 Other specified health status: Secondary | ICD-10-CM | POA: Diagnosis not present

## 2021-02-02 NOTE — Patient Instructions (Signed)

## 2021-02-02 NOTE — Progress Notes (Signed)
02/02/2021, 8:46 AM                                             Endocrinology follow-up note   Subjective:    Neal ID: Alicia Neal, female    DOB: Sep 14, 1961.  Alicia Neal is being seen in follow-up for management of currently uncontrolled symptomatic diabetes, hyperlipidemia. PMD:   Fayrene Helper, MD.   Past Medical History:  Diagnosis Date  . Adrenal adenoma    right side  . Allergic rhinitis   . Allergy    cat,dog,animal dander  . Anxiety disorder   . Carpal tunnel syndrome of left wrist    had surgery to repair  . Chronic depression    Dr. Cheryln Manly  . GERD (gastroesophageal reflux disease)   . History of abnormal cervical Pap smear   . History of adenomatous polyp of colon   . History of hiatal hernia   . Hyperlipidemia   . Hypertension   . IBS (irritable bowel syndrome)   . OSA (obstructive sleep apnea)    per pt study yrs ago-- CPAP intolerant   . Pemphigoid, cicatricial    pruritic autoimmune blistering skin disorder  . PONV (postoperative nausea and vomiting)   . Pulmonary nodule, right pulmologist-  dr Nathaneil Canary mcquaid   x2    per CT 09-11-2015  . Trigger finger, left    ring finger  . Type 2 diabetes mellitus ([place])   . Uncontrolled type 2 diabetes mellitus with hyperglycemia ([place]) 04/17/2018  . Wears glasses    Past Surgical History:  Procedure Laterality Date  . CARDIOVASCULAR STRESS TEST  04-30-2003   Small focus of decreased perfusion on rest images in Alicia mid-distal anterior wall (worrisome for ischemia),  normal LV function and wall motion , ef 60%  . CARPAL TUNNEL RELEASE Left 11/26/2015   Procedure: CARPAL TUNNEL RELEASE;  Surgeon: Iran Planas, MD;  Location: [place];  Service: Orthopedics;  Laterality: Left;  . CHOLECYSTECTOMY  1989  . COLONOSCOPY  11/04/2009  . POLYPECTOMY    . TRANSTHORACIC ECHOCARDIOGRAM  04-30-2003   mild hypokinetic in Alicia inferior and posterior wall, ef 50%/  trivial MR /   mild TR  . TRIGGER FINGER RELEASE Left 11/26/2015   Procedure: RELEASE TRIGGER FINGER/A-1 PULLEY LEFT RING  FINGER;  Surgeon: Iran Planas, MD;  Location: [place];  Service: Orthopedics;  Laterality: Left;  . TUBAL LIGATION  2000   Social History   Socioeconomic History  . Marital status: Single    Spouse name: Not on file  . Number of children: 1  . Years of education: Not on file  . Highest education level: Not on file  Occupational History  . Not on file  Tobacco Use  . Smoking status: Former Smoker    Packs/day: 1.00    Years: 40.00    Pack years: 40.00    Types: Cigarettes    Quit date: 11/07/2013    Years since quitting: 7.2  . Smokeless tobacco: Never Used  Vaping Use  . Vaping Use: Never used  Substance and Sexual Activity  . Alcohol use: Yes    Alcohol/week: 0.0 standard drinks    Comment: occasional  . Drug use: No  . Sexual activity: Not Currently    Birth control/protection: Post-menopausal  Other Topics Concern  . Not on file  Social History Narrative  . Not on file   Social Determinants of Health   Financial Resource Strain: Low Risk   . Difficulty of Paying Living Expenses: Not hard at all  Food Insecurity: No Food Insecurity  . Worried About Charity fundraiser in [place] Last Year: Never true  . Ran Out of Food in [place] Last Year: Never true  Transportation Needs: No Transportation Needs  . Lack of Transportation (Medical): No  . Lack of Transportation (Non-Medical): No  Physical Activity: Sufficiently Active  . Days of Exercise per Week: 7 days  . Minutes of Exercise per Session: 50 min  Stress: Stress Concern Present  . Feeling of Stress : To some extent  Social Connections: Moderately Integrated  . Frequency of Communication with Friends and Family: More than three times a week  . Frequency of Social Gatherings with Friends and Family: Twice a week  . Attends Religious Services: 1 to 4 times per year  . Active Member of Clubs or Organizations: No  .  Attends Archivist Meetings: Never  . Marital Status: Living with partner   Outpatient Encounter Medications as of 02/02/2021  Medication Sig  . amLODipine (NORVASC) 10 MG tablet Take 1 tablet (10 mg total) by mouth daily.  . blood glucose meter kit and supplies Dispense Freestyle meter and supplies for once daily testing dx e11.9  . Cholecalciferol (VITAMIN D) 125 MCG (5000 UT) CAPS TAKE 1 CAPSULE BY MOUTH ONCE A DAY  . citalopram (CELEXA) 20 MG tablet TAKE 1 TABLET (20 MG TOTAL) BY MOUTH DAILY.  . Dulaglutide (TRULICITY) 1.5 IO/9.6EX SOPN INJECT 1 PEN INTO Alicia SKIN ONCE A WEEK.  Marland Kitchen glucose blood test strip Use to check blood sugar twice daily.   Accu-Chek Guide Test Strip  . loratadine (CLARITIN) 10 MG tablet Take 10 mg by mouth daily.  Marland Kitchen losartan (COZAAR) 100 MG tablet TAKE 1 TABLET BY MOUTH ONCE DAILY  . metFORMIN (GLUCOPHAGE) 1000 MG tablet TAKE 1 TABLET BY MOUTH TWICE DAILY WITH MEALS  . mycophenolate (CELLCEPT) 500 MG tablet Take 500 mg by mouth daily.   . pantoprazole (PROTONIX) 40 MG tablet TAKE 1 TABLET (40 MG TOTAL) BY MOUTH DAILY.  Marland Kitchen REPATHA SURECLICK 528 MG/ML SOAJ INJECT 140 MG INTO Alicia SKIN EVERY 14 DAYS  . [DISCONTINUED] diclofenac (VOLTAREN) 75 MG EC tablet Take 1 tablet (75 mg total) by mouth 2 (two) times daily.  . [DISCONTINUED] Insulin Pen Needle (B-D ULTRAFINE III SHORT PEN) 31G X 8 MM MISC 1 each by Does not apply route as directed.  . [DISCONTINUED] predniSONE (STERAPRED UNI-PAK 21 TAB) 10 MG (21) TBPK tablet Use as directed   No facility-administered encounter medications on file as of 02/02/2021.    ALLERGIES: Allergies  Allergen Reactions  . Ezetimibe-Simvastatin Other (See Comments)    REACTION: muscle aches  . Pravachol [Pravastatin Sodium] Other (See Comments)    Joint pains and memory loss  . Simvastatin Other (See Comments)    REACTION: muscle aches  . Zetia [Ezetimibe] Other (See Comments)    Generalized joint pains    VACCINATION  STATUS: Immunization History  Administered Date(s) Administered  . Influenza Split 07/19/2013, 07/22/2014  . Influenza-Unspecified 07/28/2020  . Moderna Sars-Covid-2 Vaccination 11/05/2019, 11/13/2019, 09/10/2020  . Pneumococcal Conjugate-13 06/25/2014  . Pneumococcal Polysaccharide-23 07/29/2019  . Tdap 06/25/2014  . Zoster Recombinat (Shingrix) 06/12/2018    Diabetes Alicia Neal presents for Alicia Neal follow-up diabetic visit. Alicia Neal  has type 2 diabetes mellitus. Onset time: Alicia Neal was diagnosed at approximate age of 27 years. Alicia Neal disease course has been stable (Alicia Neal reports A1c of 13% at Alicia time of diagnosis, with subsequent improvement to near target range and recent reverse to 8% in May 2019.). Pertinent negatives for hypoglycemia include no confusion, headaches, hunger, pallor or seizures. Pertinent negatives for diabetes include no chest pain, no fatigue, no polydipsia, no polyphagia and no polyuria. There are no hypoglycemic complications. Symptoms are stable. There are no diabetic complications. Risk factors for coronary artery disease include diabetes mellitus, dyslipidemia, hypertension, tobacco exposure and post-menopausal. Current diabetic treatment includes oral agent (monotherapy). Alicia Neal is compliant with treatment all of Alicia time. Alicia Neal weight is fluctuating minimally. Alicia Neal is following a generally healthy diet. When asked about meal planning, Alicia Neal reported none. Alicia Neal has not had a previous visit with a dietitian. Alicia Neal participates in exercise intermittently. Alicia Neal home blood glucose trend is fluctuating minimally. (Alicia Neal presents today with no meter or logs to review.  Alicia Neal does routinely monitor glucose in Alicia mornings.  Alicia Neal previsit A1c was 7.5%, increasing from previous visit of 6%.  Alicia Neal reports Alicia Neal went through some emotional distress in December and indulged in foods Alicia Neal knows were not good for Alicia Neal.  Alicia Neal is starting to get back on track now and is improving.  Alicia Neal denies any s/s of hypoglycemia.) An ACE  inhibitor/angiotensin II receptor blocker is being taken. Alicia Neal does not see a podiatrist.Eye exam is current.  Hyperlipidemia This is a chronic problem. Alicia current episode started more than 1 year ago. Alicia problem is controlled. Recent lipid tests were reviewed and are normal. Exacerbating diseases include diabetes. There are no known factors aggravating Alicia Neal hyperlipidemia. Pertinent negatives include no chest pain, myalgias or shortness of breath. Treatments tried: Neal is not tolerating statins including Livalo.  Alicia Neal is on Repatha. Alicia current treatment provides moderate improvement of lipids. There are no compliance problems.  Risk factors for coronary artery disease include dyslipidemia, diabetes mellitus, hypertension and post-menopausal.  Hypertension This is a chronic problem. Alicia current episode started more than 1 year ago. Alicia problem has been gradually improving since onset. Alicia problem is controlled. Pertinent negatives include no chest pain, headaches, palpitations or shortness of breath. There are no associated agents to hypertension. Risk factors for coronary artery disease include dyslipidemia, diabetes mellitus and smoking/tobacco exposure. Past treatments include calcium channel blockers and angiotensin blockers. Alicia current treatment provides moderate improvement. There are no compliance problems.  Identifiable causes of hypertension include sleep apnea.   Review of systems  Constitutional: + Minimally fluctuating body weight,  current Body mass index is 34.39 kg/m. , no fatigue, no subjective hyperthermia, no subjective hypothermia Eyes: no blurry vision, no xerophthalmia ENT: no sore throat, no nodules palpated in throat, no dysphagia/odynophagia, no hoarseness Cardiovascular: no chest pain, no shortness of breath, no palpitations, no leg swelling Respiratory: no cough, no shortness of breath Gastrointestinal: no nausea/vomiting/diarrhea Musculoskeletal: no muscle/joint  aches Skin: no rashes, no hyperemia Neurological: no tremors, no numbness, no tingling, no dizziness Psychiatric: no depression, no anxiety   Objective:    BP 124/80   Pulse 64   Ht 5' 1.5" (1.562 m)   Wt 185 lb (83.9 kg)   BMI 34.39 kg/m   Wt Readings from Last 3 Encounters:  02/02/21 185 lb (83.9 kg)  09/23/20 182 lb (82.6 kg)  08/05/20 177 lb 12.8 oz (80.6 kg)   BP Readings from Last 3 Encounters:  02/02/21 124/80  09/23/20 124/78  08/05/20 111/73    Physical Exam- Limited  Constitutional:  Body mass index is 34.39 kg/m. , not in acute distress, normal state of mind Eyes:  EOMI, no exophthalmos Neck: Supple Cardiovascular: RRR, no murmers, rubs, or gallops, no edema Respiratory: Adequate breathing efforts, no crackles, rales, rhonchi, or wheezing Musculoskeletal: no gross deformities, strength intact in all four extremities, no gross restriction of joint movements Skin:  no rashes, no hyperemia Neurological: no tremor with outstretched hands    CMP     Component Value Date/Time   NA 133 (L) 01/25/2021 0857   K 4.1 01/25/2021 0857   CL 98 01/25/2021 0857   CO2 24 01/25/2021 0857   GLUCOSE 159 (H) 01/25/2021 0857   BUN 12 01/25/2021 0857   CREATININE 0.65 01/25/2021 0857   CREATININE 0.84 11/18/2016 0844   CALCIUM 9.0 01/25/2021 0857   PROT 7.2 01/25/2021 0857   ALBUMIN 4.2 01/25/2021 0857   AST 24 01/25/2021 0857   ALT 29 01/25/2021 0857   ALKPHOS 61 01/25/2021 0857   BILITOT 0.5 01/25/2021 0857   GFRNONAA >60 01/25/2021 0857   GFRNONAA 77 11/18/2016 0844   GFRAA >60 08/03/2020 0939   GFRAA 89 11/18/2016 0844    Diabetic Labs (most recent): Lab Results  Component Value Date   HGBA1C 7.5 (H) 01/25/2021   HGBA1C 6.0 (A) 08/05/2020   HGBA1C 6.0 (A) 03/31/2020     Lipid Panel ( most recent) Lipid Panel     Component Value Date/Time   CHOL 165 01/25/2021 0857   TRIG 121 01/25/2021 0857   HDL 65 01/25/2021 0857   CHOLHDL 2.5 01/25/2021 0857    VLDL 24 01/25/2021 0857   LDLCALC 76 01/25/2021 0857      Lab Results  Component Value Date   TSH 2.047 01/25/2021   TSH 1.827 08/03/2020   TSH 1.483 08/01/2019   TSH 1.601 06/08/2018   TSH 1.724 06/08/2017   TSH 1.620 06/08/2015   TSH 1.739 06/23/2014   TSH 1.659 04/15/2013   TSH 1.634 01/11/2012   TSH 1.344 11/04/2010   FREET4 0.92 06/08/2018      Assessment & Plan:   1) Uncontrolled type 2 diabetes mellitus with hyperglycemia ([place])  - Alicia Neal has currently uncontrolled symptomatic type 2 DM since 60 years of age.   Alicia Neal presents today with no meter or logs to review.  Alicia Neal does routinely monitor glucose in Alicia mornings.  Alicia Neal previsit A1c was 7.5%, increasing from previous visit of 6%.  Alicia Neal reports Alicia Neal went through some emotional distress in December and indulged in foods Alicia Neal knows were not good for Alicia Neal.  Alicia Neal is starting to get back on track now and is improving.  Alicia Neal denies any s/s of hypoglycemia.  -Alicia Neal does not report any gross complications from Alicia Neal diabetes, however Alicia Neal remains at a high risk for acute and chronic complications of type 2 diabetes which include CAD, CVA, CKD, retinopathy, and neuropathy. These are all discussed in l with Alicia Neal.  - Nutritional counseling repeated at each appointment due to patients tendency to fall back in to old habits.  - Alicia Neal admits there is a room for improvement in their diet and drink choices. -  Suggestion is made for Alicia Neal to avoid simple carbohydrates from their diet including Cakes, Sweet Desserts / Pastries, Ice Cream, Soda (diet and regular), Sweet Tea, Candies, Chips, Cookies, Sweet Pastries,  Store Bought Juices, Alcohol in Excess of 1-2 drinks a day, Artificial  Sweeteners, Coffee Creamer, and "Sugar-free" Products. This will help Neal to have stable blood glucose profile and potentially avoid unintended weight gain.   - I encouraged Alicia Neal to switch to unprocessed or minimally processed complex  starch and increased protein intake (animal or plant source), fruits, and vegetables.   - Neal is advised to stick to a routine mealtimes to eat 3 meals  a day and avoid unnecessary snacks ( to snack only to correct hypoglycemia).  - Alicia Neal has seen Jearld Fenton, RDN, CDE for individualized diabetes education.  - I have approached Alicia Neal with Alicia following individualized plan to manage diabetes and Neal agrees:   -Although Alicia Neal A1c is slightly worse today, Alicia Neal does not need to go back on insulin at this time.  Alicia Neal is advised to continue current medication regimen of Metformin 1000 mg twice daily with meals and Trulicity 1.5 mg SQ weekly for now.  Alicia Neal is also strongly encouraged to work on diet and exercise to maintain control of Alicia Neal diabetes.   - Neal specific target  A1c;  LDL, HDL, Triglycerides, were discussed in detail.  2) BP/HTN:  Alicia Neal blood pressure is controlled to target.  Alicia Neal is advised to continue Norvasc 10 mg po daily and Losartan 100 mg po daily.  3) Lipids/HPL:  Alicia Neal most recent lipid panel from 01/25/21 shows controlled LDL of 75- improving since starting Repatha.  Alicia Neal is advised to continue Repatha 140 mg SQ every other week.  Alicia Neal is intolerant to statins.  4)  Weight/Diet:  Alicia Neal Body mass index is 34.39 kg/m.  Alicia Neal would benefit from more weight loss.  CDE Consult has been  initiated , exercise, and detailed carbohydrates information provided.  5) Chronic Care/Health Maintenance: -Alicia Neal is on ACEI/ARB medications and is encouraged to continue to follow up with Ophthalmology, Dentist, Podiatrist at least yearly or according to recommendations, and advised to stay away from smoking. I have recommended yearly flu vaccine and pneumonia vaccination at least every 5 years; moderate intensity exercise for up to 150 minutes weekly; and  sleep for at least 7 hours a day.  - I advised Neal to maintain close follow up with Fayrene Helper, MD for primary care needs.   - Time  spent on this Neal care encounter:  40 min, of which > 50% was spent in  counseling and Alicia rest reviewing Alicia Neal blood glucose logs , discussing Alicia Neal hypoglycemia and hyperglycemia episodes, reviewing Alicia Neal current and  previous labs / studies  ( including abstraction from other facilities) and medications  doses and developing a  long term treatment plan and documenting Alicia Neal care.   Please refer to Neal Instructions for Blood Glucose Monitoring and Insulin/Medications Dosing Guide"  in media tab for additional information. Please  also refer to " Neal Self Inventory" in Alicia Media  tab for reviewed elements of pertinent Neal history.  Alicia Neal participated in Alicia discussions, expressed understanding, and voiced agreement with Alicia above plans.  All questions were answered to Alicia Neal satisfaction. Alicia Neal is encouraged to contact clinic should Alicia Neal have any questions or concerns prior to Alicia Neal return visit.  Follow up plan: - Return in about 6 months (around 08/05/2021) for Diabetes follow up with A1c in office, No previsit labs, Bring glucometer and logs.  Rayetta Pigg, [facility] [address] [place], [place] [address] Phone: [phone] Fax: [phone]  02/02/2021, 8:46 AM

## 2021-02-06 ENCOUNTER — Other Ambulatory Visit (HOSPITAL_COMMUNITY): Payer: Self-pay

## 2021-02-06 MED FILL — Evolocumab Subcutaneous Soln Auto-Injector 140 MG/ML: SUBCUTANEOUS | 28 days supply | Qty: 2 | Fill #0 | Status: AC

## 2021-02-08 ENCOUNTER — Other Ambulatory Visit (HOSPITAL_COMMUNITY): Payer: Self-pay

## 2021-02-09 ENCOUNTER — Ambulatory Visit: Payer: 59 | Admitting: Family Medicine

## 2021-02-09 ENCOUNTER — Other Ambulatory Visit (HOSPITAL_COMMUNITY): Payer: Self-pay

## 2021-02-09 MED FILL — Citalopram Hydrobromide Tab 20 MG (Base Equiv): ORAL | 90 days supply | Qty: 90 | Fill #0 | Status: AC

## 2021-02-10 ENCOUNTER — Other Ambulatory Visit (HOSPITAL_COMMUNITY): Payer: Self-pay

## 2021-02-15 ENCOUNTER — Telehealth: Payer: 59 | Admitting: Family Medicine

## 2021-02-15 ENCOUNTER — Encounter: Payer: Self-pay | Admitting: Family Medicine

## 2021-02-15 VITALS — BP 128/72 | Ht 61.0 in | Wt 185.0 lb

## 2021-02-15 DIAGNOSIS — G8929 Other chronic pain: Secondary | ICD-10-CM | POA: Diagnosis not present

## 2021-02-15 DIAGNOSIS — I10 Essential (primary) hypertension: Secondary | ICD-10-CM | POA: Diagnosis not present

## 2021-02-15 DIAGNOSIS — M25511 Pain in right shoulder: Secondary | ICD-10-CM | POA: Diagnosis not present

## 2021-02-15 DIAGNOSIS — E782 Mixed hyperlipidemia: Secondary | ICD-10-CM

## 2021-02-15 DIAGNOSIS — E1159 Type 2 diabetes mellitus with other circulatory complications: Secondary | ICD-10-CM | POA: Diagnosis not present

## 2021-02-15 DIAGNOSIS — E669 Obesity, unspecified: Secondary | ICD-10-CM

## 2021-02-15 DIAGNOSIS — E785 Hyperlipidemia, unspecified: Secondary | ICD-10-CM | POA: Diagnosis not present

## 2021-02-15 NOTE — Assessment & Plan Note (Signed)
Hyperlipidemia:Low fat diet discussed and encouraged.   Lipid Panel  Lab Results  Component Value Date   CHOL 165 01/25/2021   HDL 65 01/25/2021   LDLCALC 76 01/25/2021   TRIG 121 01/25/2021   CHOLHDL 2.5 01/25/2021

## 2021-02-15 NOTE — Assessment & Plan Note (Addendum)
1 year h/o right shoulder pain sometimes disturbs sleep refer Ortho

## 2021-02-15 NOTE — Assessment & Plan Note (Signed)
  Patient re-educated about  the importance of commitment to a  minimum of 150 minutes of exercise per week as able.  The importance of healthy food choices with portion control discussed, as well as eating regularly and within a 12 hour window most days. The need to choose "clean , green" food 50 to 75% of the time is discussed, as well as to make water the primary drink and set a goal of 64 ounces water daily.    Weight /BMI 02/15/2021 02/02/2021 09/23/2020  WEIGHT 185 lb 185 lb 182 lb  HEIGHT 5\' 1"  5' 1.5" 5' 1.5"  BMI 34.96 kg/m2 34.39 kg/m2 33.83 kg/m2

## 2021-02-15 NOTE — Assessment & Plan Note (Signed)
Deteriorated Updated lab needed at/ before next visit. Alicia Neal is reminded of the importance of commitment to daily physical activity for 30 minutes or more, as able and the need to limit carbohydrate intake to 30 to 60 grams per meal to help with blood sugar control.   The need to take medication as prescribed, test blood sugar as directed, and to call between visits if there is a concern that blood sugar is uncontrolled is also discussed.   Alicia Neal is reminded of the importance of daily foot exam, annual eye examination, and good blood sugar, blood pressure and cholesterol control.  Diabetic Labs Latest Ref Rng & Units 01/25/2021 08/05/2020 08/03/2020 03/31/2020 01/01/2020  HbA1c 4.8 - 5.6 % 7.5(H) 6.0(A) - 6.0(A) -  Microalbumin Not Estab. ug/mL <3.0(H) - <3.0(H) - -  Micro/Creat Ratio 0 - 29 mg/g creat <5 - - - -  Chol 0 - 200 mg/dL 165 - 195 - 167  HDL >40 mg/dL 65 - 70 - 66  Calc LDL 0 - 99 mg/dL 76 - 99 - 72  Triglycerides <150 mg/dL 121 - 131 - 145  Creatinine 0.44 - 1.00 mg/dL 0.65 - 0.59 - 0.61   BP/Weight 02/15/2021 02/02/2021 09/23/2020 08/05/2020 07/06/2020 04/29/2020 9/39/0300  Systolic BP 923 300 762 263 335 456 256  Diastolic BP 72 80 78 73 83 72 69  Wt. (Lbs) 185 185 182 177.8 176 179 185.9  BMI 34.96 34.39 33.83 32.52 32.19 32.74 34   Foot/eye exam completion dates Latest Ref Rng & Units 03/12/2020 07/29/2019  Eye Exam No Retinopathy No Retinopathy -  Foot Form Completion - - Done

## 2021-02-15 NOTE — Patient Instructions (Signed)
Annual physical  exam in office with MD , no pap , in October, call if you need me before  Non fasting hBA1C, chem 7 and eGFR June 22 or shortly after  It is important that you exercise regularly at least 30 minutes 5 times a week. If you develop chest pain, have severe difficulty breathing, or feel very tired, stop exercising immediately and seek medical attention  Think about what you will eat, plan ahead. Choose " clean, green, fresh or frozen" over canned, processed or packaged foods which are more sugary, salty and fatty. 70 to 75% of food eaten should be vegetables and fruit. Three meals at set times with snacks allowed between meals, but they must be fruit or vegetables. Aim to eat over a 12 hour period , example 7 am to 7 pm, and STOP after  your last meal of the day. Drink water,generally about 64 ounces per day, no other drink is as healthy. Fruit juice is best enjoyed in a healthy way, by EATING the fruit. You are referred to Dr Aline Brochure re right shoulder  Thanks for choosing [facility], we consider it a privelige to serve you.

## 2021-02-15 NOTE — Assessment & Plan Note (Signed)
Hyperlipidemia:Low fat diet discussed and encouraged.   Lipid Panel  Lab Results  Component Value Date   CHOL 165 01/25/2021   HDL 65 01/25/2021   LDLCALC 76 01/25/2021   TRIG 121 01/25/2021   CHOLHDL 2.5 01/25/2021   Controlled, no change in medication]

## 2021-02-15 NOTE — Assessment & Plan Note (Signed)
Controlled, no change in medication DASH diet and commitment to daily physical activity for a minimum of 30 minutes discussed and encouraged, as a part of hypertension management. The importance of attaining a healthy weight is also discussed.  BP/Weight 02/15/2021 02/02/2021 09/23/2020 08/05/2020 07/06/2020 04/29/2020 1/61/0960  Systolic BP 454 098 119 147 829 562 130  Diastolic BP 72 80 78 73 83 72 69  Wt. (Lbs) 185 185 182 177.8 176 179 185.9  BMI 34.96 34.39 33.83 32.52 32.19 32.74 34

## 2021-02-15 NOTE — Progress Notes (Signed)
Virtual Visit via Telephone Note  I connected with Alicia Neal on 02/15/21 at  8:00 AM EDT by telephone and verified that I am speaking with the correct person using two identifiers.  Location: Patient: work Provider: office  I discussed the limitations, risks, security and privacy concerns of performing an evaluation and management service by telephone and the availability of in person appointments. I also discussed with the patient that there may be a patient responsible charge related to this service. The patient expressed understanding and agreed to proceed.   History of Present Illness: F/U chronic problems and address any new or current concerns. Review and update medications and allergies. Review recent lab and radiologic data . Update routine health maintainace. Review an encourage improved health habits to include nutrition, exercise and  sleep . Has not been exercising regularly due to stress, recently started again  Denies recent fever or chills. Denies sinus pressure, nasal congestion, ear pain or sore throat. Denies chest congestion, productive cough or wheezing. Denies chest pains, palpitations and leg swelling Denies abdominal pain, nausea, vomiting,diarrhea or constipation.   Denies dysuria, frequency, hesitancy or incontinence. C/o right shoulder pain and limitation in mobility. Denies headaches, seizures, numbness, or tingling. Recent increas ein stress leading to deterioration in  lood sugar control Denies skin break down or rash.       Observations/Objective: BP 128/72   Ht 5\' 1"  (1.549 m)   Wt 185 lb (83.9 kg)   BMI 34.96 kg/m  Good communication with no confusion and intact memory. Alert and oriented x 3 No signs of respiratory distress during speech    Assessment and Plan: Shoulder pain, right 1 year h/o right shoulder pain sometimes disturbs sleep refer Ortho  HTN, goal below 130/80 Controlled, no change in medication DASH diet and  commitment to daily physical activity for a minimum of 30 minutes discussed and encouraged, as a part of hypertension management. The importance of attaining a healthy weight is also discussed.  BP/Weight 02/15/2021 02/02/2021 09/23/2020 08/05/2020 07/06/2020 04/29/2020 6/[birth year]/1950  Systolic BP 932 671 245 809 983 382 505  Diastolic BP 72 80 78 73 83 72 69  Wt. (Lbs) 185 185 182 177.8 176 179 185.9  BMI 34.96 34.39 33.83 32.52 32.19 32.74 34       Hyperlipidemia LDL goal <100 Hyperlipidemia:Low fat diet discussed and encouraged.   Lipid Panel  Lab Results  Component Value Date   CHOL 165 01/25/2021   HDL 65 01/25/2021   LDLCALC 76 01/25/2021   TRIG 121 01/25/2021   CHOLHDL 2.5 01/25/2021   Controlled, no change in medication]    Mildly obese  Patient re-educated about  the importance of commitment to a  minimum of 150 minutes of exercise per week as able.  The importance of healthy food choices with portion control discussed, as well as eating regularly and within a 12 hour window most days. The need to choose "clean , green" food 50 to 75% of the time is discussed, as well as to make water the primary drink and set a goal of 64 ounces water daily.    Weight /BMI 02/15/2021 02/02/2021 09/23/2020  WEIGHT 185 lb 185 lb 182 lb  HEIGHT 5\' 1"  5' 1.5" 5' 1.5"  BMI 34.96 kg/m2 34.39 kg/m2 33.83 kg/m2      Mixed hyperlipidemia Hyperlipidemia:Low fat diet discussed and encouraged.   Lipid Panel  Lab Results  Component Value Date   CHOL 165 01/25/2021   HDL 65 01/25/2021  [place] 76 01/25/2021   TRIG 121 01/25/2021   CHOLHDL 2.5 01/25/2021       Type 2 diabetes mellitus with vascular disease ([place]) Deteriorated Updated lab needed at/ before next visit. Ms. Lukes is reminded of the importance of commitment to daily physical activity for 30 minutes or more, as able and the need to limit carbohydrate intake to 30 to 60 grams per meal to help with blood sugar control.    The need to take medication as prescribed, test blood sugar as directed, and to call between visits if there is a concern that blood sugar is uncontrolled is also discussed.   Ms. Brisby is reminded of the importance of daily foot exam, annual eye examination, and good blood sugar, blood pressure and cholesterol control.  Diabetic Labs Latest Ref Rng & Units 01/25/2021 08/05/2020 08/03/2020 03/31/2020 01/01/2020  HbA1c 4.8 - 5.6 % 7.5(H) 6.0(A) - 6.0(A) -  Microalbumin Not Estab. ug/mL <3.0(H) - <3.0(H) - -  Micro/Creat Ratio 0 - 29 mg/g creat <5 - - - -  Chol 0 - 200 mg/dL 165 - 195 - 167  HDL >40 mg/dL 65 - 70 - 66  Calc LDL 0 - 99 mg/dL 76 - 99 - 72  Triglycerides <150 mg/dL 121 - 131 - 145  Creatinine 0.44 - 1.00 mg/dL 0.65 - 0.59 - 0.61   BP/Weight 02/15/2021 02/02/2021 09/23/2020 08/05/2020 07/06/2020 04/29/2020 3/79/0240  Systolic BP 973 532 992 426 834 196 222  Diastolic BP 72 80 78 73 83 72 69  Wt. (Lbs) 185 185 182 177.8 176 179 185.9  BMI 34.96 34.39 33.83 32.52 32.19 32.74 34   Foot/eye exam completion dates Latest Ref Rng & Units 03/12/2020 07/29/2019  Eye Exam No Retinopathy No Retinopathy -  Foot Form Completion - - Done          Follow Up Instructions:    I discussed the assessment and treatment plan with the patient. The patient was provided an opportunity to ask questions and all were answered. The patient agreed with the plan and demonstrated an understanding of the instructions.   The patient was advised to call back or seek an in-person evaluation if the symptoms worsen or if the condition fails to improve as anticipated.  I provided 15 minutes of non-face-to-face time during this encounter.   Tula Nakayama, MD

## 2021-02-18 ENCOUNTER — Other Ambulatory Visit (HOSPITAL_COMMUNITY): Payer: Self-pay

## 2021-02-18 MED FILL — Losartan Potassium Tab 100 MG: ORAL | 90 days supply | Qty: 90 | Fill #0 | Status: AC

## 2021-02-19 ENCOUNTER — Other Ambulatory Visit (HOSPITAL_COMMUNITY): Payer: Self-pay

## 2021-03-14 MED FILL — Evolocumab Subcutaneous Soln Auto-Injector 140 MG/ML: SUBCUTANEOUS | 28 days supply | Qty: 2 | Fill #1 | Status: AC

## 2021-03-15 ENCOUNTER — Other Ambulatory Visit (HOSPITAL_COMMUNITY): Payer: Self-pay

## 2021-03-15 DIAGNOSIS — H5213 Myopia, bilateral: Secondary | ICD-10-CM | POA: Diagnosis not present

## 2021-03-15 LAB — HM DIABETES EYE EXAM

## 2021-03-24 ENCOUNTER — Other Ambulatory Visit (HOSPITAL_COMMUNITY): Payer: Self-pay

## 2021-03-24 MED FILL — Amlodipine Besylate Tab 10 MG (Base Equivalent): ORAL | 90 days supply | Qty: 90 | Fill #0 | Status: AC

## 2021-03-31 DIAGNOSIS — D485 Neoplasm of uncertain behavior of skin: Secondary | ICD-10-CM | POA: Diagnosis not present

## 2021-03-31 DIAGNOSIS — Z1283 Encounter for screening for malignant neoplasm of skin: Secondary | ICD-10-CM | POA: Diagnosis not present

## 2021-03-31 DIAGNOSIS — D225 Melanocytic nevi of trunk: Secondary | ICD-10-CM | POA: Diagnosis not present

## 2021-04-06 ENCOUNTER — Other Ambulatory Visit: Payer: Self-pay | Admitting: Nurse Practitioner

## 2021-04-06 ENCOUNTER — Other Ambulatory Visit (HOSPITAL_COMMUNITY): Payer: Self-pay

## 2021-04-06 MED ORDER — METFORMIN HCL 1000 MG PO TABS
ORAL_TABLET | Freq: Two times a day (BID) | ORAL | 1 refills | Status: DC
  Filled 2021-04-06: qty 180, 90d supply, fill #0
  Filled 2021-07-05: qty 180, 90d supply, fill #1

## 2021-04-07 ENCOUNTER — Other Ambulatory Visit (HOSPITAL_COMMUNITY): Payer: Self-pay

## 2021-04-07 MED FILL — Evolocumab Subcutaneous Soln Auto-Injector 140 MG/ML: SUBCUTANEOUS | 28 days supply | Qty: 2 | Fill #2 | Status: AC

## 2021-04-07 MED FILL — Dulaglutide Soln Auto-injector 1.5 MG/0.5ML: SUBCUTANEOUS | 84 days supply | Qty: 6 | Fill #0 | Status: AC

## 2021-04-22 ENCOUNTER — Other Ambulatory Visit: Payer: Self-pay

## 2021-04-27 ENCOUNTER — Other Ambulatory Visit (HOSPITAL_COMMUNITY): Payer: Self-pay

## 2021-04-27 ENCOUNTER — Other Ambulatory Visit: Payer: Self-pay

## 2021-04-29 ENCOUNTER — Other Ambulatory Visit (HOSPITAL_COMMUNITY): Payer: Self-pay

## 2021-04-29 MED FILL — Pantoprazole Sodium EC Tab 40 MG (Base Equiv): ORAL | 90 days supply | Qty: 90 | Fill #0 | Status: AC

## 2021-05-04 ENCOUNTER — Other Ambulatory Visit: Payer: Self-pay

## 2021-05-04 ENCOUNTER — Other Ambulatory Visit (HOSPITAL_COMMUNITY): Payer: Self-pay

## 2021-05-05 ENCOUNTER — Other Ambulatory Visit (HOSPITAL_COMMUNITY): Payer: Self-pay

## 2021-05-06 ENCOUNTER — Other Ambulatory Visit (HOSPITAL_COMMUNITY): Payer: Self-pay

## 2021-05-11 ENCOUNTER — Other Ambulatory Visit (HOSPITAL_COMMUNITY): Payer: Self-pay

## 2021-05-16 MED FILL — Losartan Potassium Tab 100 MG: ORAL | 90 days supply | Qty: 90 | Fill #1 | Status: AC

## 2021-05-16 MED FILL — Citalopram Hydrobromide Tab 20 MG (Base Equiv): ORAL | 90 days supply | Qty: 90 | Fill #1 | Status: AC

## 2021-05-17 ENCOUNTER — Other Ambulatory Visit (HOSPITAL_COMMUNITY): Payer: Self-pay

## 2021-06-06 ENCOUNTER — Other Ambulatory Visit: Payer: Self-pay | Admitting: Nurse Practitioner

## 2021-06-08 ENCOUNTER — Other Ambulatory Visit (HOSPITAL_COMMUNITY): Payer: Self-pay

## 2021-06-08 MED ORDER — REPATHA SURECLICK 140 MG/ML ~~LOC~~ SOAJ
SUBCUTANEOUS | 2 refills | Status: DC
  Filled 2021-06-08: qty 2, 28d supply, fill #0
  Filled 2021-07-09: qty 2, 28d supply, fill #1
  Filled 2021-08-21: qty 2, 28d supply, fill #2

## 2021-06-20 ENCOUNTER — Other Ambulatory Visit: Payer: Self-pay | Admitting: Nurse Practitioner

## 2021-06-20 DIAGNOSIS — I10 Essential (primary) hypertension: Secondary | ICD-10-CM

## 2021-06-21 ENCOUNTER — Other Ambulatory Visit (HOSPITAL_COMMUNITY): Payer: Self-pay

## 2021-06-21 MED ORDER — AMLODIPINE BESYLATE 10 MG PO TABS
ORAL_TABLET | Freq: Every day | ORAL | 0 refills | Status: DC
  Filled 2021-06-21: qty 90, 90d supply, fill #0

## 2021-06-29 ENCOUNTER — Other Ambulatory Visit: Payer: 59 | Admitting: Women's Health

## 2021-07-05 ENCOUNTER — Other Ambulatory Visit (HOSPITAL_COMMUNITY): Payer: Self-pay

## 2021-07-07 DIAGNOSIS — D485 Neoplasm of uncertain behavior of skin: Secondary | ICD-10-CM | POA: Diagnosis not present

## 2021-07-07 DIAGNOSIS — Z1283 Encounter for screening for malignant neoplasm of skin: Secondary | ICD-10-CM | POA: Diagnosis not present

## 2021-07-07 DIAGNOSIS — D225 Melanocytic nevi of trunk: Secondary | ICD-10-CM | POA: Diagnosis not present

## 2021-07-08 ENCOUNTER — Encounter: Payer: Self-pay | Admitting: Family Medicine

## 2021-07-09 ENCOUNTER — Other Ambulatory Visit (HOSPITAL_COMMUNITY): Payer: Self-pay

## 2021-07-13 ENCOUNTER — Encounter: Payer: Self-pay | Admitting: Women's Health

## 2021-07-13 ENCOUNTER — Other Ambulatory Visit (HOSPITAL_COMMUNITY): Payer: Self-pay

## 2021-07-13 ENCOUNTER — Other Ambulatory Visit: Payer: Self-pay

## 2021-07-13 ENCOUNTER — Ambulatory Visit (INDEPENDENT_AMBULATORY_CARE_PROVIDER_SITE_OTHER): Payer: 59 | Admitting: Women's Health

## 2021-07-13 VITALS — BP 150/87 | HR 72 | Ht 62.25 in | Wt 194.0 lb

## 2021-07-13 DIAGNOSIS — R32 Unspecified urinary incontinence: Secondary | ICD-10-CM | POA: Diagnosis not present

## 2021-07-13 DIAGNOSIS — Z1231 Encounter for screening mammogram for malignant neoplasm of breast: Secondary | ICD-10-CM | POA: Diagnosis not present

## 2021-07-13 DIAGNOSIS — Z01419 Encounter for gynecological examination (general) (routine) without abnormal findings: Secondary | ICD-10-CM | POA: Diagnosis not present

## 2021-07-13 NOTE — Patient Instructions (Addendum)
Schedule mammogram after 12/16/21 Urinary Incontinence Urinary incontinence refers to a condition in which a person is unable to control where and when to pass urine. A person with this condition will urinate when he or she does not mean to (involuntarily). What are the causes? This condition may be caused by: Medicines. Infections. Constipation. Overactive bladder muscles. Weak bladder muscles. Weak pelvic floor muscles. These muscles provide support for the bladder, intestine, and, in women, the uterus. Enlarged prostate in men. The prostate is a gland near the bladder. When it gets too big, it can pinch the urethra. With the urethra blocked, the bladder can weaken and lose the ability to empty properly. Surgery. Emotional factors, such as anxiety, stress, or post-traumatic stress disorder (PTSD). Pelvic organ prolapse. This happens in women when organs shift out of place and into the vagina. This shift can prevent the bladder and urethra from working properly. What increases the risk? The following factors may make you more likely to develop this condition: Older age. Obesity and physical inactivity. Pregnancy and childbirth. Menopause. Diseases that affect the nerves or spinal cord (neurological diseases). Long-term (chronic) coughing. This can increase pressure on the bladder and pelvic floor muscles. What are the signs or symptoms? Symptoms may vary depending on the type of urinary incontinence you have. They include: A sudden urge to urinate, but passing urine involuntarily before you can get to a bathroom (urge incontinence). Suddenly passing urine with any activity that forces urine to pass, such as coughing, laughing, exercise, or sneezing (stress incontinence). Needing to urinate often, but urinating only a small amount, or constantly dribbling urine (overflow incontinence). Urinating because you cannot get to the bathroom in time due to a physical disability, such as arthritis  or injury, or communication and thinking problems, such as Alzheimer disease (functional incontinence). How is this diagnosed? This condition may be diagnosed based on: Your medical history. A physical exam. Tests, such as: Urine tests. X-rays of your kidney and bladder. Ultrasound. CT scan. Cystoscopy. In this procedure, a health care provider inserts a tube with a light and camera (cystoscope) through the urethra and into the bladder in order to check for problems. Urodynamic testing. These tests assess how well the bladder, urethra, and sphincter can store and release urine. There are different types of urodynamic tests, and they vary depending on what the test is measuring. To help diagnose your condition, your health care provider may recommend that you keep a log of when you urinate and how much you urinate. How is this treated? Treatment for this condition depends on the type of incontinence that you have and its cause. Treatment may include: Lifestyle changes, such as: Quitting smoking. Maintaining a healthy weight. Staying active. Try to get 150 minutes of moderate-intensity exercise every week. Ask your health care provider which activities are safe for you. Eating a healthy diet. Avoid high-fat foods, like fried foods. Avoid refined carbohydrates like white bread and white rice. Limit how much alcohol and caffeine you drink. Increase your fiber intake. Foods such as fresh fruits, vegetables, beans, and whole grains are healthy sources of fiber. Pelvic floor muscle exercises. Bladder training, such as lengthening the amount of time between bathroom breaks, or using the bathroom at regular intervals. Using techniques to suppress bladder urges. This can include distraction techniques or controlled breathing exercises. Medicines to relax the bladder muscles and prevent bladder spasms. Medicines to help slow or prevent the growth of a man's prostate. Botox injections. These can  help relax  the bladder muscles. Using pulses of electricity to help change bladder reflexes (electrical nerve stimulation). For women, using a medical device to prevent urine leaks. This is a small, tampon-like, disposable device that is inserted into the urethra. Injecting collagen or carbon beads (bulking agents) into the urinary sphincter. These can help thicken tissue and close the bladder opening. Surgery. Follow these instructions at home: Lifestyle Limit alcohol and caffeine. These can fill your bladder quickly and irritate it. Keep yourself clean to help prevent odors and skin damage. Ask your doctor about special skin creams and cleansers that can protect the skin from urine. Consider wearing pads or adult diapers. Make sure to change them regularly, and always change them right after experiencing incontinence. General instructions Take over-the-counter and prescription medicines only as told by your health care provider. Use the bathroom about every 3-4 hours, even if you do not feel the need to urinate. Try to empty your bladder completely every time. After urinating, wait a minute. Then try to urinate again. Make sure you are in a relaxed position while urinating. If your incontinence is caused by nerve problems, keep a log of the medicines you take and the times you go to the bathroom. Keep all follow-up visits as told by your health care provider. This is important. Contact a health care provider if: You have pain that gets worse. Your incontinence gets worse. Get help right away if: You have a fever or chills. You are unable to urinate. You have redness in your groin area or down your legs. Summary Urinary incontinence refers to a condition in which a person is unable to control where and when to pass urine. This condition may be caused by medicines, infection, weak bladder muscles, weak pelvic floor muscles, enlargement of the prostate (in men), or surgery. The following  factors increase your risk for developing this condition: older age, obesity, pregnancy and childbirth, menopause, neurological diseases, and chronic coughing. There are several types of urinary incontinence. They include urge incontinence, stress incontinence, overflow incontinence, and functional incontinence. This condition is usually treated first with lifestyle and behavioral changes, such as quitting smoking, eating a healthier diet, and doing regular pelvic floor exercises. Other treatment options include medicines, bulking agents, medical devices, electrical nerve stimulation, or surgery. This information is not intended to replace advice given to you by your health care provider. Make sure you discuss any questions you have with your health care provider. Document Revised: 11/03/2017 Document Reviewed: 02/02/2017 Elsevier Patient Education  [place].

## 2021-07-13 NOTE — Progress Notes (Signed)
WELL-WOMAN EXAMINATION Patient name: Alicia Neal MRN [place]:[id number]  Date of birth: Nov 09, 1960 Chief Complaint:   Gynecologic Exam (Has trouble holding urine)  History of Present Illness:   Alicia Neal is a 60 y.o. G41P2 Caucasian female being seen today for a routine well-woman exam.  Current complaints: leaking urine, some w/ coughing/sneezing, etc., but can just leak out randomly, not just dribble, but large amount. Doesn't necessarily happen when feels urge to void. Denies UTI sx.  PCP: Moshe Cipro      does not desire labs No LMP recorded. Patient is postmenopausal. The current method of family planning is post menopausal status.  Last pap 06/09/20. Results were: NILM w/ HRHPV negative. H/O abnormal pap: yes h/o abnomal pap and cryosurgery w/ multiple subsequent normal paps Last mammogram: 12/16/20. Results were: normal. Family h/o breast cancer: no Last colonoscopy: 12/23/19. Results were: tubular adenoma polyps removed, f/u 33yr.  [facility] h/o colorectal cancer: yes [facility] 07/13/2021 02/15/2021 09/23/2020 07/06/2020 04/29/2020  Decreased Interest 0 0 0 0 0  Down, Depressed, Hopeless 0 0 0 1 0  PHQ - 2 Score 0 0 0 1 0  Altered sleeping 1 - - 1 -  Tired, decreased energy 0 - - 0 -  Change in appetite 0 - - 0 -  Feeling bad or failure about yourself  2 - - - -  Trouble concentrating 0 - - 0 -  Moving slowly or fidgety/restless 0 - - 0 -  Suicidal thoughts 0 - - 0 -  PHQ-9 Score 3 - - 2 -  Difficult doing work/chores - - - - -  Some recent data might be hidden     GAD 7 : Generalized Anxiety Score 07/13/2021 07/06/2020 11/13/2019 07/29/2019  Nervous, Anxious, on Edge '1 2 1 3  '$ Control/stop worrying '1 2 1 3  '$ Worry too much - different things '1 2 1 3  '$ Trouble relaxing '1 2 1 3  '$ Restless 0 0 1 0  Easily annoyed or irritable 0 0 1 2  Afraid - awful might happen '2 2 1 3  '$ Total GAD 7 Score '6 10 7 17  '$ Anxiety Difficulty - - - Not difficult at all      Review of Systems:   Pertinent items are noted in HPI Denies any headaches, blurred vision, fatigue, shortness of breath, chest pain, abdominal pain, abnormal vaginal discharge/itching/odor/irritation, problems with periods, bowel movements, urination, or intercourse unless otherwise stated above. Pertinent History Reviewed:  Reviewed past medical,surgical, social and family history.  Reviewed problem list, medications and allergies. Physical Assessment:   Vitals:   07/13/21 0843  BP: (!) 150/87  Pulse: 72  Weight: 194 lb (88 kg)  Height: 5' 2.25" (1.581 m)  Body mass index is 35.2 kg/m.        Physical Examination:   General appearance - well appearing, and in no distress  Mental status - alert, oriented to person, place, and time  Psych:  She has a normal mood and affect  Skin - warm and dry, normal color, no suspicious lesions noted  Chest - effort normal, all lung fields clear to auscultation bilaterally  Heart - normal rate and regular rhythm  Neck:  midline trachea, no thyromegaly or nodules  Breasts - breasts appear normal, no suspicious masses, no skin or nipple changes or  axillary nodes  Abdomen - soft, nontender, nondistended, no masses or organomegaly  Pelvic - VULVA: normal appearing vulva with no  masses, tenderness or lesions  VAGINA: normal appearing vagina with normal color and discharge, no lesions  CERVIX: normal appearing cervix without discharge or lesions, no CMT  Thin prep pap is not done   UTERUS: uterus is felt to be normal size, shape, consistency and nontender   ADNEXA: No adnexal masses or tenderness noted.  Extremities:  No swelling or varicosities noted  Chaperone: Levy Pupa    No results found for this or any previous visit (from the past 24 hour(s)).  Assessment & Plan:  1) Well-Woman Exam  2) Urinary incontinence> offered vesicare +/- referral to uro/gyn. Declines both right now, will let me know if changes mind  Labs/procedures  today: exam  Mammogram:  after 12/16/21 , or sooner if problems Colonoscopy: per GI, or sooner if problems  Orders Placed This Encounter  Procedures   MS DIGITAL SCREENING TOMO BILATERAL    Meds: No orders of the defined types were placed in this encounter.   Follow-up: Return in about 1 year (around 07/13/2022) for Physical.  Roma Schanz CNM, WHNP-BC 07/13/2021 9:15 AM

## 2021-07-18 ENCOUNTER — Other Ambulatory Visit: Payer: Self-pay | Admitting: "Endocrinology

## 2021-07-18 DIAGNOSIS — E1165 Type 2 diabetes mellitus with hyperglycemia: Secondary | ICD-10-CM

## 2021-07-19 ENCOUNTER — Other Ambulatory Visit (HOSPITAL_COMMUNITY): Payer: Self-pay

## 2021-07-19 MED ORDER — TRULICITY 1.5 MG/0.5ML ~~LOC~~ SOAJ
SUBCUTANEOUS | 5 refills | Status: DC
  Filled 2021-07-19: qty 4, 56d supply, fill #0

## 2021-08-01 ENCOUNTER — Other Ambulatory Visit: Payer: Self-pay | Admitting: Family Medicine

## 2021-08-01 MED FILL — Citalopram Hydrobromide Tab 20 MG (Base Equiv): ORAL | 90 days supply | Qty: 90 | Fill #2 | Status: AC

## 2021-08-02 ENCOUNTER — Encounter: Payer: Self-pay | Admitting: Nurse Practitioner

## 2021-08-02 ENCOUNTER — Other Ambulatory Visit (HOSPITAL_COMMUNITY): Payer: Self-pay

## 2021-08-02 MED ORDER — PANTOPRAZOLE SODIUM 40 MG PO TBEC
DELAYED_RELEASE_TABLET | Freq: Every day | ORAL | 3 refills | Status: DC
  Filled 2021-08-02: qty 90, 90d supply, fill #0
  Filled 2021-10-30: qty 90, 90d supply, fill #1
  Filled 2022-02-04: qty 90, 90d supply, fill #2
  Filled 2022-05-04: qty 90, 90d supply, fill #3

## 2021-08-02 MED ORDER — LOSARTAN POTASSIUM 100 MG PO TABS
ORAL_TABLET | Freq: Every day | ORAL | 3 refills | Status: DC
  Filled 2021-08-02 – 2021-08-21 (×2): qty 90, 90d supply, fill #0
  Filled 2021-11-27: qty 90, 90d supply, fill #1
  Filled 2022-02-23: qty 90, 90d supply, fill #2
  Filled 2022-05-25: qty 90, 90d supply, fill #3

## 2021-08-03 ENCOUNTER — Other Ambulatory Visit: Payer: Self-pay | Admitting: "Endocrinology

## 2021-08-03 DIAGNOSIS — E1165 Type 2 diabetes mellitus with hyperglycemia: Secondary | ICD-10-CM

## 2021-08-04 ENCOUNTER — Ambulatory Visit (HOSPITAL_COMMUNITY)
Admission: RE | Admit: 2021-08-04 | Discharge: 2021-08-04 | Disposition: A | Discharge status: Home / Self Care | Payer: 59 | Source: Ambulatory Visit | Attending: Orthopedic Surgery | Admitting: Orthopedic Surgery

## 2021-08-04 ENCOUNTER — Ambulatory Visit: Payer: 59

## 2021-08-04 ENCOUNTER — Other Ambulatory Visit: Payer: Self-pay

## 2021-08-04 ENCOUNTER — Other Ambulatory Visit: Payer: Self-pay | Admitting: Orthopedic Surgery

## 2021-08-04 ENCOUNTER — Telehealth: Payer: 59 | Admitting: Family Medicine

## 2021-08-04 ENCOUNTER — Other Ambulatory Visit (HOSPITAL_COMMUNITY)
Admission: RE | Admit: 2021-08-04 | Discharge: 2021-08-04 | Disposition: A | Discharge status: Home / Self Care | Payer: 59 | Source: Ambulatory Visit | Attending: "Endocrinology | Admitting: "Endocrinology

## 2021-08-04 DIAGNOSIS — M25561 Pain in right knee: Secondary | ICD-10-CM

## 2021-08-04 DIAGNOSIS — E1165 Type 2 diabetes mellitus with hyperglycemia: Secondary | ICD-10-CM | POA: Insufficient documentation

## 2021-08-04 DIAGNOSIS — M25569 Pain in unspecified knee: Secondary | ICD-10-CM

## 2021-08-04 LAB — COMPREHENSIVE METABOLIC PANEL
ALT: 40 U/L (ref 0–44)
AST: 31 U/L (ref 15–41)
Albumin: 4 g/dL (ref 3.5–5.0)
Alkaline Phosphatase: 77 U/L (ref 38–126)
Anion gap: 10 (ref 5–15)
BUN: 15 mg/dL (ref 8–23)
CO2: 25 mmol/L (ref 22–32)
Calcium: 8.8 mg/dL — ABNORMAL LOW (ref 8.9–10.3)
Chloride: 96 mmol/L — ABNORMAL LOW (ref 98–111)
Creatinine, Ser: 0.58 mg/dL (ref 0.44–1.00)
GFR, Estimated: >60 mL/min (ref >60)
Glucose, Bld: 223 mg/dL — ABNORMAL HIGH (ref 70–99)
Potassium: 4.3 mmol/L (ref 3.5–5.1)
Sodium: 131 mmol/L — ABNORMAL LOW (ref 135–145)
Total Bilirubin: 0.4 mg/dL (ref 0.3–1.2)
Total Protein: 7.4 g/dL (ref 6.5–8.1)

## 2021-08-04 LAB — T4, FREE: Free T4: 0.97 ng/dL (ref 0.61–1.12)

## 2021-08-04 LAB — LIPID PANEL
Cholesterol: 129 mg/dL (ref 0–200)
HDL: 54 mg/dL (ref >40)
LDL Cholesterol: 45 mg/dL (ref 0–99)
Total CHOL/HDL Ratio: 2.4 RATIO
Triglycerides: 152 mg/dL — ABNORMAL HIGH (ref <150)
VLDL: 30 mg/dL (ref 0–40)

## 2021-08-04 LAB — TSH: TSH: 2.286 u[IU]/mL (ref 0.350–4.500)

## 2021-08-04 NOTE — Progress Notes (Signed)
Patient called and said she has fallen and injured her right knee, twist injury, DOI 08/01/21.  She is asking for an xray of the right knee to be ordered.  Per Dr Aline Brochure the right knee xray order is entered.  Patient will have this done at [facility] and follow up after the knee xray with Dr Aline Brochure.

## 2021-08-04 NOTE — Progress Notes (Signed)
[place] needs in person eval secondary to fall

## 2021-08-05 ENCOUNTER — Other Ambulatory Visit (HOSPITAL_COMMUNITY): Payer: Self-pay

## 2021-08-05 ENCOUNTER — Encounter: Payer: Self-pay | Admitting: Nurse Practitioner

## 2021-08-05 ENCOUNTER — Ambulatory Visit: Payer: 59 | Admitting: Nurse Practitioner

## 2021-08-05 ENCOUNTER — Encounter: Payer: Self-pay | Admitting: Orthopedic Surgery

## 2021-08-05 ENCOUNTER — Ambulatory Visit: Payer: 59 | Admitting: Orthopedic Surgery

## 2021-08-05 VITALS — BP 140/91 | HR 80 | Ht 62.5 in | Wt 190.0 lb

## 2021-08-05 VITALS — BP 134/76 | HR 69 | Ht 62.25 in | Wt 192.0 lb

## 2021-08-05 DIAGNOSIS — Z789 Other specified health status: Secondary | ICD-10-CM | POA: Diagnosis not present

## 2021-08-05 DIAGNOSIS — M25561 Pain in right knee: Secondary | ICD-10-CM | POA: Diagnosis not present

## 2021-08-05 DIAGNOSIS — E782 Mixed hyperlipidemia: Secondary | ICD-10-CM

## 2021-08-05 DIAGNOSIS — E1165 Type 2 diabetes mellitus with hyperglycemia: Secondary | ICD-10-CM | POA: Diagnosis not present

## 2021-08-05 DIAGNOSIS — I10 Essential (primary) hypertension: Secondary | ICD-10-CM | POA: Diagnosis not present

## 2021-08-05 DIAGNOSIS — E559 Vitamin D deficiency, unspecified: Secondary | ICD-10-CM | POA: Diagnosis not present

## 2021-08-05 LAB — POCT GLYCOSYLATED HEMOGLOBIN (HGB A1C): HbA1c, POC (controlled diabetic range): 9.4 % — AB (ref 0.0–7.0)

## 2021-08-05 MED ORDER — TIRZEPATIDE 5 MG/0.5ML ~~LOC~~ SOAJ
5.0000 mg | SUBCUTANEOUS | 3 refills | Status: DC
  Filled 2021-08-05: qty 6, 84d supply, fill #0
  Filled 2021-10-30: qty 6, 84d supply, fill #1
  Filled 2022-01-23: qty 6, 84d supply, fill #2
  Filled 2022-04-17: qty 6, 84d supply, fill #3

## 2021-08-05 NOTE — Progress Notes (Signed)
08/05/2021, 9:03 AM                                             Endocrinology follow-up note   Subjective:    Patient ID: Alicia Neal, female    DOB: 08/19/1961.  Alicia Neal is being seen in follow-up for management of currently uncontrolled symptomatic diabetes, hyperlipidemia. PMD:   Fayrene Helper, MD.   Past Medical History:  Diagnosis Date   Adrenal adenoma    right side   Allergic rhinitis    Allergy    cat,dog,animal dander   Anxiety disorder    Carpal tunnel syndrome of left wrist    had surgery to repair   Chronic depression    Dr. Cheryln Manly   GERD (gastroesophageal reflux disease)    History of abnormal cervical Pap smear    History of adenomatous polyp of colon    History of hiatal hernia    Hyperlipidemia    Hypertension    IBS (irritable bowel syndrome)    OSA (obstructive sleep apnea)    per pt study yrs ago-- CPAP intolerant    Pemphigoid, cicatricial    pruritic autoimmune blistering skin disorder   PONV (postoperative nausea and vomiting)    Pulmonary nodule, right pulmologist-  dr Nathaneil Canary mcquaid   x2    per CT 09-11-2015   Trigger finger, left    ring finger   Type 2 diabetes mellitus ([place])    Uncontrolled type 2 diabetes mellitus with hyperglycemia ([place]) 04/17/2018   Wears glasses    Past Surgical History:  Procedure Laterality Date   CARDIOVASCULAR STRESS TEST  04-30-2003   Small focus of decreased perfusion on rest images in the mid-distal anterior wall (worrisome for ischemia),  normal LV function and wall motion , ef 60%   CARPAL TUNNEL RELEASE Left 11/26/2015   Procedure: CARPAL TUNNEL RELEASE;  Surgeon: Iran Planas, MD;  Location: [facility];  Service: Orthopedics;  Laterality: Left;   CHOLECYSTECTOMY  1989   COLONOSCOPY  11/04/2009   POLYPECTOMY     TRANSTHORACIC ECHOCARDIOGRAM  04-30-2003   mild hypokinetic in the inferior and posterior wall, ef 50%/  trivial MR /  mild TR   TRIGGER FINGER  RELEASE Left 11/26/2015   Procedure: RELEASE TRIGGER FINGER/A-1 PULLEY LEFT RING  FINGER;  Surgeon: Iran Planas, MD;  Location: [facility];  Service: Orthopedics;  Laterality: Left;   TUBAL LIGATION  2000   Social History   Socioeconomic History   Marital status: Single    Spouse name: Not on file   Number of children: 1   Years of education: Not on file   Highest education level: Not on file  Occupational History   Not on file  Tobacco Use   Smoking status: Former    Packs/day: 1.00    Years: 40.00    Pack years: 40.00    Types: Cigarettes    Quit date: 11/07/2013    Years since quitting: 7.7   Smokeless tobacco: Never  Vaping Use   Vaping Use: Never used  Substance and Sexual Activity   Alcohol use: Yes    Alcohol/week: 0.0 standard drinks    Comment: occasional   Drug use: No   Sexual activity: Not Currently    Birth control/protection: Post-menopausal, Surgical  Comment: tubal  Other Topics Concern   Not on file  Social History Narrative   Not on file   Social Determinants of Health   Financial Resource Strain: Low Risk    Difficulty of Paying Living Expenses: Not hard at all  Food Insecurity: No Food Insecurity   Worried About Charity fundraiser in the Last Year: Never true   [place] in the Last Year: Never true  Transportation Needs: No Transportation Needs   Lack of Transportation (Medical): No   Lack of Transportation (Non-Medical): No  Physical Activity: Sufficiently Active   Days of Exercise per Week: 4 days   Minutes of Exercise per Session: 40 min  Stress: Stress Concern Present   Feeling of Stress : To some extent  Social Connections: Socially Isolated   Frequency of Communication with Friends and Family: Three times a week   Frequency of Social Gatherings with Friends and Family: Twice a week   Attends Religious Services: Never   Marine scientist or Organizations: No   Attends Archivist Meetings: Never   Marital Status:  Divorced   Outpatient Encounter Medications as of 08/05/2021  Medication Sig   amLODipine (NORVASC) 10 MG tablet TAKE 1 TABLET BY MOUTH DAILY   blood glucose meter kit and supplies Dispense Freestyle meter and supplies for once daily testing dx e11.9   Blood Glucose Monitoring Suppl (FREESTYLE LITE) w/Device KIT USE AS DIRECTED   Cholecalciferol (VITAMIN D) 125 MCG (5000 UT) CAPS TAKE 1 CAPSULE BY MOUTH ONCE A DAY   citalopram (CELEXA) 20 MG tablet TAKE 1 TABLET (20 MG TOTAL) BY MOUTH DAILY.   Evolocumab (REPATHA SURECLICK) 915 MG/ML SOAJ INJECT 140 MG INTO THE SKIN EVERY 14 DAYS   glucose blood test strip Use to check blood sugar twice daily.   Accu-Chek Guide Test Strip   glucose blood test strip USE AS DIRECTED TO CHECK BLOOD SUGAR ONCE DAILY (Patient taking differently: USE AS DIRECTED TO CHECK BLOOD SUGAR ONCE DAILY)   Lancets (FREESTYLE) lancets USE AS DIRECTED TO CHECK BLOOD SUGAR ONCE DAILY (Patient taking differently: USE AS DIRECTED TO CHECK BLOOD SUGAR ONCE DAILY)   loratadine (CLARITIN) 10 MG tablet Take 10 mg by mouth daily.   losartan (COZAAR) 100 MG tablet TAKE 1 TABLET BY MOUTH ONCE DAILY   metFORMIN (GLUCOPHAGE) 1000 MG tablet TAKE 1 TABLET BY MOUTH TWICE DAILY WITH A MEAL   pantoprazole (PROTONIX) 40 MG tablet TAKE 1 TABLET BY MOUTH ONCE DAILY   tirzepatide (MOUNJARO) 5 MG/0.5ML Pen Inject 5 mg into the skin once a week.   [DISCONTINUED] Dulaglutide (TRULICITY) 1.5 AV/6.9VX SOPN INJECT 1 PEN INTO THE SKIN ONCE A WEEK   No facility-administered encounter medications on file as of 08/05/2021.    ALLERGIES: Allergies  Allergen Reactions   Ezetimibe-Simvastatin Other (See Comments)    REACTION: muscle aches   Pravachol [Pravastatin Sodium] Other (See Comments)    Joint pains and memory loss   Simvastatin Other (See Comments)    REACTION: muscle aches   Statins    Zetia [Ezetimibe] Other (See Comments)    Generalized joint pains    VACCINATION STATUS: Immunization  History  Administered Date(s) Administered   Influenza Split 07/19/2013, 07/22/2014   Influenza-Unspecified 07/28/2020   Moderna Sars-Covid-2 Vaccination 11/05/2019, 11/13/2019, 09/10/2020   Pneumococcal Conjugate-13 06/25/2014   Pneumococcal Polysaccharide-23 07/29/2019   Tdap 06/25/2014   Zoster Recombinat (Shingrix) 06/12/2018    Diabetes She presents for her follow-up diabetic  visit. She has type 2 diabetes mellitus. Onset time: She was diagnosed at approximate age of 38 years. Her disease course has been worsening. Pertinent negatives for hypoglycemia include no confusion, headaches, hunger, pallor or seizures. Pertinent negatives for diabetes include no chest pain, no fatigue, no polydipsia, no polyphagia, no polyuria and no weight loss. There are no hypoglycemic complications. Symptoms are stable. There are no diabetic complications. Risk factors for coronary artery disease include diabetes mellitus, dyslipidemia, hypertension, tobacco exposure and post-menopausal. Current diabetic treatment includes oral agent (monotherapy). She is compliant with treatment all of the time. Her weight is fluctuating minimally. She is following a generally healthy diet. When asked about meal planning, she reported none. She has not had a previous visit with a dietitian. She participates in exercise intermittently. Her home blood glucose trend is increasing steadily. Her breakfast blood glucose range is generally >200 mg/dl. Her overall blood glucose range is >200 mg/dl. (She presents today with no meter or logs to review.  She does routinely monitor glucose in the mornings.  Her POCT A1c today is 9.4%, worsening from last visit of 7.5%.  She reports she has given up wine and sugar but still has erratic eating patterns due to her occupation.  She has tried dieting in the past and does well but has hard time maintaining such restrictive diets.  She denies any hypoglycemia.) An ACE inhibitor/angiotensin II receptor  blocker is being taken. She does not see a podiatrist.Eye exam is current.  Hyperlipidemia This is a chronic problem. The current episode started more than 1 year ago. The problem is controlled. Recent lipid tests were reviewed and are normal. Exacerbating diseases include diabetes. There are no known factors aggravating her hyperlipidemia. Pertinent negatives include no chest pain, myalgias or shortness of breath. Treatments tried: Patient is not tolerating statins including Livalo.  She is on Repatha. The current treatment provides moderate improvement of lipids. There are no compliance problems.  Risk factors for coronary artery disease include dyslipidemia, diabetes mellitus, hypertension and post-menopausal.  Hypertension This is a chronic problem. The current episode started more than 1 year ago. The problem has been gradually improving since onset. The problem is controlled. Pertinent negatives include no chest pain, headaches, palpitations or shortness of breath. There are no associated agents to hypertension. Risk factors for coronary artery disease include dyslipidemia, diabetes mellitus and smoking/tobacco exposure. Past treatments include calcium channel blockers and angiotensin blockers. The current treatment provides moderate improvement. There are no compliance problems.  Identifiable causes of hypertension include sleep apnea.    Review of systems  Constitutional: + Minimally fluctuating body weight,  current Body mass index is 34.84 kg/m. , + fatigue, no subjective hyperthermia, no subjective hypothermia Eyes: no blurry vision, no xerophthalmia ENT: no sore throat, no nodules palpated in throat, no dysphagia/odynophagia, no hoarseness Cardiovascular: no chest pain, no shortness of breath, no palpitations, no leg swelling Respiratory: no cough, no shortness of breath Gastrointestinal: no nausea/vomiting/diarrhea Musculoskeletal: no muscle/joint aches Skin: no rashes, no  hyperemia Neurological: no tremors, no numbness, no tingling, no dizziness Psychiatric: no depression, no anxiety   Objective:    BP 134/76   Pulse 69   Ht 5' 2.25" (1.581 m)   Wt 192 lb (87.1 kg)   BMI 34.84 kg/m   Wt Readings from Last 3 Encounters:  08/05/21 192 lb (87.1 kg)  07/13/21 194 lb (88 kg)  02/15/21 185 lb (83.9 kg)   BP Readings from Last 3 Encounters:  08/05/21 134/76  07/13/21 (!) 150/87  02/15/21 128/72    Physical Exam- Limited  Constitutional:  Body mass index is 34.84 kg/m. , not in acute distress, normal state of mind Eyes:  EOMI, no exophthalmos Neck: Supple Cardiovascular: RRR, no murmurs, rubs, or gallops, no edema Respiratory: Adequate breathing efforts, no crackles, rales, rhonchi, or wheezing Musculoskeletal: no gross deformities, strength intact in all four extremities, no gross restriction of joint movements Skin:  no rashes, no hyperemia Neurological: no tremor with outstretched hands    CMP     Component Value Date/Time   NA 131 (L) 08/04/2021 0908   K 4.3 08/04/2021 0908   CL 96 (L) 08/04/2021 0908   CO2 25 08/04/2021 0908   GLUCOSE 223 (H) 08/04/2021 0908   BUN 15 08/04/2021 0908   CREATININE 0.58 08/04/2021 0908   CREATININE 0.84 11/18/2016 0844   CALCIUM 8.8 (L) 08/04/2021 0908   PROT 7.4 08/04/2021 0908   ALBUMIN 4.0 08/04/2021 0908   AST 31 08/04/2021 0908   ALT 40 08/04/2021 0908   ALKPHOS 77 08/04/2021 0908   BILITOT 0.4 08/04/2021 0908   GFRNONAA >60 08/04/2021 0908   GFRNONAA 77 11/18/2016 0844   GFRAA >60 08/03/2020 0939   GFRAA 89 11/18/2016 0844    Diabetic Labs (most recent): Lab Results  Component Value Date   HGBA1C 9.4 (A) 08/05/2021   HGBA1C 7.5 (H) 01/25/2021   HGBA1C 6.0 (A) 08/05/2020     Lipid Panel ( most recent) Lipid Panel     Component Value Date/Time   CHOL 129 08/04/2021 0908   TRIG 152 (H) 08/04/2021 0908   HDL 54 08/04/2021 0908   CHOLHDL 2.4 08/04/2021 0908   VLDL 30 08/04/2021  0908   LDLCALC 45 08/04/2021 0908      Lab Results  Component Value Date   TSH 2.286 08/04/2021   TSH 2.047 01/25/2021   TSH 1.827 08/03/2020   TSH 1.483 08/01/2019   TSH 1.601 06/08/2018   TSH 1.724 06/08/2017   TSH 1.620 06/08/2015   TSH 1.739 06/23/2014   TSH 1.659 04/15/2013   TSH 1.634 01/11/2012   FREET4 0.97 08/04/2021   FREET4 0.92 06/08/2018      Assessment & Plan:   1) Uncontrolled type 2 diabetes mellitus with hyperglycemia ([place])  - [place] has currently uncontrolled symptomatic type 2 DM since 60 years of age.  She presents today with no meter or logs to review.  She does routinely monitor glucose in the mornings.  Her POCT A1c today is 9.4%, worsening from last visit of 7.5%.  She reports she has given up wine and sugar but still has erratic eating patterns due to her occupation.  She has tried dieting in the past and does well but has hard time maintaining such restrictive diets.  She denies any hypoglycemia.  -She does not report any gross complications from her diabetes, however she remains at a high risk for acute and chronic complications of type 2 diabetes which include CAD, CVA, CKD, retinopathy, and neuropathy. These are all discussed in l with the patient.  - Nutritional counseling repeated at each appointment due to patients tendency to fall back in to old habits.  - The patient admits there is a room for improvement in their diet and drink choices. -  Suggestion is made for the patient to avoid simple carbohydrates from their diet including Cakes, Sweet Desserts / Pastries, Ice Cream, Soda (diet and regular), Sweet Tea, Candies, Chips, Cookies, Sweet Pastries, Store Bought  Juices, Alcohol in Excess of 1-2 drinks a day, Artificial Sweeteners, Coffee Creamer, and "Sugar-free" Products. This will help patient to have stable blood glucose profile and potentially avoid unintended weight gain.   - I encouraged the patient to switch to unprocessed or  minimally processed complex starch and increased protein intake (animal or plant source), fruits, and vegetables.   - Patient is advised to stick to a routine mealtimes to eat 3 meals a day and avoid unnecessary snacks (to snack only to correct hypoglycemia).  - she has seen Jearld Fenton, RDN, CDE for individualized diabetes education.  - I have approached her with the following individualized plan to manage diabetes and patient agrees:   -She is advised to continue her Metformin 1000 mg po twice daily with meals.  Will initiate switch to [facility] to assist with more weight loss.  She is advised to start Mounjaro 2.5 mg SQ weekly x 4 weeks (samples provided from office) then increase to 5 mg SQ weekly thereafter if tolerated well.  We discussed the potential need to add basal insulin at next visit if unable to get diabetes under control with these 2 agents alone.    - Patient specific target  A1c;  LDL, HDL, Triglycerides, were discussed in detail.  2) BP/HTN:  Her blood pressure is controlled to target.  She is advised to continue Norvasc 10 mg po daily and Losartan 100 mg po daily.  3) Lipids/HPL:  Her most recent lipid panel from 08/04/21 shows controlled LDL of 45- improving since starting Repatha.  She is advised to continue Repatha 140 mg SQ every other week.  She is intolerant to statins.  4)  Weight/Diet:  Her Body mass index is 34.84 kg/m.  She would benefit from more weight loss.  CDE Consult has been  initiated , exercise, and detailed carbohydrates information provided.  5) Chronic Care/Health Maintenance: -she is on ACEI/ARB medications and is encouraged to continue to follow up with Ophthalmology, Dentist, Podiatrist at least yearly or according to recommendations, and advised to stay away from smoking. I have recommended yearly flu vaccine and pneumonia vaccination at least every 5 years; moderate intensity exercise for up to 150 minutes weekly; and  sleep for at least 7 hours  a day.  - I advised patient to maintain close follow up with Fayrene Helper, MD for primary care needs.     I spent 30 minutes in the care of the patient today including review of labs from [facility], Lipids, Thyroid Function, Hematology (current and previous including abstractions from other facilities); face-to-face time discussing  her blood glucose readings/logs, discussing hypoglycemia and hyperglycemia episodes and symptoms, medications doses, her options of short and long term treatment based on the latest standards of care / guidelines;  discussion about incorporating lifestyle medicine;  and documenting the encounter.    Please refer to Patient Instructions for Blood Glucose Monitoring and Insulin/Medications Dosing Guide"  in media tab for additional information. Please  also refer to " Patient Self Inventory" in the Media  tab for reviewed elements of pertinent patient history.  Alicia Neal participated in the discussions, expressed understanding, and voiced agreement with the above plans.  All questions were answered to her satisfaction. she is encouraged to contact clinic should she have any questions or concerns prior to her return visit.    Follow up plan: - Return in about 4 months (around 12/05/2021) for Diabetes F/U- A1c and UM in office, No previsit labs.  Rayetta Pigg,  FNP-BC [facility] [address] [place], [place] [address] Phone: [phone] Fax: [phone]  08/05/2021, 9:03 AM

## 2021-08-05 NOTE — Progress Notes (Signed)
Chief Complaint  Patient presents with   Knee Pain    Right fell down stairs 08/01/21   60 year old female diabetic fell down the stairs 4 days ago had an x-ray done it was negative comes in complaining of pain in the proximal tibia and right knee pain improving  BP (!) 140/91   Pulse 80   Ht 5' 2.5" (1.588 m)   Wt 190 lb (86.2 kg)   BMI 34.20 kg/m   Right knee has a small effusion she does have full range of motion she has full extension with no extensor lag there is no laxity in the joint there is a bruise over the proximal lateral tibia and she is tender in this area  X-ray done at the hospital was negative except for the effusion  Impression contusion right knee  Recommend brace Ice at the end of the day  She declined a brace although she seemed to be having a lot of pain with weightbearing  She will let me know if she does not improve  Encounter Diagnosis  Name Primary?   Acute pain of right knee Yes

## 2021-08-05 NOTE — Patient Instructions (Signed)

## 2021-08-06 ENCOUNTER — Other Ambulatory Visit (HOSPITAL_COMMUNITY): Payer: Self-pay

## 2021-08-21 ENCOUNTER — Other Ambulatory Visit (HOSPITAL_COMMUNITY): Payer: Self-pay

## 2021-08-24 ENCOUNTER — Other Ambulatory Visit: Payer: Self-pay

## 2021-08-24 ENCOUNTER — Ambulatory Visit: Payer: 59 | Admitting: Family Medicine

## 2021-08-24 ENCOUNTER — Ambulatory Visit (HOSPITAL_COMMUNITY)
Admission: RE | Admit: 2021-08-24 | Discharge: 2021-08-24 | Disposition: A | Discharge status: Home / Self Care | Payer: 59 | Source: Ambulatory Visit | Attending: Family Medicine | Admitting: Family Medicine

## 2021-08-24 ENCOUNTER — Encounter: Payer: Self-pay | Admitting: Family Medicine

## 2021-08-24 VITALS — BP 133/82 | HR 72 | Resp 19 | Ht 62.0 in | Wt 187.0 lb

## 2021-08-24 DIAGNOSIS — I10 Essential (primary) hypertension: Secondary | ICD-10-CM | POA: Diagnosis not present

## 2021-08-24 DIAGNOSIS — M542 Cervicalgia: Secondary | ICD-10-CM | POA: Insufficient documentation

## 2021-08-24 DIAGNOSIS — M546 Pain in thoracic spine: Secondary | ICD-10-CM | POA: Insufficient documentation

## 2021-08-24 DIAGNOSIS — E785 Hyperlipidemia, unspecified: Secondary | ICD-10-CM

## 2021-08-24 DIAGNOSIS — F411 Generalized anxiety disorder: Secondary | ICD-10-CM | POA: Diagnosis not present

## 2021-08-24 DIAGNOSIS — E559 Vitamin D deficiency, unspecified: Secondary | ICD-10-CM

## 2021-08-24 DIAGNOSIS — K219 Gastro-esophageal reflux disease without esophagitis: Secondary | ICD-10-CM | POA: Diagnosis not present

## 2021-08-24 DIAGNOSIS — E669 Obesity, unspecified: Secondary | ICD-10-CM | POA: Diagnosis not present

## 2021-08-24 DIAGNOSIS — E1159 Type 2 diabetes mellitus with other circulatory complications: Secondary | ICD-10-CM | POA: Diagnosis not present

## 2021-08-24 NOTE — Assessment & Plan Note (Signed)
Hyperlipidemia:Low fat diet discussed and encouraged.   Lipid Panel  Lab Results  Component Value Date   CHOL 129 08/04/2021   HDL 54 08/04/2021   LDLCALC 45 08/04/2021   TRIG 152 (H) 08/04/2021   CHOLHDL 2.4 08/04/2021     Needs to reduce fat intake

## 2021-08-24 NOTE — Assessment & Plan Note (Signed)
Present x 1 year and worsening with RUE weakness, x ray then MRI,no interest in medication currently

## 2021-08-24 NOTE — Assessment & Plan Note (Signed)
Uncontrolled, followed by Endo, recent start of mounjaro , now blood sugar improving but still elevated Alicia Neal is reminded of the importance of commitment to daily physical activity for 30 minutes or more, as able and the need to limit carbohydrate intake to 30 to 60 grams per meal to help with blood sugar control.   The need to take medication as prescribed, test blood sugar as directed, and to call between visits if there is a concern that blood sugar is uncontrolled is also discussed.   Alicia Neal is reminded of the importance of daily foot exam, annual eye examination, and good blood sugar, blood pressure and cholesterol control.  Diabetic Labs Latest Ref Rng & Units 08/05/2021 08/04/2021 01/25/2021 08/05/2020 08/03/2020  HbA1c 0.0 - 7.0 % 9.4(A) - 7.5(H) 6.0(A) -  Microalbumin Not Estab. ug/mL - - <3.0(H) - <3.0(H)  Micro/Creat Ratio 0 - 29 mg/g creat - - <5 - -  Chol 0 - 200 mg/dL - 129 165 - 195  HDL >40 mg/dL - 54 65 - 70  Calc LDL 0 - 99 mg/dL - 45 76 - 99  Triglycerides <150 mg/dL - 152(H) 121 - 131  Creatinine 0.44 - 1.00 mg/dL - 0.58 0.65 - 0.59   BP/Weight 08/24/2021 08/05/2021 08/05/2021 07/13/2021 02/15/2021 02/02/2021 05/69/7948  Systolic BP 016 553 748 270 786 754 492  Diastolic BP 82 91 76 87 72 80 78  Wt. (Lbs) 187 190 192 194 185 185 182  BMI 34.2 34.2 34.84 35.2 34.96 34.39 33.83   Foot/eye exam completion dates Latest Ref Rng & Units 08/24/2021 03/15/2021  Eye Exam No Retinopathy - No Retinopathy  Foot Form Completion - Done -

## 2021-08-24 NOTE — Assessment & Plan Note (Signed)
Controlled, no change in medication  

## 2021-08-24 NOTE — Assessment & Plan Note (Signed)
Controlled, no change in medication DASH diet and commitment to daily physical activity for a minimum of 30 minutes discussed and encouraged, as a part of hypertension management. The importance of attaining a healthy weight is also discussed.  BP/Weight 08/24/2021 08/05/2021 08/05/2021 07/13/2021 02/15/2021 02/02/2021 46/02/7997  Systolic BP 721 587 276 184 859 276 394  Diastolic BP 82 91 76 87 72 80 78  Wt. (Lbs) 187 190 192 194 185 185 182  BMI 34.2 34.2 34.84 35.2 34.96 34.39 33.83

## 2021-08-24 NOTE — Patient Instructions (Addendum)
F/u in 4 months, call if you need me sooner  Fasting lipid, cmp and eGFR and cBC and vit d 3 to 5 days before visit  X ray of neck and upper back toady please'  It is important that you exercise regularly at least 30 minutes 5 times a week. If you develop chest pain, have severe difficulty breathing, or feel very tired, stop exercising immediately and seek medical attention    Contact Endo if FBG remains over 150 in November    Thanks for choosing [facility], we consider it a privelige to serve you.

## 2021-08-24 NOTE — Assessment & Plan Note (Signed)
  Patient re-educated about  the importance of commitment to a  minimum of 150 minutes of exercise per week as able.  The importance of healthy food choices with portion control discussed, as well as eating regularly and within a 12 hour window most days. The need to choose "clean , green" food 50 to 75% of the time is discussed, as well as to make water the primary drink and set a goal of 64 ounces water daily.    Weight /BMI 08/24/2021 08/05/2021 08/05/2021  WEIGHT 187 lb 190 lb 192 lb  HEIGHT 5\' 2"  5' 2.5" 5' 2.25"  BMI 34.2 kg/m2 34.2 kg/m2 34.84 kg/m2

## 2021-08-24 NOTE — Progress Notes (Signed)
Alicia Neal     MRN: [id number]      DOB: 1961-02-18   HPI Alicia Neal is here for follow up and re-evaluation of chronic medical conditions, medication management and review of any available recent lab and radiology data.  Preventive health is updated, specifically  Cancer screening and Immunization.   Questions or concerns regarding consultations or procedures which the PT has had in the interim are  addressed. The PT denies any adverse reactions to current medications since the last visit.  Right shoulder and upper extremity pain and weakness x 1 year, noted to worsen in past 4 months, pain is a 10, wakes her up and experiences s[opraduic weakness  ROS Denies recent fever or chills. Denies sinus pressure, nasal congestion, ear pain or sore throat. Denies chest congestion, productive cough or wheezing. Denies chest pains, palpitations and leg swelling Denies abdominal pain, nausea, vomiting,diarrhea or constipation.   Denies dysuria, frequency, hesitancy or incontinence. Denies depression, uncontrolled anxiety or insomnia. Denies skin break down or rash.   PE  BP 133/82   Pulse 72   Resp 19   Ht 5\' 2"  (1.575 m)   Wt 187 lb (84.8 kg)   SpO2 95%   BMI 34.20 kg/m   Patient alert and oriented and in no cardiopulmonary distress.  HEENT: No facial asymmetry, EOMI,     Neck decreased ROM .  Chest: Clear to auscultation bilaterally.  CVS: S1, S2 no murmurs, no S3.Regular rate.  ABD: Soft non tender.   Ext: No edema  MS: Adequate ROM spine, shoulders, hips and knees.  Skin: Intact, no ulcerations or rash noted.  Psych: Good eye contact, normal affect. Memory intact not anxious or depressed appearing.  CNS: CN 2-12 intact, grade 4 power, in RUE, otherwise   normal   Assessment & Plan  HTN, goal below 130/80 Controlled, no change in medication DASH diet and commitment to daily physical activity for a minimum of 30 minutes discussed and encouraged, as a part of  hypertension management. The importance of attaining a healthy weight is also discussed.  BP/Weight 08/24/2021 08/05/2021 08/05/2021 07/13/2021 02/15/2021 02/02/2021 83/38/2505  Systolic BP 397 673 419 379 024 097 353  Diastolic BP 82 91 76 87 72 80 78  Wt. (Lbs) 187 190 192 194 185 185 182  BMI 34.2 34.2 34.84 35.2 34.96 34.39 33.83       Mildly obese  Patient re-educated about  the importance of commitment to a  minimum of 150 minutes of exercise per week as able.  The importance of healthy food choices with portion control discussed, as well as eating regularly and within a 12 hour window most days. The need to choose "clean , green" food 50 to 75% of the time is discussed, as well as to make water the primary drink and set a goal of 64 ounces water daily.    Weight /BMI 08/24/2021 08/05/2021 08/05/2021  WEIGHT 187 lb 190 lb 192 lb  HEIGHT 5\' 2"  5' 2.5" 5' 2.25"  BMI 34.2 kg/m2 34.2 kg/m2 34.84 kg/m2      Neck pain on right side Present x 1 year and worsening with RUE weakness, x ray then MRI,no interest in medication currently  Hyperlipidemia LDL goal <100 Hyperlipidemia:Low fat diet discussed and encouraged.   Lipid Panel  Lab Results  Component Value Date   CHOL 129 08/04/2021   HDL 54 08/04/2021   LDLCALC 45 08/04/2021   TRIG 152 (H) 08/04/2021   CHOLHDL 2.4  08/04/2021     Needs to reduce fat intake  GAD (generalized anxiety disorder) Controlled, no change in medication   GERD (gastroesophageal reflux disease) Controlled, no change in medication   Type 2 diabetes mellitus with vascular disease ([place]) Uncontrolled, followed by Endo, recent start of mounjaro , now blood sugar improving but still elevated Alicia Neal is reminded of the importance of commitment to daily physical activity for 30 minutes or more, as able and the need to limit carbohydrate intake to 30 to 60 grams per meal to help with blood sugar control.   The need to take medication as  prescribed, test blood sugar as directed, and to call between visits if there is a concern that blood sugar is uncontrolled is also discussed.   Alicia Neal is reminded of the importance of daily foot exam, annual eye examination, and good blood sugar, blood pressure and cholesterol control.  Diabetic Labs Latest Ref Rng & Units 08/05/2021 08/04/2021 01/25/2021 08/05/2020 08/03/2020  HbA1c 0.0 - 7.0 % 9.4(A) - 7.5(H) 6.0(A) -  Microalbumin Not Estab. ug/mL - - <3.0(H) - <3.0(H)  Micro/Creat Ratio 0 - 29 mg/g creat - - <5 - -  Chol 0 - 200 mg/dL - 129 165 - 195  HDL >40 mg/dL - 54 65 - 70  Calc LDL 0 - 99 mg/dL - 45 76 - 99  Triglycerides <150 mg/dL - 152(H) 121 - 131  Creatinine 0.44 - 1.00 mg/dL - 0.58 0.65 - 0.59   BP/Weight 08/24/2021 08/05/2021 08/05/2021 07/13/2021 02/15/2021 02/02/2021 32/76/1470  Systolic BP 929 574 734 037 096 438 381  Diastolic BP 82 91 76 87 72 80 78  Wt. (Lbs) 187 190 192 194 185 185 182  BMI 34.2 34.2 34.84 35.2 34.96 34.39 33.83   Foot/eye exam completion dates Latest Ref Rng & Units 08/24/2021 03/15/2021  Eye Exam No Retinopathy - No Retinopathy  Foot Form Completion - Done -

## 2021-08-25 ENCOUNTER — Other Ambulatory Visit: Payer: Self-pay | Admitting: Family Medicine

## 2021-08-25 DIAGNOSIS — M542 Cervicalgia: Secondary | ICD-10-CM

## 2021-08-25 NOTE — Progress Notes (Signed)
I c spine

## 2021-09-07 ENCOUNTER — Other Ambulatory Visit: Payer: Self-pay

## 2021-09-07 ENCOUNTER — Ambulatory Visit (HOSPITAL_COMMUNITY)
Admission: RE | Admit: 2021-09-07 | Discharge: 2021-09-07 | Disposition: A | Discharge status: Home / Self Care | Payer: 59 | Source: Ambulatory Visit | Attending: Family Medicine | Admitting: Family Medicine

## 2021-09-07 DIAGNOSIS — M542 Cervicalgia: Secondary | ICD-10-CM | POA: Insufficient documentation

## 2021-09-09 ENCOUNTER — Other Ambulatory Visit: Payer: Self-pay

## 2021-09-09 DIAGNOSIS — M549 Dorsalgia, unspecified: Secondary | ICD-10-CM

## 2021-09-09 NOTE — Progress Notes (Signed)
Pt prefers dr Vertell Limber

## 2021-09-10 ENCOUNTER — Ambulatory Visit (HOSPITAL_COMMUNITY): Payer: 59

## 2021-09-13 ENCOUNTER — Other Ambulatory Visit: Payer: Self-pay | Admitting: Nurse Practitioner

## 2021-09-13 ENCOUNTER — Other Ambulatory Visit (HOSPITAL_COMMUNITY): Payer: Self-pay

## 2021-09-13 DIAGNOSIS — I10 Essential (primary) hypertension: Secondary | ICD-10-CM

## 2021-09-13 MED ORDER — AMLODIPINE BESYLATE 10 MG PO TABS
ORAL_TABLET | Freq: Every day | ORAL | 0 refills | Status: DC
  Filled 2021-09-13: qty 90, 90d supply, fill #0

## 2021-09-23 DIAGNOSIS — I10 Essential (primary) hypertension: Secondary | ICD-10-CM | POA: Diagnosis not present

## 2021-09-23 DIAGNOSIS — M4722 Other spondylosis with radiculopathy, cervical region: Secondary | ICD-10-CM | POA: Diagnosis not present

## 2021-09-23 DIAGNOSIS — Z6832 Body mass index (BMI) 32.0-32.9, adult: Secondary | ICD-10-CM | POA: Diagnosis not present

## 2021-10-05 ENCOUNTER — Other Ambulatory Visit: Payer: Self-pay | Admitting: "Endocrinology

## 2021-10-06 ENCOUNTER — Other Ambulatory Visit (HOSPITAL_COMMUNITY): Payer: Self-pay

## 2021-10-06 MED ORDER — METFORMIN HCL 1000 MG PO TABS
ORAL_TABLET | Freq: Two times a day (BID) | ORAL | 0 refills | Status: DC
  Filled 2021-10-06: qty 180, 90d supply, fill #0

## 2021-10-12 ENCOUNTER — Other Ambulatory Visit (HOSPITAL_COMMUNITY): Payer: Self-pay

## 2021-10-12 ENCOUNTER — Other Ambulatory Visit: Payer: Self-pay | Admitting: Nurse Practitioner

## 2021-10-12 MED ORDER — REPATHA SURECLICK 140 MG/ML ~~LOC~~ SOAJ
SUBCUTANEOUS | 2 refills | Status: DC
  Filled 2021-10-12: qty 2, 30d supply, fill #0
  Filled 2021-11-27: qty 2, 28d supply, fill #1
  Filled 2022-01-09: qty 2, 28d supply, fill #2

## 2021-10-25 ENCOUNTER — Other Ambulatory Visit: Payer: Self-pay | Admitting: *Deleted

## 2021-10-25 DIAGNOSIS — Z87891 Personal history of nicotine dependence: Secondary | ICD-10-CM

## 2021-10-31 ENCOUNTER — Other Ambulatory Visit (HOSPITAL_COMMUNITY): Payer: Self-pay

## 2021-11-02 ENCOUNTER — Other Ambulatory Visit (HOSPITAL_COMMUNITY): Payer: Self-pay

## 2021-11-07 ENCOUNTER — Other Ambulatory Visit: Payer: Self-pay | Admitting: Family Medicine

## 2021-11-08 ENCOUNTER — Other Ambulatory Visit (HOSPITAL_COMMUNITY): Payer: Self-pay

## 2021-11-08 MED ORDER — CITALOPRAM HYDROBROMIDE 20 MG PO TABS
ORAL_TABLET | Freq: Every day | ORAL | 3 refills | Status: DC
  Filled 2021-11-08: qty 90, 90d supply, fill #0
  Filled 2022-02-04: qty 90, 90d supply, fill #1
  Filled 2022-05-04: qty 90, 90d supply, fill #2
  Filled 2022-07-31: qty 90, 90d supply, fill #3

## 2021-11-17 ENCOUNTER — Encounter: Payer: Self-pay | Admitting: Family Medicine

## 2021-11-18 ENCOUNTER — Encounter: Payer: Self-pay | Admitting: Internal Medicine

## 2021-11-18 ENCOUNTER — Other Ambulatory Visit: Payer: Self-pay

## 2021-11-18 ENCOUNTER — Ambulatory Visit (INDEPENDENT_AMBULATORY_CARE_PROVIDER_SITE_OTHER): Payer: 59 | Admitting: Internal Medicine

## 2021-11-18 DIAGNOSIS — U071 COVID-19: Secondary | ICD-10-CM

## 2021-11-18 MED ORDER — NIRMATRELVIR/RITONAVIR (PAXLOVID)TABLET: 3.0000 | ORAL_TABLET | Freq: Two times a day (BID) | ORAL | 0 refills | Status: AC

## 2021-11-18 NOTE — Progress Notes (Signed)
Virtual Visit via Telephone Note   This visit type was conducted due to national recommendations for restrictions regarding the COVID-19 Pandemic (e.g. social distancing) in an effort to limit this patient's exposure and mitigate transmission in our community.  Due to her co-morbid illnesses, this patient is at least at moderate risk for complications without adequate follow up.  This format is felt to be most appropriate for this patient at this time.  The patient did not have access to video technology/had technical difficulties with video requiring transitioning to audio format only (telephone).  All issues noted in this document were discussed and addressed.  No physical exam could be performed with this format.  Evaluation Performed:  Follow-up visit  Date:  11/18/2021   ID:  Alicia Neal, Alicia Neal 04-16-1961, MRN [id number]  Patient Location: Home Provider Location: Office/Clinic  Participants: Patient Location of Patient: Home Location of Provider: Telehealth Consent was obtain for visit to be over via telehealth. I verified that I am speaking with the correct person using two identifiers.  PCP:  Fayrene Helper, MD   Chief Complaint: Cough and nasal congestion  History of Present Illness:    Alicia Neal is a 61 y.o. female who has a televisit for complaint of cough and nasal congestion for last 2 days.  She tested positive for COVID yesterday.  She denies any fever, dyspnea or wheezing currently.  She has had COVID vaccines.  The patient does have symptoms concerning for COVID-19 infection (fever, chills, cough, or new shortness of breath).   Past Medical, Surgical, Social History, Allergies, and Medications have been Reviewed.  Past Medical History:  Diagnosis Date   Adrenal adenoma    right side   Allergic rhinitis    Allergy    cat,dog,animal dander   Anxiety disorder    Carpal tunnel syndrome of left wrist    had surgery to repair   Chronic depression     Dr. Cheryln Manly   GERD (gastroesophageal reflux disease)    History of abnormal cervical Pap smear    History of adenomatous polyp of colon    History of hiatal hernia    Hyperlipidemia    Hypertension    IBS (irritable bowel syndrome)    OSA (obstructive sleep apnea)    per pt study yrs ago-- CPAP intolerant    Pemphigoid, cicatricial    pruritic autoimmune blistering skin disorder   PONV (postoperative nausea and vomiting)    Pulmonary nodule, right pulmologist-  dr Nathaneil Canary mcquaid   x2    per CT 09-11-2015   Trigger finger, left    ring finger   Type 2 diabetes mellitus ([place])    Uncontrolled type 2 diabetes mellitus with hyperglycemia ([place]) 04/17/2018   Wears glasses    Past Surgical History:  Procedure Laterality Date   CARDIOVASCULAR STRESS TEST  04-30-2003   Small focus of decreased perfusion on rest images in the mid-distal anterior wall (worrisome for ischemia),  normal LV function and wall motion , ef 60%   CARPAL TUNNEL RELEASE Left 11/26/2015   Procedure: CARPAL TUNNEL RELEASE;  Surgeon: Iran Planas, MD;  Location: [facility];  Service: Orthopedics;  Laterality: Left;   CHOLECYSTECTOMY  1989   COLONOSCOPY  11/04/2009   POLYPECTOMY     TRANSTHORACIC ECHOCARDIOGRAM  04-30-2003   mild hypokinetic in the inferior and posterior wall, ef 50%/  trivial MR /  mild TR   TRIGGER FINGER RELEASE Left 11/26/2015   Procedure: RELEASE TRIGGER  FINGER/A-1 PULLEY LEFT RING  FINGER;  Surgeon: Iran Planas, MD;  Location: [facility];  Service: Orthopedics;  Laterality: Left;   TUBAL LIGATION  2000     Current Meds  Medication Sig   amLODipine (NORVASC) 10 MG tablet TAKE 1 TABLET BY MOUTH DAILY   blood glucose meter kit and supplies Dispense Freestyle meter and supplies for once daily testing dx e11.9   Cholecalciferol (VITAMIN D) 125 MCG (5000 UT) CAPS TAKE 1 CAPSULE BY MOUTH ONCE A DAY   citalopram (CELEXA) 20 MG tablet TAKE 1 TABLET (20 MG TOTAL) BY MOUTH DAILY.   Evolocumab (REPATHA  SURECLICK) 419 MG/ML SOAJ INJECT 140 MG INTO THE SKIN EVERY 14 DAYS   glucose blood test strip Use to check blood sugar twice daily.   Accu-Chek Guide Test Strip   ibuprofen (ADVIL) 200 MG tablet Take 800 mg by mouth at bedtime.   loratadine (CLARITIN) 10 MG tablet Take 10 mg by mouth daily.   losartan (COZAAR) 100 MG tablet TAKE 1 TABLET BY MOUTH ONCE DAILY   metFORMIN (GLUCOPHAGE) 1000 MG tablet TAKE 1 TABLET BY MOUTH TWICE DAILY WITH MEALS.   pantoprazole (PROTONIX) 40 MG tablet TAKE 1 TABLET BY MOUTH ONCE DAILY   tirzepatide (MOUNJARO) 5 MG/0.5ML Pen Inject 5 mg into the skin once a week.     Allergies:   Ezetimibe-simvastatin, Pravachol [pravastatin sodium], Simvastatin, Statins, and Zetia [ezetimibe]   ROS:   Please see the history of present illness.     All other systems reviewed and are negative.   Labs/Other Tests and Data Reviewed:    Recent Labs: 08/04/2021: ALT 40; BUN 15; Creatinine, Ser 0.58; Potassium 4.3; Sodium 131; TSH 2.286   Recent Lipid Panel Lab Results  Component Value Date/Time   CHOL 129 08/04/2021 09:08 AM   TRIG 152 (H) 08/04/2021 09:08 AM   HDL 54 08/04/2021 09:08 AM   CHOLHDL 2.4 08/04/2021 09:08 AM   LDLCALC 45 08/04/2021 09:08 AM    Wt Readings from Last 3 Encounters:  08/24/21 187 lb (84.8 kg)  08/05/21 190 lb (86.2 kg)  08/05/21 192 lb (87.1 kg)     ASSESSMENT & PLAN:    COVID-19 infection Started Paxlovid Advised to take Mucinex or Robitussin as needed for cough Continue to self quarantine for 3 additional days or at least 24-hour afebrile period, whichever is later  Time:   Today, I have spent 9 minutes reviewing the chart, including problem list, medications, and with the patient with telehealth technology discussing the above problems.   Medication Adjustments/Labs and Tests Ordered: Current medicines are reviewed at length with the patient today.  Concerns regarding medicines are outlined above.   Tests Ordered: No orders of  the defined types were placed in this encounter.   Medication Changes: No orders of the defined types were placed in this encounter.    Note: This dictation was prepared with Dragon dictation along with smaller phrase technology. Similar sounding words can be transcribed inadequately or may not be corrected upon review. Any transcriptional errors that result from this process are unintentional.      Disposition:  Follow up  Signed, Lindell Spar, MD  11/18/2021 12:53 PM     [facility]

## 2021-11-27 ENCOUNTER — Other Ambulatory Visit (HOSPITAL_COMMUNITY): Payer: Self-pay

## 2021-11-30 ENCOUNTER — Other Ambulatory Visit: Payer: Self-pay | Admitting: "Endocrinology

## 2021-11-30 ENCOUNTER — Other Ambulatory Visit (HOSPITAL_COMMUNITY): Payer: Self-pay

## 2021-11-30 MED ORDER — METFORMIN HCL 1000 MG PO TABS
ORAL_TABLET | Freq: Two times a day (BID) | ORAL | 0 refills | Status: DC
  Filled 2021-11-30: qty 180, fill #0
  Filled 2022-01-03: qty 180, 90d supply, fill #0

## 2021-12-02 ENCOUNTER — Ambulatory Visit (HOSPITAL_COMMUNITY)
Admission: RE | Admit: 2021-12-02 | Discharge: 2021-12-02 | Disposition: A | Discharge status: Home / Self Care | Payer: 59 | Source: Ambulatory Visit | Attending: Acute Care | Admitting: Acute Care

## 2021-12-02 ENCOUNTER — Other Ambulatory Visit: Payer: Self-pay

## 2021-12-02 DIAGNOSIS — D3501 Benign neoplasm of right adrenal gland: Secondary | ICD-10-CM | POA: Insufficient documentation

## 2021-12-02 DIAGNOSIS — Z87891 Personal history of nicotine dependence: Secondary | ICD-10-CM | POA: Insufficient documentation

## 2021-12-02 DIAGNOSIS — I700 Atherosclerosis of aorta: Secondary | ICD-10-CM | POA: Diagnosis not present

## 2021-12-02 DIAGNOSIS — I2510 Atherosclerotic heart disease of native coronary artery without angina pectoris: Secondary | ICD-10-CM | POA: Diagnosis not present

## 2021-12-02 DIAGNOSIS — J439 Emphysema, unspecified: Secondary | ICD-10-CM | POA: Diagnosis not present

## 2021-12-02 DIAGNOSIS — Z122 Encounter for screening for malignant neoplasm of respiratory organs: Secondary | ICD-10-CM | POA: Diagnosis not present

## 2021-12-02 DIAGNOSIS — K760 Fatty (change of) liver, not elsewhere classified: Secondary | ICD-10-CM | POA: Diagnosis not present

## 2021-12-06 ENCOUNTER — Other Ambulatory Visit: Payer: Self-pay | Admitting: Acute Care

## 2021-12-06 DIAGNOSIS — Z87891 Personal history of nicotine dependence: Secondary | ICD-10-CM

## 2021-12-07 ENCOUNTER — Encounter: Payer: Self-pay | Admitting: Nurse Practitioner

## 2021-12-07 ENCOUNTER — Other Ambulatory Visit: Payer: Self-pay

## 2021-12-07 ENCOUNTER — Ambulatory Visit: Payer: 59 | Admitting: Nurse Practitioner

## 2021-12-07 VITALS — BP 127/75 | HR 69 | Ht 62.0 in | Wt 169.4 lb

## 2021-12-07 DIAGNOSIS — E1165 Type 2 diabetes mellitus with hyperglycemia: Secondary | ICD-10-CM

## 2021-12-07 DIAGNOSIS — E782 Mixed hyperlipidemia: Secondary | ICD-10-CM

## 2021-12-07 DIAGNOSIS — I10 Essential (primary) hypertension: Secondary | ICD-10-CM | POA: Diagnosis not present

## 2021-12-07 DIAGNOSIS — Z789 Other specified health status: Secondary | ICD-10-CM

## 2021-12-07 DIAGNOSIS — E559 Vitamin D deficiency, unspecified: Secondary | ICD-10-CM | POA: Diagnosis not present

## 2021-12-07 LAB — POCT GLYCOSYLATED HEMOGLOBIN (HGB A1C): HbA1c, POC (controlled diabetic range): 6.3 % (ref 0.0–7.0)

## 2021-12-07 NOTE — Patient Instructions (Signed)

## 2021-12-07 NOTE — Progress Notes (Signed)
12/07/2021, 8:51 AM                                             Endocrinology follow-up note   Subjective:    Patient ID: Alicia Neal, female    DOB: [birth year].  Alicia Neal is being seen in follow-up for management of currently uncontrolled symptomatic diabetes, hyperlipidemia. PMD:   Fayrene Helper, MD.   Past Medical History:  Diagnosis Date   Adrenal adenoma    right side   Allergic rhinitis    Allergy    cat,dog,animal dander   Anxiety disorder    Carpal tunnel syndrome of left wrist    had surgery to repair   Chronic depression    Dr. Cheryln Manly   GERD (gastroesophageal reflux disease)    History of abnormal cervical Pap smear    History of adenomatous polyp of colon    History of hiatal hernia    Hyperlipidemia    Hypertension    IBS (irritable bowel syndrome)    OSA (obstructive sleep apnea)    per pt study yrs ago-- CPAP intolerant    Pemphigoid, cicatricial    pruritic autoimmune blistering skin disorder   PONV (postoperative nausea and vomiting)    Pulmonary nodule, right pulmologist-  dr Nathaneil Canary mcquaid   x2    per CT 09-11-2015   Trigger finger, left    ring finger   Type 2 diabetes mellitus ([place])    Uncontrolled type 2 diabetes mellitus with hyperglycemia ([place]) 04/17/2018   Wears glasses    Past Surgical History:  Procedure Laterality Date   CARDIOVASCULAR STRESS TEST  04-30-2003   Small focus of decreased perfusion on rest images in the mid-distal anterior wall (worrisome for ischemia),  normal LV function and wall motion , ef 60%   CARPAL TUNNEL RELEASE Left 11/26/2015   Procedure: CARPAL TUNNEL RELEASE;  Surgeon: Iran Planas, MD;  Location: [place];  Service: Orthopedics;  Laterality: Left;   CHOLECYSTECTOMY  1989   COLONOSCOPY  11/04/2009   POLYPECTOMY     TRANSTHORACIC ECHOCARDIOGRAM  04-30-2003   mild hypokinetic in the inferior and posterior wall, ef 50%/  trivial MR /  mild TR   TRIGGER FINGER  RELEASE Left 11/26/2015   Procedure: RELEASE TRIGGER FINGER/A-1 PULLEY LEFT RING  FINGER;  Surgeon: Iran Planas, MD;  Location: [place];  Service: Orthopedics;  Laterality: Left;   TUBAL LIGATION  2000   Social History   Socioeconomic History   Marital status: Single    Spouse name: Not on file   Number of children: 1   Years of education: Not on file   Highest education level: Not on file  Occupational History   Not on file  Tobacco Use   Smoking status: Former    Packs/day: 1.00    Years: 40.00    Pack years: 40.00    Types: Cigarettes    Quit date: 11/07/2013    Years since quitting: 8.0   Smokeless tobacco: Never  Vaping Use   Vaping Use: Never used  Substance and Sexual Activity   Alcohol use: Yes    Alcohol/week: 0.0 standard drinks    Comment: occasional   Drug use: No   Sexual activity: Not Currently    Birth control/protection: Post-menopausal, Surgical  Comment: tubal  Other Topics Concern   Not on file  Social History Narrative   Not on file   Social Determinants of Health   Financial Resource Strain: Low Risk    Difficulty of Paying Living Expenses: Not hard at all  Food Insecurity: No Food Insecurity   Worried About Charity fundraiser in the Last Year: Never true   [place] in the Last Year: Never true  Transportation Needs: No Transportation Needs   Lack of Transportation (Medical): No   Lack of Transportation (Non-Medical): No  Physical Activity: Sufficiently Active   Days of Exercise per Week: 4 days   Minutes of Exercise per Session: 40 min  Stress: Stress Concern Present   Feeling of Stress : To some extent  Social Connections: Socially Isolated   Frequency of Communication with Friends and Family: Three times a week   Frequency of Social Gatherings with Friends and Family: Twice a week   Attends Religious Services: Never   Marine scientist or Organizations: No   Attends Archivist Meetings: Never   Marital Status:  Divorced   Outpatient Encounter Medications as of 12/07/2021  Medication Sig   amLODipine (NORVASC) 10 MG tablet TAKE 1 TABLET BY MOUTH DAILY   blood glucose meter kit and supplies Dispense Freestyle meter and supplies for once daily testing dx e11.9   Cholecalciferol (VITAMIN D) 125 MCG (5000 UT) CAPS TAKE 1 CAPSULE BY MOUTH ONCE A DAY   citalopram (CELEXA) 20 MG tablet TAKE 1 TABLET (20 MG TOTAL) BY MOUTH DAILY.   Evolocumab (REPATHA SURECLICK) 462 MG/ML SOAJ INJECT 140 MG INTO THE SKIN EVERY 14 DAYS   glucose blood test strip Use to check blood sugar twice daily.   Accu-Chek Guide Test Strip   ibuprofen (ADVIL) 200 MG tablet Take 800 mg by mouth as needed.   loratadine (CLARITIN) 10 MG tablet Take 10 mg by mouth as needed.   losartan (COZAAR) 100 MG tablet TAKE 1 TABLET BY MOUTH ONCE DAILY   metFORMIN (GLUCOPHAGE) 1000 MG tablet TAKE 1 TABLET BY MOUTH TWICE DAILY WITH MEALS.   pantoprazole (PROTONIX) 40 MG tablet TAKE 1 TABLET BY MOUTH ONCE DAILY   tirzepatide (MOUNJARO) 5 MG/0.5ML Pen Inject 5 mg into the skin once a week.   No facility-administered encounter medications on file as of 12/07/2021.    ALLERGIES: Allergies  Allergen Reactions   Ezetimibe-Simvastatin Other (See Comments)    REACTION: muscle aches   Pravachol [Pravastatin Sodium] Other (See Comments)    Joint pains and memory loss   Simvastatin Other (See Comments)    REACTION: muscle aches   Statins    Zetia [Ezetimibe] Other (See Comments)    Generalized joint pains    VACCINATION STATUS: Immunization History  Administered Date(s) Administered   Influenza Split 07/19/2013, 07/22/2014   Influenza-Unspecified 07/28/2020, 08/12/2021   Moderna Sars-Covid-2 Vaccination 11/05/2019, 11/13/2019, 09/10/2020   Pneumococcal Conjugate-13 06/25/2014   Pneumococcal Polysaccharide-23 07/29/2019   Tdap 06/25/2014   Zoster Recombinat (Shingrix) 06/12/2018    Diabetes She presents for her follow-up diabetic visit. She has  type 2 diabetes mellitus. Onset time: She was diagnosed at approximate age of 61 years. Her disease course has been improving. Pertinent negatives for hypoglycemia include no confusion, headaches, hunger, pallor or seizures. Pertinent negatives for diabetes include no chest pain, no fatigue, no polydipsia, no polyphagia, no polyuria and no weight loss. There are no hypoglycemic complications. Symptoms are stable. There are no diabetic  complications. Risk factors for coronary artery disease include diabetes mellitus, dyslipidemia, hypertension, tobacco exposure and post-menopausal. Current diabetic treatment includes oral agent (monotherapy) (and Mounjaro). She is compliant with treatment all of the time. Her weight is decreasing rapidly. She is following a generally healthy diet. When asked about meal planning, she reported none. She has not had a previous visit with a dietitian. She participates in exercise intermittently. (She presents today with no meter or logs to review (was not asked to routinely monitor given her safe medication regimen of Mounjaro and Metformin).  She states her sugar cravings have completely disappeared and she no longer thinks about food as much.  She has lost 18+ lbs since last visit and feels amazing.  She denies any s/s of hypoglycemia.) An ACE inhibitor/angiotensin II receptor blocker is being taken. She does not see a podiatrist.Eye exam is current.  Hyperlipidemia This is a chronic problem. The current episode started more than 1 year ago. The problem is controlled. Recent lipid tests were reviewed and are normal. Exacerbating diseases include diabetes. There are no known factors aggravating her hyperlipidemia. Pertinent negatives include no chest pain, myalgias or shortness of breath. Treatments tried: Patient is not tolerating statins including Livalo.  She is on Repatha. The current treatment provides moderate improvement of lipids. There are no compliance problems.  Risk  factors for coronary artery disease include dyslipidemia, diabetes mellitus, hypertension and post-menopausal.  Hypertension This is a chronic problem. The current episode started more than 1 year ago. The problem has been resolved since onset. The problem is controlled. Pertinent negatives include no chest pain, headaches, palpitations or shortness of breath. There are no associated agents to hypertension. Risk factors for coronary artery disease include dyslipidemia, diabetes mellitus and smoking/tobacco exposure. Past treatments include calcium channel blockers and angiotensin blockers. The current treatment provides moderate improvement. There are no compliance problems.  Identifiable causes of hypertension include sleep apnea.    Review of systems  Constitutional: + rapidly decreasing body weight,  current Body mass index is 30.98 kg/m. , no fatigue, no subjective hyperthermia, no subjective hypothermia Eyes: no blurry vision, no xerophthalmia ENT: no sore throat, no nodules palpated in throat, no dysphagia/odynophagia, no hoarseness Cardiovascular: no chest pain, no shortness of breath, no palpitations, no leg swelling Respiratory: no cough, no shortness of breath Gastrointestinal: no nausea/vomiting/diarrhea Musculoskeletal: no muscle/joint aches Skin: no rashes, no hyperemia Neurological: no tremors, no numbness, no tingling, no dizziness Psychiatric: no depression, no anxiety   Objective:    BP 127/75    Pulse 69    Ht $R'5\' 2"'mU$  (1.575 m)    Wt 169 lb 6.4 oz (76.8 kg)    SpO2 99%    BMI 30.98 kg/m   Wt Readings from Last 3 Encounters:  12/07/21 169 lb 6.4 oz (76.8 kg)  08/24/21 187 lb (84.8 kg)  08/05/21 190 lb (86.2 kg)   BP Readings from Last 3 Encounters:  12/07/21 127/75  08/24/21 133/82  08/05/21 (!) 140/91    Physical Exam- Limited  Constitutional:  Body mass index is 30.98 kg/m. , not in acute distress, normal state of mind Eyes:  EOMI, no exophthalmos Neck:  Supple Cardiovascular: RRR, no murmurs, rubs, or gallops, no edema Respiratory: Adequate breathing efforts, no crackles, rales, rhonchi, or wheezing Musculoskeletal: no gross deformities, strength intact in all four extremities, no gross restriction of joint movements Skin:  no rashes, no hyperemia Neurological: no tremor with outstretched hands    CMP     Component  Value Date/Time   NA 131 (L) 08/04/2021 0908   K 4.3 08/04/2021 0908   CL 96 (L) 08/04/2021 0908   CO2 25 08/04/2021 0908   GLUCOSE 223 (H) 08/04/2021 0908   BUN 15 08/04/2021 0908   CREATININE 0.58 08/04/2021 0908   CREATININE 0.84 11/18/2016 0844   CALCIUM 8.8 (L) 08/04/2021 0908   PROT 7.4 08/04/2021 0908   ALBUMIN 4.0 08/04/2021 0908   AST 31 08/04/2021 0908   ALT 40 08/04/2021 0908   ALKPHOS 77 08/04/2021 0908   BILITOT 0.4 08/04/2021 0908   GFRNONAA >60 08/04/2021 0908   GFRNONAA 77 11/18/2016 0844   GFRAA >60 08/03/2020 0939   GFRAA 89 11/18/2016 0844    Diabetic Labs (most recent): Lab Results  Component Value Date   HGBA1C 6.3 12/07/2021   HGBA1C 9.4 (A) 08/05/2021   HGBA1C 7.5 (H) 01/25/2021     Lipid Panel ( most recent) Lipid Panel     Component Value Date/Time   CHOL 129 08/04/2021 0908   TRIG 152 (H) 08/04/2021 0908   HDL 54 08/04/2021 0908   CHOLHDL 2.4 08/04/2021 0908   VLDL 30 08/04/2021 0908   LDLCALC 45 08/04/2021 0908      Lab Results  Component Value Date   TSH 2.286 08/04/2021   TSH 2.047 01/25/2021   TSH 1.827 08/03/2020   TSH 1.483 08/01/2019   TSH 1.601 06/08/2018   TSH 1.724 06/08/2017   TSH 1.620 06/08/2015   TSH 1.739 06/23/2014   TSH 1.659 04/15/2013   TSH 1.634 01/11/2012   FREET4 0.97 08/04/2021   FREET4 0.92 06/08/2018      Assessment & Plan:   1) Uncontrolled type 2 diabetes mellitus with hyperglycemia ([place])  - Cecil H Daluz has currently uncontrolled symptomatic type 2 DM since 61 years of age.  She presents today with no meter or logs to  review (was not asked to routinely monitor given her safe medication regimen of Mounjaro and Metformin).  She states her sugar cravings have completely disappeared and she no longer thinks about food as much.  She has lost 18+ lbs since last visit and feels amazing.  She denies any s/s of hypoglycemia.  -She does not report any gross complications from her diabetes, however she remains at a high risk for acute and chronic complications of type 2 diabetes which include CAD, CVA, CKD, retinopathy, and neuropathy. These are all discussed in l with the patient.  - Nutritional counseling repeated at each appointment due to patients tendency to fall back in to old habits.  - The patient admits there is a room for improvement in their diet and drink choices. -  Suggestion is made for the patient to avoid simple carbohydrates from their diet including Cakes, Sweet Desserts / Pastries, Ice Cream, Soda (diet and regular), Sweet Tea, Candies, Chips, Cookies, Sweet Pastries, Store Bought Juices, Alcohol in Excess of 1-2 drinks a day, Artificial Sweeteners, Coffee Creamer, and "Sugar-free" Products. This will help patient to have stable blood glucose profile and potentially avoid unintended weight gain.   - I encouraged the patient to switch to unprocessed or minimally processed complex starch and increased protein intake (animal or plant source), fruits, and vegetables.   - Patient is advised to stick to a routine mealtimes to eat 3 meals a day and avoid unnecessary snacks (to snack only to correct hypoglycemia).  - she has seen Jearld Fenton, RDN, CDE for individualized diabetes education.  - I have approached her with the  following individualized plan to manage diabetes and patient agrees:   -Given her drastic improvement, she is advised to continue Metformin 1000 mg po twice daily with meals and Mounjaro 5 mg SQ weekly.  May start lowering dose of Metformin on subsequent visits if she continues with this  progress.   -She does not need to routinely monitor glucose given her current, safe medication regimen.  - Patient specific target  A1c;  LDL, HDL, Triglycerides, were discussed in detail.  2) BP/HTN:  Her blood pressure is controlled to target.  She is advised to continue Norvasc 10 mg po daily and Losartan 100 mg po daily.  3) Lipids/HPL:  Her most recent lipid panel from 08/04/21 shows controlled LDL of 45- improving since starting Repatha.  She is advised to continue Repatha 140 mg SQ every other week.  She is intolerant to statins.  4)  Weight/Diet:  Her Body mass index is 30.98 kg/m.  She would benefit from more weight loss.  CDE Consult has been  initiated , exercise, and detailed carbohydrates information provided.  5) Chronic Care/Health Maintenance: -she is on ACEI/ARB medications and is encouraged to continue to follow up with Ophthalmology, Dentist, Podiatrist at least yearly or according to recommendations, and advised to stay away from smoking. I have recommended yearly flu vaccine and pneumonia vaccination at least every 5 years; moderate intensity exercise for up to 150 minutes weekly; and  sleep for at least 7 hours a day.  - I advised patient to maintain close follow up with Fayrene Helper, MD for primary care needs.     I spent 30 minutes in the care of the patient today including review of labs from [facility], Lipids, Thyroid Function, Hematology (current and previous including abstractions from other facilities); face-to-face time discussing  her blood glucose readings/logs, discussing hypoglycemia and hyperglycemia episodes and symptoms, medications doses, her options of short and long term treatment based on the latest standards of care / guidelines;  discussion about incorporating lifestyle medicine;  and documenting the encounter.    Please refer to Patient Instructions for Blood Glucose Monitoring and Insulin/Medications Dosing Guide"  in media tab for additional  information. Please  also refer to " Patient Self Inventory" in the Media  tab for reviewed elements of pertinent patient history.  MARSIA CINO participated in the discussions, expressed understanding, and voiced agreement with the above plans.  All questions were answered to her satisfaction. she is encouraged to contact clinic should she have any questions or concerns prior to her return visit.    Follow up plan: - Return in about 4 months (around 04/06/2022) for Diabetes F/U with A1c in office, No previsit labs.  Rayetta Pigg, [facility] [address] [place],  [address] Phone: [phone] Fax: [phone]  12/07/2021, 8:51 AM

## 2021-12-13 ENCOUNTER — Other Ambulatory Visit (HOSPITAL_COMMUNITY): Payer: Self-pay

## 2021-12-13 ENCOUNTER — Other Ambulatory Visit: Payer: Self-pay | Admitting: Nurse Practitioner

## 2021-12-13 DIAGNOSIS — I10 Essential (primary) hypertension: Secondary | ICD-10-CM

## 2021-12-13 MED ORDER — AMLODIPINE BESYLATE 10 MG PO TABS
ORAL_TABLET | Freq: Every day | ORAL | 1 refills | Status: DC
  Filled 2021-12-13: qty 90, 90d supply, fill #0
  Filled 2022-03-18: qty 90, 90d supply, fill #1

## 2021-12-28 ENCOUNTER — Ambulatory Visit: Payer: 59 | Admitting: Family Medicine

## 2021-12-28 ENCOUNTER — Other Ambulatory Visit: Payer: Self-pay

## 2021-12-28 ENCOUNTER — Encounter: Payer: Self-pay | Admitting: Family Medicine

## 2021-12-28 VITALS — BP 108/72 | HR 67 | Ht 61.0 in | Wt 165.1 lb

## 2021-12-28 DIAGNOSIS — M25511 Pain in right shoulder: Secondary | ICD-10-CM | POA: Diagnosis not present

## 2021-12-28 DIAGNOSIS — I10 Essential (primary) hypertension: Secondary | ICD-10-CM | POA: Diagnosis not present

## 2021-12-28 DIAGNOSIS — E559 Vitamin D deficiency, unspecified: Secondary | ICD-10-CM

## 2021-12-28 DIAGNOSIS — E1159 Type 2 diabetes mellitus with other circulatory complications: Secondary | ICD-10-CM | POA: Diagnosis not present

## 2021-12-28 DIAGNOSIS — G8929 Other chronic pain: Secondary | ICD-10-CM | POA: Diagnosis not present

## 2021-12-28 DIAGNOSIS — E669 Obesity, unspecified: Secondary | ICD-10-CM

## 2021-12-28 DIAGNOSIS — E785 Hyperlipidemia, unspecified: Secondary | ICD-10-CM

## 2021-12-28 DIAGNOSIS — Z1231 Encounter for screening mammogram for malignant neoplasm of breast: Secondary | ICD-10-CM

## 2021-12-28 NOTE — Assessment & Plan Note (Signed)
Persistent right shopulder pain up tp a 7 at night, neurosurgery states neck not the problem, refer Ortho

## 2021-12-28 NOTE — Patient Instructions (Signed)
Annual exam in September, call if you need me sooner  Please schedule mammogram at checkout  CONGRATS on improved health!!!  It is important that you exercise regularly at least 30 minutes 5 times a week. If you develop chest pain, have severe difficulty breathing, or feel very tired, stop exercising immediately and seek medical attention   Labs today as ordered  You are referred to Orthopedics re right shoulder  Thanks for choosing [facility], we consider it a privelige to serve you.

## 2021-12-29 ENCOUNTER — Encounter: Payer: Self-pay | Admitting: Orthopedic Surgery

## 2021-12-29 ENCOUNTER — Ambulatory Visit: Payer: 59 | Admitting: Orthopedic Surgery

## 2021-12-29 ENCOUNTER — Ambulatory Visit: Payer: 59

## 2021-12-29 VITALS — BP 138/90 | HR 72 | Ht 61.0 in | Wt 166.0 lb

## 2021-12-29 DIAGNOSIS — M778 Other enthesopathies, not elsewhere classified: Secondary | ICD-10-CM | POA: Diagnosis not present

## 2021-12-29 DIAGNOSIS — G8929 Other chronic pain: Secondary | ICD-10-CM

## 2021-12-29 DIAGNOSIS — M25511 Pain in right shoulder: Secondary | ICD-10-CM

## 2021-12-29 LAB — VITAMIN D 25 HYDROXY (VIT D DEFICIENCY, FRACTURES): Vit D, 25-Hydroxy: 94.6 ng/mL (ref 30.0–100.0)

## 2021-12-29 LAB — LIPID PANEL
Chol/HDL Ratio: 3.1 ratio (ref 0.0–4.4)
Cholesterol, Total: 177 mg/dL (ref 100–199)
HDL: 57 mg/dL (ref >39)
LDL Chol Calc (NIH): 96 mg/dL (ref 0–99)
Triglycerides: 139 mg/dL (ref 0–149)
VLDL Cholesterol Cal: 24 mg/dL (ref 5–40)

## 2021-12-29 LAB — CMP14+EGFR
ALT: 18 IU/L (ref 0–32)
AST: 18 IU/L (ref 0–40)
Albumin/Globulin Ratio: 2.2 (ref 1.2–2.2)
Albumin: 4.9 g/dL — ABNORMAL HIGH (ref 3.8–4.8)
Alkaline Phosphatase: 75 IU/L (ref 44–121)
BUN/Creatinine Ratio: 17 (ref 12–28)
BUN: 13 mg/dL (ref 8–27)
Bilirubin Total: 0.3 mg/dL (ref 0.0–1.2)
CO2: 25 mmol/L (ref 20–29)
Calcium: 9.5 mg/dL (ref 8.7–10.3)
Chloride: 96 mmol/L (ref 96–106)
Creatinine, Ser: 0.75 mg/dL (ref 0.57–1.00)
Globulin, Total: 2.2 g/dL (ref 1.5–4.5)
Glucose: 99 mg/dL (ref 70–99)
Potassium: 4.3 mmol/L (ref 3.5–5.2)
Sodium: 137 mmol/L (ref 134–144)
Total Protein: 7.1 g/dL (ref 6.0–8.5)
eGFR: 90 mL/min/{1.73_m2} (ref >59)

## 2021-12-29 LAB — CBC
Hematocrit: 41.5 % (ref 34.0–46.6)
Hemoglobin: 13.4 g/dL (ref 11.1–15.9)
MCH: 28.8 pg (ref 26.6–33.0)
MCHC: 32.3 g/dL (ref 31.5–35.7)
MCV: 89 fL (ref 79–97)
Platelets: 439 10*3/uL (ref 150–450)
RBC: 4.65 x10E6/uL (ref 3.77–5.28)
RDW: 13.5 % (ref 11.7–15.4)
WBC: 9.2 10*3/uL (ref 3.4–10.8)

## 2021-12-29 NOTE — Patient Instructions (Signed)

## 2021-12-30 ENCOUNTER — Encounter: Payer: Self-pay | Admitting: Orthopedic Surgery

## 2021-12-30 NOTE — Progress Notes (Signed)
New Patient Visit  Assessment: Alicia Neal is a 61 y.o. female with the following: 1. Right shoulder tendinitis  Plan: Pain is in the right shoulder.  Pain gets worse at night.  She has difficulty with overhead motion, but her overall range of motion and strength is pretty good.  Radiographs are without concerning findings.  At this point, she most likely has some tendinitis of the rotator cuff.  We discussed multiple treatment options, but she is not interested in an injection.  We discussed physical therapy versus home exercise program, and she would like to do the exercises on her own.  She can continue with medications as needed.  If her symptoms worsen, I would recommend proceeding with an injection.  All questions were answered and she is amenable to this plan.   Follow-up: Return if symptoms worsen or fail to improve.  Subjective:  Chief Complaint  Patient presents with   Shoulder Pain    Rt shoulder for a yr, has been seen by neurology and does have a bulging disc that doesn't need treatment.     History of Present Illness: Alicia Neal is a 61 y.o. female who has been referred to clinic today by Tula Nakayama, MD for evaluation of right shoulder pain.  She has had pain in the anterior and lateral aspect of the right shoulder for the past year.  Atraumatic onset.  Her pain gets worse at nighttime.  She has never had an injection.  She has not worked with physical therapy.  She has been worked up for neck pain, and an MRI demonstrated some potential stenosis and compression.  She has been evaluated by neurosurgery, and no procedures are warranted at this time.  She has noticed some decreased function of the right arm in the past, as well as some numbness and tingling.  No current numbness and tingling.   Review of Systems: No fevers or chills No numbness or tingling No chest pain No shortness of breath No bowel or bladder dysfunction No GI distress No  headaches   Medical History:  Past Medical History:  Diagnosis Date   Adrenal adenoma    right side   Allergic rhinitis    Allergy    cat,dog,animal dander   Anxiety disorder    Carpal tunnel syndrome of left wrist    had surgery to repair   Chronic depression    Dr. Cheryln Manly   GERD (gastroesophageal reflux disease)    History of abnormal cervical Pap smear    History of adenomatous polyp of colon    History of hiatal hernia    Hyperlipidemia    Hypertension    IBS (irritable bowel syndrome)    OSA (obstructive sleep apnea)    per pt study yrs ago-- CPAP intolerant    Pemphigoid, cicatricial    pruritic autoimmune blistering skin disorder   PONV (postoperative nausea and vomiting)    Pulmonary nodule, right pulmologist-  dr Nathaneil Canary mcquaid   x2    per CT 09-11-2015   Trigger finger, left    ring finger   Type 2 diabetes mellitus ([place])    Uncontrolled type 2 diabetes mellitus with hyperglycemia ([place]) 04/17/2018   Wears glasses     Past Surgical History:  Procedure Laterality Date   CARDIOVASCULAR STRESS TEST  04-30-2003   Small focus of decreased perfusion on rest images in the mid-distal anterior wall (worrisome for ischemia),  normal LV function and wall motion , ef 60%   CARPAL  TUNNEL RELEASE Left 11/26/2015   Procedure: CARPAL TUNNEL RELEASE;  Surgeon: Iran Planas, MD;  Location: [place];  Service: Orthopedics;  Laterality: Left;   CHOLECYSTECTOMY  1989   COLONOSCOPY  11/04/2009   POLYPECTOMY     TRANSTHORACIC ECHOCARDIOGRAM  04-30-2003   mild hypokinetic in the inferior and posterior wall, ef 50%/  trivial MR /  mild TR   TRIGGER FINGER RELEASE Left 11/26/2015   Procedure: RELEASE TRIGGER FINGER/A-1 PULLEY LEFT RING  FINGER;  Surgeon: Iran Planas, MD;  Location: [place];  Service: Orthopedics;  Laterality: Left;   TUBAL LIGATION  2000    Family History  Problem Relation Age of Onset   Lung cancer Father    Diabetes Father    Cancer Father        lung    Arthritis Mother    Cancer Brother        skin   Cancer Maternal Grandmother        colon and vaginal   Cancer Maternal Grandfather        colon   COPD Maternal Grandfather    Hypertension Paternal Grandmother    Stroke Paternal Grandmother    Colon cancer Neg Hx    Colon polyps Neg Hx    Esophageal cancer Neg Hx    Rectal cancer Neg Hx    Stomach cancer Neg Hx    Social History   Tobacco Use   Smoking status: Former    Packs/day: 1.00    Years: 40.00    Pack years: 40.00    Types: Cigarettes    Quit date: 11/07/2013    Years since quitting: 8.1   Smokeless tobacco: Never  Vaping Use   Vaping Use: Never used  Substance Use Topics   Alcohol use: Yes    Alcohol/week: 0.0 standard drinks    Comment: occasional   Drug use: No    Allergies  Allergen Reactions   Ezetimibe-Simvastatin Other (See Comments)    REACTION: muscle aches   Pravachol [Pravastatin Sodium] Other (See Comments)    Joint pains and memory loss   Simvastatin Other (See Comments)    REACTION: muscle aches   Statins    Zetia [Ezetimibe] Other (See Comments)    Generalized joint pains    No outpatient medications have been marked as taking for the 12/29/21 encounter (Office Visit) with Mordecai Rasmussen, MD.    Objective: BP 138/90    Pulse 72    Ht 5\' 1"  (1.549 m)    Wt 166 lb (75.3 kg)    BMI 31.37 kg/m   Physical Exam:  General: Alert and oriented. and No acute distress. Gait: Normal gait.  Evaluation of the right shoulder demonstrates no deformity.  No atrophy is appreciated.  160 degrees of forward flexion, with some pain in the posterior lateral aspect of the right shoulder.  Internal rotation to her lumbar spine.  Active external rotation to 45 degrees.  Mild discomfort in the empty can testing position.  Pain in the posterior shoulder with speeds testing.  Fingers are warm and well-perfused.  IMAGING: I personally ordered and reviewed the following images   X-rays of the right shoulder  were obtained in clinic today.  Glenohumeral joint space is maintained.  The shoulder joint is reduced.  No evidence of proximal humeral migration.  No acute injuries are noted.  Mild degenerative changes at the [facility] joint.  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     Mordecai Rasmussen, MD  12/30/2021 8:44 AM

## 2022-01-03 ENCOUNTER — Other Ambulatory Visit (HOSPITAL_COMMUNITY): Payer: Self-pay

## 2022-01-03 ENCOUNTER — Encounter: Payer: Self-pay | Admitting: Family Medicine

## 2022-01-03 NOTE — Assessment & Plan Note (Signed)
Marked improvement  Patient re-educated about  the importance of commitment to a  minimum of 150 minutes of exercise per week as able.  The importance of healthy food choices with portion control discussed, as well as eating regularly and within a 12 hour window most days. The need to choose "clean , green" food 50 to 75% of the time is discussed, as well as to make water the primary drink and set a goal of 64 ounces water daily.    Weight /BMI 12/29/2021 12/28/2021 12/07/2021  WEIGHT 166 lb 165 lb 1.9 oz 169 lb 6.4 oz  HEIGHT 5\' 1"  5\' 1"  5\' 2"   BMI 31.37 kg/m2 31.2 kg/m2 30.98 kg/m2

## 2022-01-03 NOTE — Assessment & Plan Note (Signed)
Normal range, no change in management

## 2022-01-03 NOTE — Progress Notes (Signed)
Alicia Neal     MRN: [id number]      DOB: 09/03/1961   HPI Alicia Neal is here for follow up and re-evaluation of chronic medical conditions, medication management and review of any available recent lab and radiology data.  Preventive health is updated, specifically  Cancer screening and Immunization.   Questions or concerns regarding consultations or procedures which the PT has had in the interim are  addressed.Neurosurgery eval reported that neck was not the cause of her right arm and shoulder pain, which perissts, and disturbs her sleep will obtain ortho eval The PT denies any adverse reactions to current medications since the last visit. She has done exceptionally well on mounjaro as far as blood sugar control and weight loss Continues regular exercise routine Denies polyuria, polydipsia, blurred vision , or hypoglycemic episodes.  ROS Denies recent fever or chills. Denies sinus pressure, nasal congestion, ear pain or sore throat. Denies chest congestion, productive cough or wheezing. Denies chest pains, palpitations and leg swelling Denies abdominal pain, nausea, vomiting,diarrhea or constipation.   Denies dysuria, frequency, hesitancy or incontinence. . Denies headaches, seizures, numbness, or tingling. Denies depression, anxiety or insomnia. Denies skin break down or rash.   PE  BP 108/72 (BP Location: Left Arm, Patient Position: Sitting, Cuff Size: Large)    Pulse 67    Ht 5\' 1"  (1.549 m)    Wt 165 lb 1.9 oz (74.9 kg)    SpO2 96%    BMI 31.20 kg/m   Patient alert and oriented and in no cardiopulmonary distress.  HEENT: No facial asymmetry, EOMI,     Neck supple .  Chest: Clear to auscultation bilaterally.  CVS: S1, S2 no murmurs, no S3.Regular rate.  ABD: Soft non tender.   Ext: No edema  MS: Adequate ROM spine, , hips and knees.decreased in right shoulder  Skin: Intact, no ulcerations or rash noted.  Psych: Good eye contact, normal affect. Memory intact not  anxious or depressed appearing.  CNS: CN 2-12 intact, power,  normal throughout.no focal deficits noted.   Assessment & Plan  Right shoulder pain Persistent right shopulder pain up tp a 7 at night, neurosurgery states neck not the problem, refer Ortho  Type 2 diabetes mellitus with vascular disease ([place]) Managed by Endo and well controlled Alicia Neal is reminded of the importance of commitment to daily physical activity for 30 minutes or more, as able and the need to limit carbohydrate intake to 30 to 60 grams per meal to help with blood sugar control.   The need to take medication as prescribed, test blood sugar as directed, and to call between visits if there is a concern that blood sugar is uncontrolled is also discussed.   Alicia Neal is reminded of the importance of daily foot exam, annual eye examination, and good blood sugar, blood pressure and cholesterol control.  Diabetic Labs Latest Ref Rng & Units 12/28/2021 12/07/2021 08/05/2021 08/04/2021 01/25/2021  HbA1c 0.0 - 7.0 % - 6.3 9.4(A) - 7.5(H)  Microalbumin Not Estab. ug/mL - - - - <3.0(H)  Micro/Creat Ratio 0 - 29 mg/g creat - - - - <5  Chol 100 - 199 mg/dL 177 - - 129 165  HDL >39 mg/dL 57 - - 54 65  Calc LDL 0 - 99 mg/dL 96 - - 45 76  Triglycerides 0 - 149 mg/dL 139 - - 152(H) 121  Creatinine 0.57 - 1.00 mg/dL 0.75 - - 0.58 0.65   BP/Weight 12/29/2021 12/28/2021 12/07/2021 08/24/2021  08/05/2021 2/57/4935 03/08/1746  Systolic BP 159 539 672 897 915 041 364  Diastolic BP 90 72 75 82 91 76 87  Wt. (Lbs) 166 165.12 169.4 187 190 192 194  BMI 31.37 31.2 30.98 34.2 34.2 34.84 35.2   Foot/eye exam completion dates Latest Ref Rng & Units 08/24/2021 03/15/2021  Eye Exam No Retinopathy - No Retinopathy  Foot Form Completion - Done -        Hyperlipidemia LDL goal <100 Hyperlipidemia:Low fat diet discussed and encouraged.   Lipid Panel  Lab Results  Component Value Date   CHOL 177 12/28/2021   HDL 57 12/28/2021   LDLCALC 96  12/28/2021   TRIG 139 12/28/2021   CHOLHDL 3.1 12/28/2021     Controlled, no change in medication   Mildly obese Marked improvement  Patient re-educated about  the importance of commitment to a  minimum of 150 minutes of exercise per week as able.  The importance of healthy food choices with portion control discussed, as well as eating regularly and within a 12 hour window most days. The need to choose "clean , green" food 50 to 75% of the time is discussed, as well as to make water the primary drink and set a goal of 64 ounces water daily.    Weight /BMI 12/29/2021 12/28/2021 12/07/2021  WEIGHT 166 lb 165 lb 1.9 oz 169 lb 6.4 oz  HEIGHT 5\' 1"  5\' 1"  5\' 2"   BMI 31.37 kg/m2 31.2 kg/m2 30.98 kg/m2      Vitamin D deficiency Normal range, no change in management  HTN, goal below 130/80 DASH diet and commitment to daily physical activity for a minimum of 30 minutes discussed and encouraged, as a part of hypertension management. The importance of attaining a healthy weight is also discussed.  BP/Weight 12/29/2021 12/28/2021 12/07/2021 08/24/2021 08/05/2021 3/83/7793 07/14/8863  Systolic BP 847 207 218 288 337 445 146  Diastolic BP 90 72 75 82 91 76 87  Wt. (Lbs) 166 165.12 169.4 187 190 192 194  BMI 31.37 31.2 30.98 34.2 34.2 34.84 35.2

## 2022-01-03 NOTE — Assessment & Plan Note (Signed)
Hyperlipidemia:Low fat diet discussed and encouraged.   Lipid Panel  Lab Results  Component Value Date   CHOL 177 12/28/2021   HDL 57 12/28/2021   LDLCALC 96 12/28/2021   TRIG 139 12/28/2021   CHOLHDL 3.1 12/28/2021     Controlled, no change in medication

## 2022-01-03 NOTE — Assessment & Plan Note (Signed)
DASH diet and commitment to daily physical activity for a minimum of 30 minutes discussed and encouraged, as a part of hypertension management. The importance of attaining a healthy weight is also discussed.  BP/Weight 12/29/2021 12/28/2021 12/07/2021 08/24/2021 08/05/2021 02/08/3532 11/13/4097  Systolic BP 278 004 471 580 638 685 488  Diastolic BP 90 72 75 82 91 76 87  Wt. (Lbs) 166 165.12 169.4 187 190 192 194  BMI 31.37 31.2 30.98 34.2 34.2 34.84 35.2

## 2022-01-03 NOTE — Assessment & Plan Note (Signed)
Managed by Endo and well controlled Ms. Iten is reminded of the importance of commitment to daily physical activity for 30 minutes or more, as able and the need to limit carbohydrate intake to 30 to 60 grams per meal to help with blood sugar control.   The need to take medication as prescribed, test blood sugar as directed, and to call between visits if there is a concern that blood sugar is uncontrolled is also discussed.   Ms. Sergent is reminded of the importance of daily foot exam, annual eye examination, and good blood sugar, blood pressure and cholesterol control.  Diabetic Labs Latest Ref Rng & Units 12/28/2021 12/07/2021 08/05/2021 08/04/2021 01/25/2021  HbA1c 0.0 - 7.0 % - 6.3 9.4(A) - 7.5(H)  Microalbumin Not Estab. ug/mL - - - - <3.0(H)  Micro/Creat Ratio 0 - 29 mg/g creat - - - - <5  Chol 100 - 199 mg/dL 177 - - 129 165  HDL >39 mg/dL 57 - - 54 65  Calc LDL 0 - 99 mg/dL 96 - - 45 76  Triglycerides 0 - 149 mg/dL 139 - - 152(H) 121  Creatinine 0.57 - 1.00 mg/dL 0.75 - - 0.58 0.65   BP/Weight 12/29/2021 12/28/2021 12/07/2021 08/24/2021 08/05/2021 03/27/7470 03/16/5395  Systolic BP 728 979 150 413 643 837 793  Diastolic BP 90 72 75 82 91 76 87  Wt. (Lbs) 166 165.12 169.4 187 190 192 194  BMI 31.37 31.2 30.98 34.2 34.2 34.84 35.2   Foot/eye exam completion dates Latest Ref Rng & Units 08/24/2021 03/15/2021  Eye Exam No Retinopathy - No Retinopathy  Foot Form Completion - Done -

## 2022-01-09 ENCOUNTER — Other Ambulatory Visit (HOSPITAL_COMMUNITY): Payer: Self-pay

## 2022-01-10 ENCOUNTER — Other Ambulatory Visit (HOSPITAL_COMMUNITY): Payer: Self-pay

## 2022-01-10 ENCOUNTER — Ambulatory Visit (HOSPITAL_COMMUNITY): Payer: 59

## 2022-01-24 ENCOUNTER — Other Ambulatory Visit (HOSPITAL_COMMUNITY): Payer: Self-pay

## 2022-02-01 ENCOUNTER — Telehealth: Payer: 59 | Admitting: Family Medicine

## 2022-02-01 DIAGNOSIS — H10021 Other mucopurulent conjunctivitis, right eye: Secondary | ICD-10-CM | POA: Diagnosis not present

## 2022-02-01 MED ORDER — POLYMYXIN B-TRIMETHOPRIM 10000-0.1 UNIT/ML-% OP SOLN: 1.0000 [drp] | Freq: Four times a day (QID) | OPHTHALMIC | 0 refills | Status: DC

## 2022-02-01 NOTE — Patient Instructions (Signed)
Bacterial Conjunctivitis, Adult ?Bacterial conjunctivitis is an infection of the clear membrane that covers the white part of the eye and the inner surface of the eyelid (conjunctiva). When the blood vessels in the conjunctiva become inflamed, the eye becomes red or pink. The eye often feels irritated or itchy. Bacterial conjunctivitis spreads easily from person to person (is contagious). It also spreads easily from one eye to the other eye. ?What are the causes? ?This condition is caused by bacteria. You may get the infection if you come into close contact with: ?A person who is infected with the bacteria. ?Items that are contaminated with the bacteria, such as a face towel, contact lens solution, or eye makeup. ?What increases the risk? ?You are more likely to develop this condition if: ?You are exposed to other people who have the infection. ?You wear contact lenses. ?You have a sinus infection. ?You have had a recent eye injury or surgery. ?You have a weak body defense system (immune system). ?You have a medical condition that causes dry eyes. ?What are the signs or symptoms? ?Symptoms of this condition include: ?Thick, yellowish discharge from the eye. This may turn into a crust on the eyelid overnight and cause your eyelids to stick together. ?Tearing or watery eyes. ?Itchy eyes. ?Burning feeling in your eyes. ?Eye redness. ?Swollen eyelids. ?Blurred vision. ?How is this diagnosed? ?This condition is diagnosed based on your symptoms and medical history. Your health care provider may also take a sample of discharge from your eye to find the cause of your infection. ?How is this treated? ?This condition may be treated with: ?Antibiotic eye drops or ointment to clear the infection more quickly and prevent the spread of infection to others. ?Antibiotic medicines taken by mouth (orally) to treat infections that do not respond to drops or ointments or that last longer than 10 days. ?Cool, wet cloths (cool  compresses) placed on the eyes. ?Artificial tears applied 2-6 times a day. ?Follow these instructions at home: ?Medicines ?Take or apply your antibiotic medicine as told by your health care provider. Do not stop using the antibiotic, even if your condition improves, unless directed by your health care provider. ?Take or apply over-the-counter and prescription medicines only as told by your health care provider. ?Be very careful to avoid touching the edge of your eyelid with the eye-drop bottle or the ointment tube when you apply medicines to the affected eye. This will keep you from spreading the infection to your other eye or to other people. ?Managing discomfort ?Gently wipe away any drainage from your eye with a warm, wet washcloth or a cotton ball. ?Apply a clean, cool compress to your eye for 10-20 minutes, 3-4 times a day. ?General instructions ?Do not wear contact lenses until the inflammation is gone and your health care provider says it is safe to wear them again. Ask your health care provider how to sterilize or replace your contact lenses before you use them again. Wear glasses until you can resume wearing contact lenses. ?Avoid wearing eye makeup until the inflammation is gone. Throw away any old eye cosmetics that may be contaminated. ?Change or wash your pillowcase every day. ?Do not share towels or washcloths. This may spread the infection. ?Wash your hands often with soap and water for at least 20 seconds and especially before touching your face or eyes. Use paper towels to dry your hands. ?Avoid touching or rubbing your eyes. ?Do not drive or use heavy machinery if your vision is blurred. ?Contact   a health care provider if: ?You have a fever. ?Your symptoms do not get better after 10 days. ?Get help right away if: ?You have a fever and your symptoms suddenly get worse. ?You have severe pain when you move your eye. ?You have facial pain, redness, or swelling. ?You have a sudden loss of  vision. ?Summary ?Bacterial conjunctivitis is an infection of the clear membrane that covers the white part of the eye and the inner surface of the eyelid (conjunctiva). ?Bacterial conjunctivitis spreads easily from eye to eye and from person to person (is contagious). ?Wash your hands often with soap and water for at least 20 seconds and especially before touching your face or eyes. Use paper towels to dry your hands. ?Take or apply your antibiotic medicine as told by your health care provider. Do not stop using the antibiotic even if your condition improves. ?Contact a health care provider if you have a fever or if your symptoms do not get better after 10 days. Get help right away if you have a sudden loss of vision. ?This information is not intended to replace advice given to you by your health care provider. Make sure you discuss any questions you have with your health care provider. ?Document Revised: 02/03/2021 Document Reviewed: 02/03/2021 ?Elsevier Patient Education ? [place]. ? ?

## 2022-02-01 NOTE — Progress Notes (Signed)
?Virtual Visit Consent  ? ?Alicia Neal, you are scheduled for a virtual visit with a [place] provider today.   ?  ?Just as with appointments in the office, your consent must be obtained to participate.  Your consent will be active for this visit and any virtual visit you may have with one of our providers in the next 365 days.   ?  ?If you have a MyChart account, a copy of this consent can be sent to you electronically.  All virtual visits are billed to your insurance company just like a traditional visit in the office.   ? ?As this is a virtual visit, video technology does not allow for your provider to perform a traditional examination.  This may limit your provider's ability to fully assess your condition.  If your provider identifies any concerns that need to be evaluated in person or the need to arrange testing (such as labs, EKG, etc.), we will make arrangements to do so.   ?  ?Although advances in technology are sophisticated, we cannot ensure that it will always work on either your end or our end.  If the connection with a video visit is poor, the visit may have to be switched to a telephone visit.  With either a video or telephone visit, we are not always able to ensure that we have a secure connection.    ? ?I need to obtain your verbal consent now.   Are you willing to proceed with your visit today?  ?  ?Alicia Neal has provided verbal consent on 02/01/2022 for a virtual visit (video or telephone). ?  ?Alicia Mayo, NP  ? ?Date: 02/01/2022 12:48 PM ? ? ?Virtual Visit via Video Note  ? ?Alicia Neal, connected with  Alicia Neal  ([id number], Feb 10, 1961) on 02/01/22 at 12:45 PM EDT by a video-enabled telemedicine application and verified that I am speaking with the correct person using two identifiers. ? ?Location: ?Patient: Virtual Visit Location Patient: Home ?Provider: Virtual Visit Location Provider: Home Office ?  ?I discussed the limitations of evaluation and management by  telemedicine and the availability of in person appointments. The patient expressed understanding and agreed to proceed.   ? ?History of Present Illness: ?Alicia Neal is a 61 y.o. who identifies as a female who was assigned female at birth, and is being seen today for pink eye- right eye. Onset was yesterday with waking up with it crusted itchy, watery, red. After work she developed a blister on the lower lid. This morning she had puffiness, crusting. Feels like it is very dry. Denies fevers, chills, no URI symptoms.  ?Granddaughter has pink eye and she was around her.  ? ? ?Problems:  ?Patient Active Problem List  ? Diagnosis Date Noted  ? Neck pain on right side 08/24/2021  ? Thoracic spine pain 08/24/2021  ? Right shoulder pain 02/15/2021  ? Mildly obese 05/01/2020  ? Essential hypertension, benign 04/17/2018  ? Abnormal CT scan, chest 03/15/2018  ? 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 GAD (generalized anxiety disorder) 10/25/2015  ? Vitamin D deficiency 06/23/2015  ? Pulmonary nodules 03/18/2014  ? 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 H/O abnormal cervical Papanicolaou smear 04/15/2013  ? B12 deficiency 10/24/2012  ? GERD (gastroesophageal reflux disease) 10/24/2012  ? Sleep apnea 04/23/2012  ? 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 HTN, goal below 130/80 01/12/2012  ? 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 Mixed hyperlipidemia 05/06/2010  ? Type 2 diabetes mellitus with vascular disease (  [place]) 05/02/2010  ? Hyperlipidemia LDL goal <100 05/02/2010  ? Allergic rhinitis 05/02/2010  ?  ?Allergies:  ?Allergies  ?Allergen Reactions  ? Ezetimibe-Simvastatin Other (See Comments)  ?  REACTION: muscle aches  ? Pravachol [Pravastatin Sodium] Other (See Comments)  ?  Joint pains and memory loss  ? Simvastatin Other (See Comments)  ?  REACTION: muscle aches  ? Statins   ? Zetia [Ezetimibe] Other (See Comments)  ?  Generalized joint pains  ? ?Medications:  ?Current Outpatient Medications:  ?  amLODipine (NORVASC) 10 MG tablet, TAKE 1 TABLET BY MOUTH  DAILY, Disp: 90 tablet, Rfl: 1 ?  blood glucose meter kit and supplies, Dispense Freestyle meter and supplies for once daily testing dx e11.9, Disp: 1 each, Rfl: 0 ?  Cholecalciferol (VITAMIN D) 125 MCG (5000 UT) CAPS, TAKE 1 CAPSULE BY MOUTH ONCE A DAY, Disp: 100 capsule, Rfl: 0 ?  citalopram (CELEXA) 20 MG tablet, TAKE 1 TABLET (20 MG TOTAL) BY MOUTH DAILY., Disp: 90 tablet, Rfl: 3 ?  Evolocumab (REPATHA SURECLICK) 144 MG/ML SOAJ, INJECT 140 MG INTO THE SKIN EVERY 14 DAYS, Disp: 2 mL, Rfl: 2 ?  glucose blood test strip, Use to check blood sugar twice daily.   Accu-Chek Guide Test Strip, Disp: 100 each, Rfl: 2 ?  ibuprofen (ADVIL) 200 MG tablet, Take 800 mg by mouth as needed., Disp: , Rfl:  ?  loratadine (CLARITIN) 10 MG tablet, Take 10 mg by mouth as needed., Disp: , Rfl:  ?  losartan (COZAAR) 100 MG tablet, TAKE 1 TABLET BY MOUTH ONCE DAILY, Disp: 90 tablet, Rfl: 3 ?  metFORMIN (GLUCOPHAGE) 1000 MG tablet, TAKE 1 TABLET BY MOUTH TWICE DAILY WITH MEALS., Disp: 180 tablet, Rfl: 0 ?  pantoprazole (PROTONIX) 40 MG tablet, TAKE 1 TABLET BY MOUTH ONCE DAILY, Disp: 90 tablet, Rfl: 3 ?  tirzepatide (MOUNJARO) 5 MG/0.5ML Pen, Inject 5 mg into the skin once a week., Disp: 6 mL, Rfl: 3 ? ?Observations/Objective: ?Patient is well-developed, well-nourished in no acute distress.  ?Resting comfortably  at home.  ?Head is normocephalic, atraumatic.  ?No labored breathing.  ?Speech is clear and coherent with logical content.  ?Patient is alert and oriented at baseline.  ?Right eye redness ? ? ?Assessment and Plan: ? ?1. Pink eye, right ? ?- trimethoprim-polymyxin b (POLYTRIM) ophthalmic solution; Place 1 drop into the right eye in the morning, at noon, in the evening, and at bedtime.  Dispense: 10 mL; Refill: 0 ? ?Pink eye in the setting of known exposure and symptoms. ?Will treat with polytrim ?Education info on AVS and discussed ?Additionally she is monitoring her DM well. ? ?Reviewed side effects, risks and benefits of  medication.   ? ?Patient acknowledged agreement and understanding of the plan.  ? ? ?Follow Up Instructions: ?I discussed the assessment and treatment plan with the patient. The patient was provided an opportunity to ask questions and all were answered. The patient agreed with the plan and demonstrated an understanding of the instructions.  A copy of instructions were sent to the patient via MyChart unless otherwise noted below.  ? ?The patient was advised to call back or seek an in-person evaluation if the symptoms worsen or if the condition fails to improve as anticipated. ? ?Time:  ?I spent 10 minutes with the patient via telehealth technology discussing the above problems/concerns.   ? ?Alicia Mayo, NP ? ?

## 2022-02-03 ENCOUNTER — Telehealth: Payer: 59 | Admitting: Physician Assistant

## 2022-02-03 DIAGNOSIS — L03213 Periorbital cellulitis: Secondary | ICD-10-CM

## 2022-02-03 MED ORDER — AMOXICILLIN-POT CLAVULANATE 875-125 MG PO TABS: 1.0000 | ORAL_TABLET | Freq: Two times a day (BID) | ORAL | 0 refills | Status: DC

## 2022-02-03 NOTE — Progress Notes (Signed)
?Virtual Visit Consent  ? ?[name], you are scheduled for a virtual visit with a [facility] provider today.   ?  ?Just as with appointments in the office, your consent must be obtained to participate.  Your consent will be active for this visit and any virtual visit you may have with one of our providers in the next 365 days.   ?  ?If you have a MyChart account, a copy of this consent can be sent to you electronically.  All virtual visits are billed to your insurance company just like a traditional visit in the office.   ? ?As this is a virtual visit, video technology does not allow for your provider to perform a traditional examination.  This may limit your provider's ability to fully assess your condition.  If your provider identifies any concerns that need to be evaluated in person or the need to arrange testing (such as labs, EKG, etc.), we will make arrangements to do so.   ?  ?Although advances in technology are sophisticated, we cannot ensure that it will always work on either your end or our end.  If the connection with a video visit is poor, the visit may have to be switched to a telephone visit.  With either a video or telephone visit, we are not always able to ensure that we have a secure connection.    ? ?I need to obtain your verbal consent now.   Are you willing to proceed with your visit today?  ?  ?Alicia Neal has provided verbal consent on 02/03/2022 for a virtual visit (video or telephone). ?  ?[name], PA-C  ? ?Date: 02/03/2022 11:33 AM ? ? ?Virtual Visit via Video Note  ? ?ILeeanne Rio, connected with  Alicia Neal  ([id number], 1961-01-05) on 02/03/22 at 11:30 AM EDT by a video-enabled telemedicine application and verified that I am speaking with the correct person using two identifiers. ? ?Location: ?Patient: Virtual Visit Location Patient: Other: Work ?Provider: Virtual Visit Location Provider: Home Office ?  ?I discussed the limitations of evaluation and  management by telemedicine and the availability of in person appointments. The patient expressed understanding and agreed to proceed.   ? ?History of Present Illness: ?Alicia Neal is a 61 y.o. who identifies as a female who was assigned female at birth, and is being seen today for possible worsening of bacterail conjunctivitis of R eye. Symptoms starting about three days ago with redness and drainage of R eye after exposure to pink eye. Was evaluated via video visit where diagnosis was confirmed. Patient was started on Polytrim drops at that time. Notes she has taken as directed. Today is noting some redness and swelling of lower eyelid and cheek with tenderness. Notes mild swelling of preauricular node on R side. Denies fever, vision changes, painful EOM or inability to move the eye. Still without symptoms of L eye. Denies respiratory symptoms. Does not wear contact lenses.  ? ?HPI: HPI  ?Problems:  ?Patient Active Problem List  ? Diagnosis Date Noted  ? Neck pain on right side 08/24/2021  ? Thoracic spine pain 08/24/2021  ? Right shoulder pain 02/15/2021  ? Mildly obese 05/01/2020  ? Essential hypertension, benign 04/17/2018  ? Abnormal CT scan, chest 03/15/2018  ? 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 GAD (generalized anxiety disorder) 10/25/2015  ? Vitamin D deficiency 06/23/2015  ? Pulmonary nodules 03/18/2014  ? Adrenal nodule ([place]) 03/03/2014  ? H/O abnormal cervical Papanicolaou smear  04/15/2013  ? B12 deficiency 10/24/2012  ? GERD (gastroesophageal reflux disease) 10/24/2012  ? Sleep apnea 04/23/2012  ? 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 HTN, goal below 130/80 01/12/2012  ? 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 Mixed hyperlipidemia 05/06/2010  ? 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 Hyperlipidemia LDL goal <100 05/02/2010  ? Allergic rhinitis 05/02/2010  ?  ?Allergies:  ?Allergies  ?Allergen Reactions  ? Ezetimibe-Simvastatin Other (See Comments)  ?  REACTION: muscle aches  ? Pravachol [Pravastatin  Sodium] Other (See Comments)  ?  Joint pains and memory loss  ? Simvastatin Other (See Comments)  ?  REACTION: muscle aches  ? Statins   ? Zetia [Ezetimibe] Other (See Comments)  ?  Generalized joint pains  ? ?Medications:  ?Current Outpatient Medications:  ?  amoxicillin-clavulanate (AUGMENTIN) 875-125 MG tablet, Take 1 tablet by mouth 2 (two) times daily., Disp: 14 tablet, Rfl: 0 ?  amLODipine (NORVASC) 10 MG tablet, TAKE 1 TABLET BY MOUTH DAILY, Disp: 90 tablet, Rfl: 1 ?  blood glucose meter kit and supplies, Dispense Freestyle meter and supplies for once daily testing dx e11.9, Disp: 1 each, Rfl: 0 ?  Cholecalciferol (VITAMIN D) 125 MCG (5000 UT) CAPS, TAKE 1 CAPSULE BY MOUTH ONCE A DAY, Disp: 100 capsule, Rfl: 0 ?  citalopram (CELEXA) 20 MG tablet, TAKE 1 TABLET (20 MG TOTAL) BY MOUTH DAILY., Disp: 90 tablet, Rfl: 3 ?  Evolocumab (REPATHA SURECLICK) 751 MG/ML SOAJ, INJECT 140 MG INTO THE SKIN EVERY 14 DAYS, Disp: 2 mL, Rfl: 2 ?  glucose blood test strip, Use to check blood sugar twice daily.   Accu-Chek Guide Test Strip, Disp: 100 each, Rfl: 2 ?  ibuprofen (ADVIL) 200 MG tablet, Take 800 mg by mouth as needed., Disp: , Rfl:  ?  loratadine (CLARITIN) 10 MG tablet, Take 10 mg by mouth as needed., Disp: , Rfl:  ?  losartan (COZAAR) 100 MG tablet, TAKE 1 TABLET BY MOUTH ONCE DAILY, Disp: 90 tablet, Rfl: 3 ?  metFORMIN (GLUCOPHAGE) 1000 MG tablet, TAKE 1 TABLET BY MOUTH TWICE DAILY WITH MEALS., Disp: 180 tablet, Rfl: 0 ?  pantoprazole (PROTONIX) 40 MG tablet, TAKE 1 TABLET BY MOUTH ONCE DAILY, Disp: 90 tablet, Rfl: 3 ?  tirzepatide (MOUNJARO) 5 MG/0.5ML Pen, Inject 5 mg into the skin once a week., Disp: 6 mL, Rfl: 3 ?  trimethoprim-polymyxin b (POLYTRIM) ophthalmic solution, Place 1 drop into the right eye in the morning, at noon, in the evening, and at bedtime., Disp: 10 mL, Rfl: 0 ? ?Observations/Objective: ?Patient is well-developed, well-nourished in no acute distress.  ?Resting comfortably at home.  ?Head is  normocephalic, atraumatic.  ?No labored breathing. ?Speech is clear and coherent with logical content.  ?Patient is alert and oriented at baseline.  ?R eye with conjunctival injection and mild drainage. No swelling of upper eyelid. Mild swelling of lower eyelid with some down spreading erythema. PERRLA. EOMI and without any pain with movement of R eye.  ? ?Assessment and Plan: ?1. Preseptal cellulitis of right eye ?- amoxicillin-clavulanate (AUGMENTIN) 875-125 MG tablet; Take 1 tablet by mouth 2 (two) times daily.  Dispense: 14 tablet; Refill: 0 ? ?Secondary to conjunctivitis. No signs/symptoms of posterior infection -- no change in vision, decreased or painful EOMI, high fever -- to warrant ER evaluation. Did review signs/symptoms of this and strict ER precautions given. Will have her continue good hand hygiene, warm compresses and Polytrim. Will add on Augmenting BID x 7 days. If not  starting to improve within 48 hours she needs and in-person evaluation to be cautious.  ? ?Follow Up Instructions: ?I discussed the assessment and treatment plan with the patient. The patient was provided an opportunity to ask questions and all were answered. The patient agreed with the plan and demonstrated an understanding of the instructions.  A copy of instructions were sent to the patient via MyChart unless otherwise noted below.  ? ?The patient was advised to call back or seek an in-person evaluation if the symptoms worsen or if the condition fails to improve as anticipated. ? ?Time:  ?I spent 10 minutes with the patient via telehealth technology discussing the above problems/concerns.   ? ?[name], PA-C ?

## 2022-02-03 NOTE — Patient Instructions (Signed)
?[name], thank you for joining [name], PA-C for today's virtual visit.  While this provider is not your primary care provider (PCP), if your PCP is located in our provider database this encounter information will be shared with them immediately following your visit. ? ?Consent: ?(Patient) Alicia Neal provided verbal consent for this virtual visit at the beginning of the encounter. ? ?Current Medications: ? ?Current Outpatient Medications:  ?  amoxicillin-clavulanate (AUGMENTIN) 875-125 MG tablet, Take 1 tablet by mouth 2 (two) times daily., Disp: 14 tablet, Rfl: 0 ?  amLODipine (NORVASC) 10 MG tablet, TAKE 1 TABLET BY MOUTH DAILY, Disp: 90 tablet, Rfl: 1 ?  blood glucose meter kit and supplies, Dispense Freestyle meter and supplies for once daily testing dx e11.9, Disp: 1 each, Rfl: 0 ?  Cholecalciferol (VITAMIN D) 125 MCG (5000 UT) CAPS, TAKE 1 CAPSULE BY MOUTH ONCE A DAY, Disp: 100 capsule, Rfl: 0 ?  citalopram (CELEXA) 20 MG tablet, TAKE 1 TABLET (20 MG TOTAL) BY MOUTH DAILY., Disp: 90 tablet, Rfl: 3 ?  Evolocumab (REPATHA SURECLICK) 841 MG/ML SOAJ, INJECT 140 MG INTO THE SKIN EVERY 14 DAYS, Disp: 2 mL, Rfl: 2 ?  glucose blood test strip, Use to check blood sugar twice daily.   Accu-Chek Guide Test Strip, Disp: 100 each, Rfl: 2 ?  ibuprofen (ADVIL) 200 MG tablet, Take 800 mg by mouth as needed., Disp: , Rfl:  ?  loratadine (CLARITIN) 10 MG tablet, Take 10 mg by mouth as needed., Disp: , Rfl:  ?  losartan (COZAAR) 100 MG tablet, TAKE 1 TABLET BY MOUTH ONCE DAILY, Disp: 90 tablet, Rfl: 3 ?  metFORMIN (GLUCOPHAGE) 1000 MG tablet, TAKE 1 TABLET BY MOUTH TWICE DAILY WITH MEALS., Disp: 180 tablet, Rfl: 0 ?  pantoprazole (PROTONIX) 40 MG tablet, TAKE 1 TABLET BY MOUTH ONCE DAILY, Disp: 90 tablet, Rfl: 3 ?  tirzepatide (MOUNJARO) 5 MG/0.5ML Pen, Inject 5 mg into the skin once a week., Disp: 6 mL, Rfl: 3 ?  trimethoprim-polymyxin b (POLYTRIM) ophthalmic solution, Place 1 drop into the right  eye in the morning, at noon, in the evening, and at bedtime., Disp: 10 mL, Rfl: 0  ? ?Medications ordered in this encounter:  ?Meds ordered this encounter  ?Medications  ? amoxicillin-clavulanate (AUGMENTIN) 875-125 MG tablet  ?  Sig: Take 1 tablet by mouth 2 (two) times daily.  ?  Dispense:  14 tablet  ?  Refill:  0  ?  Order Specific Question:   Supervising Provider  ?  Answer:   Noemi Chapel [[id number]]  ?  ? ?*If you need refills on other medications prior to your next appointment, please contact your pharmacy* ? ?Follow-Up: ?Call back or seek an in-person evaluation if the symptoms worsen or if the condition fails to improve as anticipated. ? ?Other Instructions ?Please keep well-hydrated and get plenty of rest.  ?Keep hands washed and avoid touching/rubbing the eyes. ?Ok to continue compresses and the Polytrim drops. ?Take the Augmentin as directed with food. I also recommend a daily probiotic.  ? ?You should note things leveling off within 48 hours and then continuously improving until resolved. If not I want you to be evaluated in person. ? ?If you develop any vision change, worsening redness despite antibiotic, fever or painful eye movements -- ER ASAP. DO NOT DELAY CARE.  ? ? ?If you have been instructed to have an in-person evaluation today at a local Urgent Care facility, please use the link below. It will  take you to a list of all of our available [facility], including address, phone number and hours of operation. Please do not delay care.  ?[facility] ? ?If you or a family member do not have a primary care provider, use the link below to schedule a visit and establish care. When you choose a [facility] primary care physician or advanced practice provider, you gain a long-term partner in health. ?Find a Primary Care Provider ? ?Learn more about [facility]'s in-office and virtual care options: ?[facility]  ?

## 2022-02-04 ENCOUNTER — Other Ambulatory Visit (HOSPITAL_COMMUNITY): Payer: Self-pay

## 2022-02-07 ENCOUNTER — Ambulatory Visit (HOSPITAL_COMMUNITY)
Admission: RE | Admit: 2022-02-07 | Discharge: 2022-02-07 | Disposition: A | Discharge status: Home / Self Care | Payer: 59 | Source: Ambulatory Visit | Attending: Family Medicine | Admitting: Family Medicine

## 2022-02-07 DIAGNOSIS — Z1231 Encounter for screening mammogram for malignant neoplasm of breast: Secondary | ICD-10-CM | POA: Diagnosis not present

## 2022-02-13 ENCOUNTER — Other Ambulatory Visit: Payer: Self-pay | Admitting: Nurse Practitioner

## 2022-02-14 ENCOUNTER — Other Ambulatory Visit (HOSPITAL_COMMUNITY): Payer: Self-pay

## 2022-02-14 MED ORDER — REPATHA SURECLICK 140 MG/ML ~~LOC~~ SOAJ
SUBCUTANEOUS | 2 refills | Status: DC
  Filled 2022-02-14: qty 2, 28d supply, fill #0
  Filled 2022-03-13: qty 2, 28d supply, fill #1
  Filled 2022-04-17: qty 2, 28d supply, fill #2

## 2022-02-23 ENCOUNTER — Other Ambulatory Visit (HOSPITAL_COMMUNITY): Payer: Self-pay

## 2022-03-08 DIAGNOSIS — E119 Type 2 diabetes mellitus without complications: Secondary | ICD-10-CM | POA: Diagnosis not present

## 2022-03-08 LAB — HM DIABETES EYE EXAM

## 2022-03-14 ENCOUNTER — Other Ambulatory Visit (HOSPITAL_COMMUNITY): Payer: Self-pay

## 2022-03-18 ENCOUNTER — Other Ambulatory Visit (HOSPITAL_COMMUNITY): Payer: Self-pay

## 2022-04-01 ENCOUNTER — Other Ambulatory Visit: Payer: Self-pay | Admitting: "Endocrinology

## 2022-04-01 ENCOUNTER — Other Ambulatory Visit (HOSPITAL_COMMUNITY): Payer: Self-pay

## 2022-04-01 MED ORDER — METFORMIN HCL 1000 MG PO TABS
ORAL_TABLET | Freq: Two times a day (BID) | ORAL | 0 refills | Status: DC
  Filled 2022-04-01: qty 180, 90d supply, fill #0

## 2022-04-15 DIAGNOSIS — E1165 Type 2 diabetes mellitus with hyperglycemia: Secondary | ICD-10-CM

## 2022-04-15 NOTE — Telephone Encounter (Signed)
Done

## 2022-04-18 ENCOUNTER — Encounter: Payer: Self-pay | Admitting: Nurse Practitioner

## 2022-04-18 ENCOUNTER — Other Ambulatory Visit (HOSPITAL_COMMUNITY): Payer: Self-pay

## 2022-04-18 ENCOUNTER — Telehealth: Payer: 59 | Admitting: Nurse Practitioner

## 2022-04-18 VITALS — Wt 160.0 lb

## 2022-04-18 DIAGNOSIS — E1165 Type 2 diabetes mellitus with hyperglycemia: Secondary | ICD-10-CM | POA: Diagnosis not present

## 2022-04-18 DIAGNOSIS — I10 Essential (primary) hypertension: Secondary | ICD-10-CM

## 2022-04-18 DIAGNOSIS — Z789 Other specified health status: Secondary | ICD-10-CM

## 2022-04-18 DIAGNOSIS — E782 Mixed hyperlipidemia: Secondary | ICD-10-CM

## 2022-04-18 DIAGNOSIS — E559 Vitamin D deficiency, unspecified: Secondary | ICD-10-CM | POA: Diagnosis not present

## 2022-04-18 MED ORDER — TIRZEPATIDE 7.5 MG/0.5ML ~~LOC~~ SOAJ
7.5000 mg | SUBCUTANEOUS | 3 refills | Status: DC
  Filled 2022-04-18: qty 6, 84d supply, fill #0
  Filled 2022-07-06: qty 6, 84d supply, fill #1
  Filled 2022-09-28: qty 2, 28d supply, fill #2
  Filled 2022-10-27: qty 2, 28d supply, fill #3
  Filled 2022-11-24: qty 2, 28d supply, fill #4
  Filled 2022-12-30: qty 2, 28d supply, fill #5

## 2022-04-18 NOTE — Patient Instructions (Signed)
Diabetes Mellitus and Foot Care Foot care is an important part of your health, especially when you have diabetes. Diabetes may cause you to have problems because of poor blood flow (circulation) to your feet and legs, which can cause your skin to: Become thinner and drier. Break more easily. Heal more slowly. Peel and crack. You may also have nerve damage (neuropathy) in your legs and feet, causing decreased feeling in them. This means that you may not notice minor injuries to your feet that could lead to more serious problems. Noticing and addressing any potential problems early is the best way to prevent future foot problems. How to care for your feet Foot hygiene  Wash your feet daily with warm water and mild soap. Do not use hot water. Then, pat your feet and the areas between your toes until they are completely dry. Do not soak your feet as this can dry your skin. Trim your toenails straight across. Do not dig under them or around the cuticle. File the edges of your nails with an emery board or nail file. Apply a moisturizing lotion or petroleum jelly to the skin on your feet and to dry, brittle toenails. Use lotion that does not contain alcohol and is unscented. Do not apply lotion between your toes. Shoes and socks Wear clean socks or stockings every day. Make sure they are not too tight. Do not wear knee-high stockings since they may decrease blood flow to your legs. Wear shoes that fit properly and have enough cushioning. Always look in your shoes before you put them on to be sure there are no objects inside. To break in new shoes, wear them for just a few hours a day. This prevents injuries on your feet. Wounds, scrapes, corns, and calluses  Check your feet daily for blisters, cuts, bruises, sores, and redness. If you cannot see the bottom of your feet, use a mirror or ask someone for help. Do not cut corns or calluses or try to remove them with medicine. If you find a minor scrape,  cut, or break in the skin on your feet, keep it and the skin around it clean and dry. You may clean these areas with mild soap and water. Do not clean the area with peroxide, alcohol, or iodine. If you have a wound, scrape, corn, or callus on your foot, look at it several times a day to make sure it is healing and not infected. Check for: Redness, swelling, or pain. Fluid or blood. Warmth. Pus or a bad smell. General tips Do not cross your legs. This may decrease blood flow to your feet. Do not use heating pads or hot water bottles on your feet. They may burn your skin. If you have lost feeling in your feet or legs, you may not know this is happening until it is too late. Protect your feet from hot and cold by wearing shoes, such as at the beach or on hot pavement. Schedule a complete foot exam at least once a year (annually) or more often if you have foot problems. Report any cuts, sores, or bruises to your health care provider immediately. Where to find more information American Diabetes Association: www.diabetes.org Association of Diabetes [employer] & Education Specialists: www.diabeteseducator.org Contact a health care provider if: You have a medical condition that increases your risk of infection and you have any cuts, sores, or bruises on your feet. You have an injury that is not healing. You have redness on your legs or feet. You   feel burning or tingling in your legs or feet. You have pain or cramps in your legs and feet. Your legs or feet are numb. Your feet always feel cold. You have pain around any toenails. Get help right away if: You have a wound, scrape, corn, or callus on your foot and: You have pain, swelling, or redness that gets worse. You have fluid or blood coming from the wound, scrape, corn, or callus. Your wound, scrape, corn, or callus feels warm to the touch. You have pus or a bad smell coming from the wound, scrape, corn, or callus. You have a fever. You have a red  line going up your leg. Summary Check your feet every day for blisters, cuts, bruises, sores, and redness. Apply a moisturizing lotion or petroleum jelly to the skin on your feet and to dry, brittle toenails. Wear shoes that fit properly and have enough cushioning. If you have foot problems, report any cuts, sores, or bruises to your health care provider immediately. Schedule a complete foot exam at least once a year (annually) or more often if you have foot problems. This information is not intended to replace advice given to you by your health care provider. Make sure you discuss any questions you have with your health care provider. Document Revised: 05/14/2020 Document Reviewed: 05/14/2020 [employer]vier Patient Education  2023 [employer]  

## 2022-04-18 NOTE — Progress Notes (Signed)
04/18/2022, 8:20 AM                                             Endocrinology follow-up note   TELEHEALTH VISIT: The patient is being engaged in telehealth visit due to COVID-19.  This type of visit limits physical examination significantly, and thus is not preferable over face-to-face encounters.  I connected with  YOSELYN MCGLADE on 04/18/22 by a video enabled telemedicine application and verified that I am speaking with the correct person using two identifiers.   I discussed the limitations of evaluation and management by telemedicine. The patient expressed understanding and agreed to proceed.    The participants involved in this visit include: Brita Romp, NP located at [facility] and [name]  located at their personal residence listed.    Subjective:    Patient ID: Alicia Neal, female    DOB: 05-05-1961.  Alicia Neal is being seen in follow-up for management of currently uncontrolled symptomatic diabetes, hyperlipidemia. PMD:   Fayrene Helper, MD.   Past Medical History:  Diagnosis Date   Adrenal adenoma    right side   Allergic rhinitis    Allergy    cat,dog,animal dander   Anxiety disorder    Carpal tunnel syndrome of left wrist    had surgery to repair   Chronic depression    Dr. Cheryln Manly   GERD (gastroesophageal reflux disease)    History of abnormal cervical Pap smear    History of adenomatous polyp of colon    History of hiatal hernia    Hyperlipidemia    Hypertension    IBS (irritable bowel syndrome)    OSA (obstructive sleep apnea)    per pt study yrs ago-- CPAP intolerant    Pemphigoid, cicatricial    pruritic autoimmune blistering skin disorder   PONV (postoperative nausea and vomiting)    Pulmonary nodule, right pulmologist-  dr Nathaneil Canary mcquaid   x2    per CT 09-11-2015   Trigger finger, left    ring finger   Type 2 diabetes mellitus ([place])    Uncontrolled type  2 diabetes mellitus with hyperglycemia ([place]) 04/17/2018   Wears glasses    Past Surgical History:  Procedure Laterality Date   CARDIOVASCULAR STRESS TEST  04-30-2003   Small focus of decreased perfusion on rest images in the mid-distal anterior wall (worrisome for ischemia),  normal LV function and wall motion , ef 60%   CARPAL TUNNEL RELEASE Left 11/26/2015   Procedure: CARPAL TUNNEL RELEASE;  Surgeon: Iran Planas, MD;  Location: [place];  Service: Orthopedics;  Laterality: Left;   CHOLECYSTECTOMY  1989   COLONOSCOPY  11/04/2009   POLYPECTOMY     TRANSTHORACIC ECHOCARDIOGRAM  04-30-2003   mild hypokinetic in the inferior and posterior wall, ef 50%/  trivial MR /  mild TR   TRIGGER FINGER RELEASE Left 11/26/2015   Procedure: RELEASE TRIGGER FINGER/A-1 PULLEY LEFT RING  FINGER;  Surgeon: Iran Planas, MD;  Location: [place];  Service: Orthopedics;  Laterality: Left;   TUBAL LIGATION  2000   Social History   Socioeconomic History   Marital status: Single    Spouse name: Not on file   Number of children: 1   Years of education: Not on file  Highest education level: Not on file  Occupational History   Not on file  Tobacco Use   Smoking status: Former    Packs/day: 1.00    Years: 40.00    Total pack years: 40.00    Types: Cigarettes    Quit date: 11/07/2013    Years since quitting: 8.4   Smokeless tobacco: Never  Vaping Use   Vaping Use: Never used  Substance and Sexual Activity   Alcohol use: Yes    Alcohol/week: 0.0 standard drinks of alcohol    Comment: occasional   Drug use: No   Sexual activity: Not Currently    Birth control/protection: Post-menopausal, Surgical    Comment: tubal  Other Topics Concern   Not on file  Social History Narrative   Not on file   Social Determinants of Health   Financial Resource Strain: Low Risk  (07/13/2021)   Overall Financial Resource Strain (CARDIA)    Difficulty of Paying Living Expenses: Not hard at all  Food Insecurity: No Food  Insecurity (07/13/2021)   Hunger Vital Sign    Worried About Running Out of Food in the Last Year: Never true    [place] in the Last Year: Never true  Transportation Needs: No Transportation Needs (07/13/2021)   PRAPARE - Hydrologist (Medical): No    Lack of Transportation (Non-Medical): No  Physical Activity: Sufficiently Active (07/13/2021)   Exercise Vital Sign    Days of Exercise per Week: 4 days    Minutes of Exercise per Session: 40 min  Stress: Stress Concern Present (07/13/2021)   [place]    Feeling of Stress : To some extent  Social Connections: Socially Isolated (07/13/2021)   Social Connection and Isolation Panel [NHANES]    Frequency of Communication with Friends and Family: Three times a week    Frequency of Social Gatherings with Friends and Family: Twice a week    Attends Religious Services: Never    Marine scientist or Organizations: No    Attends Archivist Meetings: Never    Marital Status: Divorced   Outpatient Encounter Medications as of 04/18/2022  Medication Sig   tirzepatide (MOUNJARO) 7.5 MG/0.5ML Pen Inject 7.5 mg into the skin once a week.   amLODipine (NORVASC) 10 MG tablet TAKE 1 TABLET BY MOUTH DAILY   amoxicillin-clavulanate (AUGMENTIN) 875-125 MG tablet Take 1 tablet by mouth 2 (two) times daily.   blood glucose meter kit and supplies Dispense Freestyle meter and supplies for once daily testing dx e11.9   Cholecalciferol (VITAMIN D) 125 MCG (5000 UT) CAPS TAKE 1 CAPSULE BY MOUTH ONCE A DAY   citalopram (CELEXA) 20 MG tablet TAKE 1 TABLET (20 MG TOTAL) BY MOUTH DAILY.   Evolocumab (REPATHA SURECLICK) 482 MG/ML SOAJ INJECT 140 MG INTO THE SKIN EVERY 14 DAYS   glucose blood test strip Use to check blood sugar twice daily.   Accu-Chek Guide Test Strip   ibuprofen (ADVIL) 200 MG tablet Take 800 mg by mouth as needed.   loratadine (CLARITIN) 10 MG  tablet Take 10 mg by mouth as needed.   losartan (COZAAR) 100 MG tablet TAKE 1 TABLET BY MOUTH ONCE DAILY   pantoprazole (PROTONIX) 40 MG tablet TAKE 1 TABLET BY MOUTH ONCE DAILY   trimethoprim-polymyxin b (POLYTRIM) ophthalmic solution Place 1 drop into the right eye in the morning, at noon, in the evening, and at bedtime.   [  DISCONTINUED] metFORMIN (GLUCOPHAGE) 1000 MG tablet TAKE 1 TABLET BY MOUTH TWICE DAILY WITH MEALS.   [DISCONTINUED] tirzepatide [facility]) 5 MG/0.5ML Pen Inject 5 mg into the skin once a week.   No facility-administered encounter medications on file as of 04/18/2022.    ALLERGIES: Allergies  Allergen Reactions   Ezetimibe-Simvastatin Other (See Comments)    REACTION: muscle aches   Pravachol [Pravastatin Sodium] Other (See Comments)    Joint pains and memory loss   Simvastatin Other (See Comments)    REACTION: muscle aches   Statins    Zetia [Ezetimibe] Other (See Comments)    Generalized joint pains    VACCINATION STATUS: Immunization History  Administered Date(s) Administered   Influenza Split 07/19/2013, 07/22/2014   Influenza-Unspecified 07/28/2020, 08/12/2021   Moderna Sars-Covid-2 Vaccination 11/05/2019, 11/13/2019, 09/10/2020   Pneumococcal Conjugate-13 06/25/2014   Pneumococcal Polysaccharide-23 07/29/2019   Tdap 06/25/2014   Zoster Recombinat (Shingrix) 06/12/2018    Diabetes She presents for her follow-up diabetic visit. She has type 2 diabetes mellitus. Onset time: She was diagnosed at approximate age of 63 years. Her disease course has been stable. Pertinent negatives for hypoglycemia include no confusion, headaches, hunger, pallor or seizures. Pertinent negatives for diabetes include no chest pain, no fatigue, no polydipsia, no polyphagia, no polyuria and no weight loss. There are no hypoglycemic complications. Symptoms are stable. There are no diabetic complications. Risk factors for coronary artery disease include diabetes mellitus,  dyslipidemia, hypertension, tobacco exposure and post-menopausal. Current diabetic treatment includes oral agent (monotherapy) (and Mounjaro). She is compliant with treatment all of the time. Her weight is stable. She is following a generally healthy diet. When asked about meal planning, she reported none. She has not had a previous visit with a dietitian. She participates in exercise intermittently. Her home blood glucose trend is fluctuating minimally. Her breakfast blood glucose range is generally 110-130 mg/dl. (She presents today for her virtual visit.  She reports fasting readings around 120-135.  She reports she still eats take out food mostly but chooses healthier options now.  She also reports her weight loss has stalled in recent months.  She did not have her A1c done prior to todays visit but plans on having it done today.) An ACE inhibitor/angiotensin II receptor blocker is being taken. She does not see a podiatrist.Eye exam is current.  Hyperlipidemia This is a chronic problem. The current episode started more than 1 year ago. The problem is controlled. Recent lipid tests were reviewed and are normal. Exacerbating diseases include diabetes. There are no known factors aggravating her hyperlipidemia. Pertinent negatives include no chest pain, myalgias or shortness of breath. Treatments tried: Patient is not tolerating statins including Livalo.  She is on Repatha. The current treatment provides moderate improvement of lipids. There are no compliance problems.  Risk factors for coronary artery disease include dyslipidemia, diabetes mellitus, hypertension and post-menopausal.  Hypertension This is a chronic problem. The current episode started more than 1 year ago. The problem has been resolved since onset. The problem is controlled. Pertinent negatives include no chest pain, headaches, palpitations or shortness of breath. There are no associated agents to hypertension. Risk factors for coronary artery  disease include dyslipidemia, diabetes mellitus and smoking/tobacco exposure. Past treatments include calcium channel blockers and angiotensin blockers. The current treatment provides moderate improvement. There are no compliance problems.  Identifiable causes of hypertension include sleep apnea.     Review of systems  Constitutional: + weight loss plateaued,  current Body mass index is 30.23  kg/m. , no fatigue, no subjective hyperthermia, no subjective hypothermia Eyes: no blurry vision, no xerophthalmia ENT: no sore throat, no nodules palpated in throat, no dysphagia/odynophagia, no hoarseness Cardiovascular: no chest pain, no shortness of breath, no palpitations, no leg swelling Respiratory: no cough, no shortness of breath Gastrointestinal: no nausea/vomiting/diarrhea Musculoskeletal: no muscle/joint aches Skin: no rashes, no hyperemia Neurological: no tremors, no numbness, no tingling, no dizziness Psychiatric: no depression, no anxiety   Objective:    Wt 160 lb (72.6 kg)   BMI 30.23 kg/m   Wt Readings from Last 3 Encounters:  04/18/22 160 lb (72.6 kg)  12/29/21 166 lb (75.3 kg)  12/28/21 165 lb 1.9 oz (74.9 kg)   BP Readings from Last 3 Encounters:  12/29/21 138/90  12/28/21 108/72  12/07/21 127/75    Physical Exam- Telehealth- significantly limited due to nature of visit  Constitutional: Body mass index is 30.23 kg/m. , not in acute distress, normal state of mind Respiratory: Adequate breathing efforts    CMP     Component Value Date/Time   NA 137 12/28/2021 0916   K 4.3 12/28/2021 0916   CL 96 12/28/2021 0916   CO2 25 12/28/2021 0916   GLUCOSE 99 12/28/2021 0916   GLUCOSE 223 (H) 08/04/2021 0908   BUN 13 12/28/2021 0916   CREATININE 0.75 12/28/2021 0916   CREATININE 0.84 11/18/2016 0844   CALCIUM 9.5 12/28/2021 0916   PROT 7.1 12/28/2021 0916   ALBUMIN 4.9 (H) 12/28/2021 0916   AST 18 12/28/2021 0916   ALT 18 12/28/2021 0916   ALKPHOS 75 12/28/2021  0916   BILITOT 0.3 12/28/2021 0916   GFRNONAA >60 08/04/2021 0908   GFRNONAA 77 11/18/2016 0844   GFRAA >60 08/03/2020 0939   GFRAA 89 11/18/2016 0844    Diabetic Labs (most recent): Lab Results  Component Value Date   HGBA1C 6.3 12/07/2021   HGBA1C 9.4 (A) 08/05/2021   HGBA1C 7.5 (H) 01/25/2021   MICROALBUR <3.0 (H) 01/25/2021   MICROALBUR <3.0 (H) 08/03/2020   MICROALBUR 26.1 (H) 08/01/2019     Lipid Panel ( most recent) Lipid Panel     Component Value Date/Time   CHOL 177 12/28/2021 0916   TRIG 139 12/28/2021 0916   HDL 57 12/28/2021 0916   CHOLHDL 3.1 12/28/2021 0916   CHOLHDL 2.4 08/04/2021 0908   VLDL 30 08/04/2021 0908   LDLCALC 96 12/28/2021 0916      Lab Results  Component Value Date   TSH 2.286 08/04/2021   TSH 2.047 01/25/2021   TSH 1.827 08/03/2020   TSH 1.483 08/01/2019   TSH 1.601 06/08/2018   TSH 1.724 06/08/2017   TSH 1.620 06/08/2015   TSH 1.739 06/23/2014   TSH 1.659 04/15/2013   TSH 1.634 01/11/2012   FREET4 0.97 08/04/2021   FREET4 0.92 06/08/2018      Assessment & Plan:   1) Uncontrolled type 2 diabetes mellitus with hyperglycemia ([place])  - Jasira H Smucker has currently uncontrolled symptomatic type 2 DM since 61 years of age.  She presents today for her virtual visit.  She reports fasting readings around 120-135.  She reports she still eats take out food mostly but chooses healthier options now.  She also reports her weight loss has stalled in recent months.  She did not have her A1c done prior to todays visit but plans on having it done today.  -She does not report any gross complications from her diabetes, however she remains at a high risk for acute  and chronic complications of type 2 diabetes which include CAD, CVA, CKD, retinopathy, and neuropathy. These are all discussed in l with the patient.  - Nutritional counseling repeated at each appointment due to patients tendency to fall back in to old habits.  - The patient admits  there is a room for improvement in their diet and drink choices. -  Suggestion is made for the patient to avoid simple carbohydrates from their diet including Cakes, Sweet Desserts / Pastries, Ice Cream, Soda (diet and regular), Sweet Tea, Candies, Chips, Cookies, Sweet Pastries, Store Bought Juices, Alcohol in Excess of 1-2 drinks a day, Artificial Sweeteners, Coffee Creamer, and "Sugar-free" Products. This will help patient to have stable blood glucose profile and potentially avoid unintended weight gain.   - I encouraged the patient to switch to unprocessed or minimally processed complex starch and increased protein intake (animal or plant source), fruits, and vegetables.   - Patient is advised to stick to a routine mealtimes to eat 3 meals a day and avoid unnecessary snacks (to snack only to correct hypoglycemia).  - she has seen Jearld Fenton, RDN, CDE for individualized diabetes education.  - I have approached her with the following individualized plan to manage diabetes and patient agrees:   -Given her drastic improvement, she is advised to continue Metformin 1000 mg po twice daily with meals and Mounjaro 5 mg SQ weekly.  May start lowering dose of Metformin on subsequent visits if she continues with this progress.   -She does not need to routinely monitor glucose given her current, safe medication regimen.  - Patient specific target  A1c;  LDL, HDL, Triglycerides, were discussed in detail.  2) BP/HTN:  Her blood pressure is controlled to target.  She is advised to continue Norvasc 10 mg po daily and Losartan 100 mg po daily.  3) Lipids/HPL:  Her most recent lipid panel from 08/04/21 shows controlled LDL of 45- improving since starting Repatha.  She is advised to continue Repatha 140 mg SQ every other week.  She is intolerant to statins.  4)  Weight/Diet:  Her Body mass index is 30.23 kg/m.  She would benefit from more weight loss.  CDE Consult has been  initiated , exercise, and  detailed carbohydrates information provided.  5) Chronic Care/Health Maintenance: -she is on ACEI/ARB medications and is encouraged to continue to follow up with Ophthalmology, Dentist, Podiatrist at least yearly or according to recommendations, and advised to stay away from smoking. I have recommended yearly flu vaccine and pneumonia vaccination at least every 5 years; moderate intensity exercise for up to 150 minutes weekly; and  sleep for at least 7 hours a day.  - I advised patient to maintain close follow up with Fayrene Helper, MD for primary care needs.      I spent 20 minutes dedicated to the care of this patient on the date of this encounter to include pre-visit review of records, face-to-face time with the patient, and post visit ordering of  testing.    Please refer to Patient Instructions for Blood Glucose Monitoring and Insulin/Medications Dosing Guide"  in media tab for additional information. Please  also refer to " Patient Self Inventory" in the Media  tab for reviewed elements of pertinent patient history.  TERENCE GOOGE participated in the discussions, expressed understanding, and voiced agreement with the above plans.  All questions were answered to her satisfaction. she is encouraged to contact clinic should she have any questions or concerns prior to  her return visit.    Follow up plan: - Return in about 4 months (around 08/18/2022) for Diabetes F/U with A1c in office, No previsit labs.  Rayetta Pigg, [facility] [address] [place], [place] [address] Phone: [phone] Fax: [phone]  04/18/2022, 8:20 AM

## 2022-04-20 ENCOUNTER — Ambulatory Visit: Payer: 59 | Admitting: Nurse Practitioner

## 2022-04-21 ENCOUNTER — Other Ambulatory Visit (HOSPITAL_COMMUNITY)
Admission: RE | Admit: 2022-04-21 | Discharge: 2022-04-21 | Disposition: A | Discharge status: Home / Self Care | Payer: 59 | Source: Ambulatory Visit | Attending: Nurse Practitioner | Admitting: Nurse Practitioner

## 2022-04-21 DIAGNOSIS — E1165 Type 2 diabetes mellitus with hyperglycemia: Secondary | ICD-10-CM | POA: Insufficient documentation

## 2022-04-21 LAB — HEMOGLOBIN A1C
Hgb A1c MFr Bld: 5.9 % — ABNORMAL HIGH (ref 4.8–5.6)
Mean Plasma Glucose: 122.63 mg/dL

## 2022-05-04 ENCOUNTER — Other Ambulatory Visit (HOSPITAL_COMMUNITY): Payer: Self-pay

## 2022-05-15 ENCOUNTER — Other Ambulatory Visit: Payer: Self-pay | Admitting: Nurse Practitioner

## 2022-05-16 ENCOUNTER — Other Ambulatory Visit (HOSPITAL_COMMUNITY): Payer: Self-pay

## 2022-05-16 MED ORDER — REPATHA SURECLICK 140 MG/ML ~~LOC~~ SOAJ
SUBCUTANEOUS | 2 refills | Status: DC
  Filled 2022-05-16: qty 2, 28d supply, fill #0
  Filled 2022-06-10: qty 2, 28d supply, fill #1
  Filled 2022-07-06: qty 2, 28d supply, fill #2

## 2022-05-25 ENCOUNTER — Other Ambulatory Visit (HOSPITAL_COMMUNITY): Payer: Self-pay

## 2022-06-10 ENCOUNTER — Other Ambulatory Visit (HOSPITAL_COMMUNITY): Payer: Self-pay

## 2022-06-10 ENCOUNTER — Other Ambulatory Visit: Payer: Self-pay | Admitting: Nurse Practitioner

## 2022-06-10 DIAGNOSIS — I10 Essential (primary) hypertension: Secondary | ICD-10-CM

## 2022-06-13 ENCOUNTER — Other Ambulatory Visit (HOSPITAL_COMMUNITY): Payer: Self-pay

## 2022-06-13 MED ORDER — AMLODIPINE BESYLATE 10 MG PO TABS
ORAL_TABLET | Freq: Every day | ORAL | 1 refills | Status: DC
  Filled 2022-06-13: qty 90, 90d supply, fill #0
  Filled 2022-07-31 – 2022-09-22 (×2): qty 90, 90d supply, fill #1

## 2022-07-07 ENCOUNTER — Other Ambulatory Visit (HOSPITAL_COMMUNITY): Payer: Self-pay

## 2022-07-08 ENCOUNTER — Other Ambulatory Visit (HOSPITAL_COMMUNITY): Payer: Self-pay

## 2022-07-19 ENCOUNTER — Encounter: Payer: Self-pay | Admitting: Women's Health

## 2022-07-19 ENCOUNTER — Ambulatory Visit (INDEPENDENT_AMBULATORY_CARE_PROVIDER_SITE_OTHER): Payer: 59 | Admitting: Women's Health

## 2022-07-19 VITALS — BP 139/84 | HR 72 | Ht 62.0 in | Wt 166.0 lb

## 2022-07-19 DIAGNOSIS — Z01419 Encounter for gynecological examination (general) (routine) without abnormal findings: Secondary | ICD-10-CM

## 2022-07-19 DIAGNOSIS — N3941 Urge incontinence: Secondary | ICD-10-CM | POA: Diagnosis not present

## 2022-07-19 NOTE — Progress Notes (Signed)
WELL-WOMAN EXAMINATION Patient name: Alicia Neal MRN [id number]  Date of birth: 1961/01/19 Chief Complaint:   Annual Exam  History of Present Illness:   Alicia Neal is a 61 y.o. G56P2 Caucasian female being seen today for a routine well-woman exam.  Current complaints: none. Still leaking urine- mainly after driving [id number] home from work and gets to house and has some urge incontinence. Doesn't drink caffeine after 1000. Drinks water mainly throughout the day. Doesn't want to do anything about it right now. Plans to retire Jan 2025. No vaginal bleeding.  PCP: Moshe Cipro      does not desire labs No LMP recorded. Patient is postmenopausal. The current method of family planning is post menopausal status.  Last pap 07/06/20. Results were: NILM w/ HRHPV negative. H/O abnormal pap: h/o abnormal pap and cryosurgery w/ multiple subsequent normal paps Last mammogram: 02/07/22. Results were: normal. Family h/o breast cancer: no Last colonoscopy: 12/23/19. Results were: abnormal tubular adenoma polyps removed, f/u 66yr . Family h/o colorectal cancer: yes maternal grandparents and [facility]    07/19/2022    8:37 AM 12/28/2021    8:37 AM 11/18/2021   11:44 AM 08/24/2021    8:40 AM 07/13/2021    8:43 AM  Depression screen PHQ 2/9  Decreased Interest 0 0 0 0 0  Down, Depressed, Hopeless 0 0 0 0 0  PHQ - 2 Score 0 0 0 0 0  Altered sleeping 1   0 1  Tired, decreased energy 0   0 0  Change in appetite 0   0 0  Feeling bad or failure about yourself  1   0 2  Trouble concentrating 0   0 0  Moving slowly or fidgety/restless 0   0 0  Suicidal thoughts 0   0 0  PHQ-9 Score 2   0 3        07/19/2022    8:37 AM 07/13/2021    8:43 AM 07/06/2020    8:52 AM 11/13/2019    8:57 AM  GAD 7 : Generalized Anxiety Score  Nervous, Anxious, on Edge '2 1 2 1  '$ Control/stop worrying '2 1 2 1  '$ Worry too much - different things '2 1 2 1  '$ Trouble relaxing '1 1 2 1  '$ Restless 0 0 0 1  Easily annoyed or irritable 2 0 0 1   Afraid - awful might happen '3 2 2 1  '$ Total GAD 7 Score '12 6 10 7     '$ Review of Systems:   Pertinent items are noted in HPI Denies any headaches, blurred vision, fatigue, shortness of breath, chest pain, abdominal pain, abnormal vaginal discharge/itching/odor/irritation, problems with periods, bowel movements, urination, or intercourse unless otherwise stated above. Pertinent History Reviewed:  Reviewed past medical,surgical, social and family history.  Reviewed problem list, medications and allergies. Physical Assessment:   Vitals:   07/19/22 0843  BP: 139/84  Pulse: 72  Weight: 166 lb (75.3 kg)  Height: '5\' 2"'$  (1.575 m)  Body mass index is 30.36 kg/m.        Physical Examination:   General appearance - well appearing, and in no distress  Mental status - alert, oriented to person, place, and time  Psych:  She has a normal mood and affect  Skin - warm and dry, normal color, no suspicious lesions noted  Chest - effort normal, all lung fields clear to auscultation bilaterally  Heart - normal rate and regular rhythm  Neck:  midline trachea, no  thyromegaly or nodules  Breasts - breasts appear normal, no suspicious masses, no skin or nipple changes or  axillary nodes  Abdomen - soft, nontender, nondistended, no masses or organomegaly  Pelvic - VULVA: normal appearing vulva with no tenderness or lesions. ~1.5cm fleshy soft skin tag Lt lower vulva, has been there for years, doesn't bother her- declines removal  VAGINA: normal appearing vagina with normal color and discharge, no lesions  CERVIX: normal appearing cervix without discharge or lesions, no CMT  Thin prep pap is not done   UTERUS: uterus is felt to be normal size, shape, consistency and nontender   ADNEXA: No adnexal masses or tenderness noted.  Extremities:  No swelling or varicosities noted  Chaperone: Latisha Cresenzo    No results found for this or any previous visit (from the past 24 hour(s)).  Assessment & Plan:  1)  Well-Woman Exam  2) Some urge incontinence> after drive home, doesn't drink caffeine ater 1000, mainly water throughout the day. Declines treatment right now, will let us know if changes her mind  3) Vulvar skin tag> for years, no changes, declines removal  Labs/procedures today: exam  Mammogram:  April 2024 , or sooner if problems Colonoscopy:  2026 (per last TCS) , or sooner if problems  No orders of the defined types were placed in this encounter.   Meds: No orders of the defined types were placed in this encounter.   Follow-up: Return in about 1 year (around 07/20/2023) for Pap & physical.  Roma Schanz CNM, [facility] 07/19/2022 9:08 AM

## 2022-07-26 ENCOUNTER — Telehealth: Payer: Self-pay

## 2022-07-26 ENCOUNTER — Other Ambulatory Visit: Payer: Self-pay

## 2022-07-26 DIAGNOSIS — E1159 Type 2 diabetes mellitus with other circulatory complications: Secondary | ICD-10-CM

## 2022-07-26 DIAGNOSIS — E559 Vitamin D deficiency, unspecified: Secondary | ICD-10-CM

## 2022-07-26 DIAGNOSIS — E785 Hyperlipidemia, unspecified: Secondary | ICD-10-CM

## 2022-07-26 NOTE — Telephone Encounter (Signed)
Patient aware labs ordered  

## 2022-07-26 NOTE — Telephone Encounter (Signed)
Patient called asked can Vitamin D be order for blood work, does she need any other labs done please contact patient [phone].

## 2022-07-27 ENCOUNTER — Other Ambulatory Visit (HOSPITAL_COMMUNITY)
Admission: RE | Admit: 2022-07-27 | Discharge: 2022-07-27 | Disposition: A | Discharge status: Home / Self Care | Payer: 59 | Source: Ambulatory Visit | Attending: Family Medicine | Admitting: Family Medicine

## 2022-07-27 DIAGNOSIS — E1159 Type 2 diabetes mellitus with other circulatory complications: Secondary | ICD-10-CM | POA: Insufficient documentation

## 2022-07-27 DIAGNOSIS — E785 Hyperlipidemia, unspecified: Secondary | ICD-10-CM | POA: Diagnosis not present

## 2022-07-27 DIAGNOSIS — E559 Vitamin D deficiency, unspecified: Secondary | ICD-10-CM | POA: Diagnosis not present

## 2022-07-27 LAB — COMPREHENSIVE METABOLIC PANEL
ALT: 19 U/L (ref 0–44)
AST: 19 U/L (ref 15–41)
Albumin: 4.3 g/dL (ref 3.5–5.0)
Alkaline Phosphatase: 66 U/L (ref 38–126)
Anion gap: 9 (ref 5–15)
BUN: 11 mg/dL (ref 8–23)
CO2: 25 mmol/L (ref 22–32)
Calcium: 8.9 mg/dL (ref 8.9–10.3)
Chloride: 101 mmol/L (ref 98–111)
Creatinine, Ser: 0.69 mg/dL (ref 0.44–1.00)
GFR, Estimated: >60 mL/min (ref >60)
Glucose, Bld: 111 mg/dL — ABNORMAL HIGH (ref 70–99)
Potassium: 4.2 mmol/L (ref 3.5–5.1)
Sodium: 135 mmol/L (ref 135–145)
Total Bilirubin: 0.7 mg/dL (ref 0.3–1.2)
Total Protein: 7.4 g/dL (ref 6.5–8.1)

## 2022-07-27 LAB — LIPID PANEL
Cholesterol: 141 mg/dL (ref 0–200)
HDL: 52 mg/dL (ref >40)
LDL Cholesterol: 67 mg/dL (ref 0–99)
Total CHOL/HDL Ratio: 2.7 RATIO
Triglycerides: 111 mg/dL (ref <150)
VLDL: 22 mg/dL (ref 0–40)

## 2022-07-27 LAB — VITAMIN D 25 HYDROXY (VIT D DEFICIENCY, FRACTURES): Vit D, 25-Hydroxy: 44.68 ng/mL (ref 30–100)

## 2022-07-31 ENCOUNTER — Other Ambulatory Visit: Payer: Self-pay | Admitting: Family Medicine

## 2022-08-01 ENCOUNTER — Other Ambulatory Visit (HOSPITAL_COMMUNITY): Payer: Self-pay

## 2022-08-01 MED ORDER — PANTOPRAZOLE SODIUM 40 MG PO TBEC
DELAYED_RELEASE_TABLET | Freq: Every day | ORAL | 3 refills | Status: DC
  Filled 2022-08-01: qty 90, 90d supply, fill #0
  Filled 2022-11-01: qty 90, 90d supply, fill #1
  Filled 2022-12-12 – 2023-01-29 (×2): qty 90, 90d supply, fill #2
  Filled 2023-04-24: qty 90, 90d supply, fill #3

## 2022-08-03 ENCOUNTER — Telehealth: Payer: Self-pay | Admitting: Family Medicine

## 2022-08-03 ENCOUNTER — Encounter: Payer: Self-pay | Admitting: Family Medicine

## 2022-08-03 ENCOUNTER — Ambulatory Visit: Payer: 59 | Admitting: Family Medicine

## 2022-08-03 VITALS — BP 114/72 | HR 66 | Ht 61.0 in | Wt 167.0 lb

## 2022-08-03 DIAGNOSIS — I10 Essential (primary) hypertension: Secondary | ICD-10-CM | POA: Diagnosis not present

## 2022-08-03 DIAGNOSIS — E1159 Type 2 diabetes mellitus with other circulatory complications: Secondary | ICD-10-CM

## 2022-08-03 DIAGNOSIS — E785 Hyperlipidemia, unspecified: Secondary | ICD-10-CM

## 2022-08-03 DIAGNOSIS — R002 Palpitations: Secondary | ICD-10-CM | POA: Diagnosis not present

## 2022-08-03 DIAGNOSIS — K219 Gastro-esophageal reflux disease without esophagitis: Secondary | ICD-10-CM | POA: Diagnosis not present

## 2022-08-03 DIAGNOSIS — Z9189 Other specified personal risk factors, not elsewhere classified: Secondary | ICD-10-CM

## 2022-08-03 DIAGNOSIS — F411 Generalized anxiety disorder: Secondary | ICD-10-CM | POA: Diagnosis not present

## 2022-08-03 DIAGNOSIS — E669 Obesity, unspecified: Secondary | ICD-10-CM

## 2022-08-03 NOTE — Assessment & Plan Note (Signed)
Controlled, no change in medication DASH diet and commitment to daily physical activity for a minimum of 30 minutes discussed and encouraged, as a part of hypertension management. The importance of attaining a healthy weight is also discussed.     08/03/2022    8:35 AM 07/19/2022    8:43 AM 04/18/2022    8:19 AM 12/29/2021   11:15 AM 12/28/2021    8:34 AM 12/07/2021    8:31 AM 08/24/2021    8:38 AM  BP/Weight  Systolic BP 707 867  544 920 100 712  Diastolic BP 72 84  90 72 75 82  Wt. (Lbs) 167.04 166 160 166 165.12 169.4 187  BMI 31.56 kg/m2 30.36 kg/m2 30.23 kg/m2 31.37 kg/m2 31.2 kg/m2 30.98 kg/m2 34.2 kg/m2

## 2022-08-03 NOTE — Assessment & Plan Note (Signed)
Controlled, no change in medication Alicia Neal is reminded of the importance of commitment to daily physical activity for 30 minutes or more, as able and the need to limit carbohydrate intake to 30 to 60 grams per meal to help with blood sugar control.   The need to take medication as prescribed, test blood sugar as directed, and to call between visits if there is a concern that blood sugar is uncontrolled is also discussed.   Alicia Neal is reminded of the importance of daily foot exam, annual eye examination, and good blood sugar, blood pressure and cholesterol control.     Latest Ref Rng & Units 07/27/2022    9:13 AM 04/21/2022    9:06 AM 12/28/2021    9:16 AM 12/07/2021    8:46 AM 08/05/2021    8:44 AM  Diabetic Labs  HbA1c 4.8 - 5.6 %  5.9   6.3  9.4   Chol 0 - 200 mg/dL 141   177     HDL >40 mg/dL 52   57     Calc LDL 0 - 99 mg/dL 67   96     Triglycerides <150 mg/dL 111   139     Creatinine 0.44 - 1.00 mg/dL 0.69   0.75         08/03/2022    8:35 AM 07/19/2022    8:43 AM 04/18/2022    8:19 AM 12/29/2021   11:15 AM 12/28/2021    8:34 AM 12/07/2021    8:31 AM 08/24/2021    8:38 AM  BP/Weight  Systolic BP 378 588  502 774 128 786  Diastolic BP 72 84  90 72 75 82  Wt. (Lbs) 167.04 166 160 166 165.12 169.4 187  BMI 31.56 kg/m2 30.36 kg/m2 30.23 kg/m2 31.37 kg/m2 31.2 kg/m2 30.98 kg/m2 34.2 kg/m2      Latest Ref Rng & Units 08/03/2022    8:20 AM 03/08/2022   12:00 AM  Foot/eye exam completion dates  Eye Exam No Retinopathy  No Retinopathy      Foot Form Completion  Done      This result is from an external source.

## 2022-08-03 NOTE — Assessment & Plan Note (Signed)
Controlled, no change in medication DASH diet and commitment to daily physical activity for a minimum of 30 minutes discussed and encouraged, as a part of hypertension management. The importance of attaining a healthy weight is also discussed.     08/03/2022    8:35 AM 07/19/2022    8:43 AM 04/18/2022    8:19 AM 12/29/2021   11:15 AM 12/28/2021    8:34 AM 12/07/2021    8:31 AM 08/24/2021    8:38 AM  BP/Weight  Systolic BP 956 387  564 332 951 884  Diastolic BP 72 84  90 72 75 82  Wt. (Lbs) 167.04 166 160 166 165.12 169.4 187  BMI 31.56 kg/m2 30.36 kg/m2 30.23 kg/m2 31.37 kg/m2 31.2 kg/m2 30.98 kg/m2 34.2 kg/m2

## 2022-08-03 NOTE — Assessment & Plan Note (Signed)
Reduce med to alternate day, hoping to reduce dosage

## 2022-08-03 NOTE — Patient Instructions (Addendum)
F/u in 4 months, call if you eed me sooner  Microalb today  Normal foot exam   Try to reduce reflux med to every other day, as we try to lower dose  Anticipate lower dose mounjaro in the new year  Continue great health habits   You are referred to Cardiology for evaluation  Non fasting cBC, TSH, ccmpo and eGFR and HBA1c 5 days before next appointment  Thanks for choosing [facility], we consider it a privelige to serve you.

## 2022-08-03 NOTE — Assessment & Plan Note (Signed)
  Patient re-educated about  the importance of commitment to a  minimum of 150 minutes of exercise per week as able.  The importance of healthy food choices with portion control discussed, as well as eating regularly and within a 12 hour window most days. The need to choose "clean , green" food 50 to 75% of the time is discussed, as well as to make water the primary drink and set a goal of 64 ounces water daily.       08/03/2022    8:35 AM 07/19/2022    8:43 AM 04/18/2022    8:19 AM  Weight /BMI  Weight 167 lb 0.6 oz 166 lb 160 lb  Height '5\' 1"'$  (1.549 m) '5\' 2"'$  (1.575 m)   BMI 31.56 kg/m2 30.36 kg/m2 30.23 kg/m2

## 2022-08-03 NOTE — Assessment & Plan Note (Signed)
Improved, continue daily celexa, and using buspar as needed when very anxious, may reduce early am palpitations

## 2022-08-03 NOTE — Progress Notes (Signed)
Alicia Neal     MRN: [id number]      DOB: [birth year]   HPI Alicia Neal is here for follow up and re-evaluation of chronic medical conditions, medication management and review of any available recent lab and radiology data.  Preventive health is updated, specifically  Cancer screening and Immunization.   Questions or concerns regarding consultations or procedures which the PT has had in the interim are  addressed. The PT denies any adverse reactions to current medications since the last visit.  In July had episode of chest pain , vomiting , and SOb, thinksshe had a cornary event.  Awakened with anxiety and heart racing twice per month, sounds anxiety induced, takes buspar as needed , and does link her palpitations with her episodes of anxiety  ROS Denies recent fever or chills. Denies sinus pressure, nasal congestion, ear pain or sore throat. Denies chest congestion, productive cough or wheezing. Denies PND, orthopnea  and leg swelling Denies abdominal pain, nausea, vomiting,diarrhea or constipation.   Denies dysuria, frequency, hesitancy or incontinence. Denies joint pain, swelling and limitation in mobility. Denies headaches, seizures, numbness, or tingling. Denies depression, y or insomnia. Denies skin break down or rash.   PE  BP 114/72 (BP Location: Left Arm, Patient Position: Sitting, Cuff Size: Large)   Pulse 66   Ht '5\' 1"'$  (1.549 m)   Wt 167 lb 0.6 oz (75.8 kg)   SpO2 97%   BMI 31.56 kg/m   Patient alert and oriented and in no cardiopulmonary distress.  HEENT: No facial asymmetry, EOMI,     Neck supple .  Chest: Clear to auscultation bilaterally.  CVS: S1, S2 no murmurs, no S3.Regular rate.  ABD: Soft non tender.   Ext: No edema  MS: Adequate ROM spine, shoulders, hips and knees.  Skin: Intact, no ulcerations or rash noted.  Psych: Good eye contact, normal affect. Memory intact not anxious or depressed appearing.  CNS: CN 2-12 intact, power,  normal  throughout.no focal deficits noted.   Assessment & Plan  Essential hypertension, benign Controlled, no change in medication DASH diet and commitment to daily physical activity for a minimum of 30 minutes discussed and encouraged, as a part of hypertension management. The importance of attaining a healthy weight is also discussed.     08/03/2022    8:35 AM 07/19/2022    8:43 AM 04/18/2022    8:19 AM 12/29/2021   11:15 AM 12/28/2021    8:34 AM 12/07/2021    8:31 AM 08/24/2021    8:38 AM  BP/Weight  Systolic BP 517 616  073 710 626 948  Diastolic BP 72 84  90 72 75 82  Wt. (Lbs) 167.04 166 160 166 165.12 169.4 187  BMI 31.56 kg/m2 30.36 kg/m2 30.23 kg/m2 31.37 kg/m2 31.2 kg/m2 30.98 kg/m2 34.2 kg/m2       Type 2 diabetes mellitus with vascular disease (HCC) Controlled, no change in medication Alicia Neal is reminded of the importance of commitment to daily physical activity for 30 minutes or more, as able and the need to limit carbohydrate intake to 30 to 60 grams per meal to help with blood sugar control.   The need to take medication as prescribed, test blood sugar as directed, and to call between visits if there is a concern that blood sugar is uncontrolled is also discussed.   Alicia Neal is reminded of the importance of daily foot exam, annual eye examination, and good blood sugar, blood pressure and cholesterol control.  Latest Ref Rng & Units 07/27/2022    9:13 AM 04/21/2022    9:06 AM 12/28/2021    9:16 AM 12/07/2021    8:46 AM 08/05/2021    8:44 AM  Diabetic Labs  HbA1c 4.8 - 5.6 %  5.9   6.3  9.4   Chol 0 - 200 mg/dL 141   177     HDL >40 mg/dL 52   57     Calc LDL 0 - 99 mg/dL 67   96     Triglycerides <150 mg/dL 111   139     Creatinine 0.44 - 1.00 mg/dL 0.69   0.75         08/03/2022    8:35 AM 07/19/2022    8:43 AM 04/18/2022    8:19 AM 12/29/2021   11:15 AM 12/28/2021    8:34 AM 12/07/2021    8:31 AM 08/24/2021    8:38 AM  BP/Weight  Systolic BP 161 096  045  409 811 914  Diastolic BP 72 84  90 72 75 82  Wt. (Lbs) 167.04 166 160 166 165.12 169.4 187  BMI 31.56 kg/m2 30.36 kg/m2 30.23 kg/m2 31.37 kg/m2 31.2 kg/m2 30.98 kg/m2 34.2 kg/m2      Latest Ref Rng & Units 08/03/2022    8:20 AM 03/08/2022   12:00 AM  Foot/eye exam completion dates  Eye Exam No Retinopathy  No Retinopathy      Foot Form Completion  Done      This result is from an external source.        HTN, goal below 130/80 Controlled, no change in medication DASH diet and commitment to daily physical activity for a minimum of 30 minutes discussed and encouraged, as a part of hypertension management. The importance of attaining a healthy weight is also discussed.     08/03/2022    8:35 AM 07/19/2022    8:43 AM 04/18/2022    8:19 AM 12/29/2021   11:15 AM 12/28/2021    8:34 AM 12/07/2021    8:31 AM 08/24/2021    8:38 AM  BP/Weight  Systolic BP 782 956  213 086 578 469  Diastolic BP 72 84  90 72 75 82  Wt. (Lbs) 167.04 166 160 166 165.12 169.4 187  BMI 31.56 kg/m2 30.36 kg/m2 30.23 kg/m2 31.37 kg/m2 31.2 kg/m2 30.98 kg/m2 34.2 kg/m2       Hyperlipidemia LDL goal <100 Hyperlipidemia:Low fat diet discussed and encouraged.   Lipid Panel  Lab Results  Component Value Date   CHOL 141 07/27/2022   HDL 52 07/27/2022   LDLCALC 67 07/27/2022   TRIG 111 07/27/2022   CHOLHDL 2.7 07/27/2022     Controlled, no change in medication   Mildly obese  Patient re-educated about  the importance of commitment to a  minimum of 150 minutes of exercise per week as able.  The importance of healthy food choices with portion control discussed, as well as eating regularly and within a 12 hour window most days. The need to choose "clean , green" food 50 to 75% of the time is discussed, as well as to make water the primary drink and set a goal of 64 ounces water daily.       08/03/2022    8:35 AM 07/19/2022    8:43 AM 04/18/2022    8:19 AM  Weight /BMI  Weight 167 lb 0.6 oz 166 lb  160 lb  Height '5\' 1"'$  (1.549 m) '5\' 2"'$  (1.575 m)  BMI 31.56 kg/m2 30.36 kg/m2 30.23 kg/m2       GERD (gastroesophageal reflux disease) Reduce med to alternate day, hoping to reduce dosage  GAD (generalized anxiety disorder) Improved, continue daily celexa, and using buspar as needed when very anxious, may reduce early am palpitations  At increased risk for cardiovascular disease HTN, hyperlipidemia and diabetes and recent event concerning for potential  CV event refer to Cardiology

## 2022-08-03 NOTE — Telephone Encounter (Signed)
Needs HBA1C/ glycoHb in office first week in October as she will have her diabetic care with this office moving forward, pls arrange this with her Thanks ?? Please ask

## 2022-08-03 NOTE — Assessment & Plan Note (Signed)
HTN, hyperlipidemia and diabetes and recent event concerning for potential  CV event refer to Cardiology

## 2022-08-03 NOTE — Assessment & Plan Note (Signed)
Hyperlipidemia:Low fat diet discussed and encouraged.   Lipid Panel  Lab Results  Component Value Date   CHOL 141 07/27/2022   HDL 52 07/27/2022   LDLCALC 67 07/27/2022   TRIG 111 07/27/2022   CHOLHDL 2.7 07/27/2022     Controlled, no change in medication

## 2022-08-04 ENCOUNTER — Other Ambulatory Visit: Payer: Self-pay

## 2022-08-04 ENCOUNTER — Telehealth: Payer: Self-pay | Admitting: Family Medicine

## 2022-08-04 DIAGNOSIS — I10 Essential (primary) hypertension: Secondary | ICD-10-CM

## 2022-08-04 NOTE — Telephone Encounter (Signed)
Pt returning call

## 2022-08-04 NOTE — Telephone Encounter (Signed)
LMTRC

## 2022-08-05 LAB — MICROALBUMIN / CREATININE URINE RATIO
Creatinine, Urine: 18.9 mg/dL
Microalb/Creat Ratio: <16 mg/g creat (ref 0–29)
Microalbumin, Urine: <3 ug/mL

## 2022-08-05 NOTE — Telephone Encounter (Signed)
See other tele msg  

## 2022-08-06 NOTE — Progress Notes (Addendum)
Cardiology Office Note  Date: 08/10/2022   ID: Alicia Neal, Alicia Neal 08-07-1961, MRN [id number]  PCP:  Fayrene Helper, MD  Cardiologist:  Chalmers Guest, MD Electrophysiologist:  None   Reason for Office Visit: No chief complaint on file.   History of Present Illness: Patient is a 61 year old female known to have hypertension, diabetes, hyperlipidemia is here for a follow-up visit.  Patient was earlier referred to Dr. Bronson Ing in 2019 for coronary calcifications when she underwent noncoronary CT scan and also for elevated LDL. Plan was, aggressive risk factor modification and noninvasive cardiac testing only if the patient is symptomatic. Thereafter, patient was discharged from the cardiology clinic to return back as needed. She is referred back to cardiology for 1 episode of chest heaviness in July 2023 when she was on the boat. She referred for new patient visit. Patient reported 1 episode of nausea vomiting when she was on boat in hot summer July 2023 associated with chest heaviness and shortness of breath.  The whole episode lasted for 5 minutes and resolved spontaneously.  She was hence sent by the PCP to cardiology for further evaluation.  At baseline, patient brisk walks for 2 miles with no angina or dyspnea on exertion. She also can climb 4 flights of stairs with no chest heaviness but has some shortness of breath which is usual for her age.  Otherwise denied any lightheadedness, dizziness, lower extremity swelling, palpitations.  Denies working cigarettes and drinks alcohol on social occasions. Compliant with medications and no medication side effects. Compliant with Repatha.  Her LDLs are very well controlled.  Past Medical History:  Diagnosis Date   Adrenal adenoma    right side   Allergic rhinitis    Allergy    cat,dog,animal dander   Anxiety disorder    Carpal tunnel syndrome of left wrist    had surgery to repair   Chronic depression    Dr. Cheryln Manly   GERD  (gastroesophageal reflux disease)    History of abnormal cervical Pap smear    History of adenomatous polyp of colon    History of hiatal hernia    Hyperlipidemia    Hypertension    IBS (irritable bowel syndrome)    OSA (obstructive sleep apnea)    per pt study yrs ago-- CPAP intolerant    Pemphigoid, cicatricial    pruritic autoimmune blistering skin disorder   PONV (postoperative nausea and vomiting)    Pulmonary nodule, right pulmologist-  dr Nathaneil Canary mcquaid   x2    per CT 09-11-2015   Trigger finger, left    ring finger   Type 2 diabetes mellitus ([place])    Uncontrolled type 2 diabetes mellitus with hyperglycemia ([place]) 04/17/2018   Wears glasses     Past Surgical History:  Procedure Laterality Date   CARDIOVASCULAR STRESS TEST  04-30-2003   Small focus of decreased perfusion on rest images in the mid-distal anterior wall (worrisome for ischemia),  normal LV function and wall motion , ef 60%   CARPAL TUNNEL RELEASE Left 11/26/2015   Procedure: CARPAL TUNNEL RELEASE;  Surgeon: Iran Planas, MD;  Location: [place];  Service: Orthopedics;  Laterality: Left;   CHOLECYSTECTOMY  1989   COLONOSCOPY  11/04/2009   POLYPECTOMY     TRANSTHORACIC ECHOCARDIOGRAM  04-30-2003   mild hypokinetic in the inferior and posterior wall, ef 50%/  trivial MR /  mild TR   TRIGGER FINGER RELEASE Left 11/26/2015   Procedure: RELEASE TRIGGER FINGER/A-1 PULLEY LEFT RING  FINGER;  Surgeon: Iran Planas, MD;  Location: [place];  Service: Orthopedics;  Laterality: Left;   TUBAL LIGATION  2000    Current Outpatient Medications  Medication Sig Dispense Refill   amLODipine (NORVASC) 10 MG tablet TAKE 1 TABLET BY MOUTH DAILY 90 tablet 1   blood glucose meter kit and supplies Dispense Freestyle meter and supplies for once daily testing dx e11.9 1 each 0   Cholecalciferol (VITAMIN D) 125 MCG (5000 UT) CAPS TAKE 1 CAPSULE BY MOUTH ONCE A DAY 100 capsule 0   citalopram (CELEXA) 20 MG tablet TAKE 1 TABLET (20 MG TOTAL)  BY MOUTH DAILY. 90 tablet 3   Evolocumab (REPATHA SURECLICK) 106 MG/ML SOAJ INJECT 140 MG INTO THE SKIN EVERY 14 DAYS 2 mL 2   glucose blood test strip Use to check blood sugar twice daily.   Accu-Chek Guide Test Strip 100 each 2   ibuprofen (ADVIL) 200 MG tablet Take 800 mg by mouth as needed.     loratadine (CLARITIN) 10 MG tablet Take 10 mg by mouth as needed.     losartan (COZAAR) 100 MG tablet TAKE 1 TABLET BY MOUTH ONCE DAILY 90 tablet 3   pantoprazole (PROTONIX) 40 MG tablet TAKE 1 TABLET BY MOUTH ONCE DAILY 90 tablet 3   tirzepatide (MOUNJARO) 7.5 MG/0.5ML Pen Inject 7.5 mg into the skin once a week. 6 mL 3   No current facility-administered medications for this visit.   Allergies:  Ezetimibe-simvastatin, Pravachol [pravastatin sodium], Simvastatin, Statins, and Zetia [ezetimibe]   Social History: The patient  reports that she has quit smoking. Her smoking use included cigarettes. She has a 10.00 pack-year smoking history. She has never used smokeless tobacco. She reports current alcohol use. She reports that she does not use drugs.   Family History: The patient's family history includes Arthritis in her mother; COPD in her maternal grandfather; Cancer in her brother, father, maternal grandfather, and maternal grandmother; Diabetes in her father; Hypertension in her paternal grandmother; Lung cancer in her father; Stroke in her paternal grandmother.   ROS:  Please see the history of present illness. Otherwise, complete review of systems is positive for none.  All other systems are reviewed and negative.   Physical Exam: VS:  BP 126/78   Pulse 64   Ht $R'5\' 2"'EA$  (1.575 m)   Wt 168 lb 12.8 oz (76.6 kg)   SpO2 100%   BMI 30.87 kg/m , BMI Body mass index is 30.87 kg/m.  Wt Readings from Last 3 Encounters:  08/10/22 168 lb 12.8 oz (76.6 kg)  08/03/22 167 lb 0.6 oz (75.8 kg)  07/19/22 166 lb (75.3 kg)    General: Patient appears comfortable at rest. HEENT: Conjunctiva and lids normal,  oropharynx clear with moist mucosa. Neck: Supple, no elevated JVP or carotid bruits, no thyromegaly. Lungs: Clear to auscultation, nonlabored breathing at rest. Cardiac: Regular rate and rhythm, Grade 2-3 holosystolic murmur Abdomen: Soft, nontender, no hepatomegaly, bowel sounds present, no guarding or rebound. Extremities: No pitting edema, distal pulses 2+. Skin: Warm and dry. Musculoskeletal: No kyphosis. Neuropsychiatric: Alert and oriented x3, affect grossly appropriate.  ECG:  An ECG dated today was personally reviewed today and demonstrated:  NSR, No ST-T changes  Recent Labwork: 12/28/2021: Hemoglobin 13.4; Platelets 439 07/27/2022: ALT 19; AST 19; BUN 11; Creatinine, Ser 0.69; Potassium 4.2; Sodium 135     Component Value Date/Time   CHOL 141 07/27/2022 0913   CHOL 177 12/28/2021 0916   TRIG 111 07/27/2022 0913  HDL 52 07/27/2022 0913   HDL 57 12/28/2021 0916   CHOLHDL 2.7 07/27/2022 0913   VLDL 22 07/27/2022 0913   LDLCALC 67 07/27/2022 0913   LDLCALC 96 12/28/2021 0916    Other Studies Reviewed Today:  STRESS ECHO in 2011 Stress ECG: There were no stress arrhythmias or conduction   abnormalities. Insignificant upsloping ST segment depression   present. The stress ECG was negative for ischemia.    --------------------------------------------------------------------   Baseline:    - LV size was normal.   - LV global systolic function was normal. The estimated LV ejection     fraction was 55%. The left atrium, mitral and aortic valves, LV     size and LV function are normal. Mild LVH is present.   - Normal wall motion; no LV regional wall motion abnormalities.   Immediate post stress; 2.43mph, 12degrees; 33mets; 3 min:    - LV size was normal and unchanged from baseline.   - LV global systolic function was normal and appropriately augmented     from baseline. The estimated LV ejection fraction was 65%.   - Normal wall motion; no LV regional wall motion  abnormalities.   - No evidence for new LV regional wall motion abnormalities.    --------------------------------------------------------------------   Stress echo results: Left ventricular ejection fraction was normal   at rest and with stress. There was no echocardiographic evidence for   stress-induced ischemia.   Assessment and Plan: Patient is a 61 year old female known to have hypertension, hyperlipidemia and diabetes presented to the cardiology clinic with one episode of chest heaviness.   #Coronary artery calcifications in 2019 #Chest heaviness Plan Patient had 1 episode of isolated chest heaviness in July 2023 associated with N&V and SOB while she was boating. She is physically active at baseline. In the absence of angina with exertion, I would not recommend any noninvasive cardiac testing.  #Hyperlipidemia Plan Patient has been intolerant to moderate intensity statins. Started on [place] by non-cardiology provider after which her LDLs significantly decreased. Continue Repatha.  #Systolic murmur PLAN Obtain 2D Echo  #Hypertension Plan Continue current antihypertensives, Amlodipine and Losartan  Medication Adjustments/Labs and Tests Ordered: Current medicines are reviewed at length with the patient today.  Concerns regarding medicines are outlined above.   Tests Ordered: 2D Echo   Medication Changes: No orders of the defined types were placed in this encounter.   Disposition:  Follow up prn  Signed, Hanh Kertesz Fidel Levy, MD, 08/10/2022 3:12 PM    [facility] at [facility] [address] [place], [place] [address]

## 2022-08-08 NOTE — Telephone Encounter (Signed)
Sent mychart message that lab was at [facility]

## 2022-08-10 ENCOUNTER — Encounter: Payer: Self-pay | Admitting: Internal Medicine

## 2022-08-10 ENCOUNTER — Ambulatory Visit: Payer: 59 | Attending: Internal Medicine | Admitting: Internal Medicine

## 2022-08-10 VITALS — BP 126/78 | HR 64 | Ht 62.0 in | Wt 168.8 lb

## 2022-08-10 DIAGNOSIS — E785 Hyperlipidemia, unspecified: Secondary | ICD-10-CM

## 2022-08-10 DIAGNOSIS — R011 Cardiac murmur, unspecified: Secondary | ICD-10-CM | POA: Diagnosis not present

## 2022-08-10 NOTE — Patient Instructions (Signed)
Medication Instructions:  Your physician recommends that you continue on your current medications as directed. Please refer to the Current Medication list given to you today.   Labwork: None today  Testing/Procedures: Your physician has requested that you have an echocardiogram. Echocardiography is a painless test that uses sound waves to create images of your heart. It provides your doctor with information about the size and shape of your heart and how well your heart's chambers and valves are working. This procedure takes approximately one hour. There are no restrictions for this procedure.   Follow-Up: As needed  Any Other Special Instructions Will Be Listed Below (If Applicable).  If you need a refill on your cardiac medications before your next appointment, please call your pharmacy.    Thank you for choosing [facility] !

## 2022-08-11 ENCOUNTER — Other Ambulatory Visit (HOSPITAL_COMMUNITY)
Admission: RE | Admit: 2022-08-11 | Discharge: 2022-08-11 | Disposition: A | Discharge status: Home / Self Care | Payer: 59 | Source: Ambulatory Visit | Attending: Family Medicine | Admitting: Family Medicine

## 2022-08-11 DIAGNOSIS — I10 Essential (primary) hypertension: Secondary | ICD-10-CM | POA: Diagnosis not present

## 2022-08-11 DIAGNOSIS — E1159 Type 2 diabetes mellitus with other circulatory complications: Secondary | ICD-10-CM | POA: Diagnosis not present

## 2022-08-11 LAB — COMPREHENSIVE METABOLIC PANEL
ALT: 18 U/L (ref 0–44)
AST: 18 U/L (ref 15–41)
Albumin: 4.4 g/dL (ref 3.5–5.0)
Alkaline Phosphatase: 62 U/L (ref 38–126)
Anion gap: 8 (ref 5–15)
BUN: 15 mg/dL (ref 8–23)
CO2: 27 mmol/L (ref 22–32)
Calcium: 9.1 mg/dL (ref 8.9–10.3)
Chloride: 101 mmol/L (ref 98–111)
Creatinine, Ser: 0.71 mg/dL (ref 0.44–1.00)
GFR, Estimated: >60 mL/min (ref >60)
Glucose, Bld: 117 mg/dL — ABNORMAL HIGH (ref 70–99)
Potassium: 3.9 mmol/L (ref 3.5–5.1)
Sodium: 136 mmol/L (ref 135–145)
Total Bilirubin: 0.7 mg/dL (ref 0.3–1.2)
Total Protein: 7.6 g/dL (ref 6.5–8.1)

## 2022-08-11 LAB — CBC
HCT: 42 % (ref 36.0–46.0)
Hemoglobin: 13.9 g/dL (ref 12.0–15.0)
MCH: 30.7 pg (ref 26.0–34.0)
MCHC: 33.1 g/dL (ref 30.0–36.0)
MCV: 92.7 fL (ref 80.0–100.0)
Platelets: 386 10*3/uL (ref 150–400)
RBC: 4.53 MIL/uL (ref 3.87–5.11)
RDW: 12.5 % (ref 11.5–15.5)
WBC: 7.2 10*3/uL (ref 4.0–10.5)
nRBC: 0 % (ref 0.0–0.2)

## 2022-08-11 LAB — HEMOGLOBIN A1C
Hgb A1c MFr Bld: 6.3 % — ABNORMAL HIGH (ref 4.8–5.6)
Mean Plasma Glucose: 134.11 mg/dL

## 2022-08-11 LAB — TSH: TSH: 1.891 u[IU]/mL (ref 0.350–4.500)

## 2022-08-18 ENCOUNTER — Ambulatory Visit: Payer: 59 | Admitting: Nurse Practitioner

## 2022-08-22 ENCOUNTER — Ambulatory Visit (HOSPITAL_COMMUNITY): Admission: RE | Admit: 2022-08-22 | Payer: 59 | Source: Ambulatory Visit

## 2022-08-29 ENCOUNTER — Other Ambulatory Visit: Payer: Self-pay | Admitting: Family Medicine

## 2022-08-30 ENCOUNTER — Other Ambulatory Visit (HOSPITAL_COMMUNITY): Payer: Self-pay

## 2022-08-30 MED ORDER — LOSARTAN POTASSIUM 100 MG PO TABS
100.0000 mg | ORAL_TABLET | Freq: Every day | ORAL | 3 refills | Status: DC
  Filled 2022-08-30: qty 90, 90d supply, fill #0
  Filled 2022-11-24: qty 90, 90d supply, fill #1
  Filled 2022-12-12 – 2023-02-22 (×2): qty 90, 90d supply, fill #2
  Filled 2023-05-22: qty 90, 90d supply, fill #3

## 2022-09-10 ENCOUNTER — Other Ambulatory Visit: Payer: Self-pay | Admitting: Nurse Practitioner

## 2022-09-12 ENCOUNTER — Other Ambulatory Visit (HOSPITAL_COMMUNITY): Payer: Self-pay

## 2022-09-12 MED ORDER — REPATHA SURECLICK 140 MG/ML ~~LOC~~ SOAJ
140.0000 mg | SUBCUTANEOUS | 2 refills | Status: DC
  Filled 2022-09-12: qty 2, 28d supply, fill #0
  Filled 2022-10-14: qty 2, 28d supply, fill #1
  Filled 2022-11-14: qty 2, 28d supply, fill #2

## 2022-09-13 ENCOUNTER — Ambulatory Visit (HOSPITAL_COMMUNITY)
Admission: RE | Admit: 2022-09-13 | Discharge: 2022-09-13 | Disposition: A | Discharge status: Home / Self Care | Payer: 59 | Source: Ambulatory Visit | Attending: Internal Medicine | Admitting: Internal Medicine

## 2022-09-13 DIAGNOSIS — R011 Cardiac murmur, unspecified: Secondary | ICD-10-CM

## 2022-09-13 LAB — ECHOCARDIOGRAM COMPLETE
AR max vel: 1.83 cm2
AV Area VTI: 2.33 cm2
AV Area mean vel: 2.04 cm2
AV Mean grad: 4 mmHg
AV Peak grad: 9.6 mmHg
Ao pk vel: 1.55 m/s
Area-P 1/2: 2.65 cm2
MV VTI: 2.49 cm2
S' Lateral: 2.4 cm

## 2022-09-13 NOTE — Progress Notes (Signed)
*  PRELIMINARY RESULTS* Echocardiogram 2D Echocardiogram has been performed.  Alicia Neal 09/13/2022, 9:18 AM

## 2022-09-16 ENCOUNTER — Telehealth: Payer: Self-pay | Admitting: Internal Medicine

## 2022-09-16 NOTE — Telephone Encounter (Signed)
-----   Message from Chalmers Guest, MD sent at 09/16/2022 10:42 AM EST ----- Normal pumping function of the heart and no valvular abnormalities.

## 2022-09-16 NOTE — Telephone Encounter (Signed)
Patient notified and verbalized understanding. Patient had no questions or concerns at this time.  

## 2022-09-16 NOTE — Telephone Encounter (Signed)
Patient is returning call for results. Please advise  

## 2022-09-22 ENCOUNTER — Other Ambulatory Visit (HOSPITAL_COMMUNITY): Payer: Self-pay

## 2022-09-28 ENCOUNTER — Other Ambulatory Visit (HOSPITAL_COMMUNITY): Payer: Self-pay

## 2022-10-03 ENCOUNTER — Other Ambulatory Visit (HOSPITAL_COMMUNITY): Payer: Self-pay

## 2022-10-14 ENCOUNTER — Other Ambulatory Visit (HOSPITAL_COMMUNITY): Payer: Self-pay

## 2022-10-27 ENCOUNTER — Other Ambulatory Visit: Payer: Self-pay | Admitting: Family Medicine

## 2022-10-27 ENCOUNTER — Other Ambulatory Visit: Payer: Self-pay

## 2022-10-27 ENCOUNTER — Other Ambulatory Visit (HOSPITAL_COMMUNITY): Payer: Self-pay

## 2022-10-27 MED ORDER — CITALOPRAM HYDROBROMIDE 20 MG PO TABS
20.0000 mg | ORAL_TABLET | Freq: Every day | ORAL | 3 refills | Status: DC
  Filled 2022-10-27: qty 90, 90d supply, fill #0

## 2022-11-01 ENCOUNTER — Other Ambulatory Visit: Payer: Self-pay

## 2022-11-14 ENCOUNTER — Other Ambulatory Visit (HOSPITAL_COMMUNITY): Payer: Self-pay

## 2022-11-16 ENCOUNTER — Other Ambulatory Visit (HOSPITAL_COMMUNITY): Payer: Self-pay

## 2022-11-17 ENCOUNTER — Other Ambulatory Visit: Payer: Self-pay

## 2022-11-24 ENCOUNTER — Other Ambulatory Visit (HOSPITAL_COMMUNITY): Payer: Self-pay

## 2022-11-24 ENCOUNTER — Encounter: Payer: Self-pay | Admitting: Family Medicine

## 2022-11-24 ENCOUNTER — Other Ambulatory Visit: Payer: Self-pay

## 2022-11-25 ENCOUNTER — Other Ambulatory Visit: Payer: Self-pay

## 2022-11-25 ENCOUNTER — Other Ambulatory Visit (HOSPITAL_COMMUNITY): Admission: RE | Admit: 2022-11-25 | Payer: Commercial Managed Care - PPO | Source: Ambulatory Visit

## 2022-11-25 DIAGNOSIS — E1159 Type 2 diabetes mellitus with other circulatory complications: Secondary | ICD-10-CM

## 2022-11-25 NOTE — Telephone Encounter (Signed)
Pt called wanting to know if these lab orders can be sent to [facility] so she can have them done? She is there now.

## 2022-11-28 ENCOUNTER — Other Ambulatory Visit (HOSPITAL_COMMUNITY)
Admission: RE | Admit: 2022-11-28 | Discharge: 2022-11-28 | Disposition: A | Discharge status: Home / Self Care | Payer: Commercial Managed Care - PPO | Source: Ambulatory Visit | Attending: Family Medicine | Admitting: Family Medicine

## 2022-11-28 DIAGNOSIS — E1169 Type 2 diabetes mellitus with other specified complication: Secondary | ICD-10-CM | POA: Insufficient documentation

## 2022-11-28 DIAGNOSIS — E1151 Type 2 diabetes mellitus with diabetic peripheral angiopathy without gangrene: Secondary | ICD-10-CM | POA: Diagnosis present

## 2022-11-28 LAB — BASIC METABOLIC PANEL
Anion gap: 9 (ref 5–15)
BUN: 16 mg/dL (ref 8–23)
CO2: 26 mmol/L (ref 22–32)
Calcium: 8.8 mg/dL — ABNORMAL LOW (ref 8.9–10.3)
Chloride: 96 mmol/L — ABNORMAL LOW (ref 98–111)
Creatinine, Ser: 0.76 mg/dL (ref 0.44–1.00)
GFR, Estimated: >60 mL/min (ref >60)
Glucose, Bld: 126 mg/dL — ABNORMAL HIGH (ref 70–99)
Potassium: 3.8 mmol/L (ref 3.5–5.1)
Sodium: 131 mmol/L — ABNORMAL LOW (ref 135–145)

## 2022-11-28 LAB — HEMOGLOBIN A1C
Hgb A1c MFr Bld: 6.5 % — ABNORMAL HIGH (ref 4.8–5.6)
Mean Plasma Glucose: 139.85 mg/dL

## 2022-11-29 ENCOUNTER — Encounter: Payer: Self-pay | Admitting: Family Medicine

## 2022-11-29 ENCOUNTER — Ambulatory Visit (INDEPENDENT_AMBULATORY_CARE_PROVIDER_SITE_OTHER): Payer: Commercial Managed Care - PPO | Admitting: Family Medicine

## 2022-11-29 ENCOUNTER — Other Ambulatory Visit: Payer: Self-pay

## 2022-11-29 VITALS — BP 129/73 | HR 69 | Ht 62.0 in | Wt 169.1 lb

## 2022-11-29 DIAGNOSIS — E669 Obesity, unspecified: Secondary | ICD-10-CM | POA: Diagnosis not present

## 2022-11-29 DIAGNOSIS — E785 Hyperlipidemia, unspecified: Secondary | ICD-10-CM

## 2022-11-29 DIAGNOSIS — J309 Allergic rhinitis, unspecified: Secondary | ICD-10-CM | POA: Diagnosis not present

## 2022-11-29 DIAGNOSIS — I10 Essential (primary) hypertension: Secondary | ICD-10-CM | POA: Diagnosis not present

## 2022-11-29 DIAGNOSIS — E1159 Type 2 diabetes mellitus with other circulatory complications: Secondary | ICD-10-CM | POA: Diagnosis not present

## 2022-11-29 DIAGNOSIS — E782 Mixed hyperlipidemia: Secondary | ICD-10-CM

## 2022-11-29 MED ORDER — TIRZEPATIDE 10 MG/0.5ML ~~LOC~~ SOAJ
10.0000 mg | SUBCUTANEOUS | 1 refills | Status: DC
  Filled 2022-11-29 – 2023-01-10 (×6): qty 2, 28d supply, fill #0
  Filled 2023-01-20: qty 6, 84d supply, fill #0
  Filled 2023-01-23: qty 2, 28d supply, fill #0
  Filled 2023-02-15: qty 2, 28d supply, fill #1
  Filled 2023-03-18 – 2023-04-05 (×2): qty 2, 28d supply, fill #2

## 2022-11-29 NOTE — Patient Instructions (Addendum)
F/u in 4 months, call if you need me sooner  Higher dose of mounjaro effective today  Excellent labs, please commit to allergy meds  Fasting lipid, cmp and EGFr and HBA1c 3 to 5 days prior to next appt  It is important that you exercise regularly at least 30 minutes 5 times a week. If you develop chest pain, have severe difficulty breathing, or feel very tired, stop exercising immediately and seek medical attention    Please get Covid and RSV  vaccines  assoon as possiblr  Thanks for choosing [facility], we consider it a privelige to serve you.

## 2022-11-30 ENCOUNTER — Other Ambulatory Visit (HOSPITAL_COMMUNITY): Payer: Self-pay

## 2022-12-02 ENCOUNTER — Encounter (HOSPITAL_COMMUNITY): Payer: Self-pay

## 2022-12-02 ENCOUNTER — Ambulatory Visit (HOSPITAL_COMMUNITY)
Admission: RE | Admit: 2022-12-02 | Discharge: 2022-12-02 | Disposition: A | Discharge status: Home / Self Care | Payer: Commercial Managed Care - PPO | Source: Ambulatory Visit | Attending: Acute Care | Admitting: Acute Care

## 2022-12-02 DIAGNOSIS — Z87891 Personal history of nicotine dependence: Secondary | ICD-10-CM

## 2022-12-04 ENCOUNTER — Encounter: Payer: Self-pay | Admitting: Family Medicine

## 2022-12-04 NOTE — Assessment & Plan Note (Signed)
Controlled, inc mounjaro dose to assist with weight loss and lower HBa1C Alicia Neal is reminded of the importance of commitment to daily physical activity for 30 minutes or more, as able and the need to limit carbohydrate intake to 30 to 60 grams per meal to help with blood sugar control.   The need to take medication as prescribed, test blood sugar as directed, and to call between visits if there is a concern that blood sugar is uncontrolled is also discussed.   Alicia Neal is reminded of the importance of daily foot exam, annual eye examination, and good blood sugar, blood pressure and cholesterol control.     Latest Ref Rng & Units 11/28/2022    8:49 AM 11/28/2022    8:48 AM 08/11/2022    8:36 AM 08/03/2022    9:50 AM 07/27/2022    9:13 AM  Diabetic Labs  HbA1c 4.8 - 5.6 %  6.5  6.3     Micro/Creat Ratio 0 - 29 mg/g creat    <16    Chol 0 - 200 mg/dL     141   HDL >40 mg/dL     52   Calc LDL 0 - 99 mg/dL     67   Triglycerides <150 mg/dL     111   Creatinine 0.44 - 1.00 mg/dL 0.76   0.71   0.69       11/29/2022    8:24 AM 08/10/2022    2:44 PM 08/03/2022    8:35 AM 07/19/2022    8:43 AM 04/18/2022    8:19 AM 12/29/2021   11:15 AM 12/28/2021    8:34 AM  BP/Weight  Systolic BP 767 209 470 962  836 629  Diastolic BP 73 78 72 84  90 72  Wt. (Lbs) 169.12 168.8 167.04 166 160 166 165.12  BMI 30.93 kg/m2 30.87 kg/m2 31.56 kg/m2 30.36 kg/m2 30.23 kg/m2 31.37 kg/m2 31.2 kg/m2      Latest Ref Rng & Units 08/03/2022    8:20 AM 03/08/2022   12:00 AM  Foot/eye exam completion dates  Eye Exam No Retinopathy  No Retinopathy      Foot Form Completion  Done      This result is from an external source.

## 2022-12-04 NOTE — Assessment & Plan Note (Signed)
Hyperlipidemia:Low fat diet discussed and encouraged.   Lipid Panel  Lab Results  Component Value Date   CHOL 141 07/27/2022   HDL 52 07/27/2022   LDLCALC 67 07/27/2022   TRIG 111 07/27/2022   CHOLHDL 2.7 07/27/2022     Updated lab needed at/ before next visit.

## 2022-12-04 NOTE — Progress Notes (Signed)
Alicia Neal     MRN: [id number]      DOB: 09-May-1961   HPI Alicia Neal is here for follow up and re-evaluation of chronic medical conditions, medication management and review of any available recent lab and radiology data.  Preventive health is updated, specifically  Cancer screening and Immunization.   Questions or concerns regarding consultations or procedures which the PT has had in the interim are  addressed. The PT denies any adverse reactions to current medications since the last visit.  There are no new concerns.  Increased nasal congestion and drainage notes x 2 weeks Failure to lose weight frustrating slightly Denies polyuria, polydipsia, blurred vision , or hypoglycemic episodes.  ROS Denies recent fever or chills. Denies sinus pressure, nasal congestion, ear pain or sore throat. Denies chest congestion, productive cough or wheezing. Denies chest pains, palpitations and leg swelling Denies abdominal pain, nausea, vomiting,diarrhea or constipation.   Denies dysuria, frequency, hesitancy or incontinence. Denies joint pain, swelling and limitation in mobility. Denies headaches, seizures, numbness, or tingling. Denies depression, anxiety or insomnia. Denies skin break down or rash.   PE  BP 129/73 (BP Location: Right Arm, Patient Position: Sitting, Cuff Size: Normal)   Pulse 69   Ht '5\' 2"'$  (1.575 m)   Wt 169 lb 1.9 oz (76.7 kg)   SpO2 97%   BMI 30.93 kg/m   Patient alert and oriented and in no cardiopulmonary distress.  HEENT: No facial asymmetry, EOMI,     Neck supple .  Chest: Clear to auscultation bilaterally.  CVS: S1, S2 no murmurs, no S3.Regular rate.  ABD: Soft non tender.   Ext: No edema  MS: Adequate ROM spine, shoulders, hips and knees.  Skin: Intact, no ulcerations or rash noted.  Psych: Good eye contact, normal affect. Memory intact not anxious or depressed appearing.  CNS: CN 2-12 intact, power,  normal throughout.no focal deficits  noted.   Assessment & Plan  HTN, goal below 130/80 Controlled, no change in medication DASH diet and commitment to daily physical activity for a minimum of 30 minutes discussed and encouraged, as a part of hypertension management. The importance of attaining a healthy weight is also discussed.     11/29/2022    8:24 AM 08/10/2022    2:44 PM 08/03/2022    8:35 AM 07/19/2022    8:43 AM 04/18/2022    8:19 AM 12/29/2021   11:15 AM 12/28/2021    8:34 AM  BP/Weight  Systolic BP 629 476 546 503  546 568  Diastolic BP 73 78 72 84  90 72  Wt. (Lbs) 169.12 168.8 167.04 166 160 166 165.12  BMI 30.93 kg/m2 30.87 kg/m2 31.56 kg/m2 30.36 kg/m2 30.23 kg/m2 31.37 kg/m2 31.2 kg/m2       Type 2 diabetes mellitus with vascular disease (HCC) Controlled, inc mounjaro dose to assist with weight loss and lower HBa1C Alicia Neal is reminded of the importance of commitment to daily physical activity for 30 minutes or more, as able and the need to limit carbohydrate intake to 30 to 60 grams per meal to help with blood sugar control.   The need to take medication as prescribed, test blood sugar as directed, and to call between visits if there is a concern that blood sugar is uncontrolled is also discussed.   Alicia Neal is reminded of the importance of daily foot exam, annual eye examination, and good blood sugar, blood pressure and cholesterol control.     Latest Ref Rng &  Units 11/28/2022    8:49 AM 11/28/2022    8:48 AM 08/11/2022    8:36 AM 08/03/2022    9:50 AM 07/27/2022    9:13 AM  Diabetic Labs  HbA1c 4.8 - 5.6 %  6.5  6.3     Micro/Creat Ratio 0 - 29 mg/g creat    <16    Chol 0 - 200 mg/dL     141   HDL >40 mg/dL     52   Calc LDL 0 - 99 mg/dL     67   Triglycerides <150 mg/dL     111   Creatinine 0.44 - 1.00 mg/dL 0.76   0.71   0.69       11/29/2022    8:24 AM 08/10/2022    2:44 PM 08/03/2022    8:35 AM 07/19/2022    8:43 AM 04/18/2022    8:19 AM 12/29/2021   11:15 AM 12/28/2021    8:34 AM   BP/Weight  Systolic BP 539 767 341 937  902 409  Diastolic BP 73 78 72 84  90 72  Wt. (Lbs) 169.12 168.8 167.04 166 160 166 165.12  BMI 30.93 kg/m2 30.87 kg/m2 31.56 kg/m2 30.36 kg/m2 30.23 kg/m2 31.37 kg/m2 31.2 kg/m2      Latest Ref Rng & Units 08/03/2022    8:20 AM 03/08/2022   12:00 AM  Foot/eye exam completion dates  Eye Exam No Retinopathy  No Retinopathy      Foot Form Completion  Done      This result is from an external source.        Mixed hyperlipidemia Hyperlipidemia:Low fat diet discussed and encouraged.   Lipid Panel  Lab Results  Component Value Date   CHOL 141 07/27/2022   HDL 52 07/27/2022   LDLCALC 67 07/27/2022   TRIG 111 07/27/2022   CHOLHDL 2.7 07/27/2022     Controlled, no change in medication Updated lab needed at/ before next visit.   Mildly obese  Patient re-educated about  the importance of commitment to a  minimum of 150 minutes of exercise per week as able.  The importance of healthy food choices with portion control discussed, as well as eating regularly and within a 12 hour window most days. The need to choose "clean , green" food 50 to 75% of the time is discussed, as well as to make water the primary drink and set a goal of 64 ounces water daily.       11/29/2022    8:24 AM 08/10/2022    2:44 PM 08/03/2022    8:35 AM  Weight /BMI  Weight 169 lb 1.9 oz 168 lb 12.8 oz 167 lb 0.6 oz  Height '5\' 2"'$  (1.575 m) '5\' 2"'$  (1.575 m) '5\' 1"'$  (1.549 m)  BMI 30.93 kg/m2 30.87 kg/m2 31.56 kg/m2    Increase mounjaro doose  Hyperlipidemia LDL goal <100 Hyperlipidemia:Low fat diet discussed and encouraged.   Lipid Panel  Lab Results  Component Value Date   CHOL 141 07/27/2022   HDL 52 07/27/2022   LDLCALC 67 07/27/2022   TRIG 111 07/27/2022   CHOLHDL 2.7 07/27/2022     Updated lab needed at/ before next visit.   Allergic rhinitis Uncontrolled withn excess nasal congestion and drainage , needs to take meds daily as prescribed

## 2022-12-04 NOTE — Assessment & Plan Note (Signed)
Hyperlipidemia:Low fat diet discussed and encouraged.   Lipid Panel  Lab Results  Component Value Date   CHOL 141 07/27/2022   HDL 52 07/27/2022   LDLCALC 67 07/27/2022   TRIG 111 07/27/2022   CHOLHDL 2.7 07/27/2022     Controlled, no change in medication Updated lab needed at/ before next visit.

## 2022-12-04 NOTE — Assessment & Plan Note (Signed)
Uncontrolled withn excess nasal congestion and drainage , needs to take meds daily as prescribed

## 2022-12-04 NOTE — Assessment & Plan Note (Signed)
Controlled, no change in medication DASH diet and commitment to daily physical activity for a minimum of 30 minutes discussed and encouraged, as a part of hypertension management. The importance of attaining a healthy weight is also discussed.     11/29/2022    8:24 AM 08/10/2022    2:44 PM 08/03/2022    8:35 AM 07/19/2022    8:43 AM 04/18/2022    8:19 AM 12/29/2021   11:15 AM 12/28/2021    8:34 AM  BP/Weight  Systolic BP 379 444 619 012  224 114  Diastolic BP 73 78 72 84  90 72  Wt. (Lbs) 169.12 168.8 167.04 166 160 166 165.12  BMI 30.93 kg/m2 30.87 kg/m2 31.56 kg/m2 30.36 kg/m2 30.23 kg/m2 31.37 kg/m2 31.2 kg/m2

## 2022-12-04 NOTE — Assessment & Plan Note (Signed)
  Patient re-educated about  the importance of commitment to a  minimum of 150 minutes of exercise per week as able.  The importance of healthy food choices with portion control discussed, as well as eating regularly and within a 12 hour window most days. The need to choose "clean , green" food 50 to 75% of the time is discussed, as well as to make water the primary drink and set a goal of 64 ounces water daily.       11/29/2022    8:24 AM 08/10/2022    2:44 PM 08/03/2022    8:35 AM  Weight /BMI  Weight 169 lb 1.9 oz 168 lb 12.8 oz 167 lb 0.6 oz  Height '5\' 2"'$  (1.575 m) '5\' 2"'$  (1.575 m) '5\' 1"'$  (1.549 m)  BMI 30.93 kg/m2 30.87 kg/m2 31.56 kg/m2    Increase mounjaro doose

## 2022-12-05 ENCOUNTER — Other Ambulatory Visit: Payer: Self-pay

## 2022-12-05 DIAGNOSIS — Z87891 Personal history of nicotine dependence: Secondary | ICD-10-CM

## 2022-12-05 DIAGNOSIS — Z122 Encounter for screening for malignant neoplasm of respiratory organs: Secondary | ICD-10-CM

## 2022-12-12 ENCOUNTER — Other Ambulatory Visit: Payer: Self-pay

## 2022-12-12 ENCOUNTER — Other Ambulatory Visit: Payer: Self-pay | Admitting: "Endocrinology

## 2022-12-12 ENCOUNTER — Other Ambulatory Visit: Payer: Self-pay | Admitting: Nurse Practitioner

## 2022-12-12 DIAGNOSIS — I10 Essential (primary) hypertension: Secondary | ICD-10-CM

## 2022-12-13 ENCOUNTER — Other Ambulatory Visit (HOSPITAL_COMMUNITY): Payer: Self-pay

## 2022-12-13 ENCOUNTER — Other Ambulatory Visit: Payer: Self-pay

## 2022-12-13 MED ORDER — AMLODIPINE BESYLATE 10 MG PO TABS
10.0000 mg | ORAL_TABLET | Freq: Every day | ORAL | 0 refills | Status: DC
  Filled 2022-12-13: qty 30, 30d supply, fill #0

## 2022-12-13 MED ORDER — VITAMIN D 125 MCG (5000 UT) PO CAPS
1.0000 | ORAL_CAPSULE | Freq: Every day | ORAL | 0 refills | Status: DC
  Filled 2022-12-13: qty 100, 100d supply, fill #0
  Filled 2022-12-25: qty 90, 90d supply, fill #0
  Filled 2022-12-27 – 2023-01-10 (×3): qty 100, fill #0
  Filled 2023-01-20: qty 100, 100d supply, fill #0
  Filled 2023-01-29: qty 90, 90d supply, fill #0
  Filled 2023-04-24: qty 90, 90d supply, fill #1
  Filled 2023-04-26: qty 10, 10d supply, fill #1

## 2022-12-13 MED ORDER — REPATHA SURECLICK 140 MG/ML ~~LOC~~ SOAJ
140.0000 mg | SUBCUTANEOUS | 0 refills | Status: DC
  Filled 2022-12-13: qty 2, 28d supply, fill #0

## 2022-12-14 ENCOUNTER — Other Ambulatory Visit: Payer: Self-pay

## 2022-12-14 ENCOUNTER — Other Ambulatory Visit (HOSPITAL_COMMUNITY): Payer: Self-pay

## 2022-12-25 ENCOUNTER — Other Ambulatory Visit: Payer: Self-pay | Admitting: Nurse Practitioner

## 2022-12-26 ENCOUNTER — Other Ambulatory Visit (HOSPITAL_COMMUNITY): Payer: Self-pay

## 2022-12-26 ENCOUNTER — Other Ambulatory Visit: Payer: Self-pay

## 2022-12-27 ENCOUNTER — Other Ambulatory Visit (HOSPITAL_COMMUNITY): Payer: Self-pay

## 2022-12-27 ENCOUNTER — Other Ambulatory Visit: Payer: Self-pay | Admitting: Nurse Practitioner

## 2022-12-27 ENCOUNTER — Other Ambulatory Visit: Payer: Self-pay

## 2022-12-27 DIAGNOSIS — I10 Essential (primary) hypertension: Secondary | ICD-10-CM

## 2022-12-27 MED ORDER — REPATHA SURECLICK 140 MG/ML ~~LOC~~ SOAJ
140.0000 mg | SUBCUTANEOUS | 0 refills | Status: DC
  Filled 2022-12-27 – 2023-01-19 (×3): qty 2, 28d supply, fill #0

## 2022-12-27 MED ORDER — AMLODIPINE BESYLATE 10 MG PO TABS
10.0000 mg | ORAL_TABLET | Freq: Every day | ORAL | 0 refills | Status: DC
  Filled 2022-12-27 – 2023-01-10 (×2): qty 30, 30d supply, fill #0

## 2022-12-30 ENCOUNTER — Other Ambulatory Visit (HOSPITAL_COMMUNITY): Payer: Self-pay

## 2022-12-30 ENCOUNTER — Other Ambulatory Visit: Payer: Self-pay

## 2022-12-30 ENCOUNTER — Other Ambulatory Visit: Payer: Self-pay | Admitting: Nurse Practitioner

## 2023-01-02 ENCOUNTER — Other Ambulatory Visit: Payer: Self-pay

## 2023-01-02 ENCOUNTER — Other Ambulatory Visit (HOSPITAL_COMMUNITY): Payer: Self-pay

## 2023-01-02 ENCOUNTER — Other Ambulatory Visit (HOSPITAL_BASED_OUTPATIENT_CLINIC_OR_DEPARTMENT_OTHER): Payer: Self-pay

## 2023-01-05 ENCOUNTER — Encounter: Payer: Self-pay | Admitting: Radiology

## 2023-01-11 ENCOUNTER — Other Ambulatory Visit (HOSPITAL_COMMUNITY): Payer: Self-pay | Admitting: Family Medicine

## 2023-01-11 ENCOUNTER — Other Ambulatory Visit: Payer: Self-pay

## 2023-01-11 ENCOUNTER — Other Ambulatory Visit (HOSPITAL_COMMUNITY): Payer: Self-pay

## 2023-01-11 DIAGNOSIS — Z1231 Encounter for screening mammogram for malignant neoplasm of breast: Secondary | ICD-10-CM

## 2023-01-19 ENCOUNTER — Other Ambulatory Visit (HOSPITAL_COMMUNITY): Payer: Self-pay

## 2023-01-19 ENCOUNTER — Other Ambulatory Visit: Payer: Self-pay

## 2023-01-20 ENCOUNTER — Other Ambulatory Visit: Payer: Self-pay

## 2023-01-20 ENCOUNTER — Other Ambulatory Visit (HOSPITAL_COMMUNITY): Payer: Self-pay

## 2023-01-23 ENCOUNTER — Other Ambulatory Visit: Payer: Self-pay

## 2023-01-29 ENCOUNTER — Other Ambulatory Visit: Payer: Self-pay

## 2023-01-30 ENCOUNTER — Other Ambulatory Visit: Payer: Self-pay

## 2023-01-30 ENCOUNTER — Other Ambulatory Visit (HOSPITAL_COMMUNITY): Payer: Self-pay

## 2023-01-31 ENCOUNTER — Other Ambulatory Visit: Payer: Self-pay

## 2023-01-31 ENCOUNTER — Encounter: Payer: Self-pay | Admitting: Family Medicine

## 2023-01-31 MED ORDER — CITALOPRAM HYDROBROMIDE 20 MG PO TABS
20.0000 mg | ORAL_TABLET | Freq: Every day | ORAL | 3 refills | Status: DC
  Filled 2023-01-31: qty 90, 90d supply, fill #0
  Filled 2023-04-24: qty 90, 90d supply, fill #1
  Filled 2023-08-02: qty 90, 90d supply, fill #2
  Filled 2023-10-27: qty 90, 90d supply, fill #3

## 2023-02-01 ENCOUNTER — Other Ambulatory Visit (HOSPITAL_COMMUNITY): Payer: Self-pay

## 2023-02-01 ENCOUNTER — Other Ambulatory Visit: Payer: Self-pay

## 2023-02-10 ENCOUNTER — Ambulatory Visit (HOSPITAL_COMMUNITY): Payer: Commercial Managed Care - PPO

## 2023-02-15 ENCOUNTER — Other Ambulatory Visit: Payer: Self-pay | Admitting: Nurse Practitioner

## 2023-02-15 DIAGNOSIS — I10 Essential (primary) hypertension: Secondary | ICD-10-CM

## 2023-02-16 ENCOUNTER — Other Ambulatory Visit: Payer: Self-pay

## 2023-02-16 MED ORDER — AMLODIPINE BESYLATE 10 MG PO TABS
10.0000 mg | ORAL_TABLET | Freq: Every day | ORAL | 0 refills | Status: DC
  Filled 2023-02-16: qty 30, 30d supply, fill #0

## 2023-02-16 MED ORDER — REPATHA SURECLICK 140 MG/ML ~~LOC~~ SOAJ
140.0000 mg | SUBCUTANEOUS | 0 refills | Status: DC
  Filled 2023-02-16: qty 2, 28d supply, fill #0

## 2023-02-22 ENCOUNTER — Ambulatory Visit (HOSPITAL_COMMUNITY)
Admission: RE | Admit: 2023-02-22 | Discharge: 2023-02-22 | Disposition: A | Discharge status: Home / Self Care | Payer: Commercial Managed Care - PPO | Source: Ambulatory Visit | Attending: Family Medicine | Admitting: Family Medicine

## 2023-02-22 DIAGNOSIS — Z1231 Encounter for screening mammogram for malignant neoplasm of breast: Secondary | ICD-10-CM | POA: Insufficient documentation

## 2023-02-23 ENCOUNTER — Other Ambulatory Visit: Payer: Self-pay

## 2023-03-08 DIAGNOSIS — E119 Type 2 diabetes mellitus without complications: Secondary | ICD-10-CM | POA: Diagnosis not present

## 2023-03-08 LAB — HM DIABETES EYE EXAM

## 2023-03-11 ENCOUNTER — Other Ambulatory Visit: Payer: Self-pay | Admitting: Nurse Practitioner

## 2023-03-11 DIAGNOSIS — I10 Essential (primary) hypertension: Secondary | ICD-10-CM

## 2023-03-14 ENCOUNTER — Emergency Department (HOSPITAL_COMMUNITY): Payer: Commercial Managed Care - PPO

## 2023-03-14 ENCOUNTER — Emergency Department (HOSPITAL_COMMUNITY)
Admission: EM | Admit: 2023-03-14 | Discharge: 2023-03-14 | Disposition: A | Discharge status: Home / Self Care | Payer: Commercial Managed Care - PPO | Attending: Emergency Medicine | Admitting: Emergency Medicine

## 2023-03-14 ENCOUNTER — Other Ambulatory Visit: Payer: Self-pay

## 2023-03-14 ENCOUNTER — Encounter (HOSPITAL_COMMUNITY): Payer: Self-pay | Admitting: *Deleted

## 2023-03-14 DIAGNOSIS — S82842A Displaced bimalleolar fracture of left lower leg, initial encounter for closed fracture: Secondary | ICD-10-CM | POA: Diagnosis not present

## 2023-03-14 DIAGNOSIS — S82832A Other fracture of upper and lower end of left fibula, initial encounter for closed fracture: Secondary | ICD-10-CM | POA: Diagnosis not present

## 2023-03-14 DIAGNOSIS — R55 Syncope and collapse: Secondary | ICD-10-CM | POA: Insufficient documentation

## 2023-03-14 DIAGNOSIS — Z043 Encounter for examination and observation following other accident: Secondary | ICD-10-CM | POA: Diagnosis not present

## 2023-03-14 DIAGNOSIS — S0990XA Unspecified injury of head, initial encounter: Secondary | ICD-10-CM | POA: Diagnosis not present

## 2023-03-14 DIAGNOSIS — Z1152 Encounter for screening for COVID-19: Secondary | ICD-10-CM | POA: Diagnosis not present

## 2023-03-14 DIAGNOSIS — I10 Essential (primary) hypertension: Secondary | ICD-10-CM | POA: Diagnosis not present

## 2023-03-14 DIAGNOSIS — R519 Headache, unspecified: Secondary | ICD-10-CM | POA: Diagnosis not present

## 2023-03-14 DIAGNOSIS — X58XXXA Exposure to other specified factors, initial encounter: Secondary | ICD-10-CM | POA: Insufficient documentation

## 2023-03-14 DIAGNOSIS — S8992XA Unspecified injury of left lower leg, initial encounter: Secondary | ICD-10-CM | POA: Diagnosis present

## 2023-03-14 DIAGNOSIS — M25572 Pain in left ankle and joints of left foot: Secondary | ICD-10-CM | POA: Insufficient documentation

## 2023-03-14 DIAGNOSIS — R0981 Nasal congestion: Secondary | ICD-10-CM | POA: Diagnosis not present

## 2023-03-14 DIAGNOSIS — M47812 Spondylosis without myelopathy or radiculopathy, cervical region: Secondary | ICD-10-CM | POA: Diagnosis not present

## 2023-03-14 DIAGNOSIS — E119 Type 2 diabetes mellitus without complications: Secondary | ICD-10-CM | POA: Diagnosis not present

## 2023-03-14 DIAGNOSIS — T1490XA Injury, unspecified, initial encounter: Secondary | ICD-10-CM | POA: Diagnosis not present

## 2023-03-14 DIAGNOSIS — Z79899 Other long term (current) drug therapy: Secondary | ICD-10-CM | POA: Diagnosis not present

## 2023-03-14 DIAGNOSIS — S3993XA Unspecified injury of pelvis, initial encounter: Secondary | ICD-10-CM | POA: Diagnosis not present

## 2023-03-14 DIAGNOSIS — M7989 Other specified soft tissue disorders: Secondary | ICD-10-CM | POA: Diagnosis not present

## 2023-03-14 DIAGNOSIS — R059 Cough, unspecified: Secondary | ICD-10-CM | POA: Diagnosis not present

## 2023-03-14 LAB — COMPREHENSIVE METABOLIC PANEL
ALT: 19 U/L (ref 0–44)
AST: 20 U/L (ref 15–41)
Albumin: 4.3 g/dL (ref 3.5–5.0)
Alkaline Phosphatase: 70 U/L (ref 38–126)
Anion gap: 12 (ref 5–15)
BUN: 17 mg/dL (ref 8–23)
CO2: 23 mmol/L (ref 22–32)
Calcium: 9.2 mg/dL (ref 8.9–10.3)
Chloride: 97 mmol/L — ABNORMAL LOW (ref 98–111)
Creatinine, Ser: 0.9 mg/dL (ref 0.44–1.00)
GFR, Estimated: >60 mL/min (ref >60)
Glucose, Bld: 117 mg/dL — ABNORMAL HIGH (ref 70–99)
Potassium: 3.7 mmol/L (ref 3.5–5.1)
Sodium: 132 mmol/L — ABNORMAL LOW (ref 135–145)
Total Bilirubin: 0.6 mg/dL (ref 0.3–1.2)
Total Protein: 7.3 g/dL (ref 6.5–8.1)

## 2023-03-14 LAB — CBC WITH DIFFERENTIAL/PLATELET
Abs Immature Granulocytes: 0.05 10*3/uL (ref 0.00–0.07)
Basophils Absolute: 0.1 10*3/uL (ref 0.0–0.1)
Basophils Relative: 1 %
Eosinophils Absolute: 0.4 10*3/uL (ref 0.0–0.5)
Eosinophils Relative: 5 %
HCT: 41.1 % (ref 36.0–46.0)
Hemoglobin: 13.9 g/dL (ref 12.0–15.0)
Immature Granulocytes: 1 %
Lymphocytes Relative: 16 %
Lymphs Abs: 1.5 10*3/uL (ref 0.7–4.0)
MCH: 30.8 pg (ref 26.0–34.0)
MCHC: 33.8 g/dL (ref 30.0–36.0)
MCV: 91.1 fL (ref 80.0–100.0)
Monocytes Absolute: 0.7 10*3/uL (ref 0.1–1.0)
Monocytes Relative: 8 %
Neutro Abs: 6.7 10*3/uL (ref 1.7–7.7)
Neutrophils Relative %: 69 %
Platelets: 321 10*3/uL (ref 150–400)
RBC: 4.51 MIL/uL (ref 3.87–5.11)
RDW: 12.7 % (ref 11.5–15.5)
WBC: 9.5 10*3/uL (ref 4.0–10.5)
nRBC: 0 % (ref 0.0–0.2)

## 2023-03-14 LAB — URINALYSIS, ROUTINE W REFLEX MICROSCOPIC
Bacteria, UA: NONE SEEN
Bilirubin Urine: NEGATIVE
Glucose, UA: NEGATIVE mg/dL
Ketones, ur: NEGATIVE mg/dL
Leukocytes,Ua: NEGATIVE
Nitrite: NEGATIVE
Protein, ur: NEGATIVE mg/dL
Specific Gravity, Urine: 1.005 (ref 1.005–1.030)
pH: 6 (ref 5.0–8.0)

## 2023-03-14 LAB — TROPONIN I (HIGH SENSITIVITY)
Troponin I (High Sensitivity): 3 ng/L (ref <18)
Troponin I (High Sensitivity): 3 ng/L (ref <18)

## 2023-03-14 LAB — RESP PANEL BY RT-PCR (RSV, FLU A&B, COVID)  RVPGX2
Influenza A by PCR: NEGATIVE
Influenza B by PCR: NEGATIVE
Resp Syncytial Virus by PCR: NEGATIVE
SARS Coronavirus 2 by RT PCR: NEGATIVE

## 2023-03-14 LAB — CBG MONITORING, ED: Glucose-Capillary: 123 mg/dL — ABNORMAL HIGH (ref 70–99)

## 2023-03-14 LAB — LACTIC ACID, PLASMA: Lactic Acid, Venous: 1.8 mmol/L (ref 0.5–1.9)

## 2023-03-14 LAB — CK: Total CK: 122 U/L (ref 38–234)

## 2023-03-14 LAB — BRAIN NATRIURETIC PEPTIDE: B Natriuretic Peptide: 34 pg/mL (ref 0.0–100.0)

## 2023-03-14 LAB — MAGNESIUM: Magnesium: 1.9 mg/dL (ref 1.7–2.4)

## 2023-03-14 MED ORDER — LACTATED RINGERS IV BOLUS
500.0000 mL | Freq: Once | INTRAVENOUS | Status: AC
  Administered 2023-03-14: 500 mL via INTRAVENOUS

## 2023-03-14 MED ORDER — KETOROLAC TROMETHAMINE 15 MG/ML IJ SOLN
15.0000 mg | Freq: Once | INTRAMUSCULAR | Status: AC
  Administered 2023-03-14: 15 mg via INTRAVENOUS
  Filled 2023-03-14: qty 1

## 2023-03-14 MED ORDER — OXYCODONE-ACETAMINOPHEN 5-325 MG PO TABS: 1.0000 | ORAL_TABLET | Freq: Four times a day (QID) | ORAL | 0 refills | Status: DC | PRN

## 2023-03-14 MED ORDER — ONDANSETRON HCL 4 MG/2ML IJ SOLN
4.0000 mg | Freq: Once | INTRAMUSCULAR | Status: AC
  Administered 2023-03-14: 4 mg via INTRAVENOUS

## 2023-03-14 MED ORDER — OXYCODONE-ACETAMINOPHEN 5-325 MG PO TABS
1.0000 | ORAL_TABLET | ORAL | Status: DC | PRN
  Administered 2023-03-14: 1 via ORAL
  Filled 2023-03-14: qty 1

## 2023-03-14 NOTE — Discharge Instructions (Addendum)
Take Tylenol and ibuprofen for pain and soreness.  Take narcotic pain medication only as needed.  Keep weight off of left leg until you see the orthopedic surgeon.  Orthopedic surgeon contact information is below.  Call the office in the morning to set up a close follow-up appointment.  Return to the emergency department for any new or worsening symptoms of concern.

## 2023-03-14 NOTE — ED Notes (Signed)
CBG 123 

## 2023-03-14 NOTE — ED Provider Notes (Signed)
[place] EMERGENCY DEPARTMENT AT [facility] Provider Note   CSN: [id number] Arrival date & time: 03/14/23  1549     History  Chief Complaint  Patient presents with   Loss of Consciousness    Alicia Neal is a 62 y.o. female.   Loss of Consciousness Associated symptoms: headaches   Patient presents for loss of consciousness.  Medical history includes HLD, HTN, T2DM, GERD, anxiety, depression.  Starting last night, patient has developed some URI symptoms.  This includes headache, congestion, cough.  Shortly prior to arrival in the ED, patient was on a Zoom call.  She had a coughing spell.  During this coughing spell, she cut the camera on her computer.  At the time, she was seated in the chair.  When she came to, she was laying on the floor.  Episode was unwitnessed.  When patient came to, she called for help.  Since her fall, she has had pain in areas of left foot, ankle, and distal lower extremity.  She does not have any new areas of discomfort.  She denies any areas of numbness or paresthesias.  She denies any history of syncopal episodes.     Home Medications Prior to Admission medications   Medication Sig Start Date End Date Taking? Authorizing Provider  oxyCODONE-acetaminophen (PERCOCET/ROXICET) 5-325 MG tablet Take 1 tablet by mouth every 6 (six) hours as needed for severe pain. 03/14/23  Yes Gloris Manchester, MD  amLODipine (NORVASC) 10 MG tablet Take 1 tablet (10 mg total) by mouth daily. 02/16/23   Dani Gobble, NP  blood glucose meter kit and supplies Dispense Freestyle meter and supplies for once daily testing dx e11.9 08/25/20   Kerri Perches, MD  Cholecalciferol (VITAMIN D) 125 MCG (5000 UT) CAPS Take 1 capsule by mouth daily. 12/13/22   Dani Gobble, NP  citalopram (CELEXA) 20 MG tablet Take 1 tablet (20 mg total) by mouth daily. 01/31/23 01/31/24  Kerri Perches, MD  Evolocumab (REPATHA SURECLICK) 140 MG/ML SOAJ Inject 140 mg into the skin  every 14 days. 02/16/23   Dani Gobble, NP  fluticasone (FLONASE) 50 MCG/ACT nasal spray Place 2 sprays into both nostrils daily. 11/29/22   Kerri Perches, MD  glucose blood test strip Use to check blood sugar twice daily.   Accu-Chek Guide Test Strip 08/26/20   Kerri Perches, MD  loratadine (CLARITIN) 10 MG tablet Take 1 tablet (10 mg total) by mouth daily. 11/29/22   Kerri Perches, MD  losartan (COZAAR) 100 MG tablet Take 1 tablet (100 mg total) by mouth daily. 08/30/22   Kerri Perches, MD  pantoprazole (PROTONIX) 40 MG tablet TAKE 1 TABLET BY MOUTH ONCE DAILY 08/01/22 08/01/23  Kerri Perches, MD  tirzepatide [facility]) 10 MG/0.5ML Pen Inject 10 mg into the skin once a week. 11/29/22   Kerri Perches, MD      Allergies    Ezetimibe-simvastatin, Pravachol [pravastatin sodium], Simvastatin, Statins, and Zetia [ezetimibe]    Review of Systems   Review of Systems  HENT:  Positive for congestion.   Respiratory:  Positive for cough.   Cardiovascular:  Positive for syncope.  Musculoskeletal:  Positive for arthralgias and joint swelling.  Neurological:  Positive for syncope and headaches.  All other systems reviewed and are negative.   Physical Exam Updated Vital Signs BP 126/62   Pulse 90   Temp 98.3 F (36.8 C) (Oral)   Resp (!) 22   Ht 5'  2" (1.575 m)   Wt 76.7 kg   SpO2 98%   BMI 30.93 kg/m  Physical Exam Vitals and nursing note reviewed.  Constitutional:      General: She is not in acute distress.    Appearance: Normal appearance. She is well-developed. She is not ill-appearing, toxic-appearing or diaphoretic.  HENT:     Head: Normocephalic and atraumatic.     Right Ear: External ear normal.     Left Ear: External ear normal.     Nose: Nose normal.     Mouth/Throat:     Mouth: Mucous membranes are moist.  Eyes:     Extraocular Movements: Extraocular movements intact.     Conjunctiva/sclera: Conjunctivae normal.  Cardiovascular:      Rate and Rhythm: Normal rate and regular rhythm.     Heart sounds: No murmur heard. Pulmonary:     Effort: Pulmonary effort is normal. No respiratory distress.     Breath sounds: Normal breath sounds. No wheezing or rales.  Chest:     Chest wall: No tenderness.  Abdominal:     General: There is no distension.     Palpations: Abdomen is soft.     Tenderness: There is no abdominal tenderness.  Musculoskeletal:        General: Swelling, tenderness and signs of injury present.     Cervical back: Normal range of motion and neck supple.  Skin:    General: Skin is warm and dry.     Capillary Refill: Capillary refill takes less than 2 seconds.     Coloration: Skin is not jaundiced or pale.  Neurological:     General: No focal deficit present.     Mental Status: She is alert and oriented to person, place, and time.     Cranial Nerves: No cranial nerve deficit.     Sensory: No sensory deficit.     Motor: No weakness.     Coordination: Coordination normal.  Psychiatric:        Mood and Affect: Mood normal.        Behavior: Behavior normal.        Thought Content: Thought content normal.        Judgment: Judgment normal.     ED Results / Procedures / Treatments   Labs (all labs ordered are listed, but only abnormal results are displayed) Labs Reviewed  COMPREHENSIVE METABOLIC PANEL - Abnormal; Notable for the following components:      Result Value   Sodium 132 (*)    Chloride 97 (*)    Glucose, Bld 117 (*)    All other components within normal limits  URINALYSIS, ROUTINE W REFLEX MICROSCOPIC - Abnormal; Notable for the following components:   Color, Urine COLORLESS (*)    Hgb urine dipstick SMALL (*)    All other components within normal limits  CBG MONITORING, ED - Abnormal; Notable for the following components:   Glucose-Capillary 123 (*)    All other components within normal limits  RESP PANEL BY RT-PCR (RSV, FLU A&B, COVID)  [id number]  CBC WITH DIFFERENTIAL/PLATELET  LACTIC  ACID, PLASMA  CK  MAGNESIUM  BRAIN NATRIURETIC PEPTIDE  I-STAT CHEM 8, ED  TROPONIN I (HIGH SENSITIVITY)  TROPONIN I (HIGH SENSITIVITY)    EKG EKG Interpretation  Date/Time:  Tuesday Mar 14 2023 15:53:34 EDT Ventricular Rate:  87 PR Interval:  145 QRS Duration: 102 QT Interval:  380 QTC Calculation: 458 R Axis:   7 Text Interpretation: Sinus rhythm Confirmed by  Gloris Manchester [id number]) on 03/14/2023 5:27:18 PM  Radiology DG Femur Portable Min 2 Views Left  Result Date: 03/14/2023 CLINICAL DATA:  Unwitnessed fall EXAM: LEFT FEMUR PORTABLE 2 VIEWS COMPARISON:  None Available. FINDINGS: No definite acute fracture. Examination is mildly compromised by overlapping soft tissue. Vascular calcifications. No knee joint effusion. IMPRESSION: No definite acute fracture. Exam is mildly compromised by overlapping soft tissue. If there is ongoing concern for hip fracture recommend CT for further evaluation. Electronically Signed   By: Minerva Fester M.D.   On: 03/14/2023 18:02   DG Pelvis Portable  Result Date: 03/14/2023 CLINICAL DATA:  Trauma EXAM: PORTABLE PELVIS 1 VIEWS COMPARISON:  [place] radiograph dated February 24, 2011 FINDINGS: There is no evidence of pelvic fracture or diastasis. Vascular calcifications. No pelvic bone lesions are seen. IMPRESSION: No acute osseous abnormality. Electronically Signed   By: Allegra Lai M.D.   On: 03/14/2023 17:02   DG Chest Port 1 View  Result Date: 03/14/2023 CLINICAL DATA:  Trauma EXAM: PORTABLE CHEST 1 VIEW COMPARISON:  CXR 11/18/13 FINDINGS: No pleural effusion. No pneumothorax. No focal airspace opacity. Normal cardiac and mediastinal contours. No radiographically apparent displaced rib fractures. Visualized upper abdomen is unremarkable. IMPRESSION: No focal airspace opacity. Electronically Signed   By: Lorenza Cambridge M.D.   On: 03/14/2023 16:51   DG Tibia/Fibula Left Port  Result Date: 03/14/2023 CLINICAL DATA:  Pain after trauma EXAM: PORTABLE LEFT TIBIA  AND FIBULA - 2 VIEW; LEFT FOOT - COMPLETE 3 VIEW; PORTABLE LEFT ANKLE - 2 VIEW COMPARISON:  None Available. FINDINGS: Foot: Moderate well corticated plantar and Achilles calcaneal spurs. Degenerative changes of the dorsal aspect of the midfoot. Preserved bone mineralization. There is a linear lucency along the base of the third and second metatarsals. Subtle fracture is possible. Ankle: Mildly displaced transverse fracture of the base of the medial malleolus. Mildly displaced oblique distal fibular metaphyseal fracture. Minimal asymmetry of the ankle mortise. Soft tissue swelling. No additional fracture or dislocation. Tibia and fibula: Distal fibular and tibial fractures. No additional fracture is more proximal. Preserved bone mineralization and adjacent joint spaces. Only mild asymmetry once again of the ankle mortise. IMPRESSION: Displaced fractures of the medial malleolus and distal femoral metaphysis with some widening of the ankle mortise. Soft tissue swelling. Subtle lucencies at the base of second and third metatarsals. Subtle additional fractures are possible. If needed follow up CT as clinically appropriate. Calcaneal spurs.  Degenerative changes of the midfoot Electronically Signed   By: Karen Kays M.D.   On: 03/14/2023 16:49   DG Ankle Left Port  Result Date: 03/14/2023 CLINICAL DATA:  Pain after trauma EXAM: PORTABLE LEFT TIBIA AND FIBULA - 2 VIEW; LEFT FOOT - COMPLETE 3 VIEW; PORTABLE LEFT ANKLE - 2 VIEW COMPARISON:  None Available. FINDINGS: Foot: Moderate well corticated plantar and Achilles calcaneal spurs. Degenerative changes of the dorsal aspect of the midfoot. Preserved bone mineralization. There is a linear lucency along the base of the third and second metatarsals. Subtle fracture is possible. Ankle: Mildly displaced transverse fracture of the base of the medial malleolus. Mildly displaced oblique distal fibular metaphyseal fracture. Minimal asymmetry of the ankle mortise. Soft tissue  swelling. No additional fracture or dislocation. Tibia and fibula: Distal fibular and tibial fractures. No additional fracture is more proximal. Preserved bone mineralization and adjacent joint spaces. Only mild asymmetry once again of the ankle mortise. IMPRESSION: Displaced fractures of the medial malleolus and distal femoral metaphysis with some widening of the ankle mortise.  Soft tissue swelling. Subtle lucencies at the base of second and third metatarsals. Subtle additional fractures are possible. If needed follow up CT as clinically appropriate. Calcaneal spurs.  Degenerative changes of the midfoot Electronically Signed   By: Karen Kays M.D.   On: 03/14/2023 16:49   DG Foot Complete Left  Result Date: 03/14/2023 CLINICAL DATA:  Pain after trauma EXAM: PORTABLE LEFT TIBIA AND FIBULA - 2 VIEW; LEFT FOOT - COMPLETE 3 VIEW; PORTABLE LEFT ANKLE - 2 VIEW COMPARISON:  None Available. FINDINGS: Foot: Moderate well corticated plantar and Achilles calcaneal spurs. Degenerative changes of the dorsal aspect of the midfoot. Preserved bone mineralization. There is a linear lucency along the base of the third and second metatarsals. Subtle fracture is possible. Ankle: Mildly displaced transverse fracture of the base of the medial malleolus. Mildly displaced oblique distal fibular metaphyseal fracture. Minimal asymmetry of the ankle mortise. Soft tissue swelling. No additional fracture or dislocation. Tibia and fibula: Distal fibular and tibial fractures. No additional fracture is more proximal. Preserved bone mineralization and adjacent joint spaces. Only mild asymmetry once again of the ankle mortise. IMPRESSION: Displaced fractures of the medial malleolus and distal femoral metaphysis with some widening of the ankle mortise. Soft tissue swelling. Subtle lucencies at the base of second and third metatarsals. Subtle additional fractures are possible. If needed follow up CT as clinically appropriate. Calcaneal spurs.   Degenerative changes of the midfoot Electronically Signed   By: Karen Kays M.D.   On: 03/14/2023 16:49   CT HEAD WO CONTRAST  Result Date: 03/14/2023 CLINICAL DATA:  Head trauma, moderate-severe; Polytrauma, blunt. EXAM: CT HEAD WITHOUT CONTRAST CT CERVICAL SPINE WITHOUT CONTRAST TECHNIQUE: Multidetector CT imaging of the head and cervical spine was performed following the standard protocol without intravenous contrast. Multiplanar CT image reconstructions of the cervical spine were also generated. RADIATION DOSE REDUCTION: This exam was performed according to the departmental dose-optimization program which includes automated exposure control, adjustment of the mA and/or kV according to patient size and/or use of iterative reconstruction technique. COMPARISON:  Head CT 12/19/2008. FINDINGS: CT HEAD FINDINGS Brain: No acute intracranial hemorrhage. Mild chronic small-vessel disease. Gray-white differentiation is otherwise preserved. No hydrocephalus or extra-axial collection. No mass effect or midline shift. Vascular: No hyperdense vessel or unexpected calcification. Skull: No calvarial fracture or suspicious bone lesion. Skull base is unremarkable. Sinuses/Orbits: Unremarkable. Other: None. CT CERVICAL SPINE FINDINGS Alignment: Normal. Skull base and vertebrae: No acute fracture. Normal craniocervical junction. No suspicious bone lesions. Soft tissues and spinal canal: No prevertebral fluid or swelling. No visible canal hematoma. Disc levels: Mild cervical spondylosis without high-grade spinal canal stenosis. Upper chest: Unremarkable. Other: Atherosclerotic calcifications of the carotid bulbs. IMPRESSION: 1. No acute intracranial abnormality. 2. No acute cervical spine fracture or traumatic malalignment. Electronically Signed   By: Orvan Falconer M.D.   On: 03/14/2023 16:35   CT CERVICAL SPINE WO CONTRAST  Result Date: 03/14/2023 CLINICAL DATA:  Head trauma, moderate-severe; Polytrauma, blunt. EXAM: CT  HEAD WITHOUT CONTRAST CT CERVICAL SPINE WITHOUT CONTRAST TECHNIQUE: Multidetector CT imaging of the head and cervical spine was performed following the standard protocol without intravenous contrast. Multiplanar CT image reconstructions of the cervical spine were also generated. RADIATION DOSE REDUCTION: This exam was performed according to the departmental dose-optimization program which includes automated exposure control, adjustment of the mA and/or kV according to patient size and/or use of iterative reconstruction technique. COMPARISON:  Head CT 12/19/2008. FINDINGS: CT HEAD FINDINGS Brain: No acute intracranial hemorrhage. Mild  chronic small-vessel disease. Gray-white differentiation is otherwise preserved. No hydrocephalus or extra-axial collection. No mass effect or midline shift. Vascular: No hyperdense vessel or unexpected calcification. Skull: No calvarial fracture or suspicious bone lesion. Skull base is unremarkable. Sinuses/Orbits: Unremarkable. Other: None. CT CERVICAL SPINE FINDINGS Alignment: Normal. Skull base and vertebrae: No acute fracture. Normal craniocervical junction. No suspicious bone lesions. Soft tissues and spinal canal: No prevertebral fluid or swelling. No visible canal hematoma. Disc levels: Mild cervical spondylosis without high-grade spinal canal stenosis. Upper chest: Unremarkable. Other: Atherosclerotic calcifications of the carotid bulbs. IMPRESSION: 1. No acute intracranial abnormality. 2. No acute cervical spine fracture or traumatic malalignment. Electronically Signed   By: Orvan Falconer M.D.   On: 03/14/2023 16:35    Procedures Procedures    Medications Ordered in ED Medications  oxyCODONE-acetaminophen (PERCOCET/ROXICET) 5-325 MG per tablet 1 tablet (1 tablet Oral Given 03/14/23 1829)  ondansetron (ZOFRAN) injection 4 mg (has no administration in time range)  lactated ringers bolus 500 mL (0 mLs Intravenous Stopped 03/14/23 1749)  ketorolac (TORADOL) 15 MG/ML  injection 15 mg (15 mg Intravenous Given 03/14/23 1944)    ED Course/ Medical Decision Making/ A&P                             Medical Decision Making Amount and/or Complexity of Data Reviewed Labs: ordered. Radiology: ordered. ECG/medicine tests: ordered.  Risk Prescription drug management.   This patient presents to the ED for concern of syncope and fall, this involves an extensive number of treatment options, and is a complaint that carries with it a high risk of complications and morbidity.  The differential diagnosis includes vasovagal episode, arrhythmia, dehydration, anemia, metabolic abnormalities, acute injuries   Co morbidities that complicate the patient evaluation  HLD, HTN, T2DM, GERD, anxiety, depression   Additional history obtained:  Additional history obtained from N/A External records from outside source obtained and reviewed including EMR   Lab Tests:  I Ordered, and personally interpreted labs.  The pertinent results include: Normal hemoglobin, no leukocytosis, normal electrolytes, normal troponins   Imaging Studies ordered:  I ordered imaging studies including x-ray imaging of chest, pelvis, left foot, ankle, tip/fib, femur; CT imaging of head and cervical spine I independently visualized and interpreted imaging which showed ankle x-ray shows displaced fractures of medial malleolus and distal fibular metaphysis with some widening of ankle mortise.  There is subtle lucencies of second and third metatarsal bases.  No other acute injuries were identified on imaging studies. I agree with the radiologist interpretation   Cardiac Monitoring: / EKG:  The patient was maintained on a cardiac monitor.  I personally viewed and interpreted the cardiac monitored which showed an underlying rhythm of: Sinus rhythm   Consultations Obtained:  I requested consultation with the orthopedic surgeon, Dr.Cairns,  and discussed lab and imaging findings as well as  pertinent plan - they recommend: No further imaging here in the ED.  Splint and close outpatient follow-up   Problem List / ED Course / Critical interventions / Medication management  Patient presents for loss of consciousness.  This occurred immediately prior to arrival in the ED.  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  She denies any prodrome other than a coughing spell.  Duration of LOC is unknown.  Patient was able to call for help when she came to.  She did suffer a fall from the chair  to the floor and has since had pain in areas of distal left lower extremity.  On exam, she is alert and oriented.  She has no focal neurologic deficits.  Breathing is unlabored at this time.  Heart rate is normal.  CBG is 123.  On secondary survey, patient does have swelling to area of left ankle and foot.  She has tenderness to left calf.  She declines any pain medication at this time.  Patient was placed on bedside cardiac monitor and diagnostic workup was initiated.  On bedside monitor, patient remained in normal sinus rhythm.  Laboratory workup was reassuring.  Imaging studies were notable for displaced fractures of medial malleolus and distal fibular metaphysis as well as subtle lucencies at bases of second and third metatarsals.  I discussed these images with orthopedic surgeon on-call, Dr. Dallas Schimke, who recommends no further imaging here in the ED.  He does recommend splinting with close outpatient follow-up.  Patient continued to decline any pain medication.  [place] splint was placed.  Patient was provided crutches and prepack Percocet.  She was advised to follow-up with orthopedic surgery as soon as possible.  She was discharged in good condition. I ordered medication including IVF for hydration; Percocet for analgesia Reevaluation of the patient after these medicines showed that the patient improved I have reviewed the patients home medicines and have made adjustments  as needed   Social Determinants of Health:  Medically literate, has access to outpatient care         Final Clinical Impression(s) / ED Diagnoses Final diagnoses:  Closed bimalleolar fracture of left ankle, initial encounter    Rx / DC Orders ED Discharge Orders          Ordered    oxyCODONE-acetaminophen (PERCOCET/ROXICET) 5-325 MG tablet  Every 6 hours PRN        03/14/23 2003              Gloris Manchester, MD 03/14/23 2005

## 2023-03-14 NOTE — ED Triage Notes (Signed)
Pt was sitting in her office on a [employer] meeting and all of a sudden she heard someone calling her name from the meeting and she woke up and was on the floor. Pt unsure how she got onto the floor. Swelling and pain to left ankle.

## 2023-03-14 NOTE — ED Notes (Signed)
Pt provided with crutches and demonstrated use of them.

## 2023-03-15 ENCOUNTER — Ambulatory Visit: Payer: Commercial Managed Care - PPO | Admitting: Orthopedic Surgery

## 2023-03-15 ENCOUNTER — Encounter: Payer: Self-pay | Admitting: Orthopedic Surgery

## 2023-03-15 ENCOUNTER — Other Ambulatory Visit (HOSPITAL_COMMUNITY): Payer: Self-pay

## 2023-03-15 DIAGNOSIS — Z01818 Encounter for other preprocedural examination: Secondary | ICD-10-CM

## 2023-03-15 DIAGNOSIS — S82842A Displaced bimalleolar fracture of left lower leg, initial encounter for closed fracture: Secondary | ICD-10-CM | POA: Diagnosis not present

## 2023-03-15 MED ORDER — ONDANSETRON HCL 4 MG PO TABS
4.0000 mg | ORAL_TABLET | Freq: Three times a day (TID) | ORAL | 0 refills | Status: DC | PRN
  Filled 2023-03-15: qty 15, 5d supply, fill #0

## 2023-03-15 MED FILL — Oxycodone w/ Acetaminophen Tab 5-325 MG: ORAL | Qty: 6 | Status: AC

## 2023-03-15 NOTE — H&P (View-Only) (Signed)
New Patient Visit  Assessment: Alicia Neal is a 62 y.o. female with the following: 1. Closed bimalleolar fracture of left ankle, initial encounter  Plan: Alicia Neal blacked out, fell and sustained a left bimalleolar ankle fracture.  This is an unstable fracture.  I recommended operative fixation.  The procedure was discussed in detail.  All questions have been answered.  Will plan to proceed with surgery on 03/23/2023.  The hospital will contact her to schedule preoperative appointment.  All questions have been answered.  I provided her with a prescription for a rolling walker.  I have sent a prescription for Zofran.  She will continue to take Percocet as needed.  She is to elevate her ankle is much as possible.  She will take her dose of Mounjaro today, but nothing until surgery.  Risks and benefits of surgery, including, but not limited to infection, bleeding, persistent pain, damage to surrounding structures, need for further surgery, malunion, nonunion and more severe complications associated with anesthesia were discussed.  All questions have been answered and they have elected to proceed with surgery.    Follow-up: Return for After surgery; 03/23/23.  Subjective:  Chief Complaint  Patient presents with   Fracture    L ankle DOI 03/14/23    History of Present Illness: Alicia Neal is a 62 y.o. female who presents for evaluation of left ankle pain.  She was in a Zoom meeting yesterday afternoon, when she started to cough.  She subsequently blacked out.  She fell and hit the floor.  She was taken to emergency department, where x-rays demonstrated a left bimalleolar ankle fracture.  She was placed in a splint.  Her pain has been intermittent.  She has taken Percocet, but this causes nausea.  She has taken ibuprofen as needed.  She has been elevating her leg is much as possible.   Review of Systems: No fevers or chills No numbness or tingling No chest pain No shortness of  breath No bowel or bladder dysfunction No GI distress No headaches   Medical History:  Past Medical History:  Diagnosis Date   Adrenal adenoma    right side   Allergic rhinitis    Allergy    cat,dog,animal dander   Anxiety disorder    Carpal tunnel syndrome of left wrist    had surgery to repair   Chronic depression    Dr. Dellia Cloud   GERD (gastroesophageal reflux disease)    History of abnormal cervical Pap smear    History of adenomatous polyp of colon    History of hiatal hernia    Hyperlipidemia    Hypertension    IBS (irritable bowel syndrome)    OSA (obstructive sleep apnea)    per pt study yrs ago-- CPAP intolerant    Pemphigoid, cicatricial    pruritic autoimmune blistering skin disorder   PONV (postoperative nausea and vomiting)    Pulmonary nodule, right pulmologist-  dr Riley Lam mcquaid   x2    per CT 09-11-2015   Trigger finger, left    ring finger   Type 2 diabetes mellitus (HCC)    Uncontrolled type 2 diabetes mellitus with hyperglycemia (HCC) 04/17/2018   Wears glasses     Past Surgical History:  Procedure Laterality Date   CARDIOVASCULAR STRESS TEST  04-30-2003   Small focus of decreased perfusion on rest images in the mid-distal anterior wall (worrisome for ischemia),  normal LV function and wall motion , ef 60%   CARPAL TUNNEL  RELEASE Left 11/26/2015   Procedure: CARPAL TUNNEL RELEASE;  Surgeon: Bradly Bienenstock, MD;  Location: [facility] OR;  Service: Orthopedics;  Laterality: Left;   CHOLECYSTECTOMY  1989   COLONOSCOPY  11/04/2009   POLYPECTOMY     TRANSTHORACIC ECHOCARDIOGRAM  04-30-2003   mild hypokinetic in the inferior and posterior wall, ef 50%/  trivial MR /  mild TR   TRIGGER FINGER RELEASE Left 11/26/2015   Procedure: RELEASE TRIGGER FINGER/A-1 PULLEY LEFT RING  FINGER;  Surgeon: Bradly Bienenstock, MD;  Location: [facility] OR;  Service: Orthopedics;  Laterality: Left;   TUBAL LIGATION  2000    Family History  Problem Relation Age of Onset   Hypertension  Paternal Grandmother    Stroke Paternal Grandmother    Cancer Maternal Grandmother        colon and vaginal   Cancer Maternal Grandfather        colon   COPD Maternal Grandfather    Lung cancer Father    Diabetes Father    Cancer Father        lung   Arthritis Mother    Cancer Brother        skin   Colon cancer Neg Hx    Colon polyps Neg Hx    Esophageal cancer Neg Hx    Rectal cancer Neg Hx    Stomach cancer Neg Hx    Social History   Tobacco Use   Smoking status: Former    Packs/day: 0.25    Years: 40.00    Additional pack years: 0.00    Total pack years: 10.00    Types: Cigarettes   Smokeless tobacco: Never  Vaping Use   Vaping Use: Never used  Substance Use Topics   Alcohol use: Yes    Alcohol/week: 0.0 standard drinks of alcohol    Comment: occasional   Drug use: No    Allergies  Allergen Reactions   Ezetimibe-Simvastatin Other (See Comments)    REACTION: muscle aches   Pravachol [Pravastatin Sodium] Other (See Comments)    Joint pains and memory loss   Simvastatin Other (See Comments)    REACTION: muscle aches   Statins    Zetia [Ezetimibe] Other (See Comments)    Generalized joint pains    Current Meds  Medication Sig   ondansetron (ZOFRAN) 4 MG tablet Take 1 tablet (4 mg total) by mouth every 8 (eight) hours as needed for up to 14 days for nausea or vomiting.    Objective: There were no vitals taken for this visit.  Physical Exam:  General: Alert and oriented. and No acute distress. Gait: Unable to ambulate.  Splint on the left leg is clean, dry and intact.  Exposed toes are warm and well-perfused.  She can wiggle the exposed toes.  No skin breakdown at the periphery of the splint.  Sensation is intact to the distal aspect of her toes.  IMAGING: I personally reviewed images previously obtained from the ED  X-rays from the emergency department demonstrates a mildly displaced bimalleolar ankle fracture, with medial clear space  widening.   New Medications:  Meds ordered this encounter  Medications   ondansetron (ZOFRAN) 4 MG tablet    Sig: Take 1 tablet (4 mg total) by mouth every 8 (eight) hours as needed for up to 14 days for nausea or vomiting.    Dispense:  15 tablet    Refill:  0      Oliver Barre, MD  03/15/2023 3:00 PM

## 2023-03-15 NOTE — Patient Instructions (Signed)
Okay to take a dose of Mounjaro today.  No more doses prior to surgery.  Elevate your leg is much as possible to help with swelling.

## 2023-03-15 NOTE — Progress Notes (Signed)
New Patient Visit  Assessment: Alicia Neal is a 62 y.o. female with the following: 1. Closed bimalleolar fracture of left ankle, initial encounter  Plan: Taelyr H Grammatico blacked out, fell and sustained a left bimalleolar ankle fracture.  This is an unstable fracture.  I recommended operative fixation.  The procedure was discussed in detail.  All questions have been answered.  Will plan to proceed with surgery on 03/23/2023.  The hospital will contact her to schedule preoperative appointment.  All questions have been answered.  I provided her with a prescription for a rolling walker.  I have sent a prescription for Zofran.  She will continue to take Percocet as needed.  She is to elevate her ankle is much as possible.  She will take her dose of Mounjaro today, but nothing until surgery.  Risks and benefits of surgery, including, but not limited to infection, bleeding, persistent pain, damage to surrounding structures, need for further surgery, malunion, nonunion and more severe complications associated with anesthesia were discussed.  All questions have been answered and they have elected to proceed with surgery.    Follow-up: Return for After surgery; 03/23/23.  Subjective:  Chief Complaint  Patient presents with   Fracture    L ankle DOI 03/14/23    History of Present Illness: Alicia Neal is a 62 y.o. female who presents for evaluation of left ankle pain.  She was in a Zoom meeting yesterday afternoon, when she started to cough.  She subsequently blacked out.  She fell and hit the floor.  She was taken to emergency department, where x-rays demonstrated a left bimalleolar ankle fracture.  She was placed in a splint.  Her pain has been intermittent.  She has taken Percocet, but this causes nausea.  She has taken ibuprofen as needed.  She has been elevating her leg is much as possible.   Review of Systems: No fevers or chills No numbness or tingling No chest pain No shortness of  breath No bowel or bladder dysfunction No GI distress No headaches   Medical History:  Past Medical History:  Diagnosis Date   Adrenal adenoma    right side   Allergic rhinitis    Allergy    cat,dog,animal dander   Anxiety disorder    Carpal tunnel syndrome of left wrist    had surgery to repair   Chronic depression    Dr. Gutterman   GERD (gastroesophageal reflux disease)    History of abnormal cervical Pap smear    History of adenomatous polyp of colon    History of hiatal hernia    Hyperlipidemia    Hypertension    IBS (irritable bowel syndrome)    OSA (obstructive sleep apnea)    per pt study yrs ago-- CPAP intolerant    Pemphigoid, cicatricial    pruritic autoimmune blistering skin disorder   PONV (postoperative nausea and vomiting)    Pulmonary nodule, right pulmologist-  dr douglas mcquaid   x2    per CT 09-11-2015   Trigger finger, left    ring finger   Type 2 diabetes mellitus (HCC)    Uncontrolled type 2 diabetes mellitus with hyperglycemia (HCC) 04/17/2018   Wears glasses     Past Surgical History:  Procedure Laterality Date   CARDIOVASCULAR STRESS TEST  04-30-2003   Small focus of decreased perfusion on rest images in the mid-distal anterior wall (worrisome for ischemia),  normal LV function and wall motion , ef 60%   CARPAL TUNNEL   RELEASE Left 11/26/2015   Procedure: CARPAL TUNNEL RELEASE;  Surgeon: Fred Ortmann, MD;  Location: [facility] OR;  Service: Orthopedics;  Laterality: Left;   CHOLECYSTECTOMY  1989   COLONOSCOPY  11/04/2009   POLYPECTOMY     TRANSTHORACIC ECHOCARDIOGRAM  04-30-2003   mild hypokinetic in the inferior and posterior wall, ef 50%/  trivial MR /  mild TR   TRIGGER FINGER RELEASE Left 11/26/2015   Procedure: RELEASE TRIGGER FINGER/A-1 PULLEY LEFT RING  FINGER;  Surgeon: Fred Ortmann, MD;  Location: [facility] OR;  Service: Orthopedics;  Laterality: Left;   TUBAL LIGATION  2000    Family History  Problem Relation Age of Onset   Hypertension  Paternal Grandmother    Stroke Paternal Grandmother    Cancer Maternal Grandmother        colon and vaginal   Cancer Maternal Grandfather        colon   COPD Maternal Grandfather    Lung cancer Father    Diabetes Father    Cancer Father        lung   Arthritis Mother    Cancer Brother        skin   Colon cancer Neg Hx    Colon polyps Neg Hx    Esophageal cancer Neg Hx    Rectal cancer Neg Hx    Stomach cancer Neg Hx    Social History   Tobacco Use   Smoking status: Former    Packs/day: 0.25    Years: 40.00    Additional pack years: 0.00    Total pack years: 10.00    Types: Cigarettes   Smokeless tobacco: Never  Vaping Use   Vaping Use: Never used  Substance Use Topics   Alcohol use: Yes    Alcohol/week: 0.0 standard drinks of alcohol    Comment: occasional   Drug use: No    Allergies  Allergen Reactions   Ezetimibe-Simvastatin Other (See Comments)    REACTION: muscle aches   Pravachol [Pravastatin Sodium] Other (See Comments)    Joint pains and memory loss   Simvastatin Other (See Comments)    REACTION: muscle aches   Statins    Zetia [Ezetimibe] Other (See Comments)    Generalized joint pains    Current Meds  Medication Sig   ondansetron (ZOFRAN) 4 MG tablet Take 1 tablet (4 mg total) by mouth every 8 (eight) hours as needed for up to 14 days for nausea or vomiting.    Objective: There were no vitals taken for this visit.  Physical Exam:  General: Alert and oriented. and No acute distress. Gait: Unable to ambulate.  Splint on the left leg is clean, dry and intact.  Exposed toes are warm and well-perfused.  She can wiggle the exposed toes.  No skin breakdown at the periphery of the splint.  Sensation is intact to the distal aspect of her toes.  IMAGING: I personally reviewed images previously obtained from the ED  X-rays from the emergency department demonstrates a mildly displaced bimalleolar ankle fracture, with medial clear space  widening.   New Medications:  Meds ordered this encounter  Medications   ondansetron (ZOFRAN) 4 MG tablet    Sig: Take 1 tablet (4 mg total) by mouth every 8 (eight) hours as needed for up to 14 days for nausea or vomiting.    Dispense:  15 tablet    Refill:  0      Alicia Fore A Kataryna Mcquilkin, MD  03/15/2023 3:00 PM   

## 2023-03-16 ENCOUNTER — Encounter (HOSPITAL_COMMUNITY): Payer: Self-pay

## 2023-03-16 ENCOUNTER — Telehealth: Payer: Self-pay | Admitting: Orthopedic Surgery

## 2023-03-16 NOTE — Telephone Encounter (Signed)
[employer] forms received. To [employer]. 

## 2023-03-17 ENCOUNTER — Encounter (HOSPITAL_COMMUNITY)
Admission: RE | Admit: 2023-03-17 | Discharge: 2023-03-17 | Disposition: A | Discharge status: Home / Self Care | Payer: Commercial Managed Care - PPO | Source: Ambulatory Visit | Attending: Orthopedic Surgery | Admitting: Orthopedic Surgery

## 2023-03-18 ENCOUNTER — Other Ambulatory Visit: Payer: Self-pay | Admitting: Nurse Practitioner

## 2023-03-18 ENCOUNTER — Other Ambulatory Visit (HOSPITAL_COMMUNITY): Payer: Self-pay

## 2023-03-18 DIAGNOSIS — I10 Essential (primary) hypertension: Secondary | ICD-10-CM

## 2023-03-20 ENCOUNTER — Telehealth: Payer: Self-pay | Admitting: Orthopedic Surgery

## 2023-03-20 NOTE — Telephone Encounter (Signed)
[employer] forms received. To [employer]. 

## 2023-03-21 ENCOUNTER — Other Ambulatory Visit: Payer: Self-pay

## 2023-03-23 ENCOUNTER — Encounter (HOSPITAL_COMMUNITY)
Admission: RE | Disposition: A | Discharge status: Home / Self Care | Payer: Self-pay | Source: Home / Self Care | Attending: Orthopedic Surgery

## 2023-03-23 ENCOUNTER — Other Ambulatory Visit (HOSPITAL_COMMUNITY): Payer: Self-pay

## 2023-03-23 ENCOUNTER — Ambulatory Visit (HOSPITAL_COMMUNITY): Payer: Commercial Managed Care - PPO

## 2023-03-23 ENCOUNTER — Ambulatory Visit (HOSPITAL_BASED_OUTPATIENT_CLINIC_OR_DEPARTMENT_OTHER): Payer: Commercial Managed Care - PPO | Admitting: Anesthesiology

## 2023-03-23 ENCOUNTER — Encounter (HOSPITAL_COMMUNITY): Payer: Self-pay | Admitting: Orthopedic Surgery

## 2023-03-23 ENCOUNTER — Ambulatory Visit (HOSPITAL_COMMUNITY): Payer: Commercial Managed Care - PPO | Admitting: Anesthesiology

## 2023-03-23 ENCOUNTER — Ambulatory Visit (HOSPITAL_COMMUNITY)
Admission: RE | Admit: 2023-03-23 | Discharge: 2023-03-23 | Disposition: A | Discharge status: Home / Self Care | Payer: Commercial Managed Care - PPO | Attending: Orthopedic Surgery | Admitting: Orthopedic Surgery

## 2023-03-23 ENCOUNTER — Other Ambulatory Visit: Payer: Self-pay

## 2023-03-23 DIAGNOSIS — E119 Type 2 diabetes mellitus without complications: Secondary | ICD-10-CM | POA: Diagnosis not present

## 2023-03-23 DIAGNOSIS — S82842A Displaced bimalleolar fracture of left lower leg, initial encounter for closed fracture: Secondary | ICD-10-CM | POA: Insufficient documentation

## 2023-03-23 DIAGNOSIS — F418 Other specified anxiety disorders: Secondary | ICD-10-CM | POA: Diagnosis not present

## 2023-03-23 DIAGNOSIS — X58XXXA Exposure to other specified factors, initial encounter: Secondary | ICD-10-CM | POA: Insufficient documentation

## 2023-03-23 DIAGNOSIS — S82892D Other fracture of left lower leg, subsequent encounter for closed fracture with routine healing: Secondary | ICD-10-CM

## 2023-03-23 DIAGNOSIS — Z87891 Personal history of nicotine dependence: Secondary | ICD-10-CM | POA: Diagnosis not present

## 2023-03-23 DIAGNOSIS — S82842D Displaced bimalleolar fracture of left lower leg, subsequent encounter for closed fracture with routine healing: Secondary | ICD-10-CM | POA: Diagnosis not present

## 2023-03-23 DIAGNOSIS — S82892A Other fracture of left lower leg, initial encounter for closed fracture: Secondary | ICD-10-CM

## 2023-03-23 DIAGNOSIS — I10 Essential (primary) hypertension: Secondary | ICD-10-CM

## 2023-03-23 HISTORY — PX: ORIF ANKLE FRACTURE: SHX5408

## 2023-03-23 LAB — GLUCOSE, CAPILLARY
Glucose-Capillary: 140 mg/dL — ABNORMAL HIGH (ref 70–99)
Glucose-Capillary: 186 mg/dL — ABNORMAL HIGH (ref 70–99)

## 2023-03-23 SURGERY — OPEN REDUCTION INTERNAL FIXATION (ORIF) ANKLE FRACTURE
Anesthesia: General | Site: Ankle | Laterality: Left

## 2023-03-23 MED ORDER — PHENYLEPHRINE HCL-NACL 20-0.9 MG/250ML-% IV SOLN
INTRAVENOUS | Status: DC | PRN
  Administered 2023-03-23: 50 ug/min via INTRAVENOUS

## 2023-03-23 MED ORDER — BUPIVACAINE HCL (PF) 0.5 % IJ SOLN
INTRAMUSCULAR | Status: AC
  Filled 2023-03-23: qty 30

## 2023-03-23 MED ORDER — LACTATED RINGERS IV SOLN: INTRAVENOUS | Status: DC

## 2023-03-23 MED ORDER — ASPIRIN 81 MG PO TBEC: 81.0000 mg | DELAYED_RELEASE_TABLET | Freq: Two times a day (BID) | ORAL | 0 refills | Status: AC

## 2023-03-23 MED ORDER — LIDOCAINE HCL (PF) 2 % IJ SOLN
INTRAMUSCULAR | Status: AC
  Filled 2023-03-23: qty 5

## 2023-03-23 MED ORDER — BUPIVACAINE HCL (PF) 0.5 % IJ SOLN: INTRAMUSCULAR | Status: DC | PRN

## 2023-03-23 MED ORDER — ASPIRIN 81 MG PO TBEC
81.0000 mg | DELAYED_RELEASE_TABLET | Freq: Two times a day (BID) | ORAL | 0 refills | Status: DC
  Filled 2023-03-23: qty 84, 42d supply, fill #0

## 2023-03-23 MED ORDER — OXYCODONE HCL 5 MG PO TABS: 5.0000 mg | ORAL_TABLET | ORAL | 0 refills | Status: AC | PRN

## 2023-03-23 MED ORDER — MIDAZOLAM HCL 2 MG/2ML IJ SOLN
2.0000 mg | Freq: Once | INTRAMUSCULAR | Status: AC
  Administered 2023-03-23: 2 mg via INTRAVENOUS
  Filled 2023-03-23: qty 2

## 2023-03-23 MED ORDER — DEXAMETHASONE SODIUM PHOSPHATE 4 MG/ML IJ SOLN
INTRAMUSCULAR | Status: DC | PRN
  Administered 2023-03-23: 5 mg via INTRAVENOUS

## 2023-03-23 MED ORDER — CHLORHEXIDINE GLUCONATE 0.12 % MT SOLN
15.0000 mL | Freq: Once | OROMUCOSAL | Status: AC
  Administered 2023-03-23: 15 mL via OROMUCOSAL

## 2023-03-23 MED ORDER — MIDAZOLAM HCL 2 MG/2ML IJ SOLN
INTRAMUSCULAR | Status: AC
  Filled 2023-03-23: qty 2

## 2023-03-23 MED ORDER — FENTANYL CITRATE (PF) 100 MCG/2ML IJ SOLN
INTRAMUSCULAR | Status: AC
  Filled 2023-03-23: qty 2

## 2023-03-23 MED ORDER — ONDANSETRON HCL 4 MG/2ML IJ SOLN: 4.0000 mg | Freq: Once | INTRAMUSCULAR | Status: DC | PRN

## 2023-03-23 MED ORDER — PROPOFOL 10 MG/ML IV BOLUS
INTRAVENOUS | Status: DC | PRN
  Administered 2023-03-23: 150 mg via INTRAVENOUS
  Administered 2023-03-23: 50 mg via INTRAVENOUS

## 2023-03-23 MED ORDER — LIDOCAINE HCL (PF) 1 % IJ SOLN
INTRAMUSCULAR | Status: DC | PRN
  Administered 2023-03-23 (×2): 3 mL

## 2023-03-23 MED ORDER — ONDANSETRON HCL 4 MG/2ML IJ SOLN
INTRAMUSCULAR | Status: AC
  Filled 2023-03-23: qty 2

## 2023-03-23 MED ORDER — BUPIVACAINE HCL (PF) 0.5 % IJ SOLN
INTRAMUSCULAR | Status: DC | PRN
  Administered 2023-03-23: 10 mL via PERINEURAL

## 2023-03-23 MED ORDER — PHENYLEPHRINE 80 MCG/ML (10ML) SYRINGE FOR IV PUSH (FOR BLOOD PRESSURE SUPPORT)
PREFILLED_SYRINGE | INTRAVENOUS | Status: AC
  Filled 2023-03-23: qty 10

## 2023-03-23 MED ORDER — DEXAMETHASONE SODIUM PHOSPHATE 4 MG/ML IJ SOLN
INTRAMUSCULAR | Status: AC
  Filled 2023-03-23: qty 2

## 2023-03-23 MED ORDER — FENTANYL CITRATE (PF) 250 MCG/5ML IJ SOLN
INTRAMUSCULAR | Status: DC | PRN
  Administered 2023-03-23 (×3): 50 ug via INTRAVENOUS

## 2023-03-23 MED ORDER — LIDOCAINE HCL (PF) 1 % IJ SOLN
INTRAMUSCULAR | Status: AC
  Filled 2023-03-23: qty 30

## 2023-03-23 MED ORDER — ROCURONIUM BROMIDE 10 MG/ML (PF) SYRINGE
PREFILLED_SYRINGE | INTRAVENOUS | Status: DC | PRN
  Administered 2023-03-23: 60 mg via INTRAVENOUS

## 2023-03-23 MED ORDER — DEXAMETHASONE SODIUM PHOSPHATE 10 MG/ML IJ SOLN
INTRAMUSCULAR | Status: AC
  Filled 2023-03-23: qty 1

## 2023-03-23 MED ORDER — FENTANYL CITRATE PF 50 MCG/ML IJ SOSY
50.0000 ug | PREFILLED_SYRINGE | INTRAMUSCULAR | Status: DC | PRN
  Administered 2023-03-23: 50 ug via INTRAVENOUS
  Filled 2023-03-23: qty 1

## 2023-03-23 MED ORDER — OXYCODONE HCL 5 MG PO TABS
5.0000 mg | ORAL_TABLET | ORAL | 0 refills | Status: DC | PRN
  Filled 2023-03-23: qty 30, 5d supply, fill #0

## 2023-03-23 MED ORDER — ONDANSETRON HCL 4 MG/2ML IJ SOLN
INTRAMUSCULAR | Status: DC | PRN
  Administered 2023-03-23: 4 mg via INTRAVENOUS

## 2023-03-23 MED ORDER — BUPIVACAINE-EPINEPHRINE (PF) 0.25% -1:200000 IJ SOLN
INTRAMUSCULAR | Status: AC
  Filled 2023-03-23: qty 30

## 2023-03-23 MED ORDER — ORAL CARE MOUTH RINSE: 15.0000 mL | Freq: Once | OROMUCOSAL | Status: AC

## 2023-03-23 MED ORDER — ROCURONIUM BROMIDE 10 MG/ML (PF) SYRINGE
PREFILLED_SYRINGE | INTRAVENOUS | Status: AC
  Filled 2023-03-23: qty 10

## 2023-03-23 MED ORDER — PROPOFOL 10 MG/ML IV BOLUS
INTRAVENOUS | Status: AC
  Filled 2023-03-23: qty 20

## 2023-03-23 MED ORDER — DEXAMETHASONE SODIUM PHOSPHATE 4 MG/ML IJ SOLN
INTRAMUSCULAR | Status: DC | PRN
  Administered 2023-03-23 (×2): 4 mg via PERINEURAL

## 2023-03-23 MED ORDER — SUGAMMADEX SODIUM 200 MG/2ML IV SOLN
INTRAVENOUS | Status: DC | PRN
  Administered 2023-03-23: 200 mg via INTRAVENOUS

## 2023-03-23 MED ORDER — ONDANSETRON HCL 4 MG PO TABS: 4.0000 mg | ORAL_TABLET | Freq: Three times a day (TID) | ORAL | 0 refills | Status: DC | PRN

## 2023-03-23 MED ORDER — DEXMEDETOMIDINE HCL IN NACL 80 MCG/20ML IV SOLN
INTRAVENOUS | Status: DC | PRN
  Administered 2023-03-23 (×4): 8 ug via INTRAVENOUS

## 2023-03-23 MED ORDER — CEFAZOLIN SODIUM-DEXTROSE 2-4 GM/100ML-% IV SOLN
2.0000 g | INTRAVENOUS | Status: AC
  Administered 2023-03-23: 2 g via INTRAVENOUS
  Filled 2023-03-23: qty 100

## 2023-03-23 MED ORDER — HYDROMORPHONE HCL 1 MG/ML IJ SOLN: 0.2500 mg | INTRAMUSCULAR | Status: DC | PRN

## 2023-03-23 MED ORDER — MEPERIDINE HCL 50 MG/ML IJ SOLN: 6.2500 mg | INTRAMUSCULAR | Status: DC | PRN

## 2023-03-23 MED ORDER — PROPOFOL 500 MG/50ML IV EMUL
INTRAVENOUS | Status: DC | PRN
  Administered 2023-03-23: 25 ug/kg/min via INTRAVENOUS

## 2023-03-23 MED ORDER — PHENYLEPHRINE HCL-NACL 20-0.9 MG/250ML-% IV SOLN
INTRAVENOUS | Status: AC
  Filled 2023-03-23: qty 250

## 2023-03-23 MED ORDER — GLYCOPYRROLATE PF 0.2 MG/ML IJ SOSY
PREFILLED_SYRINGE | INTRAMUSCULAR | Status: AC
  Filled 2023-03-23: qty 1

## 2023-03-23 MED ORDER — ONDANSETRON HCL 4 MG PO TABS
4.0000 mg | ORAL_TABLET | Freq: Three times a day (TID) | ORAL | 0 refills | Status: DC | PRN
  Filled 2023-03-23: qty 15, 5d supply, fill #0

## 2023-03-23 MED ORDER — ACETAMINOPHEN 500 MG PO TABS: 1000.0000 mg | ORAL_TABLET | Freq: Three times a day (TID) | ORAL | 0 refills | Status: AC

## 2023-03-23 MED ORDER — PHENYLEPHRINE 80 MCG/ML (10ML) SYRINGE FOR IV PUSH (FOR BLOOD PRESSURE SUPPORT)
PREFILLED_SYRINGE | INTRAVENOUS | Status: DC | PRN
  Administered 2023-03-23 (×3): 160 ug via INTRAVENOUS

## 2023-03-23 MED ORDER — LIDOCAINE 2% (20 MG/ML) 5 ML SYRINGE
INTRAMUSCULAR | Status: DC | PRN
  Administered 2023-03-23: 100 mg via INTRAVENOUS

## 2023-03-23 MED ORDER — BUPIVACAINE-EPINEPHRINE (PF) 0.25% -1:200000 IJ SOLN
INTRAMUSCULAR | Status: DC | PRN
  Administered 2023-03-23: 16 mL via PERINEURAL
  Administered 2023-03-23: 14 mL via PERINEURAL

## 2023-03-23 MED ORDER — 0.9 % SODIUM CHLORIDE (POUR BTL) OPTIME
TOPICAL | Status: DC | PRN
  Administered 2023-03-23: 1000 mL

## 2023-03-23 MED ORDER — PROPOFOL 500 MG/50ML IV EMUL
INTRAVENOUS | Status: AC
  Filled 2023-03-23: qty 50

## 2023-03-23 MED ORDER — ACETAMINOPHEN 500 MG PO TABS
1000.0000 mg | ORAL_TABLET | Freq: Three times a day (TID) | ORAL | 0 refills | Status: DC
  Filled 2023-03-23: qty 84, 14d supply, fill #0

## 2023-03-23 MED ORDER — DEXMEDETOMIDINE HCL IN NACL 80 MCG/20ML IV SOLN
INTRAVENOUS | Status: AC
  Filled 2023-03-23: qty 20

## 2023-03-23 SURGICAL SUPPLY — 65 items
APL PRP STRL LF DISP 70% ISPRP (MISCELLANEOUS) ×1
BANDAGE ESMARK 4X12 BL STRL LF (DISPOSABLE) ×1 IMPLANT
BIT DRILL 2 CANN GRADUATED (BIT) IMPLANT
BIT DRILL 2.5 CANN LNG (BIT) IMPLANT
BIT DRILL 2.6 CANN (BIT) IMPLANT
BIT DRILL 2.7 (BIT) ×1
BIT DRILL 2.7X2.7/3XSCR ANKL (BIT) IMPLANT
BIT DRL 2.7X2.7/3XSCR ANKL (BIT) ×1
BLADE SURG 15 STRL LF DISP TIS (BLADE) ×1 IMPLANT
BLADE SURG 15 STRL SS (BLADE) ×1
BNDG CMPR 12X4 ELC STRL LF (DISPOSABLE) ×1
BNDG CMPR 5X4 CHSV STRCH STRL (GAUZE/BANDAGES/DRESSINGS) ×1
BNDG CMPR STD VLCR NS LF 5.8X4 (GAUZE/BANDAGES/DRESSINGS) ×2
BNDG COHESIVE 4X5 TAN STRL LF (GAUZE/BANDAGES/DRESSINGS) IMPLANT
BNDG ELASTIC 4X5.8 VLCR NS LF (GAUZE/BANDAGES/DRESSINGS) ×1 IMPLANT
BNDG ESMARK 4X12 BLUE STRL LF (DISPOSABLE) ×1
CHLORAPREP W/TINT 26 (MISCELLANEOUS) ×1 IMPLANT
CLOTH BEACON ORANGE TIMEOUT ST (SAFETY) ×1 IMPLANT
COVER LIGHT HANDLE STERIS (MISCELLANEOUS) ×2 IMPLANT
COVER MAYO STAND XLG (MISCELLANEOUS) IMPLANT
CUFF TOURN SGL QUICK 34 (TOURNIQUET CUFF) ×1
CUFF TRNQT CYL 34X4.125X (TOURNIQUET CUFF) ×1 IMPLANT
DRAPE C-ARM FOLDED MOBILE STRL (DRAPES) ×1 IMPLANT
DRAPE C-ARMOR (DRAPES) ×1 IMPLANT
ELECT REM PT RETURN 9FT ADLT (ELECTROSURGICAL) ×1
ELECTRODE REM PT RTRN 9FT ADLT (ELECTROSURGICAL) ×1 IMPLANT
GAUZE SPONGE 4X4 12PLY STRL (GAUZE/BANDAGES/DRESSINGS) ×1 IMPLANT
GLOVE BIO SURGEON STRL SZ7 (GLOVE) IMPLANT
GLOVE BIO SURGEON STRL SZ8 (GLOVE) ×2 IMPLANT
GLOVE BIOGEL PI IND STRL 7.0 (GLOVE) ×2 IMPLANT
GLOVE ECLIPSE 6.5 STRL STRAW (GLOVE) IMPLANT
GLOVE SRG 8 PF TXTR STRL LF DI (GLOVE) ×1 IMPLANT
GLOVE SURG UNDER POLY LF SZ8 (GLOVE) ×1
GOWN STRL REUS W/ TWL XL LVL3 (GOWN DISPOSABLE) ×1 IMPLANT
GOWN STRL REUS W/TWL LRG LVL3 (GOWN DISPOSABLE) ×2 IMPLANT
GOWN STRL REUS W/TWL XL LVL3 (GOWN DISPOSABLE) ×1
GUIDEWIRE 1.35MM (WIRE) IMPLANT
K-WIRE BB-TAK (WIRE) ×2
KIT TURNOVER KIT A (KITS) ×1 IMPLANT
KWIRE BB-TAK (WIRE) IMPLANT
MANIFOLD NEPTUNE II (INSTRUMENTS) ×1 IMPLANT
NS IRRIG 1000ML POUR BTL (IV SOLUTION) ×1 IMPLANT
PACK BASIC LIMB (CUSTOM PROCEDURE TRAY) ×1 IMPLANT
PAD ABD 5X9 TENDERSORB (GAUZE/BANDAGES/DRESSINGS) ×4 IMPLANT
PAD ARMBOARD 7.5X6 YLW CONV (MISCELLANEOUS) ×1 IMPLANT
PADDING CAST ABS COTTON 4X4 ST (CAST SUPPLIES) ×2 IMPLANT
PLATE FIBULA 6H (Plate) IMPLANT
SCREW CANN LP LT 4X50 (Screw) IMPLANT
SCREW COMP KREULOCK 2.7X12 (Screw) IMPLANT
SCREW COMP KREULOCK 2.7X16 (Screw) IMPLANT
SCREW COMP KREULOCK 2.7X18 (Screw) IMPLANT
SCREW LOW PROFILE 3.5X16 (Screw) IMPLANT
SCREW NLOCK 26X2.7XLOPRFL (Screw) IMPLANT
SCREW NON-LOCKING 3.5X12MM (Screw) IMPLANT
SCREW NONLOCK 2.7X26MM (Screw) ×1 IMPLANT
SET BASIN LINEN APH (SET/KITS/TRAYS/PACK) ×1 IMPLANT
SPLINT PLASTER CAST XFAST 5X30 (CAST SUPPLIES) IMPLANT
SPONGE T-LAP 18X18 ~~LOC~~+RFID (SPONGE) ×1 IMPLANT
STRIP CLOSURE SKIN 1/2X4 (GAUZE/BANDAGES/DRESSINGS) IMPLANT
SUT MNCRL AB 4-0 PS2 18 (SUTURE) IMPLANT
SUT MON AB 2-0 CT1 36 (SUTURE) IMPLANT
SUT VIC AB 2-0 CT1 27 (SUTURE)
SUT VIC AB 2-0 CT1 TAPERPNT 27 (SUTURE) ×1 IMPLANT
SYR 30ML LL (SYRINGE) IMPLANT
SYR BULB IRRIG 60ML STRL (SYRINGE) ×1 IMPLANT

## 2023-03-23 NOTE — Discharge Instructions (Signed)
Alicia Neal A. Dallas Schimke, MD [facility] [address] [place],  [place]  [address] Phone: [phone] Fax: [phone]   POST-OPERATIVE INSTRUCTIONS - LOWER EXTREMITY   WOUND CARE Please keep splint clean dry and intact until followup.  You may shower on Post-Op Day #2.  You must keep splint dry during this process and may find that a plastic bag taped around the leg or alternatively a towel based bath may be a better option.   If you get your splint wet or if it is damaged please contact our clinic.  EXERCISES Due to your splint being in place you will not be able to bear weight through your extremity.   DO NOT PUT ANY WEIGHT ON YOUR OPERATIVE LEG Please use crutches or a walker to avoid weight bearing.   REGIONAL ANESTHESIA (NERVE BLOCKS) The anesthesia team may have performed a nerve block for you if safe in the setting of your care.  This is a great tool used to minimize pain.  Typically the block may start wearing off overnight but the long acting medicine may last for 3-4 days.  The nerve block wearing off can be a challenging period but please utilize your as needed pain medications to try and manage this period.    POST-OP MEDICATIONS- Multimodal approach to pain control  In general your pain will be controlled with a combination of substances.  Prescriptions unless otherwise discussed are electronically sent to your pharmacy.  This is a carefully made plan we use to minimize narcotic use.     - ibuprofen - Anti-inflammatory medication taken on a scheduled basis  - Acetaminophen - Non-narcotic pain medicine taken on a scheduled basis   - Oxycodone - This is a strong narcotic, to be used only on an "as needed" basis for pain.  -  Aspirin 81mg  - This medicine is used to minimize the risk of blood clots after surgery.             -          Zofran - take as needed for nausea   FOLLOW-UP If you develop a Fever (>101.5), Redness or Drainage from the  surgical incision site, please call our office to arrange for an evaluation. Please call the office to schedule a follow-up appointment for your incision check if you do not already have one, 10-14 days post-operatively.  IF YOU HAVE ANY QUESTIONS, PLEASE FEEL FREE TO CALL OUR OFFICE.  HELPFUL INFORMATION  If you had a block, it will wear off between 8-24 hrs postop typically.  This is period when your pain may go from nearly zero to the pain you would have had postop without the block.  This is an abrupt transition but nothing dangerous is happening.  You may take an extra dose of narcotic when this happens.  You should wean off your narcotic medicines as soon as you are able.  Most patients will be off or using minimal narcotics before their first postop appointment.   Elevating your leg will help with swelling and pain control.  You are encouraged to elevate your leg as much as possible in the first couple of weeks following surgery.  Imagine a drop of water on your toe, and your goal is to get that water back to your heart.  We suggest you use the pain medication the first night prior to going to bed, in order to ease any pain when the anesthesia wears off. You should  should avoid taking pain medications on an empty stomach as it will make you nauseous.  Do not drink alcoholic beverages or take illicit drugs when taking pain medications.  In most states it is against the law to drive while you are in a splint or sling.  And certainly against the law to drive while taking narcotics.  You may return to work/school in the next couple of days when you feel up to it.   Pain medication may make you constipated.  Below are a few solutions to try in this order: Decrease the amount of pain medication if you aren't having pain. Drink lots of decaffeinated fluids. Drink prune juice and/or each dried prunes  If the first 3 don't work start with additional solutions Take Colace - an over-the-counter stool  softener Take Senokot - an over-the-counter laxative Take Miralax - a stronger over-the-counter laxative  

## 2023-03-23 NOTE — Anesthesia Procedure Notes (Addendum)
Procedure Name: Intubation Date/Time: 03/23/2023 7:36 AM  Performed by: Julian Reil, CRNAPre-anesthesia Checklist: Patient identified, Emergency Drugs available, Suction available and Patient being monitored Patient Re-evaluated:Patient Re-evaluated prior to induction Oxygen Delivery Method: Circle system utilized Preoxygenation: Pre-oxygenation with 100% oxygen Induction Type: IV induction Ventilation: Mask ventilation without difficulty Laryngoscope Size: Miller and 3 Grade View: Grade I Tube type: Oral Tube size: 7.0 mm Number of attempts: 1 Airway Equipment and Method: Stylet Placement Confirmation: ETT inserted through vocal cords under direct vision, positive ETCO2 and breath sounds checked- equal and bilateral Secured at: 22 cm Tube secured with: Tape Dental Injury: Teeth and Oropharynx as per pre-operative assessment

## 2023-03-23 NOTE — Anesthesia Procedure Notes (Signed)
Anesthesia Regional Block: Popliteal block   Pre-Anesthetic Checklist: , timeout performed,  Correct Patient, Correct Site, Correct Laterality,  Correct Procedure, Correct Position, site marked,  Risks and benefits discussed,  At surgeon's request and post-op pain management  Laterality: Left and Lower  Prep: chloraprep       Needles:  Injection technique: Single-shot  Needle Type: Echogenic Stimulator Needle     Needle Length: 10cm  Needle Gauge: 20   Needle insertion depth: 5 cm   Additional Needles:   Procedures:,,,, ultrasound used (permanent image in chart),,    Narrative:  Start time: 03/23/2023 7:10 AM End time: 03/23/2023 7:16 AM Injection made incrementally with aspirations every 5 mL.  Performed by: Personally  Anesthesiologist: Molli Barrows, MD  Additional Notes: BP cuff, EKG monitors applied. Sedation begun. After nerve location anesthetic injected incrementally, slowly , and after neg aspirations. Tolerated well.

## 2023-03-23 NOTE — Transfer of Care (Signed)
Immediate Anesthesia Transfer of Care Note  Patient: Alicia Neal  Procedure(s) Performed: OPEN REDUCTION INTERNAL FIXATION (ORIF) ANKLE FRACTURE (Left: Ankle)  Patient Location: PACU  Anesthesia Type:General  Level of Consciousness: awake  Airway & Oxygen Therapy: Patient Spontanous Breathing and Patient connected to nasal cannula oxygen  Post-op Assessment: Report given to RN and Post -op Vital signs reviewed and stable  Post vital signs: Reviewed and stable  Last Vitals:  Vitals Value Taken Time  BP    Temp    Pulse 70 03/23/23 0933  Resp    SpO2 99 % 03/23/23 0933  Vitals shown include unvalidated device data.  Last Pain:  Vitals:   03/23/23 0641  TempSrc: Oral  PainSc: 0-No pain         Complications: No notable events documented.

## 2023-03-23 NOTE — Anesthesia Preprocedure Evaluation (Signed)
Anesthesia Evaluation  Patient identified by MRN, date of birth, ID band Patient awake    Reviewed: Allergy & Precautions, H&P , NPO status , Patient's Chart, lab work & pertinent test results  History of Anesthesia Complications (+) PONV and history of anesthetic complications  Airway Mallampati: III  TM Distance: >3 FB Neck ROM: Full    Dental  (+) Dental Advisory Given No notable dental injury:   Pulmonary sleep apnea (snoring) , former smoker   Pulmonary exam normal breath sounds clear to auscultation       Cardiovascular hypertension, Pt. on medications Normal cardiovascular exam+ Valvular Problems/Murmurs  Rhythm:Regular Rate:Normal     Neuro/Psych  PSYCHIATRIC DISORDERS Anxiety Depression     Neuromuscular disease    GI/Hepatic Neg liver ROS, hiatal hernia,GERD  Medicated,,  Endo/Other  negative endocrine ROSdiabetes    Renal/GU negative Renal ROS  negative genitourinary   Musculoskeletal negative musculoskeletal ROS (+)    Abdominal   Peds negative pediatric ROS (+)  Hematology negative hematology ROS (+)   Anesthesia Other Findings   Reproductive/Obstetrics negative OB ROS                             Anesthesia Physical Anesthesia Plan  ASA: 2  Anesthesia Plan: General   Post-op Pain Management: Regional block* and Dilaudid IV   Induction: Intravenous  PONV Risk Score and Plan: 4 or greater and Dexamethasone, Ondansetron and Midazolam  Airway Management Planned: LMA  Additional Equipment:   Intra-op Plan:   Post-operative Plan: Extubation in OR  Informed Consent: I have reviewed the patients History and Physical, chart, labs and discussed the procedure including the risks, benefits and alternatives for the proposed anesthesia with the patient or authorized representative who has indicated his/her understanding and acceptance.     Dental advisory given  Plan  Discussed with: CRNA and Surgeon  Anesthesia Plan Comments:        Anesthesia Quick Evaluation

## 2023-03-23 NOTE — Interval H&P Note (Signed)
History and Physical Interval Note:  03/23/2023 7:22 AM  Alicia Neal  has presented today for surgery, with the diagnosis of Left bimalleolar ankle fracture.  The various methods of treatment have been discussed with the patient and family. After consideration of risks, benefits and other options for treatment, the patient has consented to  Procedure(s): OPEN REDUCTION INTERNAL FIXATION (ORIF) ANKLE FRACTURE (Left) as a surgical intervention.  The patient's history has been reviewed, patient examined, no change in status, stable for surgery.  I have reviewed the patient's chart and labs.  Questions were answered to the patient's satisfaction.     Oliver Barre

## 2023-03-23 NOTE — Anesthesia Procedure Notes (Signed)
Anesthesia Regional Block: Adductor canal block   Pre-Anesthetic Checklist: , timeout performed,  Correct Patient, Correct Site, Correct Laterality,  Correct Procedure, Correct Position, site marked,  Risks and benefits discussed,  At surgeon's request and post-op pain management  Laterality: Left and Lower  Prep: chloraprep       Needles:  Injection technique: Single-shot  Needle Type: Echogenic Stimulator Needle     Needle Length: 10cm  Needle Gauge: 20   Needle insertion depth: 6 cm   Additional Needles:   Procedures:,,,, ultrasound used (permanent image in chart),,    Narrative:  Start time: 03/23/2023 7:18 AM End time: 03/23/2023 7:20 AM Injection made incrementally with aspirations every 5 mL.  Performed by: Personally  Anesthesiologist: Molli Barrows, MD  Additional Notes: BP cuff, EKG monitors applied. Sedation begun. After nerve location anesthetic injected incrementally, slowly , and after neg aspirations. Tolerated well.

## 2023-03-23 NOTE — Op Note (Signed)
Orthopaedic Surgery Operative Note (CSN: [id number])  Alicia Neal  Oct 16, 1961 Date of Surgery: 03/23/2023   Diagnoses:  Left bimalleolar ankle fracture  Procedure: ORIF bimalleolar ankle fracture.  Lag screw with plate and screw construct for distal fibula fracture and 2 cannulated, partially threaded screws medially.   Operative Finding Successful completion of the planned procedure.  Oblique fracture of distal fibula repaired with a single lag screw from posterior to anterior.  Then placed a distal locking fibular plate to provide additional fixation.  We placed 2 screws through the small, anterior based medial malleolus fracture fragment.  Excellent fixation overall.  Mortise was congruent.  No syndesmotic disruption.   Post-Op Diagnosis: Same Surgeons:Primary: Oliver Barre, MD Assistants: Cecile Sheerer Location: [facility] OR ROOM 4 Anesthesia: General with regional anesthesia Antibiotics: Ancef 2 g Tourniquet time:  Total Tourniquet Time Documented: Thigh (Left) - 80 minutes Total: Thigh (Left) - 80 minutes  Estimated Blood Loss: 30 cc Complications: None Specimens: None  Implants: Implant Name Type Inv. Item Serial No. Manufacturer Lot No. LRB No. Used Action  PLATE FIBULA 6H - [id number] Plate PLATE FIBULA 6H  [employer] STERILE ON SET Left 1 Implanted  SCREW NONLOCK 2.7X26MM - [id number] Screw SCREW NONLOCK 2.7X26MM  [employer] STERILE ON SET Left 1 Implanted  SCREW LOW PROFILE 3.5X16 - [id number] Screw SCREW LOW PROFILE 3.5X16  [employer] STERILE ON SET Left 1 Implanted  SCREW NON-LOCKING 3.5X12MM - [id number] Screw SCREW NON-LOCKING 3.5X12MM  [employer] STERILE ON SET Left 2 Implanted  SCREW COMP KREULOCK 2.7X16 - [id number] Screw SCREW COMP KREULOCK 2.7X16  [employer] STERILE ON SET Left 1 Implanted  SCREW COMP KREULOCK 2.7X18 - [id number] Screw SCREW COMP KREULOCK 2.7X18  [employer] STERILE ON SET Left 2 Implanted  SCREW COMP KREULOCK 2.7X12 - [id number] Screw  SCREW COMP KREULOCK 2.7X12  [employer] STERILE ON SET Left 1 Implanted  SCREW CANN LP LT 4X50 - [id number] Screw SCREW CANN LP LT 4X50  [employer] STERILE ON SET Left 2 Implanted    Indications for Surgery:   Alicia Neal is a 62 y.o. female who fell and sustained a left bimalleolar ankle fracture.  Injury XR demonstrate an unstable ankle.  In order to restore form and function, I recommended operative fixation.   Benefits and risks of operative and nonoperative management were discussed prior to surgery with the patient and informed consent form was completed.  Specific risks including infection, need for additional surgery, bleeding, nonunion, malunion, damage to surrounding structures, persistent pain, stiffness and more severe complications associated with anesthesia.  She like to proceed.  Surgical consent was finalized.   Procedure:   The patient was identified properly. Informed consent was obtained and the surgical site was marked. The patient was taken to the OR where general anesthesia was induced.  The patient was positioned supine, with a leg on bone foam.  The left leg was prepped and draped in the usual sterile fashion.  Timeout was performed before the beginning of the case.  Tourniquet was used for the above duration.  She received 2 g of Ancef prior to making incision.  We started on the distal fibula fracture.  Under direct fluoroscopic guidance, we planned an incision, directly over the distal fibula.  We incised sharply through skin.  We then bluntly dissected to the lateral aspect of the fibula.  The fracture site was identified, at roughly the level of the syndesmosis.  There was a small amount of comminution.  There is 1 small bone fragment anteriorly, which was excised.  The fracture site was opened, and we used combination of elevator, curette and irrigation to remove the callus.  We then used a point-to-point fracture reduction clamp, to provisionally fix the fracture.   Given the orientation of fracture, plan to proceed with a lag screw, from posterior to anterior.  The posterior cortex was drilled first, followed by a smaller drill bit across the proximal fracture fragment.  This was roughly orthogonal to the fracture line.  The lag screw was then placed, and achieved good purchase.  The fracture reduction clamp was then removed, and this was able to maintain the reduction.  We then selected the above size plate, and provisionally fixed this to the lateral aspect of the fibula.  Orthogonal views under fluoroscopy confirmed appropriate placement of the plate on the distal fibula.  We proceeded to place 2 screws in the shaft, to secure the plate to the bone.  We then proceeded to place 4 locking screws in the distal cluster.  The locking screws were unicortical.  We then placed an additional bicortical screw in the shaft.  Overall, we are pleased with our purchase, as well as the reduction.  Fluoroscopy confirmed appropriate placement of the screws, and overall alignment.  We then turned our attention to the medial fracture fragment.  We made a curvilinear incision over the distal aspect of the medial malleolus.  We dissected sharply through the skin.  We then used blunt dissection to dissect to the fracture site.  We did not encounter any nerve or blood vessels.  The fracture site was identified, and noted to be very anterior.  It was opened, and we irrigated the fracture callus.  We then introduced a point-to-point fracture reduction clamp, and reduce the fracture.  Under direct fluoroscopy, we then placed 2 K wires in retrograde fashion.  The K wires did not breach the joint.  These were roughly orthogonal to the fracture line.  We then drilled the outer cortex, measured and placed 2 partially-threaded, cannulated screws across the medial malleolus fracture.  We achieved good purchase.  Under fluoroscopy, the screws were within the medial malleolus, and did not breach the  joint.  The fracture was reduced.  Final fluoroscopic images demonstrated appropriate placement of our plate and screws, as well as a stable ankle joint.  There is no syndesmotic disruption.  The mortise was congruent.  We irrigated the wound copiously.  We closed the incision in a multilayer fashion with absorbable suture.  Sterile dressing was placed followed by well-padded splint.  Patient was awoken taken to PACU in stable condition.   Post-operative plan:  The patient will be NWB on the operative extremity Discharge home from the PACU once they have recovered DVT prophylaxis Aspirin 81 mg twice daily for 6 weeks.    Pain control with PRN pain medication preferring oral medicines.   Follow up plan will be scheduled in approximately 10-14 days for incision check and XR.

## 2023-03-23 NOTE — Anesthesia Postprocedure Evaluation (Signed)
Anesthesia Post Note  Patient: Alicia Neal  Procedure(s) Performed: OPEN REDUCTION INTERNAL FIXATION (ORIF) ANKLE FRACTURE (Left: Ankle)  Patient location during evaluation: PACU Anesthesia Type: General Level of consciousness: awake and alert and oriented Pain management: pain level controlled Vital Signs Assessment: post-procedure vital signs reviewed and stable Respiratory status: spontaneous breathing, nonlabored ventilation and respiratory function stable Cardiovascular status: blood pressure returned to baseline and stable Postop Assessment: no apparent nausea or vomiting Anesthetic complications: no  No notable events documented.   Last Vitals:  Vitals:   03/23/23 1030 03/23/23 1050  BP: (!) 96/49 106/61  Pulse: (!) 56 62  Resp: 14 15  Temp:  36.6 C  SpO2: 100% 98%    Last Pain:  Vitals:   03/23/23 1100  TempSrc:   PainSc: 0-No pain                 Bruce Mayers C Jakell Trusty

## 2023-03-23 NOTE — Progress Notes (Signed)
Left Popilteal nerve block  Time out  7:07  Start  7:10 Stop  7:16  Salphanous nerve

## 2023-03-24 ENCOUNTER — Other Ambulatory Visit: Payer: Self-pay | Admitting: Nurse Practitioner

## 2023-03-24 DIAGNOSIS — I10 Essential (primary) hypertension: Secondary | ICD-10-CM

## 2023-03-28 ENCOUNTER — Encounter (HOSPITAL_COMMUNITY): Payer: Self-pay | Admitting: Orthopedic Surgery

## 2023-03-28 ENCOUNTER — Other Ambulatory Visit (HOSPITAL_COMMUNITY): Payer: Self-pay

## 2023-03-30 ENCOUNTER — Other Ambulatory Visit (HOSPITAL_COMMUNITY): Payer: Self-pay

## 2023-03-30 ENCOUNTER — Other Ambulatory Visit: Payer: Self-pay | Admitting: Nurse Practitioner

## 2023-03-30 DIAGNOSIS — I10 Essential (primary) hypertension: Secondary | ICD-10-CM

## 2023-03-30 NOTE — Telephone Encounter (Signed)
Needs OV first- long time since she was seen here.

## 2023-03-31 ENCOUNTER — Encounter: Payer: Self-pay | Admitting: Family Medicine

## 2023-03-31 ENCOUNTER — Telehealth: Payer: Commercial Managed Care - PPO | Admitting: Family Medicine

## 2023-03-31 ENCOUNTER — Other Ambulatory Visit (HOSPITAL_COMMUNITY): Payer: Self-pay

## 2023-03-31 ENCOUNTER — Other Ambulatory Visit: Payer: Self-pay

## 2023-03-31 VITALS — Ht 62.0 in | Wt 174.0 lb

## 2023-03-31 DIAGNOSIS — R55 Syncope and collapse: Secondary | ICD-10-CM | POA: Diagnosis not present

## 2023-03-31 DIAGNOSIS — E1159 Type 2 diabetes mellitus with other circulatory complications: Secondary | ICD-10-CM | POA: Diagnosis not present

## 2023-03-31 DIAGNOSIS — E559 Vitamin D deficiency, unspecified: Secondary | ICD-10-CM

## 2023-03-31 DIAGNOSIS — I10 Essential (primary) hypertension: Secondary | ICD-10-CM | POA: Diagnosis not present

## 2023-03-31 DIAGNOSIS — E785 Hyperlipidemia, unspecified: Secondary | ICD-10-CM

## 2023-03-31 DIAGNOSIS — S82892S Other fracture of left lower leg, sequela: Secondary | ICD-10-CM

## 2023-03-31 DIAGNOSIS — J309 Allergic rhinitis, unspecified: Secondary | ICD-10-CM | POA: Diagnosis not present

## 2023-03-31 MED ORDER — PREDNISONE 5 MG PO TABS
5.0000 mg | ORAL_TABLET | Freq: Two times a day (BID) | ORAL | 0 refills | Status: AC
  Filled 2023-03-31: qty 10, 5d supply, fill #0

## 2023-03-31 MED ORDER — MONTELUKAST SODIUM 10 MG PO TABS
10.0000 mg | ORAL_TABLET | Freq: Every day | ORAL | 1 refills | Status: DC
  Filled 2023-03-31: qty 90, 90d supply, fill #0

## 2023-03-31 MED ORDER — FLUTICASONE PROPIONATE 50 MCG/ACT NA SUSP
2.0000 | Freq: Every day | NASAL | 6 refills | Status: DC
  Filled 2023-03-31: qty 16, 30d supply, fill #0
  Filled 2023-04-24: qty 16, 30d supply, fill #1
  Filled 2023-10-13: qty 16, 30d supply, fill #2

## 2023-03-31 MED ORDER — AZELASTINE HCL 0.1 % NA SOLN
2.0000 | Freq: Two times a day (BID) | NASAL | 6 refills | Status: DC
  Filled 2023-03-31: qty 30, 25d supply, fill #0
  Filled 2023-04-24: qty 30, 25d supply, fill #1

## 2023-03-31 NOTE — Patient Instructions (Signed)
Follow-up in office in 14 weeks, call if you need me sooner.  You are being referred for carotid Doppler study to evaluate circulation to your brain.  Fasting lipid HbA1c TSH and vitamin D is ordered for you to have this done at the hospital next week.  For uncontrolled  allergies increase Claritin to twice daily.  New medications to use every day for your allergies long-term Flonase nasal spray 2 puffs daily Astelin nasal spray 2 puffs twice daily and montelukast 10 mg 1 tablet daily.  Prednisone twice daily short-term for 5 days is also prescribed to help to reduce your allergy symptoms.  From the history provided the cause of the syncopal event definitely seems to be related to spasm that occurred while you were drinking during the meeting.  Be careful not to try to eat drink and or talk at the same time.

## 2023-04-01 ENCOUNTER — Encounter: Payer: Self-pay | Admitting: Family Medicine

## 2023-04-01 DIAGNOSIS — R55 Syncope and collapse: Secondary | ICD-10-CM | POA: Insufficient documentation

## 2023-04-01 DIAGNOSIS — S82892A Other fracture of left lower leg, initial encounter for closed fracture: Secondary | ICD-10-CM | POA: Insufficient documentation

## 2023-04-01 MED ORDER — AMLODIPINE BESYLATE 10 MG PO TABS
10.0000 mg | ORAL_TABLET | Freq: Every evening | ORAL | 3 refills | Status: DC
Start: 2023-04-01 — End: 2023-04-05
  Filled 2023-04-01: qty 90, 90d supply, fill #0

## 2023-04-01 NOTE — Assessment & Plan Note (Signed)
Controlled, no change in medication DASH diet and commitment to daily physical activity for a minimum of 30 minutes discussed and encouraged, as a part of hypertension management. The importance of attaining a healthy weight is also discussed.     03/31/2023    8:19 AM 03/23/2023   10:50 AM 03/23/2023   10:30 AM 03/23/2023   10:15 AM 03/23/2023   10:00 AM 03/23/2023    9:45 AM 03/23/2023    9:39 AM  BP/Weight  Systolic BP  106 96 88 84 86 96  Diastolic BP  61 49 57 47 40 53  Wt. (Lbs) 174        BMI 31.83 kg/m2

## 2023-04-01 NOTE — Assessment & Plan Note (Signed)
Updated lab needed at/ before next visit.   

## 2023-04-01 NOTE — Assessment & Plan Note (Signed)
Alicia Neal is reminded of the importance of commitment to daily physical activity for 30 minutes or more, as able and the need to limit carbohydrate intake to 30 to 60 grams per meal to help with blood sugar control.   The need to take medication as prescribed, test blood sugar as directed, and to call between visits if there is a concern that blood sugar is uncontrolled is also discussed.   Alicia Neal is reminded of the importance of daily foot exam, annual eye examination, and good blood sugar, blood pressure and cholesterol control.     Latest Ref Rng & Units 03/14/2023    4:02 PM 11/28/2022    8:49 AM 11/28/2022    8:48 AM 08/11/2022    8:36 AM 08/03/2022    9:50 AM  Diabetic Labs  HbA1c 4.8 - 5.6 %   6.5  6.3    Micro/Creat Ratio 0 - 29 mg/g creat     <16   Creatinine 0.44 - 1.00 mg/dL 4.09  8.11   9.14        03/31/2023    8:19 AM 03/23/2023   10:50 AM 03/23/2023   10:30 AM 03/23/2023   10:15 AM 03/23/2023   10:00 AM 03/23/2023    9:45 AM 03/23/2023    9:39 AM  BP/Weight  Systolic BP  106 96 88 84 86 96  Diastolic BP  61 49 57 47 40 53  Wt. (Lbs) 174        BMI 31.83 kg/m2            Latest Ref Rng & Units 03/08/2023   12:00 AM 08/03/2022    8:20 AM  Foot/eye exam completion dates  Eye Exam No Retinopathy No Retinopathy       Foot Form Completion   Done     This result is from an external source.      Updated lab needed

## 2023-04-01 NOTE — Assessment & Plan Note (Signed)
Unwitnessed, associated with feeling of throat tightening when attempting to swallow  Carotid doppler and presentation to Cardiology, for heart monitoring

## 2023-04-01 NOTE — Assessment & Plan Note (Signed)
Currently uncontrolled.    .  Prednisone prescribed.  Patient to double Claritin to twice daily and start daily Astelin and Flonase nasal sprays.

## 2023-04-01 NOTE — Assessment & Plan Note (Signed)
ORIF on 03/15/2023, no complications and healing

## 2023-04-01 NOTE — Progress Notes (Signed)
Virtual Visit via Video Note  I connected with Alicia Neal on 04/01/23 at  8:20 AM EDT by a video enabled telemedicine application and verified that I am speaking with the correct person using two identifiers.  Location: Patient: home Provider: office   I discussed the limitations of evaluation and management by telemedicine and the availability of in person appointments. The patient expressed understanding and agreed to proceed.  History of Present Illness: Video visit for follow-up of chronic medical problems hypertension diabetes And hyperlipidemia. Complains of 2-week history of increasing uncontrolled allergy symptoms involving head and chest congestion with cough.  Also experiencing increased drainage and itchy eyes.  She denies any fever or chills. Syncopal episode occurred during a zoom meeting on 5/7 resulting in a left ankle fracture Reports feeling her throat tightening and then landing on the floor she was able to get herself to offer him and when she attempted to stand from she was unable to sustained a closed left ankle fracture, no abnormality on brain scan, no abnormality while being monitored in the Ed for abnormal rhythm. Has had ORIF of left ankle on 5/8 andis recovering at home  Observations/Objective:  Ht 5\' 2"  (1.575 m)   Wt 174 lb (78.9 kg)   BMI 31.83 kg/m  Good communication with no confusion and intact memory. Alert and oriented x 3 Nasal and head congestionapparent in speech, intermittent coughoing spells and noted to be sipping fluid intermittently during interview  Assessment and Plan: Hyperlipidemia LDL goal <100 Hyperlipidemia:Low fat diet discussed and encouraged.   Lipid Panel  Lab Results  Component Value Date   CHOL 141 07/27/2022   HDL 52 07/27/2022   LDLCALC 67 07/27/2022   TRIG 111 07/27/2022   CHOLHDL 2.7 07/27/2022     Updated lab needed at/ before next visit. Controlled when last checked  HTN, goal below 130/80 Controlled,  no change in medication DASH diet and commitment to daily physical activity for a minimum of 30 minutes discussed and encouraged, as a part of hypertension management. The importance of attaining a healthy weight is also discussed.     03/31/2023    8:19 AM 03/23/2023   10:50 AM 03/23/2023   10:30 AM 03/23/2023   10:15 AM 03/23/2023   10:00 AM 03/23/2023    9:45 AM 03/23/2023    9:39 AM  BP/Weight  Systolic BP  106 96 88 84 86 96  Diastolic BP  61 49 57 47 40 53  Wt. (Lbs) 174        BMI 31.83 kg/m2             Vitamin D deficiency Updated lab needed at/ before next visit.   Type 2 diabetes mellitus with vascular disease (HCC) Ms. Ordiway is reminded of the importance of commitment to daily physical activity for 30 minutes or more, as able and the need to limit carbohydrate intake to 30 to 60 grams per meal to help with blood sugar control.   The need to take medication as prescribed, test blood sugar as directed, and to call between visits if there is a concern that blood sugar is uncontrolled is also discussed.   Ms. Jurkiewicz is reminded of the importance of daily foot exam, annual eye examination, and good blood sugar, blood pressure and cholesterol control.     Latest Ref Rng & Units 03/14/2023    4:02 PM 11/28/2022    8:49 AM 11/28/2022    8:48 AM 08/11/2022    8:36 AM  08/03/2022    9:50 AM  Diabetic Labs  HbA1c 4.8 - 5.6 %   6.5  6.3    Micro/Creat Ratio 0 - 29 mg/g creat     <16   Creatinine 0.44 - 1.00 mg/dL 1.61  0.96   0.45        03/31/2023    8:19 AM 03/23/2023   10:50 AM 03/23/2023   10:30 AM 03/23/2023   10:15 AM 03/23/2023   10:00 AM 03/23/2023    9:45 AM 03/23/2023    9:39 AM  BP/Weight  Systolic BP  106 96 88 84 86 96  Diastolic BP  61 49 57 47 40 53  Wt. (Lbs) 174        BMI 31.83 kg/m2            Latest Ref Rng & Units 03/08/2023   12:00 AM 08/03/2022    8:20 AM  Foot/eye exam completion dates  Eye Exam No Retinopathy No Retinopathy       Foot Form  Completion   Done     This result is from an external source.      Updated lab needed   Allergic rhinitis Currently uncontrolled.    .  Prednisone prescribed.  Patient to double Claritin to twice daily and start daily Astelin and Flonase nasal sprays.    Closed left ankle fracture ORIF on 03/15/2023, no complications and healing  Syncope and collapse Unwitnessed, associated with feeling of throat tightening when attempting to swallow  Carotid doppler and presentation to Cardiology, for heart monitoring   Follow Up Instructions:    I discussed the assessment and treatment plan with the patient. The patient was provided an opportunity to ask questions and all were answered. The patient agreed with the plan and demonstrated an understanding of the instructions.   The patient was advised to call back or seek an in-person evaluation if the symptoms worsen or if the condition fails to improve as anticipated.  I provided 22 minutes of non-face-to-face time during this encounter.   Syliva Overman, MD

## 2023-04-01 NOTE — Assessment & Plan Note (Signed)
Hyperlipidemia:Low fat diet discussed and encouraged.   Lipid Panel  Lab Results  Component Value Date   CHOL 141 07/27/2022   HDL 52 07/27/2022   LDLCALC 67 07/27/2022   TRIG 111 07/27/2022   CHOLHDL 2.7 07/27/2022     Updated lab needed at/ before next visit. Controlled when last checked

## 2023-04-04 ENCOUNTER — Other Ambulatory Visit (HOSPITAL_COMMUNITY): Payer: Self-pay

## 2023-04-04 ENCOUNTER — Other Ambulatory Visit: Payer: Self-pay

## 2023-04-04 DIAGNOSIS — E559 Vitamin D deficiency, unspecified: Secondary | ICD-10-CM

## 2023-04-04 DIAGNOSIS — E1159 Type 2 diabetes mellitus with other circulatory complications: Secondary | ICD-10-CM

## 2023-04-04 DIAGNOSIS — E785 Hyperlipidemia, unspecified: Secondary | ICD-10-CM

## 2023-04-04 DIAGNOSIS — I10 Essential (primary) hypertension: Secondary | ICD-10-CM

## 2023-04-05 ENCOUNTER — Other Ambulatory Visit: Payer: Self-pay | Admitting: Nurse Practitioner

## 2023-04-05 ENCOUNTER — Other Ambulatory Visit: Payer: Self-pay

## 2023-04-05 ENCOUNTER — Telehealth: Payer: Self-pay | Admitting: Family Medicine

## 2023-04-05 ENCOUNTER — Other Ambulatory Visit (HOSPITAL_COMMUNITY): Payer: Self-pay

## 2023-04-05 ENCOUNTER — Other Ambulatory Visit (HOSPITAL_BASED_OUTPATIENT_CLINIC_OR_DEPARTMENT_OTHER): Payer: Self-pay

## 2023-04-05 DIAGNOSIS — I10 Essential (primary) hypertension: Secondary | ICD-10-CM

## 2023-04-05 MED ORDER — TIRZEPATIDE 7.5 MG/0.5ML ~~LOC~~ SOAJ
7.5000 mg | SUBCUTANEOUS | 1 refills | Status: DC
  Filled 2023-04-05 (×2): qty 2, 28d supply, fill #0

## 2023-04-05 MED ORDER — AMLODIPINE BESYLATE 10 MG PO TABS
10.0000 mg | ORAL_TABLET | Freq: Every evening | ORAL | 3 refills | Status: DC
Start: 2023-04-05 — End: 2023-12-06
  Filled 2023-04-05 – 2023-06-28 (×2): qty 90, 90d supply, fill #0
  Filled 2023-09-07: qty 90, 90d supply, fill #1

## 2023-04-05 NOTE — Telephone Encounter (Signed)
Patient called having issues with her prescriptions.  Diabetic medicine not able to get it now for 2 weeks , [facility] said they can not get it , needs another dose prescription called Mounjaro 10 mg (only have the 12.5 mg and 7.5 mg only)  and also need amLODipine (NORVASC) 10 MG tablet [[id number]   Contact patient at [phone].  Pharmacy  [facility] [address], [place] [address] Phone: [phone]  Fax: [phone] DEA #: [id number]

## 2023-04-05 NOTE — Telephone Encounter (Signed)
7.5 mg sent to [facility]

## 2023-04-06 ENCOUNTER — Other Ambulatory Visit (HOSPITAL_COMMUNITY): Payer: Self-pay

## 2023-04-06 ENCOUNTER — Other Ambulatory Visit: Payer: Self-pay

## 2023-04-07 ENCOUNTER — Other Ambulatory Visit (HOSPITAL_COMMUNITY)
Admission: RE | Admit: 2023-04-07 | Discharge: 2023-04-07 | Disposition: A | Discharge status: Home / Self Care | Payer: Commercial Managed Care - PPO | Source: Ambulatory Visit | Attending: Family Medicine | Admitting: Family Medicine

## 2023-04-07 ENCOUNTER — Other Ambulatory Visit (HOSPITAL_COMMUNITY): Payer: Self-pay

## 2023-04-07 ENCOUNTER — Other Ambulatory Visit (INDEPENDENT_AMBULATORY_CARE_PROVIDER_SITE_OTHER): Payer: Commercial Managed Care - PPO

## 2023-04-07 ENCOUNTER — Encounter: Payer: Self-pay | Admitting: Orthopedic Surgery

## 2023-04-07 ENCOUNTER — Ambulatory Visit (INDEPENDENT_AMBULATORY_CARE_PROVIDER_SITE_OTHER): Payer: Commercial Managed Care - PPO | Admitting: Orthopedic Surgery

## 2023-04-07 ENCOUNTER — Other Ambulatory Visit: Payer: Self-pay | Admitting: Family Medicine

## 2023-04-07 DIAGNOSIS — E782 Mixed hyperlipidemia: Secondary | ICD-10-CM

## 2023-04-07 DIAGNOSIS — E559 Vitamin D deficiency, unspecified: Secondary | ICD-10-CM | POA: Insufficient documentation

## 2023-04-07 DIAGNOSIS — E1159 Type 2 diabetes mellitus with other circulatory complications: Secondary | ICD-10-CM | POA: Insufficient documentation

## 2023-04-07 DIAGNOSIS — S82842D Displaced bimalleolar fracture of left lower leg, subsequent encounter for closed fracture with routine healing: Secondary | ICD-10-CM

## 2023-04-07 LAB — LIPID PANEL
Cholesterol: 175 mg/dL (ref 0–200)
HDL: 53 mg/dL (ref >40)
LDL Cholesterol: 88 mg/dL (ref 0–99)
Total CHOL/HDL Ratio: 3.3 RATIO
Triglycerides: 172 mg/dL — ABNORMAL HIGH (ref <150)
VLDL: 34 mg/dL (ref 0–40)

## 2023-04-07 LAB — TSH: TSH: 2.071 u[IU]/mL (ref 0.350–4.500)

## 2023-04-07 LAB — VITAMIN D 25 HYDROXY (VIT D DEFICIENCY, FRACTURES): Vit D, 25-Hydroxy: 67.88 ng/mL (ref 30–100)

## 2023-04-07 NOTE — Progress Notes (Signed)
Orthopaedic Postop Note  Assessment: Alicia Neal is a 62 y.o. female s/p ORIF of Left ankle fracture  DOS: 03/23/2023  Plan: Sutures removed, steri strips placed Well padded short leg cast placed in clinic today NWB on the operative extremity Continue to take Aspirin 81 mg BID  Cast application - Left short leg cast   Verbal consent was obtained and the correct extremity was identified. A well padded, appropriately molded short leg cast was applied to the Left leg Fingers remained warm and well perfused.   There were no sharp edges Patient tolerated the procedure well Cast care instructions were provided     Follow-up: Return in about 2 weeks (around 04/21/2023). XR at next visit: Left ankle  Subjective:  Chief Complaint  Patient presents with   Routine Post Op    L ankle ORIF DOS 03/23/23    History of Present Illness: Alicia Neal is a 62 y.o. female who presents following the above stated procedure.  Surgery was approximately 2 weeks ago.  She is doing very well.  She states that she has not needed any pain medication.  The block lasted for about 48 hours.  Since then, she has been using Tylenol.  Over the past couple of days, she notes some shooting pains into her foot, but no pain otherwise.  She does have a little bit of numbness on the dorsum of her foot.  She continues to take the aspirin.  She is using a knee rollator, and doing well.  Review of Systems: No fevers or chills + numbness or tingling No Chest Pain No shortness of breath   Objective: There were no vitals taken for this visit.  Physical Exam:  Alert and oriented.  No acute distress.  Ambulating effectively with a knee rollator  Medial and lateral surgical incisions are healing.  No surrounding erythema or drainage.  She is intact dorsiflexion of the ankle and the great toe.  Decreased sensation over the dorsum of the foot, as well as the first webspace.  Toes warm and well-perfused.  No  tenderness to palpation.  Minimal swelling overall.  IMAGING: I personally ordered and reviewed the following images  There is a left ankle were obtained in clinic today.  These are compared to postop x-rays.  There has been no interval displacement of the fractures.  Hardware remains intact.  Screws are not backing out.  The mortise is congruent.  No syndesmotic disruption.  Fracture lines remain visible.  Impression: Healing left bimalleolar ankle fracture without hardware failure following operative fixation.   Oliver Barre, MD 04/07/2023 12:18 PM

## 2023-04-07 NOTE — Patient Instructions (Signed)

## 2023-04-08 LAB — HEMOGLOBIN A1C
Hgb A1c MFr Bld: 6.7 % — ABNORMAL HIGH (ref 4.8–5.6)
Mean Plasma Glucose: 146 mg/dL

## 2023-04-17 ENCOUNTER — Other Ambulatory Visit: Payer: Self-pay | Admitting: Family Medicine

## 2023-04-17 ENCOUNTER — Other Ambulatory Visit: Payer: Self-pay

## 2023-04-17 DIAGNOSIS — Z9189 Other specified personal risk factors, not elsewhere classified: Secondary | ICD-10-CM

## 2023-04-17 DIAGNOSIS — R55 Syncope and collapse: Secondary | ICD-10-CM

## 2023-04-17 MED ORDER — REPATHA SURECLICK 140 MG/ML ~~LOC~~ SOAJ
140.0000 mg | SUBCUTANEOUS | 11 refills | Status: DC
  Filled 2023-04-17: qty 2, 28d supply, fill #0
  Filled 2023-05-09 – 2023-05-12 (×2): qty 2, 28d supply, fill #1
  Filled 2023-06-08: qty 2, 28d supply, fill #2
  Filled 2023-07-06: qty 2, 28d supply, fill #3
  Filled 2023-08-02: qty 2, 28d supply, fill #4
  Filled 2023-08-26: qty 2, 28d supply, fill #5
  Filled 2023-09-30: qty 2, 28d supply, fill #6
  Filled 2023-10-25: qty 2, 28d supply, fill #7
  Filled 2023-11-22: qty 2, 28d supply, fill #8

## 2023-04-19 ENCOUNTER — Other Ambulatory Visit: Payer: Self-pay

## 2023-04-19 DIAGNOSIS — E1159 Type 2 diabetes mellitus with other circulatory complications: Secondary | ICD-10-CM

## 2023-04-19 DIAGNOSIS — E782 Mixed hyperlipidemia: Secondary | ICD-10-CM

## 2023-04-19 DIAGNOSIS — I10 Essential (primary) hypertension: Secondary | ICD-10-CM

## 2023-04-20 ENCOUNTER — Other Ambulatory Visit: Payer: Self-pay

## 2023-04-20 ENCOUNTER — Other Ambulatory Visit (HOSPITAL_COMMUNITY): Payer: Self-pay

## 2023-04-20 ENCOUNTER — Encounter: Payer: Self-pay | Admitting: Family Medicine

## 2023-04-20 MED ORDER — TIRZEPATIDE 7.5 MG/0.5ML ~~LOC~~ SOAJ
7.5000 mg | SUBCUTANEOUS | 1 refills | Status: DC
  Filled 2023-04-20: qty 2, 28d supply, fill #0
  Filled 2023-04-24: qty 6, 84d supply, fill #0
  Filled 2023-07-20: qty 6, 84d supply, fill #1

## 2023-04-21 ENCOUNTER — Other Ambulatory Visit (INDEPENDENT_AMBULATORY_CARE_PROVIDER_SITE_OTHER): Payer: Commercial Managed Care - PPO

## 2023-04-21 ENCOUNTER — Ambulatory Visit (INDEPENDENT_AMBULATORY_CARE_PROVIDER_SITE_OTHER): Payer: Commercial Managed Care - PPO | Admitting: Orthopedic Surgery

## 2023-04-21 ENCOUNTER — Encounter: Payer: Self-pay | Admitting: Orthopedic Surgery

## 2023-04-21 DIAGNOSIS — S82842D Displaced bimalleolar fracture of left lower leg, subsequent encounter for closed fracture with routine healing: Secondary | ICD-10-CM

## 2023-04-21 NOTE — Progress Notes (Signed)
Orthopaedic Postop Note  Assessment: Alicia Neal is a 62 y.o. female s/p ORIF of Left ankle fracture  DOS: 03/23/2023  Plan: Mrs. Clifford is doing well.  She has no pain.  Her swelling has improved.  Slightly decreased sensation over the dorsum of the foot.  No tenderness to palpation.  Radiographs are stable.  She is pushing to start bearing weight.  I have urged her to remain nonweightbearing.  We will place her in a walking boot, but she is to remain nonweightbearing for the next 2 weeks.  Okay to remove the boot for hygiene and range of motion.  We discussed some simple exercises for range of motion in clinic today.  In 2 weeks time, she can start bearing weight while in the boot.  I would like to see her back in 1 month for repeat evaluation.  At that time, we will be able to discuss transitioning back into a regular shoe.  She will contact the clinic if she has any further issues.   Follow-up: Return in about 4 weeks (around 05/19/2023). XR at next visit: Left ankle  Subjective:  Chief Complaint  Patient presents with   Follow-up    Recheck on left ankle fracture, DOS 03-23-23.    History of Present Illness: Alicia Neal is a 62 y.o. female who presents following the above stated procedure.  Surgery was approximately 4 weeks ago.  She has been immobilized in a splint, then a cast.  She tolerated the cast well.  She remains nonweightbearing.  She is using a knee rollator, and this is kept her mobile.  No pain medications.  She is anxious to start bearing weight.  She does have some decrease sensation to the dorsum of the foot.   Review of Systems: No fevers or chills + numbness or tingling No Chest Pain No shortness of breath   Objective: There were no vitals taken for this visit.  Physical Exam:  Alert and oriented.  No acute distress.  Ambulating effectively with a knee rollator  Surgical incisions are healing.  No surrounding erythema or drainage.  She  tolerates gentle range of motion of the ankle.  Active range of motion of the ankle, including dorsiflexion, plantarflexion, inversion and eversion.  Toes warm well-perfused.  Sensation intact in the first webspace.  Slightly decreased sensation over the dorsum of the ankle.  IMAGING: I personally ordered and reviewed the following images  X-rays of the left ankle were obtained in clinic today.  These are compared to prior x-rays.  There has been no interval displacement of the fractures.  Overall alignment remains unchanged.  No evidence of hardware failure.  Screws are not backing out.  The mortise is congruent.  No syndesmotic disruption.  Impression: Stable left ankle x-rays following operative fixation, without hardware failure.   Oliver Barre, MD 04/21/2023 12:09 PM

## 2023-04-21 NOTE — Patient Instructions (Signed)
Transition to a walking boot today.  NO weight bearing for the next 2 weeks.  Ok to remove the boot for hygiene and to work on range of motion.   In 2 weeks, you can start bearing weight while wearing the boot.  Follow up in 1 month

## 2023-04-24 ENCOUNTER — Other Ambulatory Visit: Payer: Self-pay

## 2023-04-25 ENCOUNTER — Other Ambulatory Visit (HOSPITAL_COMMUNITY): Payer: Self-pay

## 2023-04-26 ENCOUNTER — Other Ambulatory Visit (HOSPITAL_COMMUNITY): Payer: Self-pay

## 2023-04-28 ENCOUNTER — Other Ambulatory Visit: Payer: Self-pay

## 2023-05-02 ENCOUNTER — Encounter: Payer: Self-pay | Admitting: Family Medicine

## 2023-05-02 MED ORDER — ALPRAZOLAM 0.25 MG PO TABS
ORAL_TABLET | ORAL | 0 refills | Status: DC
Start: 2023-05-02 — End: 2023-05-19

## 2023-05-02 MED ORDER — TEMAZEPAM 7.5 MG PO CAPS: 7.5000 mg | ORAL_CAPSULE | Freq: Every evening | ORAL | 0 refills | Status: DC | PRN

## 2023-05-09 ENCOUNTER — Other Ambulatory Visit: Payer: Self-pay | Admitting: Family Medicine

## 2023-05-09 ENCOUNTER — Other Ambulatory Visit (HOSPITAL_COMMUNITY): Payer: Self-pay

## 2023-05-09 ENCOUNTER — Ambulatory Visit: Payer: Commercial Managed Care - PPO | Admitting: Internal Medicine

## 2023-05-09 MED ORDER — PANTOPRAZOLE SODIUM 40 MG PO TBEC
40.0000 mg | DELAYED_RELEASE_TABLET | Freq: Every day | ORAL | 3 refills | Status: DC
  Filled 2023-05-09: qty 90, fill #0
  Filled 2023-05-12 – 2023-07-20 (×7): qty 90, 90d supply, fill #0
  Filled 2023-10-13: qty 90, 90d supply, fill #1

## 2023-05-09 NOTE — Progress Notes (Signed)
Erroneous encounter - please disregard.

## 2023-05-10 ENCOUNTER — Other Ambulatory Visit (HOSPITAL_COMMUNITY): Payer: Self-pay

## 2023-05-12 ENCOUNTER — Other Ambulatory Visit: Payer: Self-pay

## 2023-05-12 ENCOUNTER — Other Ambulatory Visit (HOSPITAL_COMMUNITY): Payer: Self-pay

## 2023-05-18 ENCOUNTER — Other Ambulatory Visit: Payer: Self-pay | Admitting: Nurse Practitioner

## 2023-05-18 ENCOUNTER — Other Ambulatory Visit: Payer: Self-pay

## 2023-05-18 ENCOUNTER — Other Ambulatory Visit (HOSPITAL_COMMUNITY): Payer: Self-pay

## 2023-05-19 ENCOUNTER — Encounter: Payer: Self-pay | Admitting: Orthopedic Surgery

## 2023-05-19 ENCOUNTER — Ambulatory Visit (INDEPENDENT_AMBULATORY_CARE_PROVIDER_SITE_OTHER): Payer: Commercial Managed Care - PPO | Admitting: Orthopedic Surgery

## 2023-05-19 ENCOUNTER — Other Ambulatory Visit (INDEPENDENT_AMBULATORY_CARE_PROVIDER_SITE_OTHER): Payer: Commercial Managed Care - PPO

## 2023-05-19 DIAGNOSIS — S82842D Displaced bimalleolar fracture of left lower leg, subsequent encounter for closed fracture with routine healing: Secondary | ICD-10-CM | POA: Diagnosis not present

## 2023-05-19 NOTE — Progress Notes (Signed)
Orthopaedic Postop Note  Assessment: Alicia Neal is a 62 y.o. female s/p ORIF of Left ankle fracture  DOS: 03/23/2023  Plan: Alicia Neal is getting better.  She has returned to work.  She is ambulating well with a walking boot.  Radiographs are stable.  At this point, she can start to transition out of the walking boot.  She will continue working on range of motion.  I provided her with home exercises.  She can consider formal physical therapy.  She states her understanding.  I would like to see her back in approximately 6 weeks.   Follow-up: Return in about 6 weeks (around 06/30/2023). XR at next visit: Left ankle  Subjective:  No chief complaint on file.   History of Present Illness: Alicia Neal is a 62 y.o. female who presents following the above stated procedure.  Surgery was approximately 2 months ago.  She has started weightbearing in her walking boot.  She is doing well.  Pain is controlled.  She was complaining of some knee pain, but this has improved.  She continues to take the aspirin.  She has no numbness or tingling.  Review of Systems: No fevers or chills No numbness or tingling No Chest Pain No shortness of breath   Objective: There were no vitals taken for this visit.  Physical Exam:  Alert and oriented.  No acute distress.  Ambulating well with a walking boot  Evaluation left ankle demonstrates mild swelling.  Surgical incisions have healed well.  No surrounding erythema or drainage.  Sensation is intact over the dorsum of the foot.  Full sensation to the plantar aspect of the foot.  She is able to get to a plantigrade position.  Toes warm and well-perfused.  IMAGING: I personally ordered and reviewed the following images  X-rays of the left ankle were obtained in clinic today.  These are compared to available x-rays.  There has been no change in overall alignment.  Lateral plate and screws remain in unchanged position.  Screws are not backing out.   The mortise is congruent.  Medial screws are intact.  No syndesmotic disruption.  No new injuries.  Impression: Stable left bimalleolar ankle fracture, without hardware failure.   Oliver Barre, MD 05/19/2023 11:27 AM

## 2023-05-19 NOTE — Patient Instructions (Signed)

## 2023-05-20 ENCOUNTER — Other Ambulatory Visit (HOSPITAL_COMMUNITY): Payer: Self-pay

## 2023-05-22 ENCOUNTER — Other Ambulatory Visit (HOSPITAL_COMMUNITY): Payer: Self-pay

## 2023-05-22 ENCOUNTER — Other Ambulatory Visit: Payer: Self-pay

## 2023-06-08 ENCOUNTER — Other Ambulatory Visit: Payer: Self-pay

## 2023-06-08 ENCOUNTER — Other Ambulatory Visit (HOSPITAL_COMMUNITY): Payer: Self-pay

## 2023-06-15 ENCOUNTER — Other Ambulatory Visit: Payer: Self-pay

## 2023-06-15 ENCOUNTER — Ambulatory Visit: Payer: Commercial Managed Care - PPO | Admitting: Family Medicine

## 2023-06-15 ENCOUNTER — Encounter: Payer: Self-pay | Admitting: Family Medicine

## 2023-06-15 VITALS — BP 113/71 | HR 77 | Ht 62.0 in | Wt 174.1 lb

## 2023-06-15 DIAGNOSIS — E1159 Type 2 diabetes mellitus with other circulatory complications: Secondary | ICD-10-CM | POA: Diagnosis not present

## 2023-06-15 DIAGNOSIS — Z634 Disappearance and death of family member: Secondary | ICD-10-CM

## 2023-06-15 DIAGNOSIS — F5104 Psychophysiologic insomnia: Secondary | ICD-10-CM

## 2023-06-15 DIAGNOSIS — F4321 Adjustment disorder with depressed mood: Secondary | ICD-10-CM | POA: Diagnosis not present

## 2023-06-15 DIAGNOSIS — I10 Essential (primary) hypertension: Secondary | ICD-10-CM

## 2023-06-15 DIAGNOSIS — E782 Mixed hyperlipidemia: Secondary | ICD-10-CM | POA: Diagnosis not present

## 2023-06-15 DIAGNOSIS — Z01419 Encounter for gynecological examination (general) (routine) without abnormal findings: Secondary | ICD-10-CM

## 2023-06-15 MED ORDER — MELATONIN 3 MG PO TABS
3.0000 mg | ORAL_TABLET | Freq: Every day | ORAL | 3 refills | Status: DC
  Filled 2023-06-15: qty 60, 60d supply, fill #0

## 2023-06-15 MED ORDER — TEMAZEPAM 15 MG PO CAPS
15.0000 mg | ORAL_CAPSULE | Freq: Every evening | ORAL | 2 refills | Status: DC | PRN
  Filled 2023-06-15: qty 30, 30d supply, fill #0
  Filled 2023-08-06: qty 30, 30d supply, fill #1
  Filled 2023-09-07: qty 30, 30d supply, fill #2

## 2023-06-15 NOTE — Assessment & Plan Note (Signed)
Hyperlipidemia:Low fat diet discussed and encouraged.   Lipid Panel  Lab Results  Component Value Date   CHOL 175 04/07/2023   HDL 53 04/07/2023   LDLCALC 88 04/07/2023   TRIG 172 (H) 04/07/2023   CHOLHDL 3.3 04/07/2023     Updated lab needed at/ before next visit. Needs to reduce froied and fatty foods

## 2023-06-15 NOTE — Progress Notes (Signed)
Alicia Neal     MRN: [id number]      DOB: 1961-05-16  Chief Complaint  Patient presents with   Follow-up    Follow up     HPI Alicia Neal is here for follow up and re-evaluation of chronic medical conditions, medication management and review of any available recent lab and radiology data.  Preventive health needs to be updated, specifically  Cancer screening and referrals are being entered    The PT denies any adverse reactions to current medications since the last visit.  Alicia Neal is dealing with the very unfortunate experience of unexpectedly losing one of her 3 adult children in the past 2 months. The reality is still "sinking in" and she is being comforter in chief to her other 2 children, her grandchild and her daughter in law. Next week sjhewill be off and will have alone quiet time when she will spend time looking at his photos and doing other tasks to help I her healing She has not reached out for counseling as yet, but I do believe that she will benefit greatly as it will allow her a safe space to emote and grief, again, when and if she is ready She does have great support from family, friends , co workers Has taken xanax oncce, does not want to "depend on drugs" C/o very poor sleep, probably 2 to 3 hours per night Has good sleep hygiene, but when she does get into bed , her mind will not rest ROS Denies recent fever or chills. Denies sinus pressure, nasal congestion, ear pain or sore throat. Denies chest congestion, productive cough or wheezing. Denies chest pains, palpitations and leg swelling Denies abdominal pain, nausea, vomiting,diarrhea or constipation.   Denies dysuria, frequency, hesitancy or incontinence. Denies joint pain, swelling and limitation in mobility. Denies headaches, seizures, numbness, or tingling. . Denies skin break down or rash.   PE  BP 113/71 (BP Location: Right Arm, Patient Position: Sitting, Cuff Size: Normal)   Pulse 77   Ht 5\' 2"   (1.575 m)   Wt 174 lb 1.9 oz (79 kg)   SpO2 96%   BMI 31.85 kg/m   Patient alert and oriented and in no cardiopulmonary distress.  HEENT: No facial asymmetry, EOMI,     Neck supple .  Chest: Clear to auscultation bilaterally.  CVS: S1, S2 no murmurs, no S3.Regular rate.  Ext: No edema  Alicia: Adequate ROM spine, shoulders, hips and knees.  Skin: Intact, no ulcerations or rash noted.  Psych: Good eye contact,  not anxious or depressed appearing.Tearful at times as we speak about hr recent loss  CNS: CN 2-12 intact, .no focal deficits noted.   Assessment & Plan  Grief at loss of child Reports recently starting to eat again, has not depended on medication to dull her emotions, sleep is the major challenge and would appreciate help but with very mild medication as wants to avoid addiction. Encouraged  her to get therapy  HTN, goal below 130/80 Controlled, no change in medication DASH diet and commitment to daily physical activity for a minimum of 30 minutes discussed and encouraged, as a part of hypertension management. The importance of attaining a healthy weight is also discussed.     06/15/2023    8:44 AM 03/31/2023    8:19 AM 03/23/2023   10:50 AM 03/23/2023   10:30 AM 03/23/2023   10:15 AM 03/23/2023   10:00 AM 03/23/2023    9:45 AM  BP/Weight  Systolic  BP 113  106 96 88 84 86  Diastolic BP 71  61 49 57 47 40  Wt. (Lbs) 174.12 174       BMI 31.85 kg/m2 31.83 kg/m2            Mixed hyperlipidemia Hyperlipidemia:Low fat diet discussed and encouraged.   Lipid Panel  Lab Results  Component Value Date   CHOL 175 04/07/2023   HDL 53 04/07/2023   LDLCALC 88 04/07/2023   TRIG 172 (H) 04/07/2023   CHOLHDL 3.3 04/07/2023     Updated lab needed at/ before next visit. Needs to reduce froied and fatty foods  Type 2 diabetes mellitus with vascular disease (HCC) Alicia Neal is reminded of the importance of commitment to daily physical activity for 30 minutes or more,  as able and the need to limit carbohydrate intake to 30 to 60 grams per meal to help with blood sugar control.   The need to take medication as prescribed, test blood sugar as directed, and to call between visits if there is a concern that blood sugar is uncontrolled is also discussed.   Alicia Neal is reminded of the importance of daily foot exam, annual eye examination, and good blood sugar, blood pressure and cholesterol control.     Latest Ref Rng & Units 04/07/2023   11:17 AM 04/07/2023   11:16 AM 03/14/2023    4:02 PM 11/28/2022    8:49 AM 11/28/2022    8:48 AM  Diabetic Labs  HbA1c 4.8 - 5.6 % 6.7     6.5   Chol 0 - 200 mg/dL  086      HDL >57 mg/dL  53      Calc LDL 0 - 99 mg/dL  88      Triglycerides <150 mg/dL  846      Creatinine 9.62 - 1.00 mg/dL   9.52  8.41        01/07/4400    8:44 AM 03/31/2023    8:19 AM 03/23/2023   10:50 AM 03/23/2023   10:30 AM 03/23/2023   10:15 AM 03/23/2023   10:00 AM 03/23/2023    9:45 AM  BP/Weight  Systolic BP 113  027 96 88 84 86  Diastolic BP 71  61 49 57 47 40  Wt. (Lbs) 174.12 174       BMI 31.85 kg/m2 31.83 kg/m2           Latest Ref Rng & Units 03/08/2023   12:00 AM 08/03/2022    8:20 AM  Foot/eye exam completion dates  Eye Exam No Retinopathy No Retinopathy       Foot Form Completion   Done     This result is from an external source.      Controlled, no change in medication Updated lab needed at/ before next visit.   Insomnia Increased difficulty sleeping since loss of child Sleep hygiene reviewed and written information offered also. Prescription sent for  medication needed. Increase med dose and add melatonin if needed

## 2023-06-15 NOTE — Assessment & Plan Note (Signed)
Controlled, no change in medication DASH diet and commitment to daily physical activity for a minimum of 30 minutes discussed and encouraged, as a part of hypertension management. The importance of attaining a healthy weight is also discussed.     06/15/2023    8:44 AM 03/31/2023    8:19 AM 03/23/2023   10:50 AM 03/23/2023   10:30 AM 03/23/2023   10:15 AM 03/23/2023   10:00 AM 03/23/2023    9:45 AM  BP/Weight  Systolic BP 113  782 96 88 84 86  Diastolic BP 71  61 49 57 47 40  Wt. (Lbs) 174.12 174       BMI 31.85 kg/m2 31.83 kg/m2

## 2023-06-15 NOTE — Assessment & Plan Note (Signed)
Reports recently starting to eat again, has not depended on medication to dull her emotions, sleep is the major challenge and would appreciate help but with very mild medication as wants to avoid addiction. Encouraged  her to get therapy

## 2023-06-15 NOTE — Assessment & Plan Note (Signed)
Increased difficulty sleeping since loss of child Sleep hygiene reviewed and written information offered also. Prescription sent for  medication needed. Increase med dose and add melatonin if needed

## 2023-06-15 NOTE — Assessment & Plan Note (Signed)
Alicia Neal is reminded of the importance of commitment to daily physical activity for 30 minutes or more, as able and the need to limit carbohydrate intake to 30 to 60 grams per meal to help with blood sugar control.   The need to take medication as prescribed, test blood sugar as directed, and to call between visits if there is a concern that blood sugar is uncontrolled is also discussed.   Alicia Neal is reminded of the importance of daily foot exam, annual eye examination, and good blood sugar, blood pressure and cholesterol control.     Latest Ref Rng & Units 04/07/2023   11:17 AM 04/07/2023   11:16 AM 03/14/2023    4:02 PM 11/28/2022    8:49 AM 11/28/2022    8:48 AM  Diabetic Labs  HbA1c 4.8 - 5.6 % 6.7     6.5   Chol 0 - 200 mg/dL  981      HDL >19 mg/dL  53      Calc LDL 0 - 99 mg/dL  88      Triglycerides <150 mg/dL  147      Creatinine 8.29 - 1.00 mg/dL   5.62  1.30        06/13/5783    8:44 AM 03/31/2023    8:19 AM 03/23/2023   10:50 AM 03/23/2023   10:30 AM 03/23/2023   10:15 AM 03/23/2023   10:00 AM 03/23/2023    9:45 AM  BP/Weight  Systolic BP 113  696 96 88 84 86  Diastolic BP 71  61 49 57 47 40  Wt. (Lbs) 174.12 174       BMI 31.85 kg/m2 31.83 kg/m2           Latest Ref Rng & Units 03/08/2023   12:00 AM 08/03/2022    8:20 AM  Foot/eye exam completion dates  Eye Exam No Retinopathy No Retinopathy       Foot Form Completion   Done     This result is from an external source.      Controlled, no change in medication Updated lab needed at/ before next visit.

## 2023-06-15 NOTE — Patient Instructions (Signed)
F/U early to mid October, call if you need me sooner  Please get fasting lipid, cmp and eGFR and hBa1C 3 to 5 days before next appt  You are referred for pap  Increase dose of temazepam for sleep , trial of melatonin  Thanks for choosing [facility], we consider it a privelige to serve you.

## 2023-06-16 ENCOUNTER — Other Ambulatory Visit: Payer: Self-pay

## 2023-06-28 ENCOUNTER — Other Ambulatory Visit: Payer: Self-pay

## 2023-06-30 ENCOUNTER — Encounter: Payer: Self-pay | Admitting: Orthopedic Surgery

## 2023-06-30 ENCOUNTER — Ambulatory Visit: Payer: Commercial Managed Care - PPO | Admitting: Orthopedic Surgery

## 2023-06-30 ENCOUNTER — Other Ambulatory Visit: Payer: Self-pay

## 2023-06-30 DIAGNOSIS — S82842D Displaced bimalleolar fracture of left lower leg, subsequent encounter for closed fracture with routine healing: Secondary | ICD-10-CM

## 2023-06-30 NOTE — Progress Notes (Signed)
Orthopaedic Postop Note  Assessment: Alicia Neal is a 62 y.o. female s/p ORIF of Left ankle fracture  DOS: 03/23/2023  Plan: Alicia Neal has done well in her recovery.  She is ambulating well with high heel shoes on.  She does continue to have some swelling, which is normal.  It improves when she gets off her feet.  She does continue to have some tightness in the heel cord.  I stressed the importance of continuing to stretch this out.  Walking in flat shoes can help with the general stretching.  She states her understanding.  If she has any issues, she will contact the clinic.  She is comfortable following up as needed.  Follow-up: Return if symptoms worsen or fail to improve. XR at next visit: Left ankle  Subjective:  Chief Complaint  Patient presents with   Routine Post Op    L ankle DOS 03/23/23    History of Present Illness: Alicia Neal is a 62 y.o. female who presents following the above stated procedure.  Surgery was approximately 3 months ago.  She is ambulating well in a regular shoe.  She does note some swelling as the day progresses.  She also notes some stiffness earlier in the day.  She is not taking medicines for her ankle.  She is doing some range of motion activities.  She is hoping to return to regular walking, and states that she was walking 3 miles daily prior to the injury.   Review of Systems: No fevers or chills No numbness or tingling No Chest Pain No shortness of breath   Objective: There were no vitals taken for this visit.  Physical Exam:  Alert and oriented.  No acute distress.  Steady gait in high-heeled shoes.  Evaluation of the left ankle demonstrates well-healed surgical incisions.  No surrounding erythema or drainage.  No point tenderness.  Mild diffuse swelling.  She can get to a plantigrade position, with only few degrees of dorsiflexion with the knee bent.  Toes are warm and well-perfused.  Sensation intact over the dorsum of the  foot.  IMAGING: I personally ordered and reviewed the following images  X-rays of the left ankle were obtained in clinic today.  These are compared to prior x-rays.  No change in overall alignment.  Plate and screw construct remains stable.  No evidence of screws backing out.  No subsidence of the fracture.  The mortise is congruent.  No syndesmotic disruption.  Impression: Healed left bimalleolar ankle fracture, without hardware failure.   Oliver Barre, MD 06/30/2023 10:59 AM

## 2023-07-03 ENCOUNTER — Other Ambulatory Visit (HOSPITAL_COMMUNITY)
Admission: RE | Admit: 2023-07-03 | Discharge: 2023-07-03 | Disposition: A | Discharge status: Home / Self Care | Payer: Commercial Managed Care - PPO | Source: Ambulatory Visit | Attending: Women's Health | Admitting: Women's Health

## 2023-07-03 ENCOUNTER — Ambulatory Visit (INDEPENDENT_AMBULATORY_CARE_PROVIDER_SITE_OTHER): Payer: Commercial Managed Care - PPO | Admitting: Women's Health

## 2023-07-03 ENCOUNTER — Encounter: Payer: Self-pay | Admitting: Women's Health

## 2023-07-03 VITALS — BP 134/80 | HR 68 | Ht 61.0 in | Wt 173.6 lb

## 2023-07-03 DIAGNOSIS — Z01419 Encounter for gynecological examination (general) (routine) without abnormal findings: Secondary | ICD-10-CM | POA: Insufficient documentation

## 2023-07-03 DIAGNOSIS — N3941 Urge incontinence: Secondary | ICD-10-CM | POA: Diagnosis not present

## 2023-07-03 DIAGNOSIS — B372 Candidiasis of skin and nail: Secondary | ICD-10-CM

## 2023-07-03 DIAGNOSIS — Z124 Encounter for screening for malignant neoplasm of cervix: Secondary | ICD-10-CM

## 2023-07-03 NOTE — Progress Notes (Signed)
WELL-WOMAN EXAMINATION Patient name: Alicia Neal MRN [id number]  Date of birth: September 08, 1961 Chief Complaint:   Annual Exam  History of Present Illness:   Alicia Neal is a 62 y.o. G2P2 Caucasian female being seen today for a routine well-woman exam. Retiring in 2022-11-08! Son died few months ago, some days harder than others, declines therapy/counseling right now. Still some urinary incontinence but seems to be improving.  Current complaints: red itchy area under skin on abdomen for awhile  PCP: Lodema Hong      does not desire labs No LMP recorded. Patient is postmenopausal. The current method of family planning is post menopausal status.  Last pap 07/06/20. Results were: NILM w/ HRHPV negative. H/O abnormal pap: h/o abnormal pap and cryosurgery w/ multiple subsequent normal paps Last mammogram: 02/22/23. Results were: normal. Family h/o breast cancer: no Last colonoscopy: 12/23/19. Results were: abnormal tubular adenoma polyps removed, f/u 92yrs  Family h/o colorectal cancer: yes maternal grandparents and MGGM      07/03/2023    9:44 AM 06/15/2023    8:46 AM 11/29/2022    8:25 AM 08/03/2022    8:36 AM 07/19/2022    8:37 AM  Depression screen PHQ 2/9  Decreased Interest 3 3 0 0 0  Down, Depressed, Hopeless 3 3 0 0 0  PHQ - 2 Score 6 6 0 0 0  Altered sleeping 3 3  0 1  Tired, decreased energy 3 3  0 0  Change in appetite 0 1  0 0  Feeling bad or failure about yourself  0 0  0 1  Trouble concentrating 3 3  0 0  Moving slowly or fidgety/restless 0 0  0 0  Suicidal thoughts 0 0  0 0  PHQ-9 Score 15 16  0 2  Difficult doing work/chores  Not difficult at all           07/03/2023    9:45 AM 06/15/2023    8:46 AM 11/29/2022    8:25 AM 07/19/2022    8:37 AM  GAD 7 : Generalized Anxiety Score  Nervous, Anxious, on Edge 3 2 1 2   Control/stop worrying 3 0 2 2  Worry too much - different things 3 0 2 2  Trouble relaxing 0 0 2 1  Restless 0 0 0 0  Easily annoyed or irritable 1 0 1 2  Afraid -  awful might happen 0 0 2 3  Total GAD 7 Score 10 2 10 12   Anxiety Difficulty  Not difficult at all Not difficult at all      Review of Systems:   Pertinent items are noted in HPI Denies any headaches, blurred vision, fatigue, shortness of breath, chest pain, abdominal pain, abnormal vaginal discharge/itching/odor/irritation, problems with periods, bowel movements, urination, or intercourse unless otherwise stated above. Pertinent History Reviewed:  Reviewed past medical,surgical, social and family history.  Reviewed problem list, medications and allergies. Physical Assessment:   Vitals:   07/03/23 0939  BP: 134/80  Pulse: 68  Weight: 173 lb 9.6 oz (78.7 kg)  Height: 5\' 1"  (1.549 m)  Body mass index is 32.8 kg/m.        Physical Examination:   General appearance - well appearing, and in no distress  Mental status - alert, oriented to person, place, and time  Psych:  She has a normal mood and affect  Skin - warm and dry, normal color, no suspicious lesions noted  Chest - effort normal, all lung fields clear to  auscultation bilaterally  Heart - normal rate and regular rhythm  Neck:  midline trachea, no thyromegaly or nodules  Breasts - breasts appear normal, no suspicious masses, no skin or nipple changes or  axillary nodes  Abdomen - soft, nontender, nondistended, no masses or organomegaly. Candida in abdominal skin fold- painted w/ gentian violet  Pelvic - VULVA: normal appearing vulva with no masses, tenderness or lesions.  ~1.5cm fleshy soft skin tag Lt lower vulva, has been there for years  VAGINA: normal appearing vagina with normal color and discharge, no lesions  CERVIX: normal appearing cervix without discharge or lesions, no CMT  Thin prep pap is done w/ HR HPV cotesting  UTERUS: uterus is felt to be normal size, shape, consistency and nontender   ADNEXA: No adnexal masses or tenderness noted.  Extremities:  No swelling or varicosities noted  Chaperone:  Sherri Kaywood      No results found for this or any previous visit (from the past 24 hour(s)).  Assessment & Plan:  1) Well-Woman Exam  2) Skin candida> painted w/ gentian violet  3) Urge incontinence> improving  4) Vulvar skin tag> for years, no changes, would like it gone but worried about it healing w/ her DM. If decides for removal make appt w/ MD  Labs/procedures today: pap  Mammogram:  in April , or sooner if problems Colonoscopy: per GI, or sooner if problems  No orders of the defined types were placed in this encounter.   Meds: No orders of the defined types were placed in this encounter.   Follow-up: Return in about 1 year (around 07/02/2024) for Physical.  Cheral Marker CNM, WHNP-BC 07/03/2023 10:05 AM

## 2023-07-03 NOTE — Addendum Note (Signed)
Addended by: Caralyn Guile on: 07/03/2023 10:17 AM   Modules accepted: Orders

## 2023-07-05 LAB — CYTOLOGY - PAP
Comment: NEGATIVE
Diagnosis: NEGATIVE
High risk HPV: NEGATIVE

## 2023-07-06 ENCOUNTER — Other Ambulatory Visit (HOSPITAL_COMMUNITY): Payer: Self-pay

## 2023-07-20 ENCOUNTER — Other Ambulatory Visit: Payer: Self-pay

## 2023-07-20 ENCOUNTER — Other Ambulatory Visit (HOSPITAL_COMMUNITY): Payer: Self-pay

## 2023-08-03 ENCOUNTER — Other Ambulatory Visit: Payer: Self-pay

## 2023-08-06 ENCOUNTER — Other Ambulatory Visit (HOSPITAL_COMMUNITY): Payer: Self-pay

## 2023-08-24 ENCOUNTER — Other Ambulatory Visit (HOSPITAL_COMMUNITY)
Admission: RE | Admit: 2023-08-24 | Discharge: 2023-08-24 | Disposition: A | Discharge status: Home / Self Care | Payer: Commercial Managed Care - PPO | Source: Ambulatory Visit | Attending: Family Medicine | Admitting: Family Medicine

## 2023-08-24 DIAGNOSIS — E782 Mixed hyperlipidemia: Secondary | ICD-10-CM | POA: Diagnosis not present

## 2023-08-24 LAB — COMPREHENSIVE METABOLIC PANEL
ALT: 21 U/L (ref 0–44)
AST: 20 U/L (ref 15–41)
Albumin: 4.1 g/dL (ref 3.5–5.0)
Alkaline Phosphatase: 76 U/L (ref 38–126)
Anion gap: 9 (ref 5–15)
BUN: 17 mg/dL (ref 8–23)
CO2: 25 mmol/L (ref 22–32)
Calcium: 8.5 mg/dL — ABNORMAL LOW (ref 8.9–10.3)
Chloride: 99 mmol/L (ref 98–111)
Creatinine, Ser: 0.72 mg/dL (ref 0.44–1.00)
GFR, Estimated: >60 mL/min (ref >60)
Glucose, Bld: 149 mg/dL — ABNORMAL HIGH (ref 70–99)
Potassium: 4 mmol/L (ref 3.5–5.1)
Sodium: 133 mmol/L — ABNORMAL LOW (ref 135–145)
Total Bilirubin: 0.7 mg/dL (ref 0.3–1.2)
Total Protein: 7.2 g/dL (ref 6.5–8.1)

## 2023-08-24 LAB — HEMOGLOBIN A1C
Hgb A1c MFr Bld: 7.2 % — ABNORMAL HIGH (ref 4.8–5.6)
Mean Plasma Glucose: 159.94 mg/dL

## 2023-08-24 LAB — LIPID PANEL
Cholesterol: 216 mg/dL — ABNORMAL HIGH (ref 0–200)
HDL: 62 mg/dL (ref >40)
LDL Cholesterol: 125 mg/dL — ABNORMAL HIGH (ref 0–99)
Total CHOL/HDL Ratio: 3.5 {ratio}
Triglycerides: 145 mg/dL (ref <150)
VLDL: 29 mg/dL (ref 0–40)

## 2023-08-25 LAB — MICROALBUMIN / CREATININE URINE RATIO
Creatinine, Urine: 28.9 mg/dL
Microalb Creat Ratio: <10 mg/g{creat} (ref 0–29)
Microalb, Ur: <3 ug/mL — ABNORMAL HIGH

## 2023-08-26 ENCOUNTER — Other Ambulatory Visit: Payer: Self-pay | Admitting: Family Medicine

## 2023-08-26 ENCOUNTER — Other Ambulatory Visit: Payer: Self-pay

## 2023-08-28 ENCOUNTER — Other Ambulatory Visit (HOSPITAL_COMMUNITY): Payer: Self-pay

## 2023-08-28 MED ORDER — LOSARTAN POTASSIUM 100 MG PO TABS
100.0000 mg | ORAL_TABLET | Freq: Every day | ORAL | 3 refills | Status: DC
  Filled 2023-08-28: qty 90, 90d supply, fill #0
  Filled 2023-11-22: qty 90, 90d supply, fill #1

## 2023-08-29 ENCOUNTER — Other Ambulatory Visit: Payer: Self-pay

## 2023-08-29 ENCOUNTER — Ambulatory Visit (INDEPENDENT_AMBULATORY_CARE_PROVIDER_SITE_OTHER): Payer: Commercial Managed Care - PPO | Admitting: Family Medicine

## 2023-08-29 VITALS — BP 123/72 | HR 72 | Ht 62.0 in | Wt 177.0 lb

## 2023-08-29 DIAGNOSIS — B369 Superficial mycosis, unspecified: Secondary | ICD-10-CM | POA: Diagnosis not present

## 2023-08-29 DIAGNOSIS — E782 Mixed hyperlipidemia: Secondary | ICD-10-CM

## 2023-08-29 DIAGNOSIS — Z634 Disappearance and death of family member: Secondary | ICD-10-CM

## 2023-08-29 DIAGNOSIS — F4321 Adjustment disorder with depressed mood: Secondary | ICD-10-CM | POA: Diagnosis not present

## 2023-08-29 DIAGNOSIS — E1159 Type 2 diabetes mellitus with other circulatory complications: Secondary | ICD-10-CM

## 2023-08-29 DIAGNOSIS — I10 Essential (primary) hypertension: Secondary | ICD-10-CM

## 2023-08-29 DIAGNOSIS — F5104 Psychophysiologic insomnia: Secondary | ICD-10-CM

## 2023-08-29 DIAGNOSIS — E669 Obesity, unspecified: Secondary | ICD-10-CM

## 2023-08-29 MED ORDER — TIRZEPATIDE 10 MG/0.5ML ~~LOC~~ SOAJ
10.0000 mg | SUBCUTANEOUS | 1 refills | Status: DC
  Filled 2023-08-29: qty 6, 84d supply, fill #0
  Filled 2023-09-07 – 2023-09-30 (×3): qty 2, 28d supply, fill #0
  Filled 2023-10-25: qty 2, 28d supply, fill #1
  Filled 2023-11-22: qty 2, 28d supply, fill #2

## 2023-08-29 MED ORDER — CLOTRIMAZOLE-BETAMETHASONE 1-0.05 % EX CREA
1.0000 | TOPICAL_CREAM | Freq: Every day | CUTANEOUS | 1 refills | Status: DC
  Filled 2023-08-29: qty 45, 45d supply, fill #0
  Filled 2023-09-30: qty 45, 45d supply, fill #1

## 2023-08-29 MED ORDER — NYSTATIN 100000 UNIT/GM EX CREA
1.0000 | TOPICAL_CREAM | Freq: Two times a day (BID) | CUTANEOUS | 2 refills | Status: DC
  Filled 2023-08-29: qty 30, 15d supply, fill #0
  Filled 2023-09-30: qty 30, 15d supply, fill #1
  Filled 2023-10-13: qty 30, 15d supply, fill #2

## 2023-08-29 NOTE — Patient Instructions (Addendum)
F./U in 13 weeks , call if you need me sooner  Higher dose of mounjaro is prescribed , start as soon as possible, let me know if there are problems  RSV vaccine at your pharmacy  Start tums, take one twice daily for bones  Medication is prescribed for rash under abdomen  Normal foot exam   Keep an eye on difficulty with thinking, speech and word choice, you may need a brain scan  Therapy has a part  to play and may help you  Fasting lipid, cmp and  eGFr, HBA1c  3 to 5 days before next appt  Thanks for choosing [facility], we consider it a privelige to serve you.

## 2023-08-30 ENCOUNTER — Other Ambulatory Visit (HOSPITAL_COMMUNITY): Payer: Self-pay

## 2023-09-08 ENCOUNTER — Other Ambulatory Visit: Payer: Self-pay

## 2023-09-08 ENCOUNTER — Other Ambulatory Visit (HOSPITAL_COMMUNITY): Payer: Self-pay

## 2023-09-11 ENCOUNTER — Other Ambulatory Visit: Payer: Self-pay

## 2023-09-12 DIAGNOSIS — X32XXXA Exposure to sunlight, initial encounter: Secondary | ICD-10-CM | POA: Diagnosis not present

## 2023-09-12 DIAGNOSIS — L570 Actinic keratosis: Secondary | ICD-10-CM | POA: Diagnosis not present

## 2023-09-12 DIAGNOSIS — Z1283 Encounter for screening for malignant neoplasm of skin: Secondary | ICD-10-CM | POA: Diagnosis not present

## 2023-09-12 DIAGNOSIS — B078 Other viral warts: Secondary | ICD-10-CM | POA: Diagnosis not present

## 2023-09-12 DIAGNOSIS — D225 Melanocytic nevi of trunk: Secondary | ICD-10-CM | POA: Diagnosis not present

## 2023-09-15 DIAGNOSIS — B369 Superficial mycosis, unspecified: Secondary | ICD-10-CM | POA: Insufficient documentation

## 2023-09-15 NOTE — Assessment & Plan Note (Signed)
Alicia Neal is reminded of the importance of commitment to daily physical activity for 30 minutes or more, as able and the need to limit carbohydrate intake to 30 to 60 grams per meal to help with blood sugar control.   The need to take medication as prescribed, test blood sugar as directed, and to call between visits if there is a concern that blood sugar is uncontrolled is also discussed.   Alicia Neal is reminded of the importance of daily foot exam, annual eye examination, and good blood sugar, blood pressure and cholesterol control.     Latest Ref Rng & Units 08/24/2023    9:56 AM 08/24/2023    9:55 AM 04/07/2023   11:17 AM 04/07/2023   11:16 AM 03/14/2023    4:02 PM  Diabetic Labs  HbA1c 4.8 - 5.6 % 7.2   6.7     Microalbumin Not Estab. ug/mL  <3.0      Micro/Creat Ratio 0 - 29 mg/g creat  <10      Chol 0 - 200 mg/dL  161   096    HDL >04 mg/dL  62   53    Calc LDL 0 - 99 mg/dL  540   88    Triglycerides <150 mg/dL  981   191    Creatinine 0.44 - 1.00 mg/dL 4.78     2.95       62/13/0865    4:05 PM 07/03/2023    9:39 AM 06/15/2023    8:44 AM 03/31/2023    8:19 AM 03/23/2023   10:50 AM 03/23/2023   10:30 AM 03/23/2023   10:15 AM  BP/Weight  Systolic BP 123 134 113  106 96 88  Diastolic BP 72 80 71  61 49 57  Wt. (Lbs) 177.04 173.6 174.12 174     BMI 32.38 kg/m2 32.8 kg/m2 31.85 kg/m2 31.83 kg/m2         Latest Ref Rng & Units 03/08/2023   12:00 AM 08/03/2022    8:20 AM  Foot/eye exam completion dates  Eye Exam No Retinopathy No Retinopathy       Foot Form Completion   Done     This result is from an external source.      Not at goal, dose increase in [place] and more diligence with food choice

## 2023-09-15 NOTE — Progress Notes (Signed)
Alicia Neal     MRN: [id number]      DOB: 1961/06/01  Chief Complaint  Patient presents with   Follow-up    Follow up    HPI Alicia Neal is here for follow up and re-evaluation of chronic medical conditions, medication management and review of any available recent lab and radiology data.     The PT denies any adverse reactions to current medications since the last visit.  Working through the unexpected sudden death of one of her 2 sons in an accident, stillvery new , so the path is still rough, states she feels as though she has " brain freeze" had to make rapid / on spot whether to take her son off of life support to undergo surgery that woud leave him in a vegetative stae, has difficulty expressing herself, finding appropriate words, word choice and ability to think is also slowed . Will watch this, I recommend possible need for neurology eval as had near syncopal event earlier this year also, she elects to  monitor some more at this time. I am recommending professional individual counselling again, at this time does not particularly want to do this, has a lot of support from family and friends which she deems is sufficient currently Reports poor sleep, ruminates a lot at night Exercise has been less in recent months due to fracture ankle , now heaked and able to resume regular daily walk in past monthto 6 weeks. Food choice jas been less controled wit passing os son, A LOT of eating out and food being delivered ROS Denies recent fever or chills. Denies sinus pressure, nasal congestion, ear pain or sore throat. Denies chest congestion, productive cough or wheezing. Denies chest pains, palpitations and leg swelling Denies abdominal pain, nausea, vomiting,diarrhea or constipation.   Denies dysuria, frequency, hesitancy or incontinence. Denies joint pain, swelling and limitation in mobility. Denies headaches, seizures, numbness, or tingling. C/o rash on lower abdomen x 2  weeks  PE  BP 123/72 (BP Location: Right Arm, Patient Position: Sitting, Cuff Size: Large)   Pulse 72   Ht 5\' 2"  (1.575 m)   Wt 177 lb 0.6 oz (80.3 kg)   SpO2 96%   BMI 32.38 kg/m   Patient alert and oriented and in no cardiopulmonary distress.  HEENT: No facial asymmetry, EOMI,     Neck supple .  Chest: Clear to auscultation bilaterally.  CVS: S1, S2 no murmurs, no S3.Regular rate.  ABD: Soft non tender.   Ext: No edema  MS: Adequate ROM spine, shoulders, hips and knees.  S Psych: Good eye contact, normal affect.tearful at times while  speaking of her son and coping with the loss, Memory intact not anxious or depressed appearing.  CNS: CN 2-12 intact, power,  normal throughout.no focal deficits noted.   Assessment & Plan HTN, goal below 130/80 DASH diet and commitment to daily physical activity for a minimum of 30 minutes discussed and encouraged, as a part of hypertension management. The importance of attaining a healthy weight is also discussed.     08/29/2023    4:05 PM 07/03/2023    9:39 AM 06/15/2023    8:44 AM 03/31/2023    8:19 AM 03/23/2023   10:50 AM 03/23/2023   10:30 AM 03/23/2023   10:15 AM  BP/Weight  Systolic BP 123 134 113  106 96 88  Diastolic BP 72 80 71  61 49 57  Wt. (Lbs) 177.04 173.6 174.12 174     BMI  32.38 kg/m2 32.8 kg/m2 31.85 kg/m2 31.83 kg/m2        Controlled, no change in medication   Mixed hyperlipidemia Hyperlipidemia:Low fat diet discussed and encouraged.   Lipid Panel  Lab Results  Component Value Date   CHOL 216 (H) 08/24/2023   HDL 62 08/24/2023   LDLCALC 125 (H) 08/24/2023   TRIG 145 08/24/2023   CHOLHDL 3.5 08/24/2023     Elevated and currnetly uncontrolled no change in medication , due to recent circumstances , will become more diligent with foods  Insomnia Currently experiencing difficulty with sleeping,has used no xanax and does not want to, will continue to work though this, willing to use melatonin or oTC  product if needed Sleep hygiene is reviewed and is good  Mildly obese  Patient re-educated about  the importance of commitment to a  minimum of 150 minutes of exercise per week as able.  The importance of healthy food choices with portion control discussed, as well as eating regularly and within a 12 hour window most days. The need to choose "clean , green" food 50 to 75% of the time is discussed, as well as to make water the primary drink and set a goal of 64 ounces water daily.       08/29/2023    4:05 PM 07/03/2023    9:39 AM 06/15/2023    8:44 AM  Weight /BMI  Weight 177 lb 0.6 oz 173 lb 9.6 oz 174 lb 1.9 oz  Height 5\' 2"  (1.575 m) 5\' 1"  (1.549 m) 5\' 2"  (1.575 m)  BMI 32.38 kg/m2 32.8 kg/m2 31.85 kg/m2    Sight weight gainwill work to reverse  Type 2 diabetes mellitus with vascular disease (HCC) Alicia Neal is reminded of the importance of commitment to daily physical activity for 30 minutes or more, as able and the need to limit carbohydrate intake to 30 to 60 grams per meal to help with blood sugar control.   The need to take medication as prescribed, test blood sugar as directed, and to call between visits if there is a concern that blood sugar is uncontrolled is also discussed.   Alicia Neal is reminded of the importance of daily foot exam, annual eye examination, and good blood sugar, blood pressure and cholesterol control.     Latest Ref Rng & Units 08/24/2023    9:56 AM 08/24/2023    9:55 AM 04/07/2023   11:17 AM 04/07/2023   11:16 AM 03/14/2023    4:02 PM  Diabetic Labs  HbA1c 4.8 - 5.6 % 7.2   6.7     Microalbumin Not Estab. ug/mL  <3.0      Micro/Creat Ratio 0 - 29 mg/g creat  <10      Chol 0 - 200 mg/dL  846   962    HDL >95 mg/dL  62   53    Calc LDL 0 - 99 mg/dL  284   88    Triglycerides <150 mg/dL  132   440    Creatinine 0.44 - 1.00 mg/dL 1.02     7.25       36/64/4034    4:05 PM 07/03/2023    9:39 AM 06/15/2023    8:44 AM 03/31/2023    8:19 AM 03/23/2023    10:50 AM 03/23/2023   10:30 AM 03/23/2023   10:15 AM  BP/Weight  Systolic BP 123 134 113  106 96 88  Diastolic BP 72 80 71  61 49 57  Wt. (  Lbs) 177.04 173.6 174.12 174     BMI 32.38 kg/m2 32.8 kg/m2 31.85 kg/m2 31.83 kg/m2         Latest Ref Rng & Units 03/08/2023   12:00 AM 08/03/2022    8:20 AM  Foot/eye exam completion dates  Eye Exam No Retinopathy No Retinopathy       Foot Form Completion   Done     This result is from an external source.      Not at goal, dose increase in [place] and more diligence with food choice  Grief at loss of child Grief continues to be very real, still processing the event, experiencing "brain freeze" Her daughter in law and grandson are with her and are supporting each other , therapy recommended, support group discussed also  Dermatomycosis Current rash on abdomen by report , nystatin powder and clotrimazole/betamethasone cream are prescribedfor as needed use after 1 week of treatment

## 2023-09-15 NOTE — Assessment & Plan Note (Signed)
Hyperlipidemia:Low fat diet discussed and encouraged.   Lipid Panel  Lab Results  Component Value Date   CHOL 216 (H) 08/24/2023   HDL 62 08/24/2023   LDLCALC 125 (H) 08/24/2023   TRIG 145 08/24/2023   CHOLHDL 3.5 08/24/2023     Elevated and currnetly uncontrolled no change in medication , due to recent circumstances , will become more diligent with foods

## 2023-09-15 NOTE — Assessment & Plan Note (Signed)
DASH diet and commitment to daily physical activity for a minimum of 30 minutes discussed and encouraged, as a part of hypertension management. The importance of attaining a healthy weight is also discussed.     08/29/2023    4:05 PM 07/03/2023    9:39 AM 06/15/2023    8:44 AM 03/31/2023    8:19 AM 03/23/2023   10:50 AM 03/23/2023   10:30 AM 03/23/2023   10:15 AM  BP/Weight  Systolic BP 123 134 113  106 96 88  Diastolic BP 72 80 71  61 49 57  Wt. (Lbs) 177.04 173.6 174.12 174     BMI 32.38 kg/m2 32.8 kg/m2 31.85 kg/m2 31.83 kg/m2        Controlled, no change in medication

## 2023-09-15 NOTE — Assessment & Plan Note (Signed)
Current rash on abdomen by report , nystatin powder and clotrimazole/betamethasone cream are prescribedfor as needed use after 1 week of treatment

## 2023-09-15 NOTE — Assessment & Plan Note (Signed)
Grief continues to be very real, still processing the event, experiencing "brain freeze" Her daughter in law and grandson are with her and are supporting each other , therapy recommended, support group discussed also

## 2023-09-15 NOTE — Assessment & Plan Note (Signed)
Currently experiencing difficulty with sleeping,has used no xanax and does not want to, will continue to work though this, willing to use melatonin or oTC product if needed Sleep hygiene is reviewed and is good

## 2023-09-15 NOTE — Assessment & Plan Note (Signed)
  Patient re-educated about  the importance of commitment to a  minimum of 150 minutes of exercise per week as able.  The importance of healthy food choices with portion control discussed, as well as eating regularly and within a 12 hour window most days. The need to choose "clean , green" food 50 to 75% of the time is discussed, as well as to make water the primary drink and set a goal of 64 ounces water daily.       08/29/2023    4:05 PM 07/03/2023    9:39 AM 06/15/2023    8:44 AM  Weight /BMI  Weight 177 lb 0.6 oz 173 lb 9.6 oz 174 lb 1.9 oz  Height 5\' 2"  (1.575 m) 5\' 1"  (1.549 m) 5\' 2"  (1.575 m)  BMI 32.38 kg/m2 32.8 kg/m2 31.85 kg/m2    Sight weight gainwill work to reverse

## 2023-09-20 ENCOUNTER — Other Ambulatory Visit (HOSPITAL_COMMUNITY): Payer: Self-pay

## 2023-09-20 DIAGNOSIS — B078 Other viral warts: Secondary | ICD-10-CM | POA: Diagnosis not present

## 2023-09-30 ENCOUNTER — Other Ambulatory Visit (HOSPITAL_COMMUNITY): Payer: Self-pay

## 2023-10-01 ENCOUNTER — Other Ambulatory Visit: Payer: Self-pay

## 2023-10-02 ENCOUNTER — Other Ambulatory Visit: Payer: Self-pay

## 2023-10-04 ENCOUNTER — Other Ambulatory Visit (HOSPITAL_COMMUNITY): Payer: Self-pay

## 2023-10-07 ENCOUNTER — Other Ambulatory Visit: Payer: Self-pay | Admitting: Family Medicine

## 2023-10-13 ENCOUNTER — Other Ambulatory Visit (HOSPITAL_COMMUNITY): Payer: Self-pay

## 2023-10-25 ENCOUNTER — Other Ambulatory Visit: Payer: Self-pay

## 2023-10-27 ENCOUNTER — Other Ambulatory Visit: Payer: Self-pay

## 2023-11-22 ENCOUNTER — Other Ambulatory Visit: Payer: Self-pay

## 2023-11-28 ENCOUNTER — Ambulatory Visit: Payer: Commercial Managed Care - PPO | Admitting: Family Medicine

## 2023-11-28 VITALS — BP 128/85 | HR 69 | Ht 62.0 in | Wt 174.1 lb

## 2023-11-28 DIAGNOSIS — E669 Obesity, unspecified: Secondary | ICD-10-CM

## 2023-11-28 DIAGNOSIS — Z1231 Encounter for screening mammogram for malignant neoplasm of breast: Secondary | ICD-10-CM

## 2023-11-28 DIAGNOSIS — E1169 Type 2 diabetes mellitus with other specified complication: Secondary | ICD-10-CM

## 2023-11-28 DIAGNOSIS — E559 Vitamin D deficiency, unspecified: Secondary | ICD-10-CM

## 2023-11-28 DIAGNOSIS — I10 Essential (primary) hypertension: Secondary | ICD-10-CM | POA: Diagnosis not present

## 2023-11-28 DIAGNOSIS — E782 Mixed hyperlipidemia: Secondary | ICD-10-CM

## 2023-11-28 DIAGNOSIS — E785 Hyperlipidemia, unspecified: Secondary | ICD-10-CM

## 2023-11-28 DIAGNOSIS — F5104 Psychophysiologic insomnia: Secondary | ICD-10-CM | POA: Diagnosis not present

## 2023-11-28 DIAGNOSIS — E1159 Type 2 diabetes mellitus with other circulatory complications: Secondary | ICD-10-CM

## 2023-11-28 NOTE — Patient Instructions (Addendum)
Welcome to medicare in 3.5 months, call if you need m [place]  90 day supplies to current  pharmacy will be sent  HBA1C, lipid, cmp and eFGr cBC, tSH and vit D this month fasting   It is important that you exercise regularly at least 30 minutes 5 times a week. If you develop chest pain, have severe difficulty breathing, or feel very tired, stop exercising immediately and seek medical attention   Think about what you will eat, plan ahead. Choose " clean, green, fresh or frozen" over canned, processed or packaged foods which are more sugary, salty and fatty. 70 to 75% of food eaten should be vegetables and fruit. Three meals at set times with snacks allowed between meals, but they must be fruit or vegetables. Aim to eat over a 12 hour period , example 7 am to 7 pm, and STOP after  your last meal of the day. Drink water,generally about 64 ounces per day, no other drink is as healthy. Fruit juice is best enjoyed in a healthy way, by EATING the fruit.  Congrats   Mammogram due to be scheduled you will be contacted with date via My chart, office will arrange, [facility]   Thanks for choosing [facility], we consider it a privelige to serve you.

## 2023-12-04 ENCOUNTER — Ambulatory Visit (HOSPITAL_COMMUNITY): Payer: Commercial Managed Care - PPO

## 2023-12-05 ENCOUNTER — Telehealth: Payer: Self-pay

## 2023-12-05 NOTE — Telephone Encounter (Signed)
Copied from CRM [id number]. Topic: Clinical - Prescription Issue >> Dec 05, 2023 11:55 AM Maxwell Marion wrote: Reason for CRM: Patient called because she had a visit on the 21st with Dr. Lodema Hong and prednisone was supposed to be sent to the pharmacy for her shoulder as well as a 90 day supply for all other current meds but she said she has not received anything. I did not see where any medications had been ordered

## 2023-12-06 ENCOUNTER — Other Ambulatory Visit: Payer: Self-pay

## 2023-12-06 ENCOUNTER — Other Ambulatory Visit (HOSPITAL_COMMUNITY): Payer: Self-pay

## 2023-12-06 ENCOUNTER — Ambulatory Visit (HOSPITAL_COMMUNITY)
Admission: RE | Admit: 2023-12-06 | Discharge: 2023-12-06 | Disposition: A | Discharge status: Home / Self Care | Payer: Commercial Managed Care - PPO | Source: Ambulatory Visit | Attending: Acute Care | Admitting: Acute Care

## 2023-12-06 DIAGNOSIS — Z122 Encounter for screening for malignant neoplasm of respiratory organs: Secondary | ICD-10-CM | POA: Diagnosis not present

## 2023-12-06 DIAGNOSIS — Z87891 Personal history of nicotine dependence: Secondary | ICD-10-CM | POA: Insufficient documentation

## 2023-12-06 DIAGNOSIS — E782 Mixed hyperlipidemia: Secondary | ICD-10-CM | POA: Diagnosis not present

## 2023-12-06 DIAGNOSIS — E1159 Type 2 diabetes mellitus with other circulatory complications: Secondary | ICD-10-CM | POA: Diagnosis not present

## 2023-12-06 DIAGNOSIS — E559 Vitamin D deficiency, unspecified: Secondary | ICD-10-CM | POA: Diagnosis not present

## 2023-12-06 DIAGNOSIS — I10 Essential (primary) hypertension: Secondary | ICD-10-CM | POA: Diagnosis not present

## 2023-12-06 MED ORDER — LORATADINE 10 MG PO TABS
10.0000 mg | ORAL_TABLET | Freq: Every morning | ORAL | 3 refills | Status: AC
Start: 2023-12-06 — End: ?
  Filled 2023-12-06: qty 90, 90d supply, fill #0

## 2023-12-06 MED ORDER — PANTOPRAZOLE SODIUM 40 MG PO TBEC
40.0000 mg | DELAYED_RELEASE_TABLET | Freq: Every day | ORAL | 3 refills | Status: DC
  Filled 2023-12-06: qty 90, 90d supply, fill #0

## 2023-12-06 MED ORDER — AMLODIPINE BESYLATE 10 MG PO TABS
10.0000 mg | ORAL_TABLET | Freq: Every evening | ORAL | 3 refills | Status: AC
Start: 2023-12-06 — End: ?
  Filled 2023-12-06: qty 90, 90d supply, fill #0

## 2023-12-06 MED ORDER — LOSARTAN POTASSIUM 100 MG PO TABS
100.0000 mg | ORAL_TABLET | Freq: Every day | ORAL | 3 refills | Status: DC
  Filled 2023-12-06: qty 90, 90d supply, fill #0

## 2023-12-06 MED ORDER — CLOTRIMAZOLE-BETAMETHASONE 1-0.05 % EX CREA
1.0000 | TOPICAL_CREAM | Freq: Every day | CUTANEOUS | 1 refills | Status: AC
  Filled 2023-12-06: qty 45, 30d supply, fill #0

## 2023-12-06 MED ORDER — CITALOPRAM HYDROBROMIDE 20 MG PO TABS
20.0000 mg | ORAL_TABLET | Freq: Every day | ORAL | 3 refills | Status: DC
  Filled 2023-12-06: qty 90, 90d supply, fill #0

## 2023-12-07 ENCOUNTER — Encounter: Payer: Self-pay | Admitting: Family Medicine

## 2023-12-07 LAB — CBC
Hematocrit: 40.2 % (ref 34.0–46.6)
Hemoglobin: 13.3 g/dL (ref 11.1–15.9)
MCH: 30.2 pg (ref 26.6–33.0)
MCHC: 33.1 g/dL (ref 31.5–35.7)
MCV: 91 fL (ref 79–97)
Platelets: 329 10*3/uL (ref 150–450)
RBC: 4.41 x10E6/uL (ref 3.77–5.28)
RDW: 12.3 % (ref 11.7–15.4)
WBC: 8.9 10*3/uL (ref 3.4–10.8)

## 2023-12-07 LAB — LIPID PANEL
Chol/HDL Ratio: 2.9 {ratio} (ref 0.0–4.4)
Cholesterol, Total: 158 mg/dL (ref 100–199)
HDL: 55 mg/dL (ref >39)
LDL Chol Calc (NIH): 71 mg/dL (ref 0–99)
Triglycerides: 194 mg/dL — ABNORMAL HIGH (ref 0–149)
VLDL Cholesterol Cal: 32 mg/dL (ref 5–40)

## 2023-12-07 LAB — CMP14+EGFR
ALT: 19 [IU]/L (ref 0–32)
AST: 22 [IU]/L (ref 0–40)
Albumin: 4.5 g/dL (ref 3.9–4.9)
Alkaline Phosphatase: 99 [IU]/L (ref 44–121)
BUN/Creatinine Ratio: 19 (ref 12–28)
BUN: 15 mg/dL (ref 8–27)
Bilirubin Total: 0.4 mg/dL (ref 0.0–1.2)
CO2: 20 mmol/L (ref 20–29)
Calcium: 8.8 mg/dL (ref 8.7–10.3)
Chloride: 98 mmol/L (ref 96–106)
Creatinine, Ser: 0.8 mg/dL (ref 0.57–1.00)
Globulin, Total: 2.4 g/dL (ref 1.5–4.5)
Glucose: 159 mg/dL — ABNORMAL HIGH (ref 70–99)
Potassium: 4.4 mmol/L (ref 3.5–5.2)
Sodium: 134 mmol/L (ref 134–144)
Total Protein: 6.9 g/dL (ref 6.0–8.5)
eGFR: 82 mL/min/{1.73_m2} (ref >59)

## 2023-12-07 LAB — VITAMIN D 25 HYDROXY (VIT D DEFICIENCY, FRACTURES): Vit D, 25-Hydroxy: 38.7 ng/mL (ref 30.0–100.0)

## 2023-12-07 LAB — HEMOGLOBIN A1C
Est. average glucose Bld gHb Est-mCnc: 166 mg/dL
Hgb A1c MFr Bld: 7.4 % — ABNORMAL HIGH (ref 4.8–5.6)

## 2023-12-07 LAB — TSH: TSH: 2.03 u[IU]/mL (ref 0.450–4.500)

## 2023-12-07 MED ORDER — TEMAZEPAM 15 MG PO CAPS
15.0000 mg | ORAL_CAPSULE | Freq: Every evening | ORAL | 0 refills | Status: DC | PRN
  Filled 2023-12-07: qty 90, 90d supply, fill #0

## 2023-12-07 MED ORDER — PREDNISONE 10 MG PO TABS
10.0000 mg | ORAL_TABLET | Freq: Two times a day (BID) | ORAL | 0 refills | Status: DC
  Filled 2023-12-07: qty 10, 5d supply, fill #0

## 2023-12-07 MED ORDER — REPATHA SURECLICK 140 MG/ML ~~LOC~~ SOAJ
140.0000 mg | SUBCUTANEOUS | 1 refills | Status: DC
  Filled 2023-12-07: qty 6, 84d supply, fill #0

## 2023-12-07 MED ORDER — TIRZEPATIDE 12.5 MG/0.5ML ~~LOC~~ SOAJ
12.5000 mg | SUBCUTANEOUS | 1 refills | Status: DC
  Filled 2023-12-07: qty 2, 28d supply, fill #0

## 2023-12-08 ENCOUNTER — Other Ambulatory Visit: Payer: Self-pay

## 2023-12-08 NOTE — Assessment & Plan Note (Signed)
Controlled, no change in medication Uses medication 'as needed"

## 2023-12-08 NOTE — Assessment & Plan Note (Signed)
  Patient re-educated about  the importance of commitment to a  minimum of 150 minutes of exercise per week as able.  The importance of healthy food choices with portion control discussed, as well as eating regularly and within a 12 hour window most days. The need to choose "clean , green" food 50 to 75% of the time is discussed, as well as to make water the primary drink and set a goal of 64 ounces water daily.       11/28/2023    4:06 PM 08/29/2023    4:05 PM 07/03/2023    9:39 AM  Weight /BMI  Weight 174 lb 1.3 oz 177 lb 0.6 oz 173 lb 9.6 oz  Height 5\' 2"  (1.575 m) 5\' 2"  (1.575 m) 5\' 1"  (1.549 m)  BMI 31.84 kg/m2 32.38 kg/m2 32.8 kg/m2

## 2023-12-08 NOTE — Assessment & Plan Note (Signed)
Diabetes associated with hypertension and hyperlipidemia  Alicia Neal is reminded of the importance of commitment to daily physical activity for 30 minutes or more, as able and the need to limit carbohydrate intake to 30 to 60 grams per meal to help with blood sugar control.   The need to take medication as prescribed, test blood sugar as directed, and to call between visits if there is a concern that blood sugar is uncontrolled is also discussed.   Alicia Neal is reminded of the importance of daily foot exam, annual eye examination, and good blood sugar, blood pressure and cholesterol control.     Latest Ref Rng & Units 12/06/2023    8:11 AM 08/24/2023    9:56 AM 08/24/2023    9:55 AM 04/07/2023   11:17 AM 04/07/2023   11:16 AM  Diabetic Labs  HbA1c 4.8 - 5.6 % 7.4  7.2   6.7    Microalbumin Not Estab. ug/mL   <3.0     Micro/Creat Ratio 0 - 29 mg/g creat   <10     Chol 100 - 199 mg/dL 657   846   962   HDL >95 mg/dL 55   62   53   Calc LDL 0 - 99 mg/dL 71   284   88   Triglycerides 0 - 149 mg/dL 132   440   102   Creatinine 0.57 - 1.00 mg/dL 7.25  3.66          4/40/3474    4:06 PM 08/29/2023    4:05 PM 07/03/2023    9:39 AM 06/15/2023    8:44 AM 03/31/2023    8:19 AM 03/23/2023   10:50 AM 03/23/2023   10:30 AM  BP/Weight  Systolic BP 128 123 134 113  106 96  Diastolic BP 85 72 80 71  61 49  Wt. (Lbs) 174.08 177.04 173.6 174.12 174    BMI 31.84 kg/m2 32.38 kg/m2 32.8 kg/m2 31.85 kg/m2 31.83 kg/m2        Latest Ref Rng & Units 03/08/2023   12:00 AM 08/03/2022    8:20 AM  Foot/eye exam completion dates  Eye Exam No Retinopathy No Retinopathy       Foot Form Completion   Done     This result is from an external source.      Deteriorated inc mounjaro dose, re eval in 3 months

## 2023-12-08 NOTE — Progress Notes (Signed)
BASYA CASAVANT     MRN: [id number]      DOB: Aug 30, 1961  Chief Complaint  Patient presents with   Follow-up    Follow up    HPI Ms. Fine is here for follow up and re-evaluation of chronic medical conditions, medication management and review of any available recent lab and radiology data.  Preventive health is updated, specifically  Cancer screening and Immunization.   Questions or concerns regarding consultations or procedures which the PT has had in the interim are  addressed. The PT denies any adverse reactions to current medications since the last visit.  Food choice and exercise are still works in progress Mentally and emotionallly has good support is in a group  Still c/o rigth shoulder pain, has had Ortho eval, no interst in f/u at this time, no recent trauma to joint   ROS Denies recent fever or chills. Denies sinus pressure, nasal congestion, ear pain or sore throat. Denies chest congestion, productive cough or wheezing. Denies chest pains, palpitations and leg swelling Denies abdominal pain, nausea, vomiting,diarrhea or constipation.   Denies dysuria, frequency, hesitancy or incontinence. Denies headaches, seizures, numbness, or tingling. Denies uncontrolled depression, anxiety going through grieving or uncontrolled insomnia.does respond to medication when needed Denies skin break down or rash.   PE  BP 128/85 (BP Location: Right Arm, Patient Position: Sitting, Cuff Size: Normal)   Pulse 69   Ht 5\' 2"  (1.575 m)   Wt 174 lb 1.3 oz (79 kg)   SpO2 94%   BMI 31.84 kg/m   Patient alert and oriented and in no cardiopulmonary distress.  HEENT: No facial asymmetry, EOMI,       Chest: Clear to auscultation bilaterally.decreased though adequate air entry  CVS: S1, S2 no murmurs, no S3.Regular rate.  ABD: Soft non tender.   Ext: No edema  MS: Adequate ROM spine, , hips and knees.Reduced in right shoulder  Skin: Intact, no ulcerations or rash noted.  Psych:  Good eye contact, normal affect. Memory intact not anxious or depressed appearing.  CNS: CN 2-12 intact, power,  normal throughout.no focal deficits noted.   Assessment & Plan  HTN, goal below 130/80 Controlled, no change in medication DASH diet and commitment to daily physical activity for a minimum of 30 minutes discussed and encouraged, as a part of hypertension management. The importance of attaining a healthy weight is also discussed.     11/28/2023    4:06 PM 08/29/2023    4:05 PM 07/03/2023    9:39 AM 06/15/2023    8:44 AM 03/31/2023    8:19 AM 03/23/2023   10:50 AM 03/23/2023   10:30 AM  BP/Weight  Systolic BP 128 123 134 113  106 96  Diastolic BP 85 72 80 71  61 49  Wt. (Lbs) 174.08 177.04 173.6 174.12 174    BMI 31.84 kg/m2 32.38 kg/m2 32.8 kg/m2 31.85 kg/m2 31.83 kg/m2         Type 2 diabetes mellitus with other specified complication (HCC) Diabetes associated with hypertension and hyperlipidemia  Ms. Lorincz is reminded of the importance of commitment to daily physical activity for 30 minutes or more, as able and the need to limit carbohydrate intake to 30 to 60 grams per meal to help with blood sugar control.   The need to take medication as prescribed, test blood sugar as directed, and to call between visits if there is a concern that blood sugar is uncontrolled is also discussed.   Ms. Pandolfi  is reminded of the importance of daily foot exam, annual eye examination, and good blood sugar, blood pressure and cholesterol control.     Latest Ref Rng & Units 12/06/2023    8:11 AM 08/24/2023    9:56 AM 08/24/2023    9:55 AM 04/07/2023   11:17 AM 04/07/2023   11:16 AM  Diabetic Labs  HbA1c 4.8 - 5.6 % 7.4  7.2   6.7    Microalbumin Not Estab. ug/mL   <3.0     Micro/Creat Ratio 0 - 29 mg/g creat   <10     Chol 100 - 199 mg/dL 161   096   045   HDL >40 mg/dL 55   62   53   Calc LDL 0 - 99 mg/dL 71   981   88   Triglycerides 0 - 149 mg/dL 191   478   295   Creatinine  0.57 - 1.00 mg/dL 6.21  3.08          6/57/8469    4:06 PM 08/29/2023    4:05 PM 07/03/2023    9:39 AM 06/15/2023    8:44 AM 03/31/2023    8:19 AM 03/23/2023   10:50 AM 03/23/2023   10:30 AM  BP/Weight  Systolic BP 128 123 134 113  106 96  Diastolic BP 85 72 80 71  61 49  Wt. (Lbs) 174.08 177.04 173.6 174.12 174    BMI 31.84 kg/m2 32.38 kg/m2 32.8 kg/m2 31.85 kg/m2 31.83 kg/m2        Latest Ref Rng & Units 03/08/2023   12:00 AM 08/03/2022    8:20 AM  Foot/eye exam completion dates  Eye Exam No Retinopathy No Retinopathy       Foot Form Completion   Done     This result is from an external source.      Deteriorated inc mounjaro dose, re eval in 3 months  Hyperlipidemia LDL goal <100 Hyperlipidemia:Low fat diet discussed and encouraged.   Lipid Panel  Lab Results  Component Value Date   CHOL 158 12/06/2023   HDL 55 12/06/2023   LDLCALC 71 12/06/2023   TRIG 194 (H) 12/06/2023   CHOLHDL 2.9 12/06/2023    Needs to reduce dietary fat   Mildly obese  Patient re-educated about  the importance of commitment to a  minimum of 150 minutes of exercise per week as able.  The importance of healthy food choices with portion control discussed, as well as eating regularly and within a 12 hour window most days. The need to choose "clean , green" food 50 to 75% of the time is discussed, as well as to make water the primary drink and set a goal of 64 ounces water daily.       11/28/2023    4:06 PM 08/29/2023    4:05 PM 07/03/2023    9:39 AM  Weight /BMI  Weight 174 lb 1.3 oz 177 lb 0.6 oz 173 lb 9.6 oz  Height 5\' 2"  (1.575 m) 5\' 2"  (1.575 m) 5\' 1"  (1.549 m)  BMI 31.84 kg/m2 32.38 kg/m2 32.8 kg/m2      Insomnia Controlled, no change in medication Uses medication 'as needed"

## 2023-12-08 NOTE — Telephone Encounter (Signed)
I spoke with the pt and this jhas been taken care of

## 2023-12-08 NOTE — Assessment & Plan Note (Signed)
Hyperlipidemia:Low fat diet discussed and encouraged.   Lipid Panel  Lab Results  Component Value Date   CHOL 158 12/06/2023   HDL 55 12/06/2023   LDLCALC 71 12/06/2023   TRIG 194 (H) 12/06/2023   CHOLHDL 2.9 12/06/2023    Needs to reduce dietary fat

## 2023-12-08 NOTE — Assessment & Plan Note (Signed)
Controlled, no change in medication DASH diet and commitment to daily physical activity for a minimum of 30 minutes discussed and encouraged, as a part of hypertension management. The importance of attaining a healthy weight is also discussed.     11/28/2023    4:06 PM 08/29/2023    4:05 PM 07/03/2023    9:39 AM 06/15/2023    8:44 AM 03/31/2023    8:19 AM 03/23/2023   10:50 AM 03/23/2023   10:30 AM  BP/Weight  Systolic BP 128 123 134 113  106 96  Diastolic BP 85 72 80 71  61 49  Wt. (Lbs) 174.08 177.04 173.6 174.12 174    BMI 31.84 kg/m2 32.38 kg/m2 32.8 kg/m2 31.85 kg/m2 31.83 kg/m2

## 2023-12-15 ENCOUNTER — Other Ambulatory Visit (HOSPITAL_COMMUNITY): Payer: Self-pay

## 2023-12-15 ENCOUNTER — Other Ambulatory Visit: Payer: Self-pay

## 2023-12-15 DIAGNOSIS — Z87891 Personal history of nicotine dependence: Secondary | ICD-10-CM

## 2023-12-15 DIAGNOSIS — Z122 Encounter for screening for malignant neoplasm of respiratory organs: Secondary | ICD-10-CM

## 2024-01-08 ENCOUNTER — Other Ambulatory Visit (HOSPITAL_COMMUNITY): Payer: Self-pay

## 2024-01-10 ENCOUNTER — Other Ambulatory Visit: Payer: Self-pay

## 2024-01-10 MED ORDER — TEMAZEPAM 15 MG PO CAPS: 15.0000 mg | ORAL_CAPSULE | Freq: Every evening | ORAL | 0 refills | Status: DC | PRN

## 2024-01-10 NOTE — Addendum Note (Signed)
 Addended by: Abner Greenspan on: 01/10/2024 03:41 PM   Modules accepted: Orders

## 2024-01-11 ENCOUNTER — Telehealth: Payer: Self-pay | Admitting: Pharmacy Technician

## 2024-01-11 ENCOUNTER — Other Ambulatory Visit (HOSPITAL_COMMUNITY): Payer: Self-pay

## 2024-01-11 NOTE — Telephone Encounter (Signed)
 Pharmacy Patient Advocate Encounter   Received notification from Patient Pharmacy that prior authorization for [facility] 12.5MG /0.5ML AUTO-INJECTORS is required/requested.   Insurance verification completed.   The patient is insured through [facility] .   Per test claim: PA required; PA submitted to above mentioned insurance via CoverMyMeds Key/confirmation #/EOC [facility] Status is pending

## 2024-01-11 NOTE — Telephone Encounter (Signed)
 Pharmacy Patient Advocate Encounter  Received notification from [facility] that Prior Authorization for [facility] 12.5MG /0.5ML PEN has been APPROVED from 01/11/2024 to 01/10/2025. Ran test claim, Copay is $135.00. This test claim was processed through [facility]- copay amounts may vary at other pharmacies due to pharmacy/plan contracts, or as the patient moves through the different stages of their insurance plan.   PA #/Case ID/Reference #: [id number]  COPAY IS FOR A 3 MONTH SUPPLY

## 2024-01-11 NOTE — Telephone Encounter (Signed)
 Pharmacy Patient Advocate Encounter   Received notification from Patient Pharmacy that prior authorization for REPATHA 140MG /ML AUTO-INJECTORS is required/requested.   Insurance verification completed.   The patient is insured through [facility] .   Per test claim: The current 28 day co-pay is, $45.00.  No PA needed at this time. This test claim was processed through [facility]- copay amounts may vary at other pharmacies due to pharmacy/plan contracts, or as the patient moves through the different stages of their insurance plan.    **called [facility] special[facility] pharmacy and advised that the plan would only cover 1 month of medication at a time and not 3 months. They reprocessed claim and received payment for the 1 month supply.

## 2024-01-22 ENCOUNTER — Telehealth: Payer: Self-pay | Admitting: Pharmacy Technician

## 2024-01-22 ENCOUNTER — Telehealth: Payer: Self-pay | Admitting: Family Medicine

## 2024-01-22 ENCOUNTER — Other Ambulatory Visit (HOSPITAL_COMMUNITY): Payer: Self-pay

## 2024-01-22 NOTE — Telephone Encounter (Unsigned)
 Copied from CRM [id number]. Topic: Clinical - Prescription Issue >> Jan 22, 2024 10:37 AM Priscille Loveless wrote: Reason for CRM:   Evolocumab (REPATHA SURECLICK) 140 MG/ML SOAJ  Pt got a letter from [facility] stating that this medication is not  (under formulary) covered. Pt is asking is there another medication that she can take or can we submit information to insurance to show why she needs this medication. Please advise patient. Thank you!

## 2024-01-22 NOTE — Telephone Encounter (Signed)
 PA request has been Started. New Encounter has been or will be created for follow up. For additional info see Pharmacy Prior Auth telephone encounter from 01/22/2024.

## 2024-01-22 NOTE — Telephone Encounter (Signed)
 Pharmacy Patient Advocate Encounter   Received notification from Pt Calls Messages that prior authorization for REPATHA SURECLICK 140MG /ML AUTO-INJECTORS is required/requested.   Insurance verification completed.   The patient is insured through [facility] .   Per test claim: PA required; PA submitted to above mentioned insurance via Phone Key/confirmation #/EOC [id number] Status is pending

## 2024-01-22 NOTE — Telephone Encounter (Signed)
 Pharmacy Patient Advocate Encounter  Received notification from [facility] that Prior Authorization for REPATHA SURECLICK 140MG /ML AUTO-INJECTORS has been APPROVED from 01/22/2024 to 01/21/2025

## 2024-02-09 ENCOUNTER — Other Ambulatory Visit (HOSPITAL_COMMUNITY): Payer: Self-pay

## 2024-02-26 ENCOUNTER — Ambulatory Visit (HOSPITAL_COMMUNITY)
Admission: RE | Admit: 2024-02-26 | Discharge: 2024-02-26 | Disposition: A | Discharge status: Home / Self Care | Payer: Commercial Managed Care - PPO | Source: Ambulatory Visit | Attending: Family Medicine | Admitting: Family Medicine

## 2024-02-26 DIAGNOSIS — Z1231 Encounter for screening mammogram for malignant neoplasm of breast: Secondary | ICD-10-CM | POA: Diagnosis not present

## 2024-02-29 DIAGNOSIS — K080 Exfoliation of teeth due to systemic causes: Secondary | ICD-10-CM | POA: Diagnosis not present

## 2024-03-12 DIAGNOSIS — H2511 Age-related nuclear cataract, right eye: Secondary | ICD-10-CM | POA: Diagnosis not present

## 2024-03-12 DIAGNOSIS — E119 Type 2 diabetes mellitus without complications: Secondary | ICD-10-CM | POA: Diagnosis not present

## 2024-03-12 DIAGNOSIS — H35371 Puckering of macula, right eye: Secondary | ICD-10-CM | POA: Diagnosis not present

## 2024-03-12 DIAGNOSIS — H25812 Combined forms of age-related cataract, left eye: Secondary | ICD-10-CM | POA: Diagnosis not present

## 2024-03-13 ENCOUNTER — Encounter: Payer: Self-pay | Admitting: Family Medicine

## 2024-03-13 ENCOUNTER — Ambulatory Visit: Payer: Commercial Managed Care - PPO | Admitting: Family Medicine

## 2024-03-13 VITALS — BP 109/64 | HR 74 | Resp 18 | Ht 61.5 in | Wt 175.1 lb

## 2024-03-13 DIAGNOSIS — Z0001 Encounter for general adult medical examination with abnormal findings: Secondary | ICD-10-CM

## 2024-03-13 DIAGNOSIS — R9389 Abnormal findings on diagnostic imaging of other specified body structures: Secondary | ICD-10-CM

## 2024-03-13 DIAGNOSIS — I2510 Atherosclerotic heart disease of native coronary artery without angina pectoris: Secondary | ICD-10-CM

## 2024-03-13 DIAGNOSIS — Z780 Asymptomatic menopausal state: Secondary | ICD-10-CM | POA: Diagnosis not present

## 2024-03-13 DIAGNOSIS — Z23 Encounter for immunization: Secondary | ICD-10-CM | POA: Diagnosis not present

## 2024-03-13 DIAGNOSIS — E782 Mixed hyperlipidemia: Secondary | ICD-10-CM

## 2024-03-13 DIAGNOSIS — E1169 Type 2 diabetes mellitus with other specified complication: Secondary | ICD-10-CM | POA: Diagnosis not present

## 2024-03-13 DIAGNOSIS — Z0000 Encounter for general adult medical examination without abnormal findings: Secondary | ICD-10-CM

## 2024-03-13 LAB — HM DIABETES EYE EXAM

## 2024-03-13 NOTE — Patient Instructions (Addendum)
 F/U in 4.5 months  cBC, HBA1C, cmp and EGFr, today  Next colonoscopy is due in 12/2024  Pneumonia 20 today    Dexa to be scheduled at checkout  Expect dose inc in mounjaro  when next due   Continue annual mammograms  Start calcium  with vit D 1000m 1000 international units   vit daily

## 2024-03-13 NOTE — Progress Notes (Unsigned)
 Subjective:    Alicia Neal is a 63 y.o. female who presents for a Welcome to Medicare exam.          Objective:    Today's Vitals   03/13/24 1531  BP: 109/64  Pulse: 74  Resp: 18  SpO2: 96%  Weight: 175 lb 1.3 oz (79.4 kg)  Height: 5' 1.5" (1.562 m)  Body mass index is 32.55 kg/m.  Medications Outpatient Encounter Medications as of 03/13/2024  Medication Sig   amLODipine  (NORVASC ) 10 MG tablet Take 1 tablet (10 mg total) by mouth every evening.   citalopram  (CELEXA ) 20 MG tablet Take 1 tablet (20 mg total) by mouth daily.   clotrimazole -betamethasone  (LOTRISONE ) cream Apply 1 Application topically daily.   Evolocumab  (REPATHA  SURECLICK) 140 MG/ML SOAJ Inject 140 mg into the skin every 14 days.   loratadine  (CLARITIN ) 10 MG tablet Take 1 tablet (10 mg total) by mouth in the morning.   losartan  (COZAAR ) 100 MG tablet Take 1 tablet (100 mg total) by mouth daily.   pantoprazole  (PROTONIX ) 40 MG tablet Take 1 tablet (40 mg total) by mouth daily.   temazepam  (RESTORIL ) 15 MG capsule Take 1 capsule (15 mg total) by mouth at bedtime as needed for sleep.   tirzepatide  (MOUNJARO ) 12.5 MG/0.5ML Pen Inject 12.5 mg into the skin once a week.   [DISCONTINUED] predniSONE  (DELTASONE ) 10 MG tablet Take 1 tablet (10 mg total) by mouth 2 (two) times daily with a meal.   No facility-administered encounter medications on file as of 03/13/2024.     History: Past Medical History:  Diagnosis Date   Adrenal adenoma    right side   Allergic rhinitis    Allergy    cat,dog,animal dander   Anxiety disorder    Carpal tunnel syndrome of left wrist    had surgery to repair   Chronic depression    Dr. Onetha Bile   GERD (gastroesophageal reflux disease)    History of abnormal cervical Pap smear    History of adenomatous polyp of colon    History of hiatal hernia    Hyperlipidemia    Hypertension    IBS (irritable bowel syndrome)    Pemphigoid, cicatricial    pruritic autoimmune  blistering skin disorder   PONV (postoperative nausea and vomiting)    Pulmonary nodule, right pulmologist-  dr Dufm Gibbon mcquaid   x2    per CT 09-11-2015   Trigger finger, left    ring finger   Type 2 diabetes mellitus (HCC)    Uncontrolled type 2 diabetes mellitus with hyperglycemia (HCC) 04/17/2018   Wears glasses    Past Surgical History:  Procedure Laterality Date   CARDIOVASCULAR STRESS TEST  04-30-2003   Small focus of decreased perfusion on rest images in the mid-distal anterior wall (worrisome for ischemia),  normal LV function and wall motion , ef 60%   CARPAL TUNNEL RELEASE Left 11/26/2015   Procedure: CARPAL TUNNEL RELEASE;  Surgeon: Arvil Birks, MD;  Location: [facility];  Service: Orthopedics;  Laterality: Left;   CHOLECYSTECTOMY  1989   COLONOSCOPY  11/04/2009   ORIF ANKLE FRACTURE Left 03/23/2023   Procedure: OPEN REDUCTION INTERNAL FIXATION (ORIF) ANKLE FRACTURE;  Surgeon: Tonita Frater, MD;  Location: [facility];  Service: Orthopedics;  Laterality: Left;   POLYPECTOMY     TRANSTHORACIC ECHOCARDIOGRAM  04-30-2003   mild hypokinetic in the inferior and posterior wall, ef 50%/  trivial MR /  mild TR   TRIGGER FINGER RELEASE Left 11/26/2015  Procedure: RELEASE TRIGGER FINGER/A-1 PULLEY LEFT RING  FINGER;  Surgeon: Arvil Birks, MD;  Location: [facility];  Service: Orthopedics;  Laterality: Left;   TUBAL LIGATION  2000    Family History  Problem Relation Age of Onset   Hypertension Paternal Grandmother    Stroke Paternal Grandmother    Cancer Maternal Grandmother        colon and vaginal   Cancer Maternal Grandfather        colon   COPD Maternal Grandfather    Lung cancer Father    Diabetes Father    Cancer Father        lung   Arthritis Mother    Cancer Brother        skin   Colon cancer Neg Hx    Colon polyps Neg Hx    Esophageal cancer Neg Hx    Rectal cancer Neg Hx    Stomach cancer Neg Hx    Social History   Occupational History   Not on file  Tobacco Use    Smoking status: Former    Current packs/day: 0.25    Average packs/day: 0.3 packs/day for 40.0 years (10.0 ttl pk-yrs)    Types: Cigarettes   Smokeless tobacco: Never  Vaping Use   Vaping status: Never Used  Substance and Sexual Activity   Alcohol use: Yes    Alcohol/week: 0.0 standard drinks of alcohol    Comment: occasional   Drug use: No   Sexual activity: Not Currently    Birth control/protection: Post-menopausal, Surgical    Comment: tubal    Tobacco Counseling Counseling given: Not Answered   Immunizations and Health Maintenance Immunization History  Administered Date(s) Administered   Influenza Split 07/19/2013, 07/22/2014   Influenza,inj,Quad PF,6+ Mos 07/28/2022, 07/20/2023   Influenza-Unspecified 07/28/2020, 08/12/2021   Moderna Sars-Covid-2 Vaccination 11/05/2019, 11/13/2019, 09/10/2020   Pneumococcal Conjugate-13 06/25/2014   Pneumococcal Polysaccharide-23 07/29/2019   Tdap 06/25/2014   Zoster Recombinant(Shingrix ) 06/12/2018   Health Maintenance Due  Topic Date Due   COVID-19 Vaccine (4 - 2024-25 season) 07/09/2023   DEXA SCAN  Never done   OPHTHALMOLOGY EXAM  03/07/2024    Activities of Daily Living    03/13/2024    3:33 PM  In your present state of health, do you have any difficulty performing the following activities:  Hearing? 0  Vision? 0  Difficulty concentrating or making decisions? 0  Walking or climbing stairs? 0  Dressing or bathing? 0  Doing errands, shopping? 0  Preparing Food and eating ? N  Using the Toilet? N  In the past six months, have you accidently leaked urine? N  Do you have problems with loss of bowel control? N  Managing your Medications? N  Managing your Finances? N  Housekeeping or managing your Housekeeping? N    Physical Exam   Physical Exam Constitutional:      Appearance: Normal appearance.  HENT:     Head: Normocephalic.  Eyes:     Extraocular Movements: Extraocular movements intact.  Cardiovascular:      Rate and Rhythm: Normal rate.  Pulmonary:     Effort: Pulmonary effort is normal.  Musculoskeletal:     Cervical back: Neck supple.  Neurological:     Mental Status: She is alert.    (optional), or other factors deemed appropriate based on the beneficiary's medical and social history and current clinical standards.  BP 109/64   Pulse 74   Resp 18   Ht 5' 1.5" (1.562 m)  Wt 175 lb 1.3 oz (79.4 kg)   SpO2 96%   BMI 32.55 kg/m   Advanced Directives:  Already has advanced directives which she will review  EKG:  normal EKG, normal sinus rhythm, unchanged from previous tracings, normal sinus rhythm      Assessment:    This is a routine wellness examination for this patient . Manque Hemmer  Vision/Hearing screen No results found.   Goals      Patient Stated     "I would like to learn to hula hoop"        Depression Screen    03/13/2024    3:36 PM 11/28/2023    4:07 PM 08/29/2023    4:07 PM 07/03/2023    9:44 AM  PHQ 2/9 Scores  PHQ - 2 Score 0 0 6 6  PHQ- 9 Score   10 15     Fall Risk    03/13/2024    3:38 PM  Fall Risk   Falls in the past year? 1  Number falls in past yr: 0  Injury with Fall? 1    Cognitive Function:        03/13/2024    3:39 PM  6CIT Screen  What Year? 0 points  What month? 0 points  What time? 0 points  Count back from 20 0 points  Months in reverse 0 points  Repeat phrase 0 points  Total Score 0 points    Patient Care Team: Towanda Fret, MD as PCP - General Mallipeddi, Kennyth Pean, MD as PCP - Cardiology (Cardiology)     Plan:   Encounter for Medicare annual wellness exam Welcome to medicare exam as documented. Counseling done  re healthy lifestyle involving commitment to 150 minutes exercise per week, heart healthy diet, and attaining healthy weight.The importance of adequate sleep also discussed.  Immunization and cancer screening needs are specifically addressed at this visit.   Atherosclerosis eKG in office  nSR no acute ischemia , denies angina, however multiple CVrisk factors with abn scan , refer Cardiology   Post-menopause Commit to daily calcium  with D and weight bearing exercie  Obtain dexa  Type 2 diabetes mellitus with other specified complication (HCC) Diabetes associated with hypertension, hyperlipidemia, and obesity  Ms. Petrich is reminded of the importance of commitment to daily physical activity for 30 minutes or more, as able and the need to limit carbohydrate intake to 30 to 60 grams per meal to help with blood sugar control.   The need to take medication as prescribed, test blood sugar as directed, and to call between visits if there is a concern that blood sugar is uncontrolled is also discussed.   Ms. Fetters is reminded of the importance of daily foot exam, annual eye examination, and good blood sugar, blood pressure and cholesterol control.     Latest Ref Rng & Units 03/13/2024    4:46 PM 12/06/2023    8:11 AM 08/24/2023    9:56 AM 08/24/2023    9:55 AM 04/07/2023   11:17 AM  Diabetic Labs  HbA1c 4.8 - 5.6 % 7.0  7.4  7.2   6.7   Microalbumin Not Estab. ug/mL    <3.0    Micro/Creat Ratio 0 - 29 mg/g creat    <10    Chol 100 - 199 mg/dL  914   782    HDL >95 mg/dL  55   62    Calc LDL 0 - 99 mg/dL  71   621  Triglycerides 0 - 149 mg/dL  161   096    Creatinine 0.57 - 1.00 mg/dL 0.45  4.09  8.11         03/13/2024    3:31 PM 11/28/2023    4:06 PM 08/29/2023    4:05 PM 07/03/2023    9:39 AM 06/15/2023    8:44 AM 03/31/2023    8:19 AM 03/23/2023   10:50 AM  BP/Weight  Systolic BP 109 128 123 134 113  106  Diastolic BP 64 85 72 80 71  61  Wt. (Lbs) 175.08 174.08 177.04 173.6 174.12 174   BMI 32.55 kg/m2 31.84 kg/m2 32.38 kg/m2 32.8 kg/m2 31.85 kg/m2 31.83 kg/m2       Latest Ref Rng & Units 03/08/2023   12:00 AM 08/03/2022    8:20 AM  Foot/eye exam completion dates  Eye Exam No Retinopathy No Retinopathy       Foot Form Completion   Done     This result is from an  external source.      Increase mounjaro  dose  Atherosclerosis of native coronary artery of native heart without angina pectoris Refer card for furhter eval. EKG in office within normal, no angina reported  Abnormal CT scan, chest Advanced CAD noted , card to re eval   I have personally reviewed and noted the following in the patient's chart:   Medical and social history Use of alcohol, tobacco or illicit drugs  Current medications and supplements including opioid prescriptions. Patient I currently not on opioids Functional ability and status Nutritional status Physical activity Advanced directives List of other physicians Hospitalizations, surgeries, and ER visits in previous 12 months Vitals Screenings to include cognitive, depression, and falls Referrals and appointments  In addition, I have reviewed and discussed with patient certain preventive protocols, quality metrics, and best practice recommendations. A written personalized care plan for preventive services as well as general preventive health recommendations were provided to patient.     Inetta Manes, CMA 03/13/2024

## 2024-03-14 ENCOUNTER — Encounter: Payer: Self-pay | Admitting: Family Medicine

## 2024-03-14 DIAGNOSIS — Z0000 Encounter for general adult medical examination without abnormal findings: Secondary | ICD-10-CM | POA: Insufficient documentation

## 2024-03-14 DIAGNOSIS — I2510 Atherosclerotic heart disease of native coronary artery without angina pectoris: Secondary | ICD-10-CM | POA: Insufficient documentation

## 2024-03-14 DIAGNOSIS — Z780 Asymptomatic menopausal state: Secondary | ICD-10-CM | POA: Insufficient documentation

## 2024-03-14 DIAGNOSIS — I7090 Unspecified atherosclerosis: Secondary | ICD-10-CM | POA: Insufficient documentation

## 2024-03-14 LAB — CMP14+EGFR
ALT: 17 IU/L (ref 0–32)
AST: 16 IU/L (ref 0–40)
Albumin: 4.3 g/dL (ref 3.9–4.9)
Alkaline Phosphatase: 92 IU/L (ref 44–121)
BUN/Creatinine Ratio: 17 (ref 12–28)
BUN: 13 mg/dL (ref 8–27)
Bilirubin Total: <0.2 mg/dL (ref 0.0–1.2)
CO2: 23 mmol/L (ref 20–29)
Calcium: 8.7 mg/dL (ref 8.7–10.3)
Chloride: 100 mmol/L (ref 96–106)
Creatinine, Ser: 0.76 mg/dL (ref 0.57–1.00)
Globulin, Total: 2.3 g/dL (ref 1.5–4.5)
Glucose: 102 mg/dL — ABNORMAL HIGH (ref 70–99)
Potassium: 4.2 mmol/L (ref 3.5–5.2)
Sodium: 138 mmol/L (ref 134–144)
Total Protein: 6.6 g/dL (ref 6.0–8.5)
eGFR: 87 mL/min/{1.73_m2} (ref >59)

## 2024-03-14 LAB — CBC WITH DIFFERENTIAL/PLATELET
Basophils Absolute: 0.1 10*3/uL (ref 0.0–0.2)
Basos: 1 %
EOS (ABSOLUTE): 0.5 10*3/uL — ABNORMAL HIGH (ref 0.0–0.4)
Eos: 6 %
Hematocrit: 39.4 % (ref 34.0–46.6)
Hemoglobin: 13.3 g/dL (ref 11.1–15.9)
Immature Grans (Abs): 0 10*3/uL (ref 0.0–0.1)
Immature Granulocytes: 0 %
Lymphocytes Absolute: 2.5 10*3/uL (ref 0.7–3.1)
Lymphs: 28 %
MCH: 31.1 pg (ref 26.6–33.0)
MCHC: 33.8 g/dL (ref 31.5–35.7)
MCV: 92 fL (ref 79–97)
Monocytes Absolute: 0.6 10*3/uL (ref 0.1–0.9)
Monocytes: 7 %
Neutrophils Absolute: 5.1 10*3/uL (ref 1.4–7.0)
Neutrophils: 58 %
Platelets: 328 10*3/uL (ref 150–450)
RBC: 4.27 x10E6/uL (ref 3.77–5.28)
RDW: 12.5 % (ref 11.7–15.4)
WBC: 8.9 10*3/uL (ref 3.4–10.8)

## 2024-03-14 LAB — HEMOGLOBIN A1C
Est. average glucose Bld gHb Est-mCnc: 154 mg/dL
Hgb A1c MFr Bld: 7 % — ABNORMAL HIGH (ref 4.8–5.6)

## 2024-03-14 MED ORDER — TIRZEPATIDE 15 MG/0.5ML ~~LOC~~ SOAJ: 15.0000 mg | SUBCUTANEOUS | 1 refills | Status: AC

## 2024-03-14 NOTE — Assessment & Plan Note (Signed)
 Diabetes associated with hypertension, hyperlipidemia, and obesity  Ms. Marcum is reminded of the importance of commitment to daily physical activity for 30 minutes or more, as able and the need to limit carbohydrate intake to 30 to 60 grams per meal to help with blood sugar control.   The need to take medication as prescribed, test blood sugar as directed, and to call between visits if there is a concern that blood sugar is uncontrolled is also discussed.   Ms. Barreiro is reminded of the importance of daily foot exam, annual eye examination, and good blood sugar, blood pressure and cholesterol control.     Latest Ref Rng & Units 03/13/2024    4:46 PM 12/06/2023    8:11 AM 08/24/2023    9:56 AM 08/24/2023    9:55 AM 04/07/2023   11:17 AM  Diabetic Labs  HbA1c 4.8 - 5.6 % 7.0  7.4  7.2   6.7   Microalbumin Not Estab. ug/mL    <3.0    Micro/Creat Ratio 0 - 29 mg/g creat    <10    Chol 100 - 199 mg/dL  161   096    HDL >04 mg/dL  55   62    Calc LDL 0 - 99 mg/dL  71   540    Triglycerides 0 - 149 mg/dL  981   191    Creatinine 0.57 - 1.00 mg/dL 4.78  2.95  6.21         03/13/2024    3:31 PM 11/28/2023    4:06 PM 08/29/2023    4:05 PM 07/03/2023    9:39 AM 06/15/2023    8:44 AM 03/31/2023    8:19 AM 03/23/2023   10:50 AM  BP/Weight  Systolic BP 109 128 123 134 113  106  Diastolic BP 64 85 72 80 71  61  Wt. (Lbs) 175.08 174.08 177.04 173.6 174.12 174   BMI 32.55 kg/m2 31.84 kg/m2 32.38 kg/m2 32.8 kg/m2 31.85 kg/m2 31.83 kg/m2       Latest Ref Rng & Units 03/08/2023   12:00 AM 08/03/2022    8:20 AM  Foot/eye exam completion dates  Eye Exam No Retinopathy No Retinopathy       Foot Form Completion   Done     This result is from an external source.      Increase mounjaro  dose

## 2024-03-14 NOTE — Assessment & Plan Note (Signed)
 eKG in office nSR no acute ischemia , denies angina, however multiple CVrisk factors with abn scan , refer Cardiology

## 2024-03-14 NOTE — Assessment & Plan Note (Signed)
 Refer card for furhter eval. EKG in office within normal, no angina reported

## 2024-03-14 NOTE — Assessment & Plan Note (Signed)
 Commit to daily calcium  with D and weight bearing exercie  Obtain dexa

## 2024-03-14 NOTE — Assessment & Plan Note (Signed)
 Advanced CAD noted , card to re eval

## 2024-03-14 NOTE — Progress Notes (Signed)
 Subjective:    Alicia Neal is a 63 y.o. female who presents for a Welcome to Medicare exam.          Objective:    Today's Vitals   03/13/24 1531  BP: 109/64  Pulse: 74  Resp: 18  SpO2: 96%  Weight: 175 lb 1.3 oz (79.4 kg)  Height: 5' 1.5" (1.562 m)  Body mass index is 32.55 kg/m.  Medications Outpatient Encounter Medications as of 03/13/2024  Medication Sig   amLODipine  (NORVASC ) 10 MG tablet Take 1 tablet (10 mg total) by mouth every evening.   citalopram  (CELEXA ) 20 MG tablet Take 1 tablet (20 mg total) by mouth daily.   clotrimazole -betamethasone  (LOTRISONE ) cream Apply 1 Application topically daily.   Evolocumab  (REPATHA  SURECLICK) 140 MG/ML SOAJ Inject 140 mg into the skin every 14 days.   loratadine  (CLARITIN ) 10 MG tablet Take 1 tablet (10 mg total) by mouth in the morning.   losartan  (COZAAR ) 100 MG tablet Take 1 tablet (100 mg total) by mouth daily.   pantoprazole  (PROTONIX ) 40 MG tablet Take 1 tablet (40 mg total) by mouth daily.   temazepam  (RESTORIL ) 15 MG capsule Take 1 capsule (15 mg total) by mouth at bedtime as needed for sleep.   tirzepatide  (MOUNJARO ) 12.5 MG/0.5ML Pen Inject 12.5 mg into the skin once a week.   tirzepatide  (MOUNJARO ) 15 MG/0.5ML Pen Inject 15 mg into the skin once a week.   [DISCONTINUED] predniSONE  (DELTASONE ) 10 MG tablet Take 1 tablet (10 mg total) by mouth 2 (two) times daily with a meal.   No facility-administered encounter medications on file as of 03/13/2024.     History: Past Medical History:  Diagnosis Date   Adrenal adenoma    right side   Allergic rhinitis    Allergy    cat,dog,animal dander   Anxiety disorder    Carpal tunnel syndrome of left wrist    had surgery to repair   Chronic depression    Dr. Onetha Bile   GERD (gastroesophageal reflux disease)    History of abnormal cervical Pap smear    History of adenomatous polyp of colon    History of hiatal hernia    Hyperlipidemia    Hypertension    IBS  (irritable bowel syndrome)    Pemphigoid, cicatricial    pruritic autoimmune blistering skin disorder   PONV (postoperative nausea and vomiting)    Pulmonary nodule, right pulmologist-  dr Dufm Gibbon mcquaid   x2    per CT 09-11-2015   Trigger finger, left    ring finger   Type 2 diabetes mellitus (HCC)    Uncontrolled type 2 diabetes mellitus with hyperglycemia (HCC) 04/17/2018   Wears glasses    Past Surgical History:  Procedure Laterality Date   CARDIOVASCULAR STRESS TEST  04-30-2003   Small focus of decreased perfusion on rest images in the mid-distal anterior wall (worrisome for ischemia),  normal LV function and wall motion , ef 60%   CARPAL TUNNEL RELEASE Left 11/26/2015   Procedure: CARPAL TUNNEL RELEASE;  Surgeon: Arvil Birks, MD;  Location: [facility];  Service: Orthopedics;  Laterality: Left;   CHOLECYSTECTOMY  1989   COLONOSCOPY  11/04/2009   ORIF ANKLE FRACTURE Left 03/23/2023   Procedure: OPEN REDUCTION INTERNAL FIXATION (ORIF) ANKLE FRACTURE;  Surgeon: Tonita Frater, MD;  Location: [facility];  Service: Orthopedics;  Laterality: Left;   POLYPECTOMY     TRANSTHORACIC ECHOCARDIOGRAM  04-30-2003   mild hypokinetic in the inferior and posterior wall,  ef 50%/  trivial MR /  mild TR   TRIGGER FINGER RELEASE Left 11/26/2015   Procedure: RELEASE TRIGGER FINGER/A-1 PULLEY LEFT RING  FINGER;  Surgeon: Arvil Birks, MD;  Location: [facility];  Service: Orthopedics;  Laterality: Left;   TUBAL LIGATION  2000    Family History  Problem Relation Age of Onset   Hypertension Paternal Grandmother    Stroke Paternal Grandmother    Cancer Maternal Grandmother        colon and vaginal   Cancer Maternal Grandfather        colon   COPD Maternal Grandfather    Lung cancer Father    Diabetes Father    Cancer Father        lung   Arthritis Mother    Cancer Brother        skin   Colon cancer Neg Hx    Colon polyps Neg Hx    Esophageal cancer Neg Hx    Rectal cancer Neg Hx    Stomach cancer Neg Hx     Social History   Occupational History   Not on file  Tobacco Use   Smoking status: Former    Current packs/day: 0.25    Average packs/day: 0.3 packs/day for 40.0 years (10.0 ttl pk-yrs)    Types: Cigarettes   Smokeless tobacco: Never  Vaping Use   Vaping status: Never Used  Substance and Sexual Activity   Alcohol use: Yes    Alcohol/week: 0.0 standard drinks of alcohol    Comment: occasional   Drug use: No   Sexual activity: Not Currently    Birth control/protection: Post-menopausal, Surgical    Comment: tubal    Tobacco Counseling Counseling given: Not Answered   Immunizations and Health Maintenance Immunization History  Administered Date(s) Administered   Influenza Split 07/19/2013, 07/22/2014   Influenza,inj,Quad PF,6+ Mos 07/28/2022, 07/20/2023   Influenza-Unspecified 07/28/2020, 08/12/2021   Moderna Sars-Covid-2 Vaccination 11/05/2019, 11/13/2019, 09/10/2020   PNEUMOCOCCAL CONJUGATE-20 03/13/2024   Pneumococcal Conjugate-13 06/25/2014   Pneumococcal Polysaccharide-23 07/29/2019   Tdap 06/25/2014   Zoster Recombinant(Shingrix ) 06/12/2018   Health Maintenance Due  Topic Date Due   COVID-19 Vaccine (4 - 2024-25 season) 07/09/2023   DEXA SCAN  Never done   OPHTHALMOLOGY EXAM  03/07/2024    Activities of Daily Living    03/13/2024    3:33 PM  In your present state of health, do you have any difficulty performing the following activities:  Hearing? 0  Vision? 0  Difficulty concentrating or making decisions? 0  Walking or climbing stairs? 0  Dressing or bathing? 0  Doing errands, shopping? 0  Preparing Food and eating ? N  Using the Toilet? N  In the past six months, have you accidently leaked urine? N  Do you have problems with loss of bowel control? N  Managing your Medications? N  Managing your Finances? N  Housekeeping or managing your Housekeeping? N    Physical Exam   Physical Exam (optional), or other factors deemed appropriate based on the  beneficiary's medical and social history and current clinical standards.  BP 109/64   Pulse 74   Resp 18   Ht 5' 1.5" (1.562 m)   Wt 175 lb 1.3 oz (79.4 kg)   SpO2 96%   BMI 32.55 kg/m   Advanced Directives:   Reports already having a living will on record, will review independently to determine if accurate. Does not want prolongation of life if she has a terminal illness and  or poor quality of life  EKG:  normal EKG, normal sinus rhythm, unchanged from previous tracings, normal sinus rhythm      Assessment:    This is a routine wellness examination for this patient .   Vision/Hearing screen No results found.   Goals      Patient Stated     "I would like to learn to hula hoop"        Depression Screen    03/13/2024    3:36 PM 11/28/2023    4:07 PM 08/29/2023    4:07 PM 07/03/2023    9:44 AM  PHQ 2/9 Scores  PHQ - 2 Score 0 0 6 6  PHQ- 9 Score   10 15     Fall Risk    03/13/2024    3:38 PM  Fall Risk   Falls in the past year? 1  Number falls in past yr: 0  Injury with Fall? 1    Cognitive Function:        03/13/2024    3:39 PM  6CIT Screen  What Year? 0 points  What month? 0 points  What time? 0 points  Count back from 20 0 points  Months in reverse 0 points  Repeat phrase 0 points  Total Score 0 points    Patient Care Team: Towanda Fret, MD as PCP - General Mallipeddi, Kennyth Pean, MD as PCP - Cardiology (Cardiology)     Plan:   Encounter for Medicare annual wellness exam Welcome to medicare exam as documented. Counseling done  re healthy lifestyle involving commitment to 150 minutes exercise per week, heart healthy diet, and attaining healthy weight.The importance of adequate sleep also discussed.  Immunization and cancer screening needs are specifically addressed at this visit.   Atherosclerosis eKG in office nSR no acute ischemia , denies angina, however multiple CVrisk factors with abn scan , refer Cardiology    Post-menopause Commit to daily calcium  with D and weight bearing exercie  Obtain dexa  Type 2 diabetes mellitus with other specified complication (HCC) Diabetes associated with hypertension, hyperlipidemia, and obesity  Ms. Schoenecker is reminded of the importance of commitment to daily physical activity for 30 minutes or more, as able and the need to limit carbohydrate intake to 30 to 60 grams per meal to help with blood sugar control.   The need to take medication as prescribed, test blood sugar as directed, and to call between visits if there is a concern that blood sugar is uncontrolled is also discussed.   Ms. Hults is reminded of the importance of daily foot exam, annual eye examination, and good blood sugar, blood pressure and cholesterol control.     Latest Ref Rng & Units 03/13/2024    4:46 PM 12/06/2023    8:11 AM 08/24/2023    9:56 AM 08/24/2023    9:55 AM 04/07/2023   11:17 AM  Diabetic Labs  HbA1c 4.8 - 5.6 % 7.0  7.4  7.2   6.7   Microalbumin Not Estab. ug/mL    <3.0    Micro/Creat Ratio 0 - 29 mg/g creat    <10    Chol 100 - 199 mg/dL  086   578    HDL >46 mg/dL  55   62    Calc LDL 0 - 99 mg/dL  71   962    Triglycerides 0 - 149 mg/dL  952   841    Creatinine 0.57 - 1.00 mg/dL 3.24  4.01  0.72         03/13/2024    3:31 PM 11/28/2023    4:06 PM 08/29/2023    4:05 PM 07/03/2023    9:39 AM 06/15/2023    8:44 AM 03/31/2023    8:19 AM 03/23/2023   10:50 AM  BP/Weight  Systolic BP 109 128 123 134 113  106  Diastolic BP 64 85 72 80 71  61  Wt. (Lbs) 175.08 174.08 177.04 173.6 174.12 174   BMI 32.55 kg/m2 31.84 kg/m2 32.38 kg/m2 32.8 kg/m2 31.85 kg/m2 31.83 kg/m2       Latest Ref Rng & Units 03/08/2023   12:00 AM 08/03/2022    8:20 AM  Foot/eye exam completion dates  Eye Exam No Retinopathy No Retinopathy       Foot Form Completion   Done     This result is from an external source.      Increase mounjaro  dose  Atherosclerosis of native coronary artery of native  heart without angina pectoris Refer card for furhter eval. EKG in office within normal, no angina reported  Abnormal CT scan, chest Advanced CAD noted , card to re eval   I have personally reviewed and noted the following in the patient's chart:   Medical and social history Use of alcohol, tobacco or illicit drugs  Current medications and supplements including opioid prescriptions. Patient is not currently taking opioid prescriptions. Functional ability and status Nutritional status Physical activity Advanced directives List of other physicians Hospitalizations, surgeries, and ER visits in previous 12 months Vitals Screenings to include cognitive, depression, and falls Referrals and appointments  In addition, I have reviewed and discussed with patient certain preventive protocols, quality metrics, and best practice recommendations. A written personalized care plan for preventive services as well as general preventive health recommendations were provided to patient.     Alberteen Huge, MD 03/14/2024

## 2024-03-14 NOTE — Assessment & Plan Note (Signed)
Welcome to medicare  exam as documented. Counseling done  re healthy lifestyle involving commitment to 150 minutes exercise per week, heart healthy diet, and attaining healthy weight.The importance of adequate sleep also discussed.  Immunization and cancer screening needs are specifically addressed at this visit.

## 2024-03-19 ENCOUNTER — Other Ambulatory Visit: Payer: Self-pay | Admitting: Family Medicine

## 2024-03-29 DIAGNOSIS — H33103 Unspecified retinoschisis, bilateral: Secondary | ICD-10-CM | POA: Diagnosis not present

## 2024-03-29 DIAGNOSIS — H35371 Puckering of macula, right eye: Secondary | ICD-10-CM | POA: Diagnosis not present

## 2024-03-29 DIAGNOSIS — H2513 Age-related nuclear cataract, bilateral: Secondary | ICD-10-CM | POA: Diagnosis not present

## 2024-04-02 ENCOUNTER — Other Ambulatory Visit: Payer: Self-pay

## 2024-04-09 DIAGNOSIS — H25811 Combined forms of age-related cataract, right eye: Secondary | ICD-10-CM | POA: Diagnosis not present

## 2024-04-15 ENCOUNTER — Telehealth: Payer: Self-pay

## 2024-04-15 NOTE — Telephone Encounter (Signed)
 Copied from CRM [id number]. Topic: Clinical - Prescription Issue >> Apr 15, 2024 11:27 AM Juluis Ok wrote: Reason for CRM: Pharmacy change for tirzepatide  (MOUNJARO ) 15 MG/0.5ML Pen:  [facility] - [place], [place] - [address] [place] [address] Phone: [phone] Fax: [phone] Hours: Not open 24 hour

## 2024-04-16 ENCOUNTER — Other Ambulatory Visit: Payer: Self-pay | Admitting: Family Medicine

## 2024-04-17 MED ORDER — TIRZEPATIDE 15 MG/0.5ML ~~LOC~~ SOAJ
15.0000 mg | SUBCUTANEOUS | 3 refills | Status: AC
Start: 2024-04-17 — End: ?

## 2024-04-17 NOTE — Telephone Encounter (Signed)
 Sent to [employer]

## 2024-04-23 DIAGNOSIS — H35371 Puckering of macula, right eye: Secondary | ICD-10-CM | POA: Diagnosis not present

## 2024-05-02 ENCOUNTER — Ambulatory Visit: Attending: Internal Medicine | Admitting: Internal Medicine

## 2024-05-02 ENCOUNTER — Encounter: Payer: Self-pay | Admitting: Internal Medicine

## 2024-05-02 VITALS — BP 120/78 | HR 64 | Ht 61.0 in | Wt 174.0 lb

## 2024-05-02 DIAGNOSIS — I2510 Atherosclerotic heart disease of native coronary artery without angina pectoris: Secondary | ICD-10-CM | POA: Diagnosis not present

## 2024-05-02 DIAGNOSIS — I10 Essential (primary) hypertension: Secondary | ICD-10-CM

## 2024-05-02 DIAGNOSIS — E782 Mixed hyperlipidemia: Secondary | ICD-10-CM

## 2024-05-02 MED ORDER — ASPIRIN 81 MG PO TBEC: 81.0000 mg | DELAYED_RELEASE_TABLET | Freq: Every day | ORAL | 12 refills | Status: AC

## 2024-05-02 NOTE — Patient Instructions (Signed)
 Medication Instructions:  Your physician has recommended you make the following change in your medication:  Please start Aspirin  81 Mg daily   Labwork: None   Testing/Procedures: None   Follow-Up: Your physician recommends that you schedule a follow-up appointment in: As needed   Any Other Special Instructions Will Be Listed Below (If Applicable).  If you need a refill on your cardiac medications before your next appointment, please call your pharmacy.

## 2024-05-02 NOTE — Progress Notes (Signed)
 Cardiology Office Note  Date: 05/02/2024   ID: Alicia Neal, Alicia Neal 09-09-1961, MRN [id number]  PCP:  Antonetta Rollene BRAVO, MD  Cardiologist:  Diannah SHAUNNA Maywood, MD Electrophysiologist:  None   Reason for Office Visit:    History of Present Illness: Patient is a 63 year old female known to have hypertension, diabetes, hyperlipidemia is here for a follow-up visit.    Patient was earlier referred to Dr. Charls in 2019 for coronary calcifications when she underwent noncoronary CT scan and also for elevated LDL. Plan was, aggressive risk factor modification and noninvasive cardiac testing only if the patient is symptomatic. Thereafter, patient was discharged from the cardiology clinic to return back as needed. She is referred back to cardiology for 1 episode of chest heaviness in July 2023 when she was on the boat. Patient reported 1 episode of nausea vomiting when she was on boat in hot summer July 2023 associated with chest heaviness and shortness of breath.  The whole episode lasted for 5 minutes and resolved spontaneously.  She was hence sent by the PCP to cardiology for further evaluation.  She was discharged from cardiology clinic and was referred again for follow-up visit.  Patient denies having any angina or DOE in the last 1 and half year.  Active at baseline.  No dizziness, syncope, palpitations.  Compliant with medications.  No side effects..  Past Medical History:  Diagnosis Date   Adrenal adenoma    right side   Allergic rhinitis    Allergy    cat,dog,animal dander   Anxiety disorder    Carpal tunnel syndrome of left wrist    had surgery to repair   Chronic depression    Dr. Coni   GERD (gastroesophageal reflux disease)    History of abnormal cervical Pap smear    History of adenomatous polyp of colon    History of hiatal hernia    Hyperlipidemia    Hypertension    IBS (irritable bowel syndrome)    Pemphigoid, cicatricial    pruritic autoimmune blistering  skin disorder   PONV (postoperative nausea and vomiting)    Pulmonary nodule, right pulmologist-  dr vicenta mcquaid   x2    per CT 09-11-2015   Trigger finger, left    ring finger   Type 2 diabetes mellitus (HCC)    Uncontrolled type 2 diabetes mellitus with hyperglycemia (HCC) 04/17/2018   Wears glasses     Past Surgical History:  Procedure Laterality Date   CARDIOVASCULAR STRESS TEST  04-30-2003   Small focus of decreased perfusion on rest images in the mid-distal anterior wall (worrisome for ischemia),  normal LV function and wall motion , ef 60%   CARPAL TUNNEL RELEASE Left 11/26/2015   Procedure: CARPAL TUNNEL RELEASE;  Surgeon: Prentice Pagan, MD;  Location: [facility];  Service: Orthopedics;  Laterality: Left;   CHOLECYSTECTOMY  1989   COLONOSCOPY  11/04/2009   ORIF ANKLE FRACTURE Left 03/23/2023   Procedure: OPEN REDUCTION INTERNAL FIXATION (ORIF) ANKLE FRACTURE;  Surgeon: Onesimo Oneil LABOR, MD;  Location: [facility];  Service: Orthopedics;  Laterality: Left;   POLYPECTOMY     TRANSTHORACIC ECHOCARDIOGRAM  04-30-2003   mild hypokinetic in the inferior and posterior wall, ef 50%/  trivial MR /  mild TR   TRIGGER FINGER RELEASE Left 11/26/2015   Procedure: RELEASE TRIGGER FINGER/A-1 PULLEY LEFT RING  FINGER;  Surgeon: Prentice Pagan, MD;  Location: [facility];  Service: Orthopedics;  Laterality: Left;   TUBAL LIGATION  2000  Current Outpatient Medications  Medication Sig Dispense Refill   amLODipine  (NORVASC ) 10 MG tablet Take 1 tablet (10 mg total) by mouth every evening. 90 tablet 3   citalopram  (CELEXA ) 20 MG tablet Take 1 tablet (20 mg total) by mouth daily. 90 tablet 3   clotrimazole -betamethasone  (LOTRISONE ) cream Apply 1 Application topically daily. 45 g 1   loratadine  (CLARITIN ) 10 MG tablet Take 1 tablet (10 mg total) by mouth in the morning. 90 tablet 3   losartan  (COZAAR ) 100 MG tablet Take 1 tablet (100 mg total) by mouth daily. 90 tablet 3   pantoprazole  (PROTONIX ) 40 MG tablet Take  1 tablet (40 mg total) by mouth daily. 90 tablet 3   REPATHA  SURECLICK 140 MG/ML SOAJ INJECT 140MG  UNDER THE SKIN EVERY 14 DAYS 2 mL 0   temazepam  (RESTORIL ) 15 MG capsule Take 1 capsule (15 mg total) by mouth at bedtime as needed for sleep. 90 capsule 0   tirzepatide  (MOUNJARO ) 15 MG/0.5ML Pen Inject 15 mg into the skin once a week. 6 mL 1   tirzepatide  (MOUNJARO ) 15 MG/0.5ML Pen Inject 15 mg into the skin once a week. 6 mL 3   No current facility-administered medications for this visit.   Allergies:  Ezetimibe -simvastatin, Percocet [oxycodone -acetaminophen ], Pravachol  [pravastatin  sodium], Simvastatin, Statins, and Zetia  [ezetimibe ]   Social History: The patient  reports that she has quit smoking. Her smoking use included cigarettes. She has a 10 pack-year smoking history. She has never used smokeless tobacco. She reports current alcohol use. She reports that she does not use drugs.   Family History: The patient's family history includes Arthritis in her mother; COPD in her maternal grandfather; Cancer in her brother, father, maternal grandfather, and maternal grandmother; Diabetes in her father; Hypertension in her paternal grandmother; Lung cancer in her father; Stroke in her paternal grandmother.   ROS:  Please see the history of present illness. Otherwise, complete review of systems is positive for none.  All other systems are reviewed and negative.   Physical Exam: VS:  BP 120/78   Pulse 64   Ht 5' 1 (1.549 m)   Wt 174 lb (78.9 kg)   SpO2 98%   BMI 32.88 kg/m , BMI Body mass index is 32.88 kg/m.  Wt Readings from Last 3 Encounters:  05/02/24 174 lb (78.9 kg)  03/13/24 175 lb 1.3 oz (79.4 kg)  11/28/23 174 lb 1.3 oz (79 kg)    General: Patient appears comfortable at rest. HEENT: Conjunctiva and lids normal, oropharynx clear with moist mucosa. Neck: Supple, no elevated JVP or carotid bruits, no thyromegaly. Lungs: Clear to auscultation, nonlabored breathing at rest. Cardiac:  Regular rate and rhythm, Grade 2-3 holosystolic murmur Abdomen: Soft, nontender, no hepatomegaly, bowel sounds present, no guarding or rebound. Extremities: No pitting edema, distal pulses 2+. Skin: Warm and dry. Musculoskeletal: No kyphosis. Neuropsychiatric: Alert and oriented x3, affect grossly appropriate.  ECG:  An ECG dated today was personally reviewed today and demonstrated:  NSR, No ST-T changes  Recent Labwork: 12/06/2023: TSH 2.030 03/13/2024: ALT 17; AST 16; BUN 13; Creatinine, Ser 0.76; Hemoglobin 13.3; Platelets 328; Potassium 4.2; Sodium 138     Component Value Date/Time   CHOL 158 12/06/2023 0811   TRIG 194 (H) 12/06/2023 0811   HDL 55 12/06/2023 0811   CHOLHDL 2.9 12/06/2023 0811   CHOLHDL 3.5 08/24/2023 0955   VLDL 29 08/24/2023 0955   LDLCALC 71 12/06/2023 0811    Other Studies Reviewed Today:  STRESS ECHO in 2011  Stress ECG: There were no stress arrhythmias or conduction   abnormalities. Insignificant upsloping ST segment depression   present. The stress ECG was negative for ischemia.    --------------------------------------------------------------------   Baseline:    - LV size was normal.   - LV global systolic function was normal. The estimated LV ejection     fraction was 55%. The left atrium, mitral and aortic valves, LV     size and LV function are normal. Mild LVH is present.   - Normal wall motion; no LV regional wall motion abnormalities.   Immediate post stress; 2.38mph, 12degrees; ; 3 min:    - LV size was normal and unchanged from baseline.   - LV global systolic function was normal and appropriately augmented     from baseline. The estimated LV ejection fraction was 65%.   - Normal wall motion; no LV regional wall motion abnormalities.   - No evidence for new LV regional wall motion abnormalities.    --------------------------------------------------------------------   Stress echo results: Left ventricular ejection fraction was normal    at rest and with stress. There was no echocardiographic evidence for   stress-induced ischemia.   Assessment and Plan:  Imaging evidence of H advanced coronary calcifications: No angina or DOE.  Discussed symptoms of CAD and MI.  ER precautions for chest pain provided.  Start aspirin  81 mg once daily and continue Repatha .  Goal LDL will be less than 70.  HLD, at goal: Continue Repatha , goal LDL will be less than 70.  HTN, controlled: Continue amlodipine  10 mg once daily and losartan  100 mg once daily.    Medication Adjustments/Labs and Tests Ordered: Current medicines are reviewed at length with the patient today.  Concerns regarding medicines are outlined above.   Tests Ordered: 2D Echo   Medication Changes: No orders of the defined types were placed in this encounter.   Disposition:  Follow up prn  Signed, Rynn Markiewicz Arleta Maywood, MD, 05/02/2024 11:50 AM    [facility] at [facility] [address], [place], [place] [address]

## 2024-05-12 ENCOUNTER — Telehealth: Admitting: Family Medicine

## 2024-05-12 DIAGNOSIS — H60501 Unspecified acute noninfective otitis externa, right ear: Secondary | ICD-10-CM | POA: Diagnosis not present

## 2024-05-12 MED ORDER — NEOMYCIN-POLYMYXIN-HC 3.5-10000-1 OT SUSP: 3.0000 [drp] | Freq: Four times a day (QID) | OTIC | 0 refills | Status: AC

## 2024-05-12 NOTE — Progress Notes (Signed)
 Virtual Visit Consent   Alicia Neal, you are scheduled for a virtual visit with a [facility] provider today. Just as with appointments in the office, your consent must be obtained to participate. Your consent will be active for this visit and any virtual visit you may have with one of our providers in the next 365 days. If you have a MyChart account, a copy of this consent can be sent to you electronically.  As this is a virtual visit, video technology does not allow for your provider to perform a traditional examination. This may limit your provider's ability to fully assess your condition. If your provider identifies any concerns that need to be evaluated in person or the need to arrange testing (such as labs, EKG, etc.), we will make arrangements to do so. Although advances in technology are sophisticated, we cannot ensure that it will always work on either your end or our end. If the connection with a video visit is poor, the visit may have to be switched to a telephone visit. With either a video or telephone visit, we are not always able to ensure that we have a secure connection.  By engaging in this virtual visit, you consent to the provision of healthcare and authorize for your insurance to be billed (if applicable) for the services provided during this visit. Depending on your insurance coverage, you may receive a charge related to this service.  I need to obtain your verbal consent now. Are you willing to proceed with your visit today? Alicia Neal has provided verbal consent on 05/12/2024 for a virtual visit (video or telephone). Loa Lamp, FNP  Date: 05/12/2024 11:17 AM   Virtual Visit via Video Note   I, Loa Lamp, connected with  Alicia Neal  ([id number], Jun 22, 1961) on 05/12/24 at 11:15 AM EDT by a video-enabled telemedicine application and verified that I am speaking with the correct person using two identifiers.  Location: Patient: Virtual Visit Location Patient:  Home Provider: Virtual Visit Location Provider: Home Office   I discussed the limitations of evaluation and management by telemedicine and the availability of in person appointments. The patient expressed understanding and agreed to proceed.    History of Present Illness: Alicia Neal is a 63 y.o. who identifies as a female who was assigned female at birth, and is being seen today for rt ear pain and drainage after [place]. No fever. Pain with manipulation of external ear. SABRA  HPI: HPI  Problems:  Patient Active Problem List   Diagnosis Date Noted   Encounter for Medicare annual wellness exam 03/14/2024   Post-menopause 03/14/2024   Coronary artery calcification 03/14/2024   Dermatomycosis 09/15/2023   Grief at loss of child 06/15/2023   ERRONEOUS ENCOUNTER--DISREGARD 05/09/2023   Closed left ankle fracture 04/01/2023   Syncope and collapse 04/01/2023   Systolic murmur 08/10/2022   At increased risk for cardiovascular disease 08/03/2022   Mildly obese 05/01/2020   Hypertension goal BP (blood pressure) < 130/80 04/17/2018   Abnormal CT scan, chest 03/15/2018   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   GAD (generalized anxiety disorder) 10/25/2015   Vitamin D  deficiency 06/23/2015   Pulmonary nodules 03/18/2014   Insomnia 03/03/2014   Adrenal nodule (HCC) 03/03/2014   H/O abnormal cervical Papanicolaou smear 04/15/2013   B12 deficiency 10/24/2012   GERD (gastroesophageal reflux disease) 10/24/2012   Sleep apnea 04/23/2012   Pemphigoid, 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   HTN, goal below 130/80 01/12/2012  Herpes simplex virus (HSV) infection 08/18/2010   Mixed hyperlipidemia 05/06/2010   Type 2 diabetes mellitus with other specified complication (HCC) 05/02/2010   Hyperlipidemia LDL goal <100 05/02/2010   Allergic rhinitis 05/02/2010   Chest heaviness 04/30/2010    Allergies:  Allergies  Allergen Reactions   Ezetimibe -Simvastatin Other (See Comments)     muscle  aches   Percocet [Oxycodone -Acetaminophen ] Nausea Only   Pravachol  [Pravastatin  Sodium] Other (See Comments)    Joint pains and memory loss   Simvastatin Other (See Comments)    REACTION: muscle aches   Statins    Zetia  [Ezetimibe ] Other (See Comments)    Generalized joint pains   Medications:  Current Outpatient Medications:    amLODipine  (NORVASC ) 10 MG tablet, Take 1 tablet (10 mg total) by mouth every evening., Disp: 90 tablet, Rfl: 3   aspirin  EC 81 MG tablet, Take 1 tablet (81 mg total) by mouth daily. Swallow whole., Disp: 30 tablet, Rfl: 12   citalopram  (CELEXA ) 20 MG tablet, Take 1 tablet (20 mg total) by mouth daily., Disp: 90 tablet, Rfl: 3   clotrimazole -betamethasone  (LOTRISONE ) cream, Apply 1 Application topically daily., Disp: 45 g, Rfl: 1   loratadine  (CLARITIN ) 10 MG tablet, Take 1 tablet (10 mg total) by mouth in the morning., Disp: 90 tablet, Rfl: 3   losartan  (COZAAR ) 100 MG tablet, Take 1 tablet (100 mg total) by mouth daily., Disp: 90 tablet, Rfl: 3   pantoprazole  (PROTONIX ) 40 MG tablet, Take 1 tablet (40 mg total) by mouth daily., Disp: 90 tablet, Rfl: 3   REPATHA  SURECLICK 140 MG/ML SOAJ, INJECT 140MG  UNDER THE SKIN EVERY 14 DAYS, Disp: 2 mL, Rfl: 0   temazepam  (RESTORIL ) 15 MG capsule, Take 1 capsule (15 mg total) by mouth at bedtime as needed for sleep., Disp: 90 capsule, Rfl: 0   tirzepatide  (MOUNJARO ) 15 MG/0.5ML Pen, Inject 15 mg into the skin once a week., Disp: 6 mL, Rfl: 1   tirzepatide  (MOUNJARO ) 15 MG/0.5ML Pen, Inject 15 mg into the skin once a week., Disp: 6 mL, Rfl: 3  Observations/Objective: Patient is well-developed, well-nourished in no acute distress.  Resting comfortably  at home.  Head is normocephalic, atraumatic.  No labored breathing.  Speech is clear and coherent with logical content.  Patient is alert and oriented at baseline.    Assessment and Plan: There are no diagnoses linked to this encounter. Heat, ibuprofen  as directed, UC if  sx worsen. No swimming for 7 days.   Follow Up Instructions: I discussed the assessment and treatment plan with the patient. The patient was provided an opportunity to ask questions and all were answered. The patient agreed with the plan and demonstrated an understanding of the instructions.  A copy of instructions were sent to the patient via MyChart unless otherwise noted below.     The patient was advised to call back or seek an in-person evaluation if the symptoms worsen or if the condition fails to improve as anticipated.    Keshia Weare, FNP

## 2024-05-12 NOTE — Patient Instructions (Signed)
Otitis Externa  Otitis externa is an infection of the outer ear canal. The outer ear canal is the area between the outside of the ear and the eardrum. Otitis externa is sometimes called swimmer's ear. What are the causes? Common causes of this condition include: Swimming in dirty water. Moisture in the ear. An injury to the inside of the ear. An object stuck in the ear. A cut or scrape on the outside of the ear or in the ear canal. What increases the risk? You are more likely to develop this condition if you go swimming often. What are the signs or symptoms? The first symptom of this condition is often itching in the ear. Later symptoms of the condition include: Swelling of the ear. Redness in the ear. Ear pain. The pain may get worse when you pull on your ear. Pus coming from the ear. How is this diagnosed? This condition may be diagnosed by examining the ear and testing fluid from the ear for bacteria and funguses. How is this treated? This condition may be treated with: Antibiotic ear drops. These are often given for 10-14 days. Medicines to reduce itching and swelling. Follow these instructions at home: If you were prescribed antibiotic ear drops, use them as told by your health care provider. Do not stop using the antibiotic even if you start to feel better. Take over-the-counter and prescription medicines only as told by your health care provider. Avoid getting water in your ears as told by your health care provider. This may include avoiding swimming or water sports for a few days. Keep all follow-up visits. This is important. How is this prevented? Keep your ears dry. Use the corner of a towel to dry your ears after you swim or bathe. Avoid scratching or putting things in your ear. Doing these things can damage the ear canal or remove the protective wax that lines it, which makes it easier for bacteria and funguses to grow. Avoid swimming in lakes, polluted water, or swimming  pools that may not have enough chlorine. Contact a health care provider if: You have a fever. Your ear is still red, swollen, painful, or draining pus after 3 days. Your redness, swelling, or pain gets worse. You have a severe headache. Get help right away if: You have redness, swelling, and pain or tenderness in the area behind your ear. Summary Otitis externa is an infection of the outer ear canal. Common causes include swimming in dirty water, moisture in the ear, or a cut or scrape in the ear. Symptoms include pain, redness, and swelling of the ear canal. If you were prescribed antibiotic ear drops, use them as told by your health care provider. Do not stop using the antibiotic even if you start to feel better. This information is not intended to replace advice given to you by your health care provider. Make sure you discuss any questions you have with your health care provider. Document Revised: 01/06/2021 Document Reviewed: 01/06/2021 Elsevier Patient Education  2024 Elsevier Inc.  

## 2024-05-15 ENCOUNTER — Telehealth: Payer: Self-pay | Admitting: Family Medicine

## 2024-05-15 MED ORDER — CITALOPRAM HYDROBROMIDE 20 MG PO TABS: 20.0000 mg | ORAL_TABLET | Freq: Every day | ORAL | 3 refills | Status: DC

## 2024-05-15 MED ORDER — PANTOPRAZOLE SODIUM 40 MG PO TBEC: 40.0000 mg | DELAYED_RELEASE_TABLET | Freq: Every day | ORAL | 3 refills | Status: DC

## 2024-05-15 NOTE — Telephone Encounter (Signed)
 Copied from CRM [id number]. Topic: Clinical - Medication Refill >> May 15, 2024 11:44 AM Carlatta H wrote: Medication: citalopram  (CELEXA ) 20 MG tablet pantoprazole  (PROTONIX ) 40 MG tablet  Has the patient contacted their pharmacy? No (Agent: If no, request that the patient contact the pharmacy for the refill. If patient does not wish to contact the pharmacy document the reason why and proceed with request.) (Agent: If yes, when and what did the pharmacy advise?)  This is the patient's preferred pharmacy:  [facility] - [place], [place] - [address] [place] [address] Phone: [phone] Fax: [phone]    Is this the correct pharmacy for this prescription? Yes If no, delete pharmacy and type the correct one.   Has the prescription been filled recently? No  Is the patient out of the medication? Yes  Has the patient been seen for an appointment in the last year OR does the patient have an upcoming appointment? Yes  Can we respond through MyChart? Yes  Agent: Please be advised that Rx refills may take up to 3 business days. We ask that you follow-up with your pharmacy.

## 2024-05-18 ENCOUNTER — Other Ambulatory Visit: Payer: Self-pay | Admitting: Family Medicine

## 2024-05-29 ENCOUNTER — Other Ambulatory Visit: Payer: Self-pay | Admitting: Family Medicine

## 2024-05-29 NOTE — Telephone Encounter (Unsigned)
 Copied from CRM #[id number]. Topic: Clinical - Medication Refill >> May 29, 2024  2:19 PM Santiya F wrote: Medication: losartan  (COZAAR ) 100 MG tablet [[id number]]  Has the patient contacted their pharmacy? Yes  (Agent: If yes, when and what did the pharmacy advise?) contact office   This is the patient's preferred pharmacy:  [facility] - [place], [place] - [address] [place] [address] Phone: [phone] Fax: [phone]   Is this the correct pharmacy for this prescription? Yes If no, delete pharmacy and type the correct one.   Has the prescription been filled recently? Yes  Is the patient out of the medication? No  Has the patient been seen for an appointment in the last year OR does the patient have an upcoming appointment? Yes  Can we respond through MyChart? Yes  Agent: Please be advised that Rx refills may take up to 3 business days. We ask that you follow-up with your pharmacy.

## 2024-05-30 MED ORDER — LOSARTAN POTASSIUM 100 MG PO TABS: 100.0000 mg | ORAL_TABLET | Freq: Every day | ORAL | 3 refills | Status: DC

## 2024-06-11 DIAGNOSIS — H2512 Age-related nuclear cataract, left eye: Secondary | ICD-10-CM | POA: Diagnosis not present

## 2024-06-14 DIAGNOSIS — H25812 Combined forms of age-related cataract, left eye: Secondary | ICD-10-CM | POA: Diagnosis not present

## 2024-07-09 ENCOUNTER — Telehealth: Payer: Self-pay | Admitting: Family Medicine

## 2024-07-09 DIAGNOSIS — K080 Exfoliation of teeth due to systemic causes: Secondary | ICD-10-CM | POA: Diagnosis not present

## 2024-07-09 NOTE — Telephone Encounter (Signed)
Patient put on wait list.

## 2024-07-09 NOTE — Telephone Encounter (Signed)
 Copied from CRM [id number]. Topic: Appointments - Appointment Info/Confirmation >> Jul 09, 2024  2:55 PM Tiffany S wrote: Patient/patient representative is calling for information regarding an appointment.   Patient was told to reschedule her appt nothing available until 11/15/24 please follow up with patient if there are any sooner appts

## 2024-07-30 ENCOUNTER — Ambulatory Visit: Admitting: Family Medicine

## 2024-08-14 ENCOUNTER — Telehealth: Payer: Self-pay

## 2024-08-14 NOTE — Telephone Encounter (Signed)
 Copied from CRM #[id number]. Topic: Clinical - Medication Question >> Aug 14, 2024  3:42 PM Harlene ORN wrote: Reason for CRM: Patient called requesting a refill of her REPATHA . Her [facility] has refused her refill request. Patient realized she previously asked to put a hold on it, so she will call back the pharmacy and discuss that with them and will call back tomorrow if she's still having trouble getting the medication

## 2024-08-14 NOTE — Telephone Encounter (Signed)
 Noted

## 2024-09-17 ENCOUNTER — Ambulatory Visit (INDEPENDENT_AMBULATORY_CARE_PROVIDER_SITE_OTHER): Admitting: Family Medicine

## 2024-09-17 ENCOUNTER — Encounter: Payer: Self-pay | Admitting: Family Medicine

## 2024-09-17 VITALS — BP 128/79 | HR 71 | Resp 18 | Ht 61.5 in | Wt 176.0 lb

## 2024-09-17 DIAGNOSIS — D126 Benign neoplasm of colon, unspecified: Secondary | ICD-10-CM

## 2024-09-17 DIAGNOSIS — Z0000 Encounter for general adult medical examination without abnormal findings: Secondary | ICD-10-CM

## 2024-09-17 DIAGNOSIS — I10 Essential (primary) hypertension: Secondary | ICD-10-CM

## 2024-09-17 DIAGNOSIS — Z0001 Encounter for general adult medical examination with abnormal findings: Secondary | ICD-10-CM

## 2024-09-17 DIAGNOSIS — E1169 Type 2 diabetes mellitus with other specified complication: Secondary | ICD-10-CM

## 2024-09-17 DIAGNOSIS — F5101 Primary insomnia: Secondary | ICD-10-CM | POA: Diagnosis not present

## 2024-09-17 DIAGNOSIS — E785 Hyperlipidemia, unspecified: Secondary | ICD-10-CM

## 2024-09-17 DIAGNOSIS — Z800 Family history of malignant neoplasm of digestive organs: Secondary | ICD-10-CM

## 2024-09-17 DIAGNOSIS — E669 Obesity, unspecified: Secondary | ICD-10-CM

## 2024-09-17 MED ORDER — TEMAZEPAM 15 MG PO CAPS: 15.0000 mg | ORAL_CAPSULE | Freq: Every day | ORAL | 1 refills | Status: AC

## 2024-09-17 NOTE — Patient Instructions (Addendum)
 F/U in 18 weeks  Urine aCR today  Please schedule dexa this has already been ordered, needs to be scheduled  Nurse pls enter no retinopathy diabetic eye exam done 03/12/2024  Fasting lipid, cmp and eGFr and hBa1C on same day as dexa  You are referred for colonoscopy due 12/2024, you should get a call from the office  Medication sent to mail order for sleep  Thanks for choosing [facility], we consider it a privelige to serve you.

## 2024-09-17 NOTE — Progress Notes (Unsigned)
 Alicia Neal     MRN: [id number]      DOB: 05/19/1961  Chief Complaint  Patient presents with   Insomnia    Pt complains of severe insomnia x2 months    Annual Exam    HPI: Patient is in for annual physical exam. 2 month h/o severe insomnia, no sp[ecific trigger, was maintained for years on medication , this is a family issue, has been off meds for approximately 5 months Immunization is reviewed , and  updated if needed.   PE: BP 128/79   Pulse 71   Resp 18   Ht 5' 1.5 (1.562 m)   Wt 176 lb 0.6 oz (79.9 kg)   SpO2 97%   BMI 32.72 kg/m   Pleasant  female, alert and oriented x 3, in no cardio-pulmonary distress. Afebrile. HEENT No facial trauma or asymetry. Sinuses non tender.  Extra occullar muscles intact.. External ears normal, . Neck: supple, no adenopathy,JVD or thyromegaly.No bruits.  Chest: Clear to ascultation bilaterally.No crackles or wheezes. Non tender to palpation  Cardiovascular system; Heart sounds normal,  S1 and  S2 ,no S3.  No murmur, or thrill. Apical beat not displaced Peripheral pulses normal.    Musculoskeletal exam: Full ROM of spine, hips , shoulders and knees. No deformity ,swelling or crepitus noted. No muscle wasting or atrophy.   Neurologic: Cranial nerves 2 to 12 intact. Power, tone ,sensation and reflexes normal throughout. No disturbance in gait. No tremor.  Skin: Intact, no ulceration, erythema , scaling or rash noted. Pigmentation normal throughout  Psych; Normal mood and affect. Judgement and concentration normal   Assessment & Plan:  Encounter for annual physical exam Annual exam as documented. Counseling done  re healthy lifestyle involving commitment to 150 minutes exercise per week, heart healthy diet, and attaining healthy weight.The importance of adequate sleep also discussed. Changes in health habits are decided on by the patient with goals and time frames  set for achieving them. Immunization and  cancer screening needs are specifically addressed at this visit.   Insomnia Uncontrolled and severe off medication , resume same Sleep hygiene reviewed and written information offered also. Prescription sent for  medication needed.   Colon adenoma Rept colonoscopy due 12/2024, referral sent, denies abdominal pauin or change in stool caliber  FH: colon cancer Screening colonoscopy due 12/2024, referral sent  Type 2 diabetes mellitus with other specified complication (HCC) Diabetes associated with hypertension and hyperlipidemia  Alicia Neal is reminded of the importance of commitment to daily physical activity for 30 minutes or more, as able and the need to limit carbohydrate intake to 30 to 60 grams per meal to help with blood sugar control.   The need to take medication as prescribed, test blood sugar as directed, and to call between visits if there is a concern that blood sugar is uncontrolled is also discussed.   Alicia Neal is reminded of the importance of daily foot exam, annual eye examination, and good blood sugar, blood pressure and cholesterol control.     Latest Ref Rng & Units 03/13/2024    4:46 PM 12/06/2023    8:11 AM 08/24/2023    9:56 AM 08/24/2023    9:55 AM 04/07/2023   11:17 AM  Diabetic Labs  HbA1c 4.8 - 5.6 % 7.0  7.4  7.2   6.7   Microalbumin Not Estab. ug/mL    <3.0    Micro/Creat Ratio 0 - 29 mg/g creat    <10  Chol 100 - 199 mg/dL  841   783    HDL >60 mg/dL  55   62    Calc LDL 0 - 99 mg/dL  71   874    Triglycerides 0 - 149 mg/dL  805   854    Creatinine 0.57 - 1.00 mg/dL 9.23  9.19  9.27         09/17/2024    3:03 PM 05/02/2024   11:29 AM 03/13/2024    3:31 PM 11/28/2023    4:06 PM 08/29/2023    4:05 PM 07/03/2023    9:39 AM 06/15/2023    8:44 AM  BP/Weight  Systolic BP 128 120 109 128 123 134 113  Diastolic BP 79 78 64 85 72 80 71  Wt. (Lbs) 176.04 174 175.08 174.08 177.04 173.6 174.12  BMI 32.72 kg/m2 32.88 kg/m2 32.55 kg/m2 31.84 kg/m2 32.38  kg/m2 32.8 kg/m2 31.85 kg/m2      Latest Ref Rng & Units 03/08/2023   12:00 AM 08/03/2022    8:20 AM  Foot/eye exam completion dates  Eye Exam No Retinopathy No Retinopathy       Foot Form Completion   Done     This result is from an external source.      Updated lab needed at/ before next visit.   Hyperlipidemia LDL goal <100 Hyperlipidemia:Low fat diet discussed and encouraged.   Lipid Panel  Lab Results  Component Value Date   CHOL 158 12/06/2023   HDL 55 12/06/2023   LDLCALC 71 12/06/2023   TRIG 194 (H) 12/06/2023   CHOLHDL 2.9 12/06/2023     Updated lab needed at/ before next visit.   HTN, goal below 130/80 DASH diet and commitment to daily physical activity for a minimum of 30 minutes discussed and encouraged, as a part of hypertension management. The importance of attaining a healthy weight is also discussed.     09/17/2024    3:03 PM 05/02/2024   11:29 AM 03/13/2024    3:31 PM 11/28/2023    4:06 PM 08/29/2023    4:05 PM 07/03/2023    9:39 AM 06/15/2023    8:44 AM  BP/Weight  Systolic BP 128 120 109 128 123 134 113  Diastolic BP 79 78 64 85 72 80 71  Wt. (Lbs) 176.04 174 175.08 174.08 177.04 173.6 174.12  BMI 32.72 kg/m2 32.88 kg/m2 32.55 kg/m2 31.84 kg/m2 32.38 kg/m2 32.8 kg/m2 31.85 kg/m2     Controlled, no change in medication   Mildly obese  Patient re-educated about  the importance of commitment to a  minimum of 150 minutes of exercise per week as able.  The importance of healthy food choices with portion control discussed, as well as eating regularly and within a 12 hour window most days. The need to choose clean , green food 50 to 75% of the time is discussed, as well as to make water the primary drink and set a goal of 64 ounces water daily.       09/17/2024    3:03 PM 05/02/2024   11:29 AM 03/13/2024    3:31 PM  Weight /BMI  Weight 176 lb 0.6 oz 174 lb 175 lb 1.3 oz  Height 5' 1.5 (1.562 m) 5' 1 (1.549 m) 5' 1.5 (1.562 m)  BMI 32.72  kg/m2 32.88 kg/m2 32.55 kg/m2    unchanged

## 2024-09-18 ENCOUNTER — Other Ambulatory Visit: Payer: Self-pay

## 2024-09-18 ENCOUNTER — Encounter: Payer: Self-pay | Admitting: Family Medicine

## 2024-09-18 DIAGNOSIS — E1169 Type 2 diabetes mellitus with other specified complication: Secondary | ICD-10-CM

## 2024-09-18 DIAGNOSIS — E785 Hyperlipidemia, unspecified: Secondary | ICD-10-CM

## 2024-09-18 DIAGNOSIS — Z0000 Encounter for general adult medical examination without abnormal findings: Secondary | ICD-10-CM | POA: Insufficient documentation

## 2024-09-18 DIAGNOSIS — I10 Essential (primary) hypertension: Secondary | ICD-10-CM

## 2024-09-18 DIAGNOSIS — D126 Benign neoplasm of colon, unspecified: Secondary | ICD-10-CM | POA: Insufficient documentation

## 2024-09-18 DIAGNOSIS — Z800 Family history of malignant neoplasm of digestive organs: Secondary | ICD-10-CM | POA: Insufficient documentation

## 2024-09-18 NOTE — Assessment & Plan Note (Addendum)
 Diabetes associated with hypertension and hyperlipidemia  Ms. Lobb is reminded of the importance of commitment to daily physical activity for 30 minutes or more, as able and the need to limit carbohydrate intake to 30 to 60 grams per meal to help with blood sugar control.   The need to take medication as prescribed, test blood sugar as directed, and to call between visits if there is a concern that blood sugar is uncontrolled is also discussed.   Ms. Novakowski is reminded of the importance of daily foot exam, annual eye examination, and good blood sugar, blood pressure and cholesterol control.     Latest Ref Rng & Units 03/13/2024    4:46 PM 12/06/2023    8:11 AM 08/24/2023    9:56 AM 08/24/2023    9:55 AM 04/07/2023   11:17 AM  Diabetic Labs  HbA1c 4.8 - 5.6 % 7.0  7.4  7.2   6.7   Microalbumin Not Estab. ug/mL    <3.0    Micro/Creat Ratio 0 - 29 mg/g creat    <10    Chol 100 - 199 mg/dL  841   783    HDL >60 mg/dL  55   62    Calc LDL 0 - 99 mg/dL  71   874    Triglycerides 0 - 149 mg/dL  805   854    Creatinine 0.57 - 1.00 mg/dL 9.23  9.19  9.27         09/17/2024    3:03 PM 05/02/2024   11:29 AM 03/13/2024    3:31 PM 11/28/2023    4:06 PM 08/29/2023    4:05 PM 07/03/2023    9:39 AM 06/15/2023    8:44 AM  BP/Weight  Systolic BP 128 120 109 128 123 134 113  Diastolic BP 79 78 64 85 72 80 71  Wt. (Lbs) 176.04 174 175.08 174.08 177.04 173.6 174.12  BMI 32.72 kg/m2 32.88 kg/m2 32.55 kg/m2 31.84 kg/m2 32.38 kg/m2 32.8 kg/m2 31.85 kg/m2      Latest Ref Rng & Units 03/08/2023   12:00 AM 08/03/2022    8:20 AM  Foot/eye exam completion dates  Eye Exam No Retinopathy No Retinopathy       Foot Form Completion   Done     This result is from an external source.      Updated lab needed at/ before next visit.

## 2024-09-18 NOTE — Assessment & Plan Note (Signed)
 Rept colonoscopy due 12/2024, referral sent, denies abdominal pauin or change in stool caliber

## 2024-09-18 NOTE — Assessment & Plan Note (Signed)
 DASH diet and commitment to daily physical activity for a minimum of 30 minutes discussed and encouraged, as a part of hypertension management. The importance of attaining a healthy weight is also discussed.     09/17/2024    3:03 PM 05/02/2024   11:29 AM 03/13/2024    3:31 PM 11/28/2023    4:06 PM 08/29/2023    4:05 PM 07/03/2023    9:39 AM 06/15/2023    8:44 AM  BP/Weight  Systolic BP 128 120 109 128 123 134 113  Diastolic BP 79 78 64 85 72 80 71  Wt. (Lbs) 176.04 174 175.08 174.08 177.04 173.6 174.12  BMI 32.72 kg/m2 32.88 kg/m2 32.55 kg/m2 31.84 kg/m2 32.38 kg/m2 32.8 kg/m2 31.85 kg/m2     Controlled, no change in medication

## 2024-09-18 NOTE — Assessment & Plan Note (Signed)
Annual exam as documented. Counseling done  re healthy lifestyle involving commitment to 150 minutes exercise per week, heart healthy diet, and attaining healthy weight.The importance of adequate sleep also discussed. Changes in health habits are decided on by the patient with goals and time frames  set for achieving them. Immunization and cancer screening needs are specifically addressed at this visit. 

## 2024-09-18 NOTE — Assessment & Plan Note (Signed)
 Screening colonoscopy due 12/2024, referral sent

## 2024-09-18 NOTE — Assessment & Plan Note (Signed)
 Hyperlipidemia:Low fat diet discussed and encouraged.   Lipid Panel  Lab Results  Component Value Date   CHOL 158 12/06/2023   HDL 55 12/06/2023   LDLCALC 71 12/06/2023   TRIG 194 (H) 12/06/2023   CHOLHDL 2.9 12/06/2023     Updated lab needed at/ before next visit.

## 2024-09-18 NOTE — Assessment & Plan Note (Signed)
 Uncontrolled and severe off medication , resume same Sleep hygiene reviewed and written information offered also. Prescription sent for  medication needed.

## 2024-09-18 NOTE — Assessment & Plan Note (Signed)
  Patient re-educated about  the importance of commitment to a  minimum of 150 minutes of exercise per week as able.  The importance of healthy food choices with portion control discussed, as well as eating regularly and within a 12 hour window most days. The need to choose clean , green food 50 to 75% of the time is discussed, as well as to make water the primary drink and set a goal of 64 ounces water daily.       09/17/2024    3:03 PM 05/02/2024   11:29 AM 03/13/2024    3:31 PM  Weight /BMI  Weight 176 lb 0.6 oz 174 lb 175 lb 1.3 oz  Height 5' 1.5 (1.562 m) 5' 1 (1.549 m) 5' 1.5 (1.562 m)  BMI 32.72 kg/m2 32.88 kg/m2 32.55 kg/m2    unchanged

## 2024-09-24 ENCOUNTER — Telehealth: Payer: Self-pay

## 2024-09-24 NOTE — Telephone Encounter (Signed)
 Copied from CRM [id number]. Topic: Appointments - Scheduling Inquiry for Clinic >> Sep 24, 2024 12:59 PM Harlene ORN wrote: Reason for CRM: Patient called to schedule a bone density test. Prefers to be seen early in the morning. Please call back to schedule.

## 2024-09-24 NOTE — Telephone Encounter (Signed)
 Scheduled for 09/26/2024

## 2024-09-26 ENCOUNTER — Other Ambulatory Visit (HOSPITAL_COMMUNITY)

## 2024-09-26 ENCOUNTER — Ambulatory Visit (HOSPITAL_COMMUNITY)
Admission: RE | Admit: 2024-09-26 | Discharge: 2024-09-26 | Disposition: A | Discharge status: Home / Self Care | Source: Ambulatory Visit | Attending: Family Medicine | Admitting: Family Medicine

## 2024-09-26 ENCOUNTER — Ambulatory Visit: Payer: Self-pay | Admitting: Family Medicine

## 2024-09-26 DIAGNOSIS — I10 Essential (primary) hypertension: Secondary | ICD-10-CM | POA: Diagnosis not present

## 2024-09-26 DIAGNOSIS — Z780 Asymptomatic menopausal state: Secondary | ICD-10-CM | POA: Insufficient documentation

## 2024-09-26 DIAGNOSIS — E785 Hyperlipidemia, unspecified: Secondary | ICD-10-CM | POA: Diagnosis not present

## 2024-09-26 DIAGNOSIS — M85852 Other specified disorders of bone density and structure, left thigh: Secondary | ICD-10-CM | POA: Diagnosis not present

## 2024-09-26 DIAGNOSIS — M85851 Other specified disorders of bone density and structure, right thigh: Secondary | ICD-10-CM | POA: Diagnosis not present

## 2024-09-26 DIAGNOSIS — E1169 Type 2 diabetes mellitus with other specified complication: Secondary | ICD-10-CM | POA: Diagnosis not present

## 2024-09-27 ENCOUNTER — Other Ambulatory Visit: Payer: Self-pay | Admitting: Family Medicine

## 2024-09-27 ENCOUNTER — Ambulatory Visit: Payer: Self-pay | Admitting: Family Medicine

## 2024-09-27 ENCOUNTER — Other Ambulatory Visit: Payer: Self-pay

## 2024-09-27 DIAGNOSIS — R040 Epistaxis: Secondary | ICD-10-CM

## 2024-09-27 DIAGNOSIS — K219 Gastro-esophageal reflux disease without esophagitis: Secondary | ICD-10-CM

## 2024-09-27 DIAGNOSIS — E1169 Type 2 diabetes mellitus with other specified complication: Secondary | ICD-10-CM

## 2024-09-28 LAB — CMP14+EGFR
ALT: 20 IU/L (ref 0–32)
AST: 19 IU/L (ref 0–40)
Albumin: 4.7 g/dL (ref 3.9–4.9)
Alkaline Phosphatase: 96 IU/L (ref 49–135)
BUN/Creatinine Ratio: 14 (ref 12–28)
BUN: 13 mg/dL (ref 8–27)
Bilirubin Total: 0.4 mg/dL (ref 0.0–1.2)
CO2: 23 mmol/L (ref 20–29)
Calcium: 9.4 mg/dL (ref 8.7–10.3)
Chloride: 97 mmol/L (ref 96–106)
Creatinine, Ser: 0.92 mg/dL (ref 0.57–1.00)
Globulin, Total: 2.5 g/dL (ref 1.5–4.5)
Glucose: 169 mg/dL — ABNORMAL HIGH (ref 70–99)
Potassium: 4.4 mmol/L (ref 3.5–5.2)
Sodium: 135 mmol/L (ref 134–144)
Total Protein: 7.2 g/dL (ref 6.0–8.5)
eGFR: 69 mL/min/1.73 (ref >59)

## 2024-09-28 LAB — LIPID PANEL
Chol/HDL Ratio: 2.5 ratio (ref 0.0–4.4)
Cholesterol, Total: 148 mg/dL (ref 100–199)
HDL: 60 mg/dL (ref >39)
LDL Chol Calc (NIH): 65 mg/dL (ref 0–99)
Triglycerides: 132 mg/dL (ref 0–149)
VLDL Cholesterol Cal: 23 mg/dL (ref 5–40)

## 2024-09-28 LAB — MICROALBUMIN / CREATININE URINE RATIO
Creatinine, Urine: 297.9 mg/dL
Microalb/Creat Ratio: 6 mg/g{creat} (ref 0–29)
Microalbumin, Urine: 16.6 ug/mL

## 2024-09-28 LAB — HEMOGLOBIN A1C
Est. average glucose Bld gHb Est-mCnc: 157 mg/dL
Hgb A1c MFr Bld: 7.1 % — ABNORMAL HIGH (ref 4.8–5.6)

## 2024-10-09 DIAGNOSIS — K080 Exfoliation of teeth due to systemic causes: Secondary | ICD-10-CM | POA: Diagnosis not present

## 2024-10-10 DIAGNOSIS — H35341 Macular cyst, hole, or pseudohole, right eye: Secondary | ICD-10-CM | POA: Diagnosis not present

## 2024-10-10 DIAGNOSIS — H43392 Other vitreous opacities, left eye: Secondary | ICD-10-CM | POA: Diagnosis not present

## 2024-10-10 DIAGNOSIS — H35371 Puckering of macula, right eye: Secondary | ICD-10-CM | POA: Diagnosis not present

## 2024-10-10 DIAGNOSIS — H33103 Unspecified retinoschisis, bilateral: Secondary | ICD-10-CM | POA: Diagnosis not present

## 2024-10-14 ENCOUNTER — Encounter (INDEPENDENT_AMBULATORY_CARE_PROVIDER_SITE_OTHER): Payer: Self-pay | Admitting: Physician Assistant

## 2024-10-14 ENCOUNTER — Ambulatory Visit (INDEPENDENT_AMBULATORY_CARE_PROVIDER_SITE_OTHER): Admitting: Physician Assistant

## 2024-10-14 VITALS — BP 114/72 | HR 71 | Temp 97.9°F | Ht 61.5 in | Wt 174.0 lb

## 2024-10-14 DIAGNOSIS — R040 Epistaxis: Secondary | ICD-10-CM

## 2024-10-14 DIAGNOSIS — J3089 Other allergic rhinitis: Secondary | ICD-10-CM

## 2024-10-15 ENCOUNTER — Other Ambulatory Visit: Payer: Self-pay | Admitting: Family Medicine

## 2024-10-15 NOTE — Progress Notes (Addendum)
 Dear Dr. Antonetta, Here is my assessment for our mutual patient, Alicia Neal. Thank you for allowing me the opportunity to care for your patient. Please do not hesitate to contact me should you have any other questions. Sincerely, Chyrl Cohen PA-C  Otolaryngology Clinic Note Referring provider: Dr. Antonetta HPI:  Alicia Neal is a 63 y.o. female kindly referred by Dr. Antonetta   Discussed the use of AI scribe software for clinical note transcription with the patient, who gave verbal consent to proceed.  History of Present Illness    Alicia Neal is a 63 year old female with chronic nasal allergies who presents with recurrent right-sided nasal drainage and epistaxis.  She has a history of severe allergies to cats, dogs, and dust, managed with daily Claritin  and occasional nasal sprays like Flonase  and azelastine , though she finds them ineffective. Benadryl is used during severe allergy attacks, but only three to four times a year due to its side effects.  Approximately two months ago, she experienced sudden clear nasal drainage from the right nostril while at a restaurant, followed by a significant epistaxis lasting about an hour, during which she coughed up blood clots. Since then, she has had multiple episodes of right-sided nasal bleeding, with the second episode occurring within two weeks of the first, lasting about 30 minutes, and subsequent episodes being shorter in duration. The most recent episode occurred yesterday, with minor bleeding noted this morning. She describes the nasal discharge as thick and sticky, sometimes blood-tinged, but not consistently so.  No fever, significant nasal pressure, or persistent sinus pain, although she occasionally experiences transient dental pain. She has no history of epistaxis prior to these events and is not on any blood thinners or aspirin .  She reports a long-standing loss of smell, which she manages by consuming spicy foods to enhance  taste.         Independent Review of Additional Tests or Records:  none   PMH/Meds/All/SocHx/FamHx/ROS:   Past Medical History:  Diagnosis Date   Adrenal adenoma    right side   Allergic rhinitis    Allergy    cat,dog,animal dander   Anxiety disorder    Arrhythmia August 2023   Looks like PVCs   Carpal tunnel syndrome of left wrist    had surgery to repair   Chronic depression    Dr. 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   PONV (postoperative nausea and vomiting)    Pulmonary nodule, right pulmologist-  dr vicenta mcquaid   x2    per CT 09-11-2015   Trigger finger, left    ring finger   Type 2 diabetes mellitus (HCC)    Uncontrolled type 2 diabetes mellitus with hyperglycemia (HCC) 04/17/2018   Wears glasses      Past Surgical History:  Procedure Laterality Date   CARDIOVASCULAR STRESS TEST  04-30-2003   Small focus of decreased perfusion on rest images in the mid-distal anterior wall (worrisome for ischemia),  normal LV function and wall motion , ef 60%   CARPAL TUNNEL RELEASE Left 11/26/2015   Procedure: CARPAL TUNNEL RELEASE;  Surgeon: Prentice Pagan, MD;  Location: [facility];  Service: Orthopedics;  Laterality:  Left;   CHOLECYSTECTOMY  1989   COLONOSCOPY  11/04/2009   ORIF ANKLE FRACTURE Left 03/23/2023   Procedure: OPEN REDUCTION INTERNAL FIXATION (ORIF) ANKLE FRACTURE;  Surgeon: Onesimo Oneil LABOR, MD;  Location: [facility];  Service: Orthopedics;  Laterality: Left;   POLYPECTOMY     TRANSTHORACIC ECHOCARDIOGRAM  04-30-2003   mild hypokinetic in the inferior and posterior wall, ef 50%/  trivial MR /  mild TR   TRIGGER FINGER RELEASE Left 11/26/2015   Procedure: RELEASE TRIGGER FINGER/A-1 PULLEY LEFT  RING  FINGER;  Surgeon: Prentice Pagan, MD;  Location: [facility];  Service: Orthopedics;  Laterality: Left;   TUBAL LIGATION  2000    Family History  Problem Relation Age of Onset   Hypertension Paternal Grandmother    Stroke Paternal Grandmother    Cancer Maternal Grandmother        colon and vaginal   Cancer Maternal Grandfather        colon   COPD Maternal Grandfather    Lung cancer Father    Diabetes Father    Cancer Father        lung   Arthritis Mother    Cancer Brother        skin   Colon cancer Neg Hx    Colon polyps Neg Hx    Esophageal cancer Neg Hx    Rectal cancer Neg Hx    Stomach cancer Neg Hx      Social Connections: Moderately Integrated (03/13/2024)   Social Connection and Isolation Panel    Frequency of Communication with Friends and Family: More than three times a week    Frequency of Social Gatherings with Friends and Family: Twice a week    Attends Religious Services: More than 4 times per year    Active Member of [employer] or Organizations: Yes    Attends Banker Meetings: 1 to 4 times per year    Marital Status: Widowed      Current Outpatient Medications:    amLODipine  (NORVASC ) 10 MG tablet, Take 1 tablet (10 mg total) by mouth every evening., Disp: 90 tablet, Rfl: 3   aspirin  EC 81 MG tablet, Take 1 tablet (81 mg total) by mouth daily. Swallow whole., Disp: 30 tablet, Rfl: 12   citalopram  (CELEXA ) 20 MG tablet, Take 1 tablet (20 mg total) by mouth daily., Disp: 90 tablet, Rfl: 3   clotrimazole -betamethasone  (LOTRISONE ) cream, Apply 1 Application topically daily., Disp: 45 g, Rfl: 1   loratadine  (CLARITIN ) 10 MG tablet, Take 1 tablet (10 mg total) by mouth in the morning., Disp: 90 tablet, Rfl: 3   losartan  (COZAAR ) 100 MG tablet, Take 1 tablet (100 mg total) by mouth daily., Disp: 90 tablet, Rfl: 3   pantoprazole  (PROTONIX ) 40 MG tablet, Take 1 tablet (40 mg total) by mouth daily., Disp: 90 tablet, Rfl: 3   REPATHA  SURECLICK 140 MG/ML SOAJ,  INJECT 140MG  UNDER THE SKIN EVERY 14 DAYS, Disp: 2 mL, Rfl: 0   temazepam  (RESTORIL ) 15 MG capsule, Take 1 capsule (15 mg total) by mouth at bedtime., Disp: 90 capsule, Rfl: 1   tirzepatide  (MOUNJARO ) 15 MG/0.5ML Pen, Inject 15 mg into the skin once a week., Disp: 6 mL, Rfl: 1   tirzepatide  (MOUNJARO ) 15 MG/0.5ML Pen, Inject 15 mg into the skin once a week., Disp: 6 mL, Rfl: 3   Physical Exam:   BP 114/72   Pulse 71   Temp 97.9 F (36.6 C)   Ht 5' 1.5 (1.562 m)  Wt 174 lb (78.9 kg)   SpO2 97%   BMI 32.34 kg/m   Pertinent Findings  CN II-XII grossly intact Bilateral EAC clear and TM intact with well pneumatized middle ear spaces Anterior rhinoscopy: Septum midline; bilateral inferior turbinates with moderate hypertrophy, pale, clear drainage No lesions of oral cavity/oropharynx; dentition wnl No obviously palpable neck masses/lymphadenopathy/thyromegaly No respiratory distress or stridor   Seprately Identifiable Procedures:  PROCEDURE: Bilateral Diagnostic Nasal Endoscopy Pre-procedure diagnosis: Concern for epistaxis Post-procedure diagnosis: same Indication: See pre-procedure diagnosis and physical exam above Complications: None apparent EBL: 0 mL Anesthesia: Lidocaine  4% and topical decongestant was topically sprayed in each nasal cavity  Description of Procedure:  Patient was identified. A rigid 30 degree endoscope was utilized to evaluate the sinonasal cavities, mucosa, sinus ostia and turbinates and septum.  Overall, signs of mucosal inflammation are noted.  Also noted is an area of irritation in the right inferior meatus,no lesions .  No mucopurulence, polyps, or masses noted.   Right Middle meatus: clear Right SE Recess: clear Left MM: clear Left SE Recess: clear Photodocumentation was obtained.  CPT CODE -- 68768 - Mod 25   Impression & Plans:  Alicia Neal is a 63 y.o. female with the following   Assessment and Plan    Recurrent right-sided epistaxis  -  New onset recurrent right epistaxis.  She does have some area of inflammation along the right inferior meatus, no suspicious lesions.  She does have copious clear secretions.  She is constantly aggressively rubbing her nose throughout the evaluation.  Question some mechanical trauma.  I would recommend daily saline sprays, Vaseline at the naris, and humidification at home.  She will continue her antihistamine therapy.  I would like to see her back in the office in 1 month for repeat evaluation, sooner if she has persistent bleeding.  If the epistaxis is better controlled I will consider adding Flonase .  It sounds like she has attempted treatment of the allergies in the past with no success, consider referral to allergy specialist for further management.  The patient verbalized understanding and agreement to today's plan had no further questions or concerns.     - f/u 1 month    Thank you for allowing me the opportunity to care for your patient. Please do not hesitate to contact me should you have any other questions.  Sincerely, Chyrl Cohen PA-C [facility] Phone: [phone] Fax: [phone]  10/15/2024, 10:15 AM

## 2024-10-16 DIAGNOSIS — K080 Exfoliation of teeth due to systemic causes: Secondary | ICD-10-CM | POA: Diagnosis not present

## 2024-10-17 ENCOUNTER — Other Ambulatory Visit: Payer: Self-pay

## 2024-10-17 ENCOUNTER — Encounter: Payer: Self-pay | Admitting: Family Medicine

## 2024-10-17 ENCOUNTER — Telehealth: Payer: Self-pay

## 2024-10-17 DIAGNOSIS — I10 Essential (primary) hypertension: Secondary | ICD-10-CM

## 2024-10-17 MED ORDER — REPATHA SURECLICK 140 MG/ML ~~LOC~~ SOAJ: 140.0000 mg | SUBCUTANEOUS | 0 refills | Status: DC

## 2024-10-17 MED ORDER — CITALOPRAM HYDROBROMIDE 20 MG PO TABS: 20.0000 mg | ORAL_TABLET | Freq: Every day | ORAL | 3 refills | Status: AC

## 2024-10-17 MED ORDER — PANTOPRAZOLE SODIUM 40 MG PO TBEC: 40.0000 mg | DELAYED_RELEASE_TABLET | Freq: Every day | ORAL | 3 refills | Status: AC

## 2024-10-17 MED ORDER — TIRZEPATIDE 15 MG/0.5ML ~~LOC~~ SOAJ: 15.0000 mg | SUBCUTANEOUS | 1 refills | Status: AC

## 2024-10-17 MED ORDER — LOSARTAN POTASSIUM 100 MG PO TABS: 100.0000 mg | ORAL_TABLET | Freq: Every day | ORAL | 3 refills | Status: AC

## 2024-10-17 NOTE — Telephone Encounter (Signed)
 Pt switching to [employer]. I have sent in maintenance meds. Please send Mounjaro  and Temazepam 

## 2024-10-17 NOTE — Telephone Encounter (Signed)
 Mounjaro  sent , she has almost 3 months of temazepam  so not sent , will send her a msg to request refill when needed , was off it for a long time

## 2024-10-17 NOTE — Addendum Note (Signed)
 Addended by: ANTONETTA QUANT E on: 10/17/2024 11:32 AM   Modules accepted: Orders

## 2024-11-09 ENCOUNTER — Other Ambulatory Visit: Payer: Self-pay | Admitting: Family Medicine

## 2024-11-14 ENCOUNTER — Ambulatory Visit (INDEPENDENT_AMBULATORY_CARE_PROVIDER_SITE_OTHER): Admitting: Physician Assistant

## 2024-11-15 ENCOUNTER — Ambulatory Visit: Admitting: Family Medicine

## 2024-11-15 ENCOUNTER — Ambulatory Visit: Payer: Self-pay | Admitting: Family Medicine

## 2024-11-15 LAB — BMP8+EGFR
BUN/Creatinine Ratio: 13 (ref 12–28)
BUN: 12 mg/dL (ref 8–27)
CO2: 22 mmol/L (ref 20–29)
Calcium: 9.5 mg/dL (ref 8.7–10.3)
Chloride: 97 mmol/L (ref 96–106)
Creatinine, Ser: 0.9 mg/dL (ref 0.57–1.00)
Glucose: 150 mg/dL — ABNORMAL HIGH (ref 70–99)
Potassium: 4.4 mmol/L (ref 3.5–5.2)
Sodium: 134 mmol/L (ref 134–144)
eGFR: 71 mL/min/1.73

## 2024-11-15 LAB — HEMOGLOBIN A1C
Est. average glucose Bld gHb Est-mCnc: 154 mg/dL
Hgb A1c MFr Bld: 7 % — ABNORMAL HIGH (ref 4.8–5.6)

## 2024-12-02 ENCOUNTER — Other Ambulatory Visit: Payer: Self-pay | Admitting: Family Medicine

## 2024-12-06 ENCOUNTER — Ambulatory Visit (HOSPITAL_BASED_OUTPATIENT_CLINIC_OR_DEPARTMENT_OTHER)

## 2024-12-12 ENCOUNTER — Ambulatory Visit (HOSPITAL_BASED_OUTPATIENT_CLINIC_OR_DEPARTMENT_OTHER)
Admission: RE | Admit: 2024-12-12 | Discharge: 2024-12-12 | Disposition: A | Source: Ambulatory Visit | Attending: Acute Care | Admitting: Acute Care

## 2024-12-12 DIAGNOSIS — Z87891 Personal history of nicotine dependence: Secondary | ICD-10-CM

## 2024-12-12 DIAGNOSIS — Z122 Encounter for screening for malignant neoplasm of respiratory organs: Secondary | ICD-10-CM

## 2025-02-04 ENCOUNTER — Ambulatory Visit: Admitting: Family Medicine
